# Patient Record
Sex: Female | Born: 1975
Health system: Southern US, Community
[De-identification: ages and names within clinical notes are randomized; demographics above are authoritative.]

## PROBLEM LIST (undated history)

## (undated) ENCOUNTER — Inpatient Hospital Stay (HOSPITAL_COMMUNITY): Payer: Self-pay

## (undated) DIAGNOSIS — M2040 Other hammer toe(s) (acquired), unspecified foot: Secondary | ICD-10-CM

## (undated) DIAGNOSIS — R7303 Prediabetes: Secondary | ICD-10-CM

## (undated) DIAGNOSIS — F32A Depression, unspecified: Secondary | ICD-10-CM

## (undated) DIAGNOSIS — T7840XA Allergy, unspecified, initial encounter: Secondary | ICD-10-CM

## (undated) DIAGNOSIS — G5600 Carpal tunnel syndrome, unspecified upper limb: Secondary | ICD-10-CM

## (undated) DIAGNOSIS — M069 Rheumatoid arthritis, unspecified: Secondary | ICD-10-CM

## (undated) DIAGNOSIS — G709 Myoneural disorder, unspecified: Secondary | ICD-10-CM

## (undated) DIAGNOSIS — L600 Ingrowing nail: Secondary | ICD-10-CM

## (undated) DIAGNOSIS — M1990 Unspecified osteoarthritis, unspecified site: Secondary | ICD-10-CM

## (undated) DIAGNOSIS — K219 Gastro-esophageal reflux disease without esophagitis: Secondary | ICD-10-CM

## (undated) DIAGNOSIS — N390 Urinary tract infection, site not specified: Secondary | ICD-10-CM

## (undated) DIAGNOSIS — F909 Attention-deficit hyperactivity disorder, unspecified type: Secondary | ICD-10-CM

## (undated) DIAGNOSIS — F329 Major depressive disorder, single episode, unspecified: Secondary | ICD-10-CM

## (undated) DIAGNOSIS — F419 Anxiety disorder, unspecified: Secondary | ICD-10-CM

## (undated) DIAGNOSIS — F99 Mental disorder, not otherwise specified: Secondary | ICD-10-CM

## (undated) DIAGNOSIS — I10 Essential (primary) hypertension: Secondary | ICD-10-CM

## (undated) DIAGNOSIS — E785 Hyperlipidemia, unspecified: Secondary | ICD-10-CM

## (undated) DIAGNOSIS — O480 Post-term pregnancy: Secondary | ICD-10-CM

## (undated) DIAGNOSIS — E559 Vitamin D deficiency, unspecified: Secondary | ICD-10-CM

## (undated) HISTORY — DX: Vitamin D deficiency, unspecified: E55.9

## (undated) HISTORY — PX: BREAST SURGERY: SHX581

## (undated) HISTORY — DX: Hyperlipidemia, unspecified: E78.5

## (undated) HISTORY — PX: CORRECTION HAMMER TOE: SUR317

## (undated) HISTORY — PX: TOE SURGERY: SHX1073

## (undated) HISTORY — DX: Essential (primary) hypertension: I10

## (undated) HISTORY — DX: Attention-deficit hyperactivity disorder, unspecified type: F90.9

## (undated) HISTORY — PX: DILATION AND CURETTAGE OF UTERUS: SHX78

## (undated) HISTORY — DX: Gastro-esophageal reflux disease without esophagitis: K21.9

## (undated) HISTORY — DX: Urinary tract infection, site not specified: N39.0

## (undated) HISTORY — DX: Prediabetes: R73.03

## (undated) HISTORY — DX: Allergy, unspecified, initial encounter: T78.40XA

## (undated) HISTORY — PX: CARPAL TUNNEL RELEASE: SHX101

## (undated) HISTORY — DX: Rheumatoid arthritis, unspecified: M06.9

## (undated) HISTORY — PX: OTHER SURGICAL HISTORY: SHX169

## (undated) HISTORY — PX: WISDOM TOOTH EXTRACTION: SHX21

## (undated) HISTORY — PX: HYSTEROSALPINGOGRAM: SHX6581

---

## 1998-09-23 ENCOUNTER — Other Ambulatory Visit: Admission: RE | Admit: 1998-09-23 | Discharge: 1998-09-23 | Payer: Self-pay

## 1998-10-04 ENCOUNTER — Emergency Department (HOSPITAL_COMMUNITY): Admission: EM | Admit: 1998-10-04 | Discharge: 1998-10-04 | Payer: Self-pay

## 2000-09-11 ENCOUNTER — Other Ambulatory Visit: Admission: RE | Admit: 2000-09-11 | Discharge: 2000-09-11 | Payer: Self-pay | Admitting: Emergency Medicine

## 2002-01-01 HISTORY — PX: CRYOABLATION: SHX1415

## 2003-06-29 ENCOUNTER — Other Ambulatory Visit: Admission: RE | Admit: 2003-06-29 | Discharge: 2003-06-29 | Payer: Self-pay | Admitting: Gynecology

## 2004-06-20 ENCOUNTER — Ambulatory Visit: Payer: Self-pay | Admitting: Internal Medicine

## 2004-07-28 ENCOUNTER — Ambulatory Visit (HOSPITAL_BASED_OUTPATIENT_CLINIC_OR_DEPARTMENT_OTHER): Admission: RE | Admit: 2004-07-28 | Discharge: 2004-07-28 | Payer: Self-pay | Admitting: Urology

## 2004-07-28 ENCOUNTER — Encounter (INDEPENDENT_AMBULATORY_CARE_PROVIDER_SITE_OTHER): Payer: Self-pay | Admitting: Specialist

## 2004-07-28 ENCOUNTER — Ambulatory Visit (HOSPITAL_COMMUNITY): Admission: RE | Admit: 2004-07-28 | Discharge: 2004-07-28 | Payer: Self-pay | Admitting: Urology

## 2004-07-28 HISTORY — PX: CYSTOSCOPY WITH HYDRODISTENSION AND BIOPSY: SHX5127

## 2005-01-19 ENCOUNTER — Other Ambulatory Visit: Admission: RE | Admit: 2005-01-19 | Discharge: 2005-01-19 | Payer: Self-pay | Admitting: Gynecology

## 2006-02-15 ENCOUNTER — Other Ambulatory Visit: Admission: RE | Admit: 2006-02-15 | Discharge: 2006-02-15 | Payer: Self-pay | Admitting: Gynecology

## 2007-01-20 ENCOUNTER — Encounter: Admission: RE | Admit: 2007-01-20 | Discharge: 2007-01-20 | Payer: Self-pay | Admitting: Emergency Medicine

## 2007-04-04 ENCOUNTER — Other Ambulatory Visit: Admission: RE | Admit: 2007-04-04 | Discharge: 2007-04-04 | Payer: Self-pay | Admitting: Gynecology

## 2007-04-25 ENCOUNTER — Encounter: Admission: RE | Admit: 2007-04-25 | Discharge: 2007-04-25 | Payer: Self-pay | Admitting: Gynecology

## 2007-08-21 ENCOUNTER — Emergency Department (HOSPITAL_COMMUNITY): Admission: EM | Admit: 2007-08-21 | Discharge: 2007-08-21 | Payer: Self-pay | Admitting: Emergency Medicine

## 2009-01-06 ENCOUNTER — Other Ambulatory Visit: Admission: RE | Admit: 2009-01-06 | Discharge: 2009-01-06 | Payer: Self-pay | Admitting: Gynecology

## 2009-01-06 ENCOUNTER — Ambulatory Visit: Payer: Self-pay | Admitting: Gynecology

## 2009-02-04 ENCOUNTER — Ambulatory Visit: Payer: Self-pay | Admitting: Gynecology

## 2009-03-10 ENCOUNTER — Ambulatory Visit: Payer: Self-pay | Admitting: Women's Health

## 2009-03-14 ENCOUNTER — Ambulatory Visit (HOSPITAL_COMMUNITY): Admission: AD | Admit: 2009-03-14 | Discharge: 2009-03-14 | Payer: Self-pay | Admitting: Gynecology

## 2009-03-14 ENCOUNTER — Ambulatory Visit: Payer: Self-pay | Admitting: Gynecology

## 2009-03-17 ENCOUNTER — Inpatient Hospital Stay (HOSPITAL_COMMUNITY): Admission: AD | Admit: 2009-03-17 | Discharge: 2009-03-17 | Payer: Self-pay | Admitting: Obstetrics and Gynecology

## 2009-03-21 ENCOUNTER — Inpatient Hospital Stay (HOSPITAL_COMMUNITY): Admission: AD | Admit: 2009-03-21 | Discharge: 2009-03-21 | Payer: Self-pay | Admitting: Obstetrics and Gynecology

## 2009-03-25 ENCOUNTER — Inpatient Hospital Stay (HOSPITAL_COMMUNITY): Admission: AD | Admit: 2009-03-25 | Discharge: 2009-03-25 | Payer: Self-pay | Admitting: Obstetrics and Gynecology

## 2009-03-26 ENCOUNTER — Inpatient Hospital Stay (HOSPITAL_COMMUNITY): Admission: AD | Admit: 2009-03-26 | Discharge: 2009-03-26 | Payer: Self-pay | Admitting: Obstetrics and Gynecology

## 2009-10-20 ENCOUNTER — Ambulatory Visit: Payer: Self-pay | Admitting: Gynecology

## 2009-11-04 ENCOUNTER — Ambulatory Visit (HOSPITAL_COMMUNITY)
Admission: RE | Admit: 2009-11-04 | Discharge: 2009-11-04 | Payer: Self-pay | Source: Home / Self Care | Admitting: Gynecology

## 2009-11-15 ENCOUNTER — Ambulatory Visit: Payer: Self-pay | Admitting: Gynecology

## 2009-12-09 ENCOUNTER — Ambulatory Visit: Payer: Self-pay | Admitting: Gynecology

## 2010-01-22 ENCOUNTER — Encounter: Payer: Self-pay | Admitting: Gynecology

## 2010-03-27 LAB — WET PREP, GENITAL
Clue Cells Wet Prep HPF POC: NONE SEEN
Trich, Wet Prep: NONE SEEN
Yeast Wet Prep HPF POC: NONE SEEN

## 2010-03-27 LAB — RH IMMUNE GLOBULIN WORKUP (NOT WOMEN'S HOSP)
ABO/RH(D): O NEG
Antibody Screen: NEGATIVE

## 2010-03-27 LAB — CBC
HCT: 36.3 % (ref 36.0–46.0)
HCT: 39.8 % (ref 36.0–46.0)
Hemoglobin: 12.4 g/dL (ref 12.0–15.0)
Hemoglobin: 13.6 g/dL (ref 12.0–15.0)
MCHC: 34.3 g/dL (ref 30.0–36.0)
MCHC: 34.3 g/dL (ref 30.0–36.0)
MCV: 94.1 fL (ref 78.0–100.0)
MCV: 94.6 fL (ref 78.0–100.0)
Platelets: 263 10*3/uL (ref 150–400)
Platelets: 275 10*3/uL (ref 150–400)
RBC: 3.85 MIL/uL — ABNORMAL LOW (ref 3.87–5.11)
RBC: 4.21 MIL/uL (ref 3.87–5.11)
RDW: 13 % (ref 11.5–15.5)
RDW: 13 % (ref 11.5–15.5)
WBC: 7.4 10*3/uL (ref 4.0–10.5)
WBC: 9.2 10*3/uL (ref 4.0–10.5)

## 2010-03-27 LAB — HCG, QUANTITATIVE, PREGNANCY
hCG, Beta Chain, Quant, S: 150 m[IU]/mL — ABNORMAL HIGH (ref <5)
hCG, Beta Chain, Quant, S: 299 m[IU]/mL — ABNORMAL HIGH (ref <5)
hCG, Beta Chain, Quant, S: 437 m[IU]/mL — ABNORMAL HIGH (ref <5)

## 2010-03-27 LAB — GC/CHLAMYDIA PROBE AMP, GENITAL
Chlamydia, DNA Probe: NEGATIVE
GC Probe Amp, Genital: NEGATIVE

## 2010-03-27 LAB — ABO/RH: ABO/RH(D): O NEG

## 2010-04-12 ENCOUNTER — Other Ambulatory Visit (HOSPITAL_COMMUNITY)
Admission: RE | Admit: 2010-04-12 | Discharge: 2010-04-12 | Disposition: A | Discharge status: Home / Self Care | Payer: PRIVATE HEALTH INSURANCE | Source: Ambulatory Visit | Attending: Gynecology | Admitting: Gynecology

## 2010-04-12 ENCOUNTER — Other Ambulatory Visit: Payer: Self-pay | Admitting: Gynecology

## 2010-04-12 ENCOUNTER — Encounter (INDEPENDENT_AMBULATORY_CARE_PROVIDER_SITE_OTHER): Payer: PRIVATE HEALTH INSURANCE | Admitting: Gynecology

## 2010-04-12 DIAGNOSIS — Z124 Encounter for screening for malignant neoplasm of cervix: Secondary | ICD-10-CM | POA: Insufficient documentation

## 2010-04-12 DIAGNOSIS — Z01419 Encounter for gynecological examination (general) (routine) without abnormal findings: Secondary | ICD-10-CM

## 2010-04-12 DIAGNOSIS — N949 Unspecified condition associated with female genital organs and menstrual cycle: Secondary | ICD-10-CM

## 2010-04-12 DIAGNOSIS — Z113 Encounter for screening for infections with a predominantly sexual mode of transmission: Secondary | ICD-10-CM

## 2010-04-12 DIAGNOSIS — R635 Abnormal weight gain: Secondary | ICD-10-CM

## 2010-04-12 DIAGNOSIS — Z13220 Encounter for screening for lipoid disorders: Secondary | ICD-10-CM

## 2010-04-12 DIAGNOSIS — Z833 Family history of diabetes mellitus: Secondary | ICD-10-CM

## 2010-04-12 DIAGNOSIS — R823 Hemoglobinuria: Secondary | ICD-10-CM

## 2010-05-09 ENCOUNTER — Other Ambulatory Visit: Payer: PRIVATE HEALTH INSURANCE

## 2010-05-19 NOTE — Op Note (Signed)
Melissa [name], Melissa [name]             ACCOUNT NO.:  [id number]   MEDICAL RECORD NO.:  [id number]          PATIENT TYPE:  AMB   LOCATION:  [facility]                         FACILITY:  [facility]   PHYSICIAN:  Mark C. Vernie Ammons, M.D.  DATE OF BIRTH:  1975/01/31   DATE OF PROCEDURE:  07/28/2004  DATE OF DISCHARGE:                                 OPERATIVE REPORT   PREOPERATIVE DIAGNOSIS:  Urinary frequency, rule out interstitial cystitis.   POSTOPERATIVE DIAGNOSIS:  Urinary frequency, rule out interstitial cystitis.   PROCEDURE:  Cystoscopy with urethral dilatation, hydrodistention and bladder  biopsy.   SURGEON:  Mark C. Vernie Ammons, M.D.   ANESTHESIA:  General.   BLOOD LOSS:  Minimal.   DRAINS:  None.   SPECIMENS:  Cold cup bladder biopsy to pathology.   COMPLICATIONS:  None.   INDICATIONS:  The patient is a 35 year old white female with a chief  complaint of frequent urination that has been occurring approximately every  five years. She says when her bladder is full, it is uncomfortable and she  has some discomfort in the suprapubic region and improves with urination.  She had nocturia 2-3 times. She had a puff score of 17 and also painless  microscopic hematuria. A CT scan revealed normal upper tracts and a negative  cytology. She has irritative symptoms and is a smoker and also needs to have  intravesical lesions ruled out. The risks, complications and alternatives  were discussed. She understands and elected to proceed.   DESCRIPTION OF OPERATION:  After informed consent, the patient was brought  to the major OR, placed on the table, administered general anesthesia then  moved to the dorsal lithotomy position. Her genitalia was sterilely prepped  and draped. I then performed urethral dilatation with the female sounds to  30-French. I then inserted the 21-French cystoscope, fully inspected the  bladder and noted it to be free of any tumor, stones or inflammatory  lesions. Ureteral  orifices were normal in configuration and position.   The bladder was then filled to capacity at 100 cmH2O pressure. I then  drained the bladder, the terminal effluent was clear, the bladder capacity  was 750 mL under anesthesia. I then placed a 16-French Foley catheter in the  bladder and redistended her to 100 cmH2O water pressure, left her bladder  filled at that pressure for approximately five minutes and then drained the  bladder.   I reinserted the cystoscope with the cold cup biopsy forceps and obtained a  biopsy from the posterior wall of the bladder. There were virtually no  petechial hemorrhages and minimal glomerulations noted. After the cold cup  biopsy was obtained, the Bugbee electrode was used to fulgurated the biopsy  site and the bladder was then fully drained.   A bimanual exam was performed. I noted no masses or other abnormality. Her  contraceptive hormone ring was felt in the posterior fornix. I then  instilled Marcaine and Pyridium in the bladder and injected Kenalog and  Marcaine periurethrally. A vaginal packing was inserted and the patient was  awakened and taken to recovery room in stable satisfactory condition.  She  tolerated the procedure well with no intraoperative complications.   She will be given a prescription for Vicodin E.S.(#30) and will return to my  office in four weeks for recheck.       MCO/MEDQ  D:  07/28/2004  T:  07/28/2004  Job:  [id number]

## 2010-07-28 ENCOUNTER — Telehealth: Payer: Self-pay | Admitting: *Deleted

## 2010-07-28 ENCOUNTER — Other Ambulatory Visit: Payer: Self-pay | Admitting: *Deleted

## 2010-07-28 DIAGNOSIS — N979 Female infertility, unspecified: Secondary | ICD-10-CM

## 2010-07-28 MED ORDER — CLOMIPHENE CITRATE 50 MG PO TABS: 50.0000 mg | ORAL_TABLET | Freq: Every day | ORAL | Status: AC

## 2010-07-28 NOTE — Telephone Encounter (Signed)
PT CALLED WANTING A REFILL ON AN RX THAT HER DERMATOLOGIST GAVE HER GLYCOPYRROLATE. I SUGGESTED THAT REFILL BE DONE BY DR.LUPTON BEING THAT HE PRESCRIBED THE RX.

## 2010-07-28 NOTE — Telephone Encounter (Signed)
Patient called with LMP 07/01/10, and using Clomid50mg  D5-9. Requests RF. Due for period anyday. Please advise.

## 2010-07-31 NOTE — Telephone Encounter (Signed)
Pt informed rx sent and she stated she started her period today 07/31/10.

## 2010-08-31 ENCOUNTER — Telehealth: Payer: Self-pay | Admitting: *Deleted

## 2010-08-31 NOTE — Telephone Encounter (Signed)
PT CALLED AND LEFT MESSAGE STATING SHE NEED TO KNOW HOW TO TAKE A MEDICATION? LM FOR PT TO CALL BACK.

## 2010-09-01 ENCOUNTER — Encounter: Payer: Self-pay | Admitting: *Deleted

## 2010-09-01 DIAGNOSIS — A549 Gonococcal infection, unspecified: Secondary | ICD-10-CM | POA: Insufficient documentation

## 2010-09-01 NOTE — Telephone Encounter (Signed)
AMY SPOKE TO PATIENT ABOUT CLOMID MEDICATION.

## 2010-09-05 ENCOUNTER — Encounter: Payer: Self-pay | Admitting: Gynecology

## 2010-09-05 ENCOUNTER — Ambulatory Visit (INDEPENDENT_AMBULATORY_CARE_PROVIDER_SITE_OTHER): Payer: PRIVATE HEALTH INSURANCE | Admitting: Gynecology

## 2010-09-05 VITALS — BP 118/70

## 2010-09-05 DIAGNOSIS — E789 Disorder of lipoprotein metabolism, unspecified: Secondary | ICD-10-CM

## 2010-09-05 DIAGNOSIS — B373 Candidiasis of vulva and vagina: Secondary | ICD-10-CM

## 2010-09-05 DIAGNOSIS — Z3169 Encounter for other general counseling and advice on procreation: Secondary | ICD-10-CM

## 2010-09-05 DIAGNOSIS — B3731 Acute candidiasis of vulva and vagina: Secondary | ICD-10-CM

## 2010-09-05 DIAGNOSIS — R61 Generalized hyperhidrosis: Secondary | ICD-10-CM

## 2010-09-05 DIAGNOSIS — N760 Acute vaginitis: Secondary | ICD-10-CM

## 2010-09-05 DIAGNOSIS — B9689 Other specified bacterial agents as the cause of diseases classified elsewhere: Secondary | ICD-10-CM

## 2010-09-05 DIAGNOSIS — A499 Bacterial infection, unspecified: Secondary | ICD-10-CM

## 2010-09-05 DIAGNOSIS — N898 Other specified noninflammatory disorders of vagina: Secondary | ICD-10-CM

## 2010-09-05 MED ORDER — CLINDAMYCIN PHOSPHATE 2 % VA CREA: 1.0000 | TOPICAL_CREAM | Freq: Every day | VAGINAL | Status: AC

## 2010-09-05 MED ORDER — FLUCONAZOLE 150 MG PO TABS: 150.0000 mg | ORAL_TABLET | Freq: Once | ORAL | Status: AC

## 2010-09-05 NOTE — Progress Notes (Deleted)
Lindsi here is the protocol that I want you to follow to help you  conceive as we had discussed in the office. Remember most important that the first day you start spotting that is considered the first day of your cycle and this is the day that all counting begins for the rest of the month.  First day of spotting isn't day #1 of cycle From day 5 through 9 take one Clomid tablet daily From day 12 through 16 you going to check your urine with the ovulation predictor kit twice a day the time your intercourse. When you noticed a change on the ovulation predictor kit this means that your going to ovulate in the next 24 hours which means that you are fertile during this time. In for you to have intercourse that night and the next 2 nights.  Remember to continue to take her prenatal vitamins daily as well.

## 2010-09-05 NOTE — Progress Notes (Signed)
Patient presented to the office today complaining of a vaginal yellowish-like discharge. She is in a monogamous relationship. She had a GC and Chlamydia culture done a few months ago here in the office which were negative. She has also been trying to conceive is currently on clomiphene citrate 50 mg a day 5 through 9. Patient had questions. It appears that she is not timing her intercourse or the medication or of the utilization of the ovulation predictor kit at the appropriate times has previously had been indicated. Patient also had elevated cholesterol with a value of 201 a few months ago and did not return for a fasting lipid profile until today. She also has had some night sweats at times on and off in weight gain.  Pelvic exam: Bartholin urethra Skene was within normal limits Vagina: Whitish-like discharge present Cervix: White-like discharge Bimanual exam not done  Wet prep: Moniliasis and bacterial vaginosis.  Assessment: Bacterial vaginosis and moniliasis. Weight gain, night sweats,  Plan: Cleocin vaginal cream to apply each bedtime for 5 days, Diflucan 150 mg to take one by mouth today. Fasting lipid profile and TSH today. We'll notify patient there is any abnormality. She is to follow the instructions given to her as to the timing of intercourse with prior utilization of the ovulation predictor kit as well as the clomiphene citrate. Patient fully understands the risk of the ovulation induction medication to include multiple gestation and ovarian hyperstimulation had been discussed.

## 2010-09-05 NOTE — Patient Instructions (Signed)
Tori, here R. the instructions that we discussed in the office that I would like for you to follow to help you conceive.  The first day of her cycle is considered the first date that you are spotting. Everything is dependent on this first day and will continue to count forward from this day on.  Day 5 through 9 he will take one Clomid tablet daily  From day 12 through 16 you we'll use the ovulation predictor kit in order time your intercourse. If he noticed a change on the ovulation predictor kit this means that you we'll be ovulating within the next 24 hours so you in her partner should have intercourse that day and the next couple of days.  Remember to continue to take her prenatal vitamins daily.  Will notify you about into the week if there is any abnormality on the lipid profile and your thyroid function tests that were obtained today.

## 2010-09-06 ENCOUNTER — Telehealth: Payer: Self-pay | Admitting: *Deleted

## 2010-09-06 NOTE — Telephone Encounter (Signed)
PT CALLED STATING SHE LOST RX GIVEN ON 09/05/10.

## 2010-09-07 NOTE — Telephone Encounter (Signed)
PT CALLED STATING SHE LOST HER 2 RX FROM OFFICE VISIT 09/05/10. I CALLED PHARMACY TO TELL THIS AND WAS INFORMED THAT PT WOULD HAVE TO PAY FULL PRICE FOR RX, INSURANCE WOULDN'T PAY FOR MEDICATION TWICE. LM ON PT VOICE MAIL WITH THIS INFORMATION AND FOR PT TO CALL BACK.

## 2010-09-18 ENCOUNTER — Telehealth: Payer: Self-pay | Admitting: *Deleted

## 2010-09-18 DIAGNOSIS — N390 Urinary tract infection, site not specified: Secondary | ICD-10-CM

## 2010-09-18 MED ORDER — NITROFURANTOIN MONOHYD MACRO 100 MG PO CAPS: 100.0000 mg | ORAL_CAPSULE | Freq: Two times a day (BID) | ORAL | Status: AC

## 2010-09-18 NOTE — Telephone Encounter (Signed)
Pt called stating she lost her both medication for office visit 09/05/10 diflucan and cleocin. Pt states it fell out of the car. The pharmacy said that insurance will not pay for medication again, unless we call in rx for next month. Pt also complaining of UTI frequent urination. No burning or lower back pain. LMP:08/30/10. No hysterectomy. Please advise

## 2010-09-18 NOTE — Telephone Encounter (Signed)
Message copied by Aura Camps on Mon Sep 18, 2010  3:10 PM ------      Message from: Ok Edwards      Created: Mon Sep 18, 2010  2:46 PM       Victorino Dike, please call in a prescription for Cleocin vaginal cream to apply each bedtime for 5 days. Also Diflucan 150 mg one tablet by mouth. If her urinary tract symptoms she can take Macrobid 1 tablet by mouth twice a day for 7 days. If no improvement with her symptoms of she develops any fever chills nausea vomiting or flank pain she should report to the office or after hours to the emergency room.

## 2010-09-18 NOTE — Telephone Encounter (Signed)
LM ON PT VOICE MAIL WITH THE BELOW NOTE. TOLD PT TO CALL TO SEE HOW WE SHOULD GO ABOUT THE CLEOCIN VAGINAL CREAM AND DIFLUCAN.

## 2010-11-07 ENCOUNTER — Telehealth: Payer: Self-pay | Admitting: *Deleted

## 2010-11-07 DIAGNOSIS — B3731 Acute candidiasis of vulva and vagina: Secondary | ICD-10-CM

## 2010-11-07 DIAGNOSIS — B9689 Other specified bacterial agents as the cause of diseases classified elsewhere: Secondary | ICD-10-CM

## 2010-11-07 DIAGNOSIS — N760 Acute vaginitis: Secondary | ICD-10-CM

## 2010-11-07 DIAGNOSIS — B373 Candidiasis of vulva and vagina: Secondary | ICD-10-CM

## 2010-11-07 MED ORDER — FLUCONAZOLE 150 MG PO TABS: 150.0000 mg | ORAL_TABLET | Freq: Once | ORAL | Status: AC

## 2010-11-07 MED ORDER — CLINDAMYCIN PHOSPHATE 2 % VA CREA: TOPICAL_CREAM | VAGINAL | Status: DC

## 2010-11-07 NOTE — Telephone Encounter (Signed)
Pt wanting rx called in to [facility] for her two rx cleocin vaginal cream x 5days and diflucan 150mg  1 pill 2 refill. Per jf this is okay to call in on last encounter 09/06/10.

## 2010-12-27 ENCOUNTER — Ambulatory Visit (INDEPENDENT_AMBULATORY_CARE_PROVIDER_SITE_OTHER): Payer: PRIVATE HEALTH INSURANCE | Admitting: Women's Health

## 2010-12-27 ENCOUNTER — Encounter: Payer: Self-pay | Admitting: Women's Health

## 2010-12-27 DIAGNOSIS — N898 Other specified noninflammatory disorders of vagina: Secondary | ICD-10-CM

## 2010-12-27 DIAGNOSIS — B3731 Acute candidiasis of vulva and vagina: Secondary | ICD-10-CM

## 2010-12-27 DIAGNOSIS — B373 Candidiasis of vulva and vagina: Secondary | ICD-10-CM

## 2010-12-27 MED ORDER — FLUCONAZOLE 150 MG PO TABS: 150.0000 mg | ORAL_TABLET | Freq: Once | ORAL | Status: AC

## 2010-12-27 NOTE — Progress Notes (Signed)
Patient ID: Melissa [name], female   DOB: Dec 18, 1975, 35 y.o.   MRN: [id number] Presents with a complaint of vaginal discharge with itching. Also is desiring pregnancy. Last cycle was on December 11, normal cycle. Has used Clomid in the past without success. Has used Diflucan in the past but states needs more than 1 tablet.   Exam: External genitalia is within normal limits, speculum exam cervix is pink without lesion or discharge. Scant white adherent discharge present. Bimanual uterus is small nontender no CMT. Wet prep positive for yeast.  Yeast vaginitis  Plan: Diflucan 150 by mouth daily for 3 days. Yeast prevention discussed.  Reviewed ovulation, need to increased frequency of intercourse. Return to office with missed cycle or to discuss plans for fertility management with Dr. Lily Peer.

## 2010-12-29 ENCOUNTER — Telehealth: Payer: Self-pay | Admitting: *Deleted

## 2010-12-29 MED ORDER — TERCONAZOLE 0.4 % VA CREA: 1.0000 | TOPICAL_CREAM | Freq: Every day | VAGINAL | Status: AC

## 2010-12-29 NOTE — Telephone Encounter (Signed)
Pt called c/o possible UTI s/s x 2 days now. Pt has only burning with urination, no lower back back pain nor other s/s. Pt was seen on 12/27/10 for yeast and given diflucan to take. Please advise

## 2010-12-29 NOTE — Telephone Encounter (Signed)
rx sent to pharmacy

## 2010-12-29 NOTE — Telephone Encounter (Signed)
Telephone call to patient. States most of her discomfort is  at initiation of urination, no pain at the end of the stream. Mostly burning on skin. Will try  Terazol 7 one applicator at bedtime x7 prescription will be called in. Instructed to call if symptoms persist.  [name].- please call in Terazol 7 one applicator at bedtime x7 dispense one tube, generic is okay.

## 2011-01-19 ENCOUNTER — Ambulatory Visit (INDEPENDENT_AMBULATORY_CARE_PROVIDER_SITE_OTHER): Payer: PRIVATE HEALTH INSURANCE | Admitting: Gynecology

## 2011-01-19 ENCOUNTER — Encounter: Payer: Self-pay | Admitting: Gynecology

## 2011-01-19 VITALS — BP 120/82

## 2011-01-19 DIAGNOSIS — N938 Other specified abnormal uterine and vaginal bleeding: Secondary | ICD-10-CM

## 2011-01-19 DIAGNOSIS — R635 Abnormal weight gain: Secondary | ICD-10-CM

## 2011-01-19 DIAGNOSIS — N949 Unspecified condition associated with female genital organs and menstrual cycle: Secondary | ICD-10-CM

## 2011-01-19 DIAGNOSIS — N898 Other specified noninflammatory disorders of vagina: Secondary | ICD-10-CM

## 2011-01-19 DIAGNOSIS — R61 Generalized hyperhidrosis: Secondary | ICD-10-CM

## 2011-01-19 DIAGNOSIS — Z3201 Encounter for pregnancy test, result positive: Secondary | ICD-10-CM

## 2011-01-19 LAB — WET PREP FOR TRICH, YEAST, CLUE
Clue Cells Wet Prep HPF POC: NONE SEEN
Trich, Wet Prep: NONE SEEN
Yeast Wet Prep HPF POC: NONE SEEN

## 2011-01-19 LAB — PREGNANCY, URINE: Preg Test, Ur: POSITIVE

## 2011-01-19 NOTE — Patient Instructions (Signed)
Threatened Miscarriage Bleeding during the first 20 weeks of pregnancy is common. This is sometimes called a threatened miscarriage. This is a pregnancy that is threatening to end before the twentieth week of pregnancy. Often this bleeding stops with bed rest or decreased activities as suggested by your caregiver and the pregnancy continues without any more problems. You may be asked to not have sexual intercourse, have orgasms or use tampons until further notice. Sometimes a threatened miscarriage can progress to a complete or incomplete miscarriage. This may or may not require further treatment. Some miscarriages occur before a woman misses a menstrual period and knows she is pregnant. Miscarriages occur in 15 to 20% of all pregnancies and usually occur during the first 13 weeks of the pregnancy. The exact cause of a miscarriage is usually never known. A miscarriage is natures way of ending a pregnancy that is abnormal or would not make it to term. There are some things that may put you at risk to have a miscarriage, such as:  Hormone problems.   Infection of the uterus or cervix.   Chronic illness, diabetes for example, especially if it is not controlled.   Abnormal shaped uterus.   Fibroids in the uterus.   Incompetent cervix (the cervix is too weak to hold the baby).   Smoking.   Drinking too much alcohol. It's best not to drink any alcohol when you are pregnant.   Taking illegal drugs.  TREATMENT  When a miscarriage becomes complete and all products of conception (all the tissue in the uterus) have been passed, often no treatment is needed. If you think you passed tissue, save it in a container and take it to your doctor for evaluation. If the miscarriage is incomplete (parts of the fetus or placenta remain in the uterus), further treatment may be needed. The most common reason for further treatment is continued bleeding (hemorrhage) because pregnancy tissue did not pass out of the  uterus. This often occurs if a miscarriage is incomplete. Tissue left behind may also become infected. Treatment usually is dilatation and curettage (the removal of the remaining products of pregnancy. This can be done by a simple sucking procedure (suction curettage) or a simple scraping of the inside of the uterus. This may be done in the hospital or in the caregiver's office. This is only done when your caregiver knows that there is no chance for the pregnancy to proceed to term. This is determined by physical examination, negative pregnancy test, falling pregnancy hormone count and/or, an ultrasound revealing a dead fetus. Miscarriages are often a very emotional time for prospective mothers and fathers. This is not you or your partners fault. It did not occur because of an inadequacy in you or your partner. Nearly all miscarriages occur because the pregnancy has started off wrongly. At least half of these pregnancies have a chromosomal abnormality. It is almost always not inherited. Others may have developmental problems with the fetus or placenta. This does not always show up even when the products miscarried are studied under the microscope. The miscarriage is nearly always not your fault and it is not likely that you could have prevented it from happening. If you are having emotional and grieving problems, talk to your health care provider and even seek counseling, if necessary, before getting pregnant again. You can begin trying for another pregnancy as soon as your caregiver says it is OK. HOME CARE INSTRUCTIONS   Your caregiver may order bed rest depending on how much bleeding   and cramping you are having. You may be limited to only getting up to go to the bathroom. You may be allowed to continue light activity. You may need to make arrangements for the care of your other children and for any other responsibilities.   Keep track of the number of pads you use each day, how often you have to change pads  and how saturated (soaked) they are. Record this information.   DO NOT USE TAMPONS. Do not douche, have sexual intercourse or orgasms until approved by your caregiver.   You may receive a follow up appointment for re-evaluation of your pregnancy and a repeat blood test. Re-evaluation often occurs after 2 days and again in 4 to 6 weeks. It is very important that you follow-up in the recommended time period.   If you are Rh negative and the father is Rh positive or you do not know the fathers' blood type, you may receive a shot (Rh immune globulin) to help prevent abnormal antibodies that can develop and affect the baby in any future pregnancies.  SEEK IMMEDIATE MEDICAL CARE IF:  You have severe cramps in your stomach, back, or abdomen.   You have a sudden onset of severe pain in the lower part of your abdomen.   You develop chills.   You run an unexplained temperature of 101 F (38.3 C) or higher.   You pass large clots or tissue. Save any tissue for your caregiver to inspect.   Your bleeding increases or you become light-headed, weak, or have fainting episodes.   You have a gush of fluid from your vagina.   You pass out. This could mean you have a tubal (ectopic) pregnancy.  Document Released: 12/18/2004 Document Revised: 08/30/2010 Document Reviewed: 08/04/2007 [employer] Patient Information 2012 [employer]. 

## 2011-01-19 NOTE — Progress Notes (Signed)
Patient is a 36 year old gravida 2 para 0 Ab2 (1 elective termination, one spontaneous AB first trimester) who presented to the office today stating that for the past 2 months she's been having night sweats. She stated that November 1 last normal menstrual cycle because in December she spotted for approximately 8 days in January she spotted for 10 days. She had some mild nausea but no breast tenderness no vomiting reported. She's not been using any form of contraception and is currently on prenatal vitamins. No change in sexual partner. Patient with past history of GC in February 2008.  Exam: Abdomen: Soft nontender no rebound or guarding Bartholin urethra Skene glands: Within normal limits Vagina: Old dark blood was present Cervix: No lesions or discharge or any active bleeding Uterus: 6-8 weeks size nontender Adnexa: No palpable masses or tenderness Rectal: Not done  Urine pregnancy test positive Wet prep negative  Patient has informed me that she drinks one beer daily but denies any use of drugs. She does administrative work. She denies smoking. Past history of positive GC in 2008 treated. We discussed that she is high-risk for ectopic pregnancy and for this reason we are going to get a quantitative beta-hCG today and to repeat in 72 hours (Monday) and proceed with ultrasound based on results. She was counseled to stop drinking and to continue to take her prenatal vitamins. Literature information on threatened AB provided. She should refrain from any strenuous activity and intercourse until further notice.

## 2011-01-20 LAB — HCG, QUANTITATIVE, PREGNANCY: hCG, Beta Chain, Quant, S: 1854.7 m[IU]/mL

## 2011-01-22 ENCOUNTER — Encounter (HOSPITAL_COMMUNITY): Payer: Self-pay | Admitting: *Deleted

## 2011-01-22 ENCOUNTER — Other Ambulatory Visit: Payer: Self-pay | Admitting: Gynecology

## 2011-01-22 ENCOUNTER — Other Ambulatory Visit: Payer: PRIVATE HEALTH INSURANCE

## 2011-01-22 ENCOUNTER — Inpatient Hospital Stay (HOSPITAL_COMMUNITY)
Admission: AD | Admit: 2011-01-22 | Discharge: 2011-01-22 | Disposition: A | Discharge status: Home / Self Care | Payer: PRIVATE HEALTH INSURANCE | Source: Ambulatory Visit | Attending: Obstetrics & Gynecology | Admitting: Obstetrics & Gynecology

## 2011-01-22 ENCOUNTER — Ambulatory Visit (INDEPENDENT_AMBULATORY_CARE_PROVIDER_SITE_OTHER): Payer: PRIVATE HEALTH INSURANCE

## 2011-01-22 ENCOUNTER — Ambulatory Visit (INDEPENDENT_AMBULATORY_CARE_PROVIDER_SITE_OTHER): Payer: PRIVATE HEALTH INSURANCE | Admitting: Gynecology

## 2011-01-22 DIAGNOSIS — D3910 Neoplasm of uncertain behavior of unspecified ovary: Secondary | ICD-10-CM

## 2011-01-22 DIAGNOSIS — O200 Threatened abortion: Secondary | ICD-10-CM

## 2011-01-22 DIAGNOSIS — R1903 Right lower quadrant abdominal swelling, mass and lump: Secondary | ICD-10-CM

## 2011-01-22 DIAGNOSIS — O9989 Other specified diseases and conditions complicating pregnancy, childbirth and the puerperium: Secondary | ICD-10-CM

## 2011-01-22 DIAGNOSIS — Z3201 Encounter for pregnancy test, result positive: Secondary | ICD-10-CM

## 2011-01-22 DIAGNOSIS — O00109 Unspecified tubal pregnancy without intrauterine pregnancy: Secondary | ICD-10-CM | POA: Insufficient documentation

## 2011-01-22 DIAGNOSIS — O365990 Maternal care for other known or suspected poor fetal growth, unspecified trimester, not applicable or unspecified: Secondary | ICD-10-CM

## 2011-01-22 DIAGNOSIS — O009 Unspecified ectopic pregnancy without intrauterine pregnancy: Secondary | ICD-10-CM

## 2011-01-22 DIAGNOSIS — R300 Dysuria: Secondary | ICD-10-CM

## 2011-01-22 LAB — US OB TRANSVAGINAL

## 2011-01-22 LAB — RH IG WORKUP (INCLUDES ABO/RH)
ABO/RH(D): O NEG
Antibody Screen: NEGATIVE
Gestational Age(Wks): 6
Unit division: 0

## 2011-01-22 LAB — DIFFERENTIAL
Basophils Absolute: 0 10*3/uL (ref 0.0–0.1)
Basophils Relative: 0 % (ref 0–1)
Eosinophils Absolute: 0.1 10*3/uL (ref 0.0–0.7)
Eosinophils Relative: 1 % (ref 0–5)
Lymphocytes Relative: 27 % (ref 12–46)
Lymphs Abs: 1.9 10*3/uL (ref 0.7–4.0)
Monocytes Absolute: 0.6 10*3/uL (ref 0.1–1.0)
Monocytes Relative: 8 % (ref 3–12)
Neutro Abs: 4.4 10*3/uL (ref 1.7–7.7)
Neutrophils Relative %: 63 % (ref 43–77)

## 2011-01-22 LAB — HCG, QUANTITATIVE, PREGNANCY: hCG, Beta Chain, Quant, S: 5805.3 m[IU]/mL

## 2011-01-22 LAB — URINALYSIS W MICROSCOPIC + REFLEX CULTURE
Bilirubin Urine: NEGATIVE
Casts: NONE SEEN
Crystals: NONE SEEN
Glucose, UA: NEGATIVE mg/dL
Ketones, ur: NEGATIVE mg/dL
Leukocytes, UA: NEGATIVE
Nitrite: NEGATIVE
Protein, ur: NEGATIVE mg/dL
Specific Gravity, Urine: 1.02 (ref 1.005–1.030)
Urobilinogen, UA: 0.2 mg/dL (ref 0.0–1.0)
pH: 6 (ref 5.0–8.0)

## 2011-01-22 LAB — CBC
HCT: 41.4 % (ref 36.0–46.0)
Hemoglobin: 14.7 g/dL (ref 12.0–15.0)
MCH: 31.6 pg (ref 26.0–34.0)
MCHC: 35.5 g/dL (ref 30.0–36.0)
MCV: 89 fL (ref 78.0–100.0)
Platelets: 322 10*3/uL (ref 150–400)
RBC: 4.65 MIL/uL (ref 3.87–5.11)
RDW: 12.6 % (ref 11.5–15.5)
WBC: 6.9 10*3/uL (ref 4.0–10.5)

## 2011-01-22 LAB — AST: AST: 44 U/L — ABNORMAL HIGH (ref 0–37)

## 2011-01-22 LAB — CREATININE, SERUM
Creatinine, Ser: 0.47 mg/dL — ABNORMAL LOW (ref 0.50–1.10)
GFR calc Af Amer: >90 mL/min (ref >90)
GFR calc non Af Amer: >90 mL/min (ref >90)

## 2011-01-22 LAB — BUN: BUN: 9 mg/dL (ref 6–23)

## 2011-01-22 MED ORDER — RHO D IMMUNE GLOBULIN 1500 UNIT/2ML IJ SOLN
300.0000 ug | Freq: Once | INTRAMUSCULAR | Status: AC
  Administered 2011-01-22: 300 ug via INTRAMUSCULAR
  Filled 2011-01-22: qty 2

## 2011-01-22 MED ORDER — METHOTREXATE INJECTION FOR WOMEN'S HOSPITAL
50.0000 mg/m2 | Freq: Once | INTRAMUSCULAR | Status: AC
  Administered 2011-01-22: 85 mg via INTRAMUSCULAR
  Filled 2011-01-22: qty 1.7

## 2011-01-22 NOTE — Progress Notes (Signed)
Patient states she was sent from the office for Methotrexate workup and injection. Patient states she has a little spotting but no pain. Explained the process and the time required to get results and give the injections.

## 2011-01-22 NOTE — Progress Notes (Signed)
Patient is a 36 year old gravida 2 para 0 Ab2 (1 elective termination, one spontaneous AB first trimester). Patient was seen in the office in January 18 stated she was having irregular form of bleeding for the past 2 months had a positive urine pregnancy test in the office and had a quantitative beta-hCG done that day. Her, pelvic examination was unremarkable and she was having no pain. Her quantitative beta-hCG on that day was 1854.7 and she was asked to return today for followup quantitative beta-hCG an ultrasound. Her quantitative beta-hCG had a value of 5805.3 milli-international units per mL. The ultrasound as follows  Anteverted uterus tried layered endometrium, several tiny fluid-filled areas wall of the endometrial cavity. Right ovary corpus luteum cyst measuring 24 x 23 mm with positive color flow in the periphery, located between the right ovary and wall of the uterus a solid echogenic mass measuring 16 x 13 mm with a gestational sac seen consistent with 5 weeks and 2 days, yolk sac 3 mm no fetal pole seen positive color flow in the periphery of the left ovary otherwise normal and negative cul-de-sac.  Patient was asymptomatic today with exception the brownish discharge her abdomen was soft nontender with no rebound or guarding the findings of a right ectopic pregnancy was discussed with the patient. She needs to criteria to be treated with outpatient methotrexate with serial quantitative beta-hCGs per protocol. She was sent to [facility] hospital for the following:  Day  1: 01/22/2011 CBC, SGOT, BUN, creatinine, blood type and Rh. Since patient is Rh- she will receive RhoGAM and we'll also administered methotrexate 50 mg/meter squared IM  Day 4: 01/25/2011 quantitative beta-hCG Day 7: 01/28/2011 quantitative beta-hCG a second dose of methotrexate if the declining serum beta hCG is less than 25% of day 1 level. A greater than a 25% dropping quantitative beta-hCG value from day 1 today 7 we can and  continue to follow with quadrants on a weekly basis until the values returned down to 0.  All the above was discussed with the patient in detail as well as to the risks benefits and pros and cons of methotrexate. Literature information on ectopic pregnancies and what to look out for were discussed and presented to her to read as well. All questions rancher will follow accordingly.

## 2011-01-22 NOTE — Patient Instructions (Signed)
Ectopic Pregnancy Ectopic pregnancy means the fertilized egg attached (implanted) outside the uterus. This occurs in about 1 out of 50 pregnancies. Many ectopic pregnancies occur in the fallopian tube (tube between the uterus and ovary). Ectopic pregnancies rarely occur on the ovary, intestine, pelvis, or cervix. It is the biggest cause of women dying in the first 12 weeks of their pregnancy. Death is a result of fallopian tube tearing (rupture) with severe internal bleeding. A tubal pregnancy does not have the room or blood supply to develop normally. As it grows, it stretches the walls of the tube. This causes pain. If an ectopic pregnancy ruptures through the tube, serious internal bleeding can occur, along with severe pain, fainting, and shock. This is a surgical emergency. CAUSES   Previous ectopic pregnancy.   Pelvic infection (PID, pelvic inflammatory disease).   Previous pelvic or abdominal surgery.   Uterus lining tissue growing outside the uterus (endometriosis).   Using an intrauterine device (IUD) for birth control (rare).   DES (estrogen drug) exposure when your mother was pregnant with you.   Pregnancy in women over 35 years old.   Smoking.   Infertility treatment, stimulating the ovaries to produce eggs for in vitro fertilization.   Previous fallopian tube surgery, such as reversing a tubal ligation (female sterilization, "tied tubes").   Blocked tubes.  Many times, there is no apparent cause. SYMPTOMS   The usual pregnancy symptoms:   Nausea.   Tiredness.   Breast tenderness.   Pain with intercourse.   Irregular vaginal bleeding or spotting.   Cramping or pain on one side, or in the lower abdomen.   Fast heartbeat.   Passing out while having a bowel movement.   If the tube ruptures and you develop internal bleeding, there will likely be:   Sudden, severe pain in the abdomen and pelvis.   Dizziness or fainting.   Pain in the shoulder area  (sometimes).  DIAGNOSIS   Diagnosis is made first by confirming the pregnancy with a pregnancy test.   Ultrasound.   Testing levels of pregnancy hormones and changing of the hormone levels in the blood.   Taking a sample of uterus tissue (D&C, dilation and curettage).   Visual exam inside the abdomen, using a lighted tube (laparoscopy).   Rarely, CT scan and MRI are needed.  TREATMENT   An injection of methotrexate medicine. This is given if:   The diagnosis is made early, and the pregnancy in the tube is 3.5 centimeters or less in size.   The tube has not ruptured.   Usually, pregnancy hormone blood levels are followed after this treatment, to be sure there is no pregnancy tissue left.   It takes 4 to 6 weeks for the pregnancy to be absorbed.   Surgical treatment, using a lighted tube (laparoscope), to remove the tubal pregnancy.   If severe internal bleeding occurs, a cut (incision) in the lower abdomen (laparotomy) is made, to remove the tubal pregnancy and to stop bleeding. This is a surgical emergency.   The area of the tube with the pregnancy may be removed. Sometimes, the whole tube must be removed.   After surgery, pregnancy hormone tests may be done to be sure there is no pregnancy tissue left.   If the mother has Rh-negative blood and the father is Rh-positive, you will need a RhoGAM shot to prevent Rh problems with future pregnancies.   You may need a blood transfusion (donated blood).   You may be put   on an antibiotic medicine.  HOME CARE INSTRUCTIONS   Get plenty of rest.   Avoid sexual intercourse until your caregiver says it is OK.   Avoid lifting more than 5 pounds.   Eat a well balanced diet.   Do not drink alcoholic drinks, especially while taking pain medicine.   Take all the medicines your caregiver gives you, including iron pills if you lost a lot of blood.   Do not take aspirin, because it can cause bleeding.   If you had abdominal surgery,  change your dressing as instructed.   Do not lift, over-exercise, drive, or do household chores until your caregiver gives you permission.   If you develop constipation, you may take a mild laxative recommended by your caregiver. Bran foods and drinking a lot of liquids helps with constipation.   Try to have someone help you with household chores for a week or two.   Make and keep all your follow up appointments and blood tests.   If you had methotrexate treatment:   Avoid all forms of alcohol.   Avoid vitamins with folic acid in them.   Do not take nonsteroidal anti-inflammatory drugs (NSAIDs), such as ibuprofen.   You and your partner may develop emotional problems. If so:   Grieving helps.   Counseling may be necessary.   Let your body and mind heal before trying to get pregnant again.  SEEK IMMEDIATE MEDICAL CARE IF:   You develop severe pain in the pelvis, abdomen, back, side, or shoulder, especially if you are pregnant.   You develop extreme weakness, fainting, repeated vomiting, or dehydration.   You develop heavy vaginal bleeding or passage of tissue.   You have an oral temperature above 102 F (38.9 C), not controlled by medicine.   You develop a rash or have a reaction to your medicine.   You pass out while having a bowel movement. This indicates a rupture of the tube, with internal bleeding.   You had abdominal surgery and:   There is bleeding from the incision.   There is redness, swelling, and pain around the incision.   There is pus coming out of the incision.   The incision is opening up.   You have shortness of breath.   You have chest or leg pain.   You become dizzy or pass out.   You have side effects from the methotrexate injection, including:   Nausea.   Vomiting.   Diarrhea.   Dizziness.  Document Released: 01/26/2004 Document Revised: 07/03/2010 Document Reviewed: 12/28/2008 [employer] Patient Information 2012 [employer]. 

## 2011-01-22 NOTE — ED Provider Notes (Signed)
Melissa [name] is a 36 y.o. female who presents to [facility] for treatment of Ectopic pregnancy. She was evaluated by Dr. Lily Peer in the office on 01/19/11 and her Bhcg was 1854. She returned today for follow up and a 1.6 x 1.3 cm ectopic pregnancy was identified on the right. A Bhcg was drawn in the office and results = 5805.3 per Dr.Fernandez. A blood type was also done and the patient is O negative and will need Rhogam. Dr. Lily Peer request that we draw the appropriate labs for the MTX protocol and administer MTX. He request that she return to the office to have her Bhcg repeated on day 4 and then to [facility] on day 7 since it will be the weekend.  Results for orders placed during the hospital encounter of 01/22/11 (from the past 24 hour(s))  CBC     Status: Normal   Collection Time   01/22/11  5:57 PM      Component Value Range   WBC 6.9  4.0 - 10.5 (K/uL)   RBC 4.65  3.87 - 5.11 (MIL/uL)   Hemoglobin 14.7  12.0 - 15.0 (g/dL)   HCT 16.1  09.6 - 04.5 (%)   MCV 89.0  78.0 - 100.0 (fL)   MCH 31.6  26.0 - 34.0 (pg)   MCHC 35.5  30.0 - 36.0 (g/dL)   RDW 40.9  81.1 - 91.4 (%)   Platelets 322  150 - 400 (K/uL)  DIFFERENTIAL     Status: Normal   Collection Time   01/22/11  5:57 PM      Component Value Range   Neutrophils Relative 63  43 - 77 (%)   Neutro Abs 4.4  1.7 - 7.7 (K/uL)   Lymphocytes Relative 27  12 - 46 (%)   Lymphs Abs 1.9  0.7 - 4.0 (K/uL)   Monocytes Relative 8  3 - 12 (%)   Monocytes Absolute 0.6  0.1 - 1.0 (K/uL)   Eosinophils Relative 1  0 - 5 (%)   Eosinophils Absolute 0.1  0.0 - 0.7 (K/uL)   Basophils Relative 0  0 - 1 (%)   Basophils Absolute 0.0  0.0 - 0.1 (K/uL)  AST     Status: Abnormal   Collection Time   01/22/11  5:57 PM      Component Value Range   AST 44 (*) 0 - 37 (U/L)  BUN     Status: Normal   Collection Time   01/22/11  5:57 PM      Component Value Range   BUN 9  6 - 23 (mg/dL)  CREATININE, SERUM     Status: Abnormal   Collection Time   01/22/11  5:57 PM    Component Value Range   Creatinine, Ser 0.47 (*) 0.50 - 1.10 (mg/dL)   GFR calc non Af Amer >90  >90 (mL/min)   GFR calc Af Amer >90  >90 (mL/min)  RH IG WORKUP, [facility]     Status: Normal (Preliminary result)   Collection Time   01/22/11  6:00 PM      Component Value Range   Gestational Age(Wks) 6     ABO/RH(D) O NEG     Antibody Screen NEG     Unit Number [id number]     Blood Component Type RHIG     Unit division 00     Status of Unit ALLOCATED     Transfusion Status OK TO TRANSFUSE     Assessment: Ectopic pregnancy  Plan:  MTX injection   Rhogam injection   Follow up on day 4 and day 7 and then weekly.   Ectopic precautions  Kerrie Buffalo, NP 01/22/11 2015

## 2011-01-23 ENCOUNTER — Other Ambulatory Visit: Payer: Self-pay | Admitting: Gynecology

## 2011-01-23 DIAGNOSIS — R300 Dysuria: Secondary | ICD-10-CM

## 2011-01-23 NOTE — Progress Notes (Signed)
Addended by: Ova Freshwater on: 01/23/2011 03:53 PM   Modules accepted: Orders

## 2011-01-23 NOTE — Progress Notes (Signed)
Addended by: Bertram Savin A on: 01/23/2011 12:55 PM   Modules accepted: Orders

## 2011-01-26 LAB — URINE CULTURE: Colony Count: 10000

## 2011-01-29 ENCOUNTER — Telehealth: Payer: Self-pay | Admitting: Gynecology

## 2011-01-29 ENCOUNTER — Other Ambulatory Visit: Payer: PRIVATE HEALTH INSURANCE

## 2011-01-29 ENCOUNTER — Other Ambulatory Visit: Payer: Self-pay | Admitting: Gynecology

## 2011-01-29 ENCOUNTER — Other Ambulatory Visit: Payer: Self-pay | Admitting: *Deleted

## 2011-01-29 ENCOUNTER — Telehealth: Payer: Self-pay | Admitting: *Deleted

## 2011-01-29 ENCOUNTER — Encounter (HOSPITAL_COMMUNITY): Payer: Self-pay | Admitting: *Deleted

## 2011-01-29 ENCOUNTER — Inpatient Hospital Stay (HOSPITAL_COMMUNITY)
Admission: AD | Admit: 2011-01-29 | Discharge: 2011-01-29 | Disposition: A | Discharge status: Home / Self Care | Payer: PRIVATE HEALTH INSURANCE | Source: Ambulatory Visit | Attending: Obstetrics & Gynecology | Admitting: Obstetrics & Gynecology

## 2011-01-29 DIAGNOSIS — O009 Unspecified ectopic pregnancy without intrauterine pregnancy: Secondary | ICD-10-CM | POA: Insufficient documentation

## 2011-01-29 LAB — COMPREHENSIVE METABOLIC PANEL
ALT: 61 U/L — ABNORMAL HIGH (ref 0–35)
AST: 27 U/L (ref 0–37)
Albumin: 3.6 g/dL (ref 3.5–5.2)
Alkaline Phosphatase: 60 U/L (ref 39–117)
BUN: 10 mg/dL (ref 6–23)
CO2: 27 mEq/L (ref 19–32)
Calcium: 9.6 mg/dL (ref 8.4–10.5)
Chloride: 103 mEq/L (ref 96–112)
Creatinine, Ser: 0.54 mg/dL (ref 0.50–1.10)
GFR calc Af Amer: >90 mL/min (ref >90)
GFR calc non Af Amer: >90 mL/min (ref >90)
Glucose, Bld: 92 mg/dL (ref 70–99)
Potassium: 4.5 mEq/L (ref 3.5–5.1)
Sodium: 139 mEq/L (ref 135–145)
Total Bilirubin: 0.3 mg/dL (ref 0.3–1.2)
Total Protein: 6.9 g/dL (ref 6.0–8.3)

## 2011-01-29 LAB — CBC
HCT: 35.2 % — ABNORMAL LOW (ref 36.0–46.0)
Hemoglobin: 12 g/dL (ref 12.0–15.0)
MCH: 31.3 pg (ref 26.0–34.0)
MCHC: 34.1 g/dL (ref 30.0–36.0)
MCV: 91.7 fL (ref 78.0–100.0)
Platelets: 287 10*3/uL (ref 150–400)
RBC: 3.84 MIL/uL — ABNORMAL LOW (ref 3.87–5.11)
RDW: 12.7 % (ref 11.5–15.5)
WBC: 10.3 10*3/uL (ref 4.0–10.5)

## 2011-01-29 LAB — HCG, QUANTITATIVE, PREGNANCY: hCG, Beta Chain, Quant, S: 5078.8 m[IU]/mL

## 2011-01-29 MED ORDER — METHOTREXATE INJECTION FOR WOMEN'S HOSPITAL
50.0000 mg/m2 | Freq: Once | INTRAMUSCULAR | Status: AC
  Administered 2011-01-29: 85 mg via INTRAMUSCULAR
  Filled 2011-01-29: qty 1.7

## 2011-01-29 NOTE — Progress Notes (Signed)
PT SAYS SHE WAS HERE ON 01-22-2011-  REC METHOTREXATE.   WAS IN OFFICE TODAY- HAD LABS DRAWN- SAYS DR DIDN'T LIKE RESULTS- SO SENT HER HERE.   SAYS   SUPPOSE TO GET  ANOTHER INJ OF METHOTREXATE.Marland Kitchen   SAYS STILL HAS SPOTTING- NO PAD ON NOW IN TRIAGE- ONLY WHEN WIPES- OCC IN PANTIES.   HAS A LOT OF CRAMPING-  TOOK IBUPROFEN  ON Friday- NONE TODAY.

## 2011-01-29 NOTE — Telephone Encounter (Signed)
Per Nelva Nay wrote new note already,she will call pt.

## 2011-01-29 NOTE — Progress Notes (Signed)
AFTER GAVE PT MED-  INSTR ARE TO  GO TO DR OFFICE ON Thursday FOR LABS. Marland Kitchen  PT UNDERSTANDS.

## 2011-01-29 NOTE — Telephone Encounter (Signed)
Pt called wanting to know if you could write another note for her job. Pt said you wrote one last office visit but human resources couldn't read note & stated that they need dates that pt will be out of work. Pt said dates should be jan. 21-Jan 29 Please advise

## 2011-01-29 NOTE — Telephone Encounter (Signed)
Telephone conversation with Melissa [name] today in reference to her quantitative beta-hCGs. She was diagnosed with a right ectopic pregnancy measuring 16 x 13 mm on January 21 of this year. She did not have any evidence of an acute abdomen she was hemodynamically stable and was placed on the methotrexate ectopic pregnancy protocol. Due to the fact that she is Rh- she received her RhoGAM as well as her first dose of methotrexate 50 mg/m2 as well as for baseline labs consisting a normal SGOT, BUN, creatinine. She did not return on day for or day 7 to get a quantitative beta-hCG as previously instructed. She did come today to the lab to get her quantitative beta-hCG. The following were the results from her serial quantitative beta-hCGs:  Quantitative beta-hCG 1854.7 January 18 pregnancy diagnosed  Quantitative beta-hCG 5805.3 January 21 right ectopic pregnancy diagnosed methotrexate administered day 1  Quantitative beta-hCG 5078.8 January 28 day 7  Spoke with patient on the phone today mild brownish discharge with vague cramping. We discussed that her quantitative not drop by more than 25% and for this reason she will need her second dose of the methotrexate and follow up with a quantitative beta-hCG in one week with an office visit or less she becomes symptomatic. She promised me that she will comply with a recommendation. The protocol below is the one we have been using:  Single dose protocol - The most efficient approach to medical therapy is administration of a single IM dose of MTX. Approximately 15 to 20 percent of women will require a second dose of MTX and patients should be made aware of this before starting the protocol [3,13]. Fewer than 1 percent of patients need more than two doses [3] (see 'Side effects and complications' below). In the single dose protocol (table 2), Day 1 is the day that MTX is administered [1,32]. The dose used is 50 mg per square meter of body surface area (BSA) [33]. BSA  may be calculated based upon height and weight on the day of treatment using the formula BSA = square root ((cm X kg)/3600) or a BSA calculator (calculator 1). Protocols vary slightly; choice of protocol depends on provider preference. In a commonly used protocol, on Days 4 and 7, a serum hCG concentration is drawn [7,8,34]. If the decrease in hCG between Days 4 and 7 is less than 15 percent, a second dose of MTX 50 mg/m2 IM is administered. It is common to observe an increase in hCG levels in the first several days following therapy (ie, until Day 4) [35]. This is due to continued hCG production by syncytiotrophoblast despite cessation of production by cytotrophoblast.  In our practice, we prefer to draw the second hCG level on Day 7, thereby saving the patient a visit on Day 4 [36]. Data from a study of hCG values on Days 1, 4, and 7 suggest that Day 4 serum hCG levels is not an accurate test to predict treatment success [37]. We administer a second dose of MTX if the serum hCG concentration on Day 7 has not declined by at least 25 percent from the Day 1 level, and the protocol is repeated.  If an additional dose of MTX is indicated, we do not repeat pretreatment laboratory testing (complete blood count, renal and liver function tests); there are no data suggesting that one dose of MTX changes the results of these tests. After Day 7, hCG testing is repeated weekly. On Day 14, if there is a <15 percent hCG decline  from Days 7 to 14, a third dose of MTX 50 mg/m2 IM is given. If there is a ?15 percent hCG decline from Days 7 to 14, check hCG weekly until the level is undetectable (this level varies by laboratory). The hCG concentration usually declines to less than 15 mIU/mL by 35 days post-injection, but may take as long as 109 days [13,38].

## 2011-02-01 ENCOUNTER — Encounter: Payer: Self-pay | Admitting: *Deleted

## 2011-02-01 NOTE — Progress Notes (Signed)
Patient ID: Melissa [name], female   DOB: [birth year], 36 y.o.   MRN: [id number] Pt left message asking if she could get the [facility].left message on pt vm [facility] there is a reason why he [facility]

## 2011-02-02 ENCOUNTER — Other Ambulatory Visit: Payer: Self-pay | Admitting: Gynecology

## 2011-02-02 ENCOUNTER — Telehealth: Payer: Self-pay | Admitting: Gynecology

## 2011-02-02 NOTE — Telephone Encounter (Signed)
Patient was contacted this morning to see if she was doing. Patient with a right ectopic pregnancy and has been treated as an outpatient with methotrexate. Patient is doing well at home no major complaints. She had her second dose of the methotrexate on January 28 see previous note outlining her quantitative beta-hCG values. Patient scheduled to have her next quantitative beta-hCG Monday, February 4 in the morning and then to see me on Tuesday, February 5. Patient promises to comply since there has been a compliance issue. All questions were answered and we'll follow accordingly.

## 2011-02-05 ENCOUNTER — Telehealth: Payer: Self-pay | Admitting: *Deleted

## 2011-02-05 ENCOUNTER — Other Ambulatory Visit: Payer: PRIVATE HEALTH INSURANCE

## 2011-02-05 LAB — HCG, QUANTITATIVE, PREGNANCY: hCG, Beta Chain, Quant, S: 2272.3 m[IU]/mL

## 2011-02-05 NOTE — Telephone Encounter (Signed)
Please read the below is this okay?

## 2011-02-05 NOTE — Telephone Encounter (Signed)
Message copied by Aura Camps on Mon Feb 05, 2011  2:38 PM ------      Message from: Leanor Kail      Created: Mon Feb 05, 2011  1:12 PM       Melissa [name],            Patient came in for labs today & Dr Glenetta Hew wanted her to f/up with him tomorrow for appointment before she goes to work @ 10.  Patient states she can't do Tues (tomorrow) but can do Wednesday around 12.              Could you check and see if this will be okay with Dr. Glenetta Hew?            Thanks            [phone]

## 2011-02-06 ENCOUNTER — Ambulatory Visit: Payer: PRIVATE HEALTH INSURANCE | Admitting: Gynecology

## 2011-02-06 NOTE — Telephone Encounter (Signed)
It is okay for Melissa [name] to come in for her appointment on Wednesday. I had informed all her scheduling clerk yesterday to inform her of this. Also she is to know that her quantitative beta-hCGs have decreased significantly which is a good sign.

## 2011-02-07 ENCOUNTER — Ambulatory Visit (INDEPENDENT_AMBULATORY_CARE_PROVIDER_SITE_OTHER): Payer: PRIVATE HEALTH INSURANCE | Admitting: Gynecology

## 2011-02-07 ENCOUNTER — Encounter: Payer: Self-pay | Admitting: Gynecology

## 2011-02-07 VITALS — BP 110/78

## 2011-02-07 DIAGNOSIS — N949 Unspecified condition associated with female genital organs and menstrual cycle: Secondary | ICD-10-CM

## 2011-02-07 DIAGNOSIS — B9689 Other specified bacterial agents as the cause of diseases classified elsewhere: Secondary | ICD-10-CM

## 2011-02-07 DIAGNOSIS — O009 Unspecified ectopic pregnancy without intrauterine pregnancy: Secondary | ICD-10-CM | POA: Insufficient documentation

## 2011-02-07 DIAGNOSIS — N898 Other specified noninflammatory disorders of vagina: Secondary | ICD-10-CM

## 2011-02-07 DIAGNOSIS — N760 Acute vaginitis: Secondary | ICD-10-CM

## 2011-02-07 DIAGNOSIS — A499 Bacterial infection, unspecified: Secondary | ICD-10-CM

## 2011-02-07 LAB — WET PREP FOR TRICH, YEAST, CLUE
Trich, Wet Prep: NONE SEEN
WBC, Wet Prep HPF POC: NONE SEEN
Yeast Wet Prep HPF POC: NONE SEEN

## 2011-02-07 MED ORDER — CLINDAMYCIN PHOSPHATE 2 % VA CREA: 1.0000 | TOPICAL_CREAM | Freq: Every day | VAGINAL | Status: AC

## 2011-02-07 NOTE — Progress Notes (Signed)
Patient is a 36 year old gravida 3 para 0 Ab3 (1 elective AB, one spontaneous AB and one recent right ectopic pregnancy treated with methotrexate). Patient presented to the office today for followup on her ectopic pregnancy. Patient is doing well with the exception of vaginal discharge with foul odor. She's not reporting any abdominal pains.  Quantitative beta-hCGs and methotrexate protocol as follows:  January 18: Quantitative beta-hCG 1854.7 (pregnancy diagnosed) January 21: Quantitative beta-hCG 5805.3 Right ectopic pregnancy diagnosed methotrexate administered. Day one of protocol January 28: Quantitative beta-hCG 5078.8 day 7 of protocol Second dose of methotrexate administered February 4: Quantitative beta-hCG 2272.3  Exam: Abdomen soft no rebound or guarding mildly tender throughout. Pelvic: Bartholin urethra Skene was within normal limits Vagina: Brownish fishy odor discharge Cervix: No gross lesions on inspection Uterus: Anteverted normal size shape and consistency Adnexa: No discernible masses tenderness or rebound or guarding. Rectal: Not done  Wet prep demonstrated evidence of clue cells and positive amine  Assessment/plan: Patient will be treated with Cleocin vaginal cream to apply each bedtime for 5 days for her BV. We discussed the good response on her second dose of the methotrexate whereby her quantitative beta-hCG has dropped by 50%. We'll need to continue to follow with weekly quantitative beta-hCGs until the values returned down to 0. She was instructed meanwhile to use barrier contraception. We discussed that 3 months after the clot has returned down to 0 that we could proceed with doing a hysterosalpingogram to look at the condition of her fallopian tubes. Interestingly she had an HSG that was normal on 11/04/2009. She does have a history of positive GC in February 2008 and treated. All questions were answered literature information was provided and we'll follow  accordingly. Since patient is Rh- she did receive her RhoGAM.

## 2011-02-07 NOTE — Patient Instructions (Addendum)
Remember to come in every Monday for quantitative BHCG until the values return to zero. Vaginal cream for BV in your pharmacy to pick up. Use condoms for contraception for now. 3 months after quant value reaches zero we can do the sonohysterogram:  Hysterosalpingography Hysterosalpingography is an X-ray test of the uterus and fallopian tubes. It is done to check why a woman may:  Be having trouble getting pregnant.   Keep losing her pregnancy (miscarriage).  BEFORE THE PROCEDURE  Schedule the test between day 5 and 10 of your last period. Day 1 is the first day of your period.   Ask your doctor about changing or stopping your medicines.   Tell your doctor if you may be pregnant or if you have any infections or sexually transmitted diseases (STDs).   Tell your doctor about any allergies to iodine or X-ray dye (contrast dye).   Eat and drink as normal.  PROCEDURE  You will lie down on an X-ray table with your feet in stirrups.   A thin tube will be put into the cervical canal.   X-ray dye will be put into this tube and into your uterus.   You will roll on your side as the X-rays are taken.   The tube will be taken out after the test.  AFTER THE PROCEDURE  Most of the fluid will flow out naturally.   You may have cramping and spotting. This should go away in 24 hours.   Only take medicine as told by your doctor.   Ask when your test results will be ready. Make sure you get your test results.  Document Released: 01/20/2010 Document Revised: 08/30/2010 Document Reviewed: 01/20/2010 [facility] Patient Information 2012 [place], [place].  Bacterial Vaginosis Bacterial vaginosis (BV) is a vaginal infection where the normal balance of bacteria in the vagina is disrupted. The normal balance is then replaced by an overgrowth of certain bacteria. There are several different kinds of bacteria that can cause BV. BV is the most common vaginal infection in women of childbearing age. CAUSES     The cause of BV is not fully understood. BV develops when there is an increase or imbalance of harmful bacteria.   Some activities or behaviors can upset the normal balance of bacteria in the vagina and put women at increased risk including:   Having a new sex partner or multiple sex partners.   Douching.   Using an intrauterine device (IUD) for contraception.   It is not clear what role sexual activity plays in the development of BV. However, women that have never had sexual intercourse are rarely infected with BV.  Women do not get BV from toilet seats, bedding, swimming pools or from touching objects around them.  SYMPTOMS   Grey vaginal discharge.   A fish-like odor with discharge, especially after sexual intercourse.   Itching or burning of the vagina and vulva.   Burning or pain with urination.   Some women have no signs or symptoms at all.  DIAGNOSIS  Your caregiver must examine the vagina for signs of BV. Your caregiver will perform lab tests and look at the sample of vaginal fluid through a microscope. They will look for bacteria and abnormal cells (clue cells), a pH test higher than 4.5, and a positive amine test all associated with BV.  RISKS AND COMPLICATIONS   Pelvic inflammatory disease (PID).   Infections following gynecology surgery.   Developing HIV.   Developing herpes virus.  TREATMENT  Sometimes BV  will clear up without treatment. However, all women with symptoms of BV should be treated to avoid complications, especially if gynecology surgery is planned. Female partners generally do not need to be treated. However, BV may spread between female sex partners so treatment is helpful in preventing a recurrence of BV.   BV may be treated with antibiotics. The antibiotics come in either pill or vaginal cream forms. Either can be used with nonpregnant or pregnant women, but the recommended dosages differ. These antibiotics are not harmful to the baby.   BV can  recur after treatment. If this happens, a second round of antibiotics will often be prescribed.   Treatment is important for pregnant women. If not treated, BV can cause a premature delivery, especially for a pregnant woman who had a premature birth in the past. All pregnant women who have symptoms of BV should be checked and treated.   For chronic reoccurrence of BV, treatment with a type of prescribed gel vaginally twice a week is helpful.  HOME CARE INSTRUCTIONS   Finish all medication as directed by your caregiver.   Do not have sex until treatment is completed.   Tell your sexual partner that you have a vaginal infection. They should see their caregiver and be treated if they have problems, such as a mild rash or itching.   Practice safe sex. Use condoms. Only have 1 sex partner.  PREVENTION  Basic prevention steps can help reduce the risk of upsetting the natural balance of bacteria in the vagina and developing BV:  Do not have sexual intercourse (be abstinent).   Do not douche.   Use all of the medicine prescribed for treatment of BV, even if the signs and symptoms go away.   Tell your sex partner if you have BV. That way, they can be treated, if needed, to prevent reoccurrence.  SEEK MEDICAL CARE IF:   Your symptoms are not improving after 3 days of treatment.   You have increased discharge, pain, or fever.  MAKE SURE YOU:   Understand these instructions.   Will watch your condition.   Will get help right away if you are not doing well or get worse.  FOR MORE INFORMATION  Division of STD Prevention (DSTDP), Centers for Disease Control and Prevention: [employer] American Social Health Association (ASHA): www.ashastd.org  Document Released: 12/18/2004 Document Revised: 08/30/2010 Document Reviewed: 06/10/2008 [facility] Patient Information 2012 [place], [place].

## 2011-02-07 NOTE — Telephone Encounter (Signed)
Pt has appointment 

## 2011-02-08 ENCOUNTER — Telehealth: Payer: Self-pay | Admitting: *Deleted

## 2011-02-08 MED ORDER — IBUPROFEN 800 MG PO TABS: ORAL_TABLET | ORAL | Status: DC

## 2011-02-08 NOTE — Telephone Encounter (Signed)
Pt was seen yesterday and said she forgot to ask you for a Rx for ibuprofen 800 mg for pain?

## 2011-02-08 NOTE — Telephone Encounter (Signed)
May prescribed Motrin 800 mg to take 1 by mouth 3 times a day when necessary #30 refills x3.

## 2011-02-08 NOTE — Telephone Encounter (Signed)
Pt informed, rx sent to pharmacy.

## 2011-02-09 ENCOUNTER — Telehealth: Payer: Self-pay | Admitting: *Deleted

## 2011-02-09 MED ORDER — TRAMADOL HCL ER 100 MG PO TB24: 100.0000 mg | ORAL_TABLET | Freq: Every day | ORAL | Status: DC

## 2011-02-09 NOTE — Telephone Encounter (Signed)
Patient will be prescribed Ultram 100 mg to take one by mouth every 4-6 hours when necessary #30 refill x2

## 2011-02-09 NOTE — Telephone Encounter (Signed)
Pt informed with the below note, rx sent pt pharmacy.

## 2011-02-09 NOTE — Telephone Encounter (Signed)
Pt was given motrin 800 mg as directed yesterday via telephone encounter, pt called today stating medication is not helping. She is still having back pain and cramping. Pt would like something stronger if possible. Please advise

## 2011-02-12 ENCOUNTER — Other Ambulatory Visit: Payer: PRIVATE HEALTH INSURANCE

## 2011-02-12 ENCOUNTER — Telehealth: Payer: Self-pay | Admitting: *Deleted

## 2011-02-12 LAB — HCG, QUANTITATIVE, PREGNANCY: hCG, Beta Chain, Quant, S: 572.7 m[IU]/mL

## 2011-02-12 MED ORDER — HYDROCODONE-ACETAMINOPHEN 5-500 MG PO TABS: 2.0000 | ORAL_TABLET | Freq: Four times a day (QID) | ORAL | Status: DC | PRN

## 2011-02-12 NOTE — Telephone Encounter (Signed)
We can prescribe her Lortab 5/501 by mouth every 4-6 hours when necessary #30 no refills.

## 2011-02-12 NOTE — Telephone Encounter (Signed)
Pt called today stating the Rx you sent her Friday for Ultram was too expensive for her $80. She did not get it. She wants to know if you have anything else you can give her. Thank you

## 2011-02-12 NOTE — Telephone Encounter (Signed)
Pt informed and rx sent 

## 2011-02-19 ENCOUNTER — Other Ambulatory Visit: Payer: PRIVATE HEALTH INSURANCE

## 2011-02-20 LAB — HCG, QUANTITATIVE, PREGNANCY: hCG, Beta Chain, Quant, S: 289.8 m[IU]/mL

## 2011-02-26 ENCOUNTER — Other Ambulatory Visit: Payer: PRIVATE HEALTH INSURANCE

## 2011-02-27 ENCOUNTER — Telehealth: Payer: Self-pay | Admitting: *Deleted

## 2011-02-27 NOTE — Telephone Encounter (Signed)
Pt informed with the below note, pt will make appointment. 

## 2011-02-27 NOTE — Telephone Encounter (Signed)
Message copied by Aura Camps on Tue Feb 27, 2011 11:34 AM ------      Message from: Ok Edwards      Created: Tue Feb 27, 2011 11:31 AM       Melissa [name], please call patient and reminded her that she needs to come in to get her quantitative beta-hCG like she has been doing weekly. She did not come by yesterday. Orders already on the computer. Thank you

## 2011-03-01 ENCOUNTER — Telehealth: Payer: Self-pay | Admitting: *Deleted

## 2011-03-01 ENCOUNTER — Other Ambulatory Visit: Payer: PRIVATE HEALTH INSURANCE

## 2011-03-01 NOTE — Telephone Encounter (Signed)
Spoke with pt in regarding getting HCG level drawn, pt made appointment for today at 4:00 pm

## 2011-03-02 LAB — HCG, QUANTITATIVE, PREGNANCY: hCG, Beta Chain, Quant, S: 24.5 m[IU]/mL

## 2011-03-09 ENCOUNTER — Ambulatory Visit (INDEPENDENT_AMBULATORY_CARE_PROVIDER_SITE_OTHER): Payer: PRIVATE HEALTH INSURANCE | Admitting: Gynecology

## 2011-03-09 ENCOUNTER — Encounter: Payer: Self-pay | Admitting: Gynecology

## 2011-03-09 DIAGNOSIS — O009 Unspecified ectopic pregnancy without intrauterine pregnancy: Secondary | ICD-10-CM

## 2011-03-09 DIAGNOSIS — L659 Nonscarring hair loss, unspecified: Secondary | ICD-10-CM

## 2011-03-09 DIAGNOSIS — N644 Mastodynia: Secondary | ICD-10-CM

## 2011-03-09 DIAGNOSIS — R635 Abnormal weight gain: Secondary | ICD-10-CM

## 2011-03-09 NOTE — Progress Notes (Signed)
Patient is a 36 year old gravida 3 para 0 Ab3 (1 elective AB, one spontaneous AB and one recent right ectopic pregnancy treated with methotrexate). Patient presented to the office today for followup on her ectopic pregnancy.  She's not reporting any abdominal pains. Her only complaint today with some slight left breast tenderness and some mild alopecia as a result of the methotrexate.  Quantitative beta-hCGs and methotrexate protocol as follows:  January 18: Quantitative beta-hCG 1854.7 (pregnancy diagnosed)  January 21: Quantitative beta-hCG 5805.3 Right ectopic pregnancy diagnosed methotrexate administered. Day one of protocol  January 28: Quantitative beta-hCG 5078.8 day 7 of protocol  Second dose of methotrexate administered  February 4: Quantitative beta-hCG 2272.3  February 11: Quantitative beta-hCG 572.7 February 18: Quantitative beta-hCG 289.8 February 28: Quantitative beta-hCG 24.5 March 3: Quantitative beta-hCG drawn today results pending  Exam:  Breast exam: Both breasts are symmetrical in appearance no palpable masses or tenderness no supraclavicular axillary lymphadenopathy and no nipple inversion or discharge. Abdomen soft no rebound or guarding mildly tender throughout.  Pelvic: Bartholin urethra Skene was within normal limits  Vagina: Brownish old menstrual blood  Cervix: No gross lesions on inspection  Uterus: Anteverted normal size shape and consistency  Adnexa: No discernible masses tenderness or rebound or guarding.  Rectal: Not done  Patient has responded well to the methotrexate treatment which required 2 doses. Will notify her if the quantitative beta-hCG drawn today has not reached 0 yet. She will use barrier contraception for the next 3 months before she attempts to get pregnant again. We discussed also proceeding with an HSG before she attempts a get pregnant again. She doesn't we will need to place her on antibiotic prophylactically before the procedure. Patient is  also fully aware that she has a 15-20% risk of her recurrent ectopic pregnancy. For her mastodynia she was instructed refrain from caffeine-containing products and to change her bra. Instructions were provided all questions were answered and we'll follow accordingly.

## 2011-03-09 NOTE — Patient Instructions (Signed)
Breast Tenderness Breast tenderness is a common complaint made by women of all ages. It is also called mastalgia or mastodynia, which means breast pain. The condition can range from mild discomfort to severe pain. It has a variety of causes. Your caregiver will find out the likely cause of your breast tenderness by examining your breasts, asking you about symptoms and perhaps ordering some tests. Breast tenderness usually does not mean you have breast cancer. CAUSES  Breast tenderness has many possible causes. They include:  Premenstrual changes. A week to 10 days before your period, your breasts might ache or feel tender.   Other hormonal causes. These include:   When sexual and physical traits mature (puberty).   Pregnancy.   The time right before and the year after menopause (perimenopause).   The day when it has been 12 months since your last period (menopause).   Large breasts.   Infection (also called mastitis).   Birth control pills.   Breastfeeding. Tenderness can occur if the breasts are overfull with milk or if a milk duct is blocked.   Injury.   Fibrocystic breast changes. This is not cancer (benign). It causes painful breasts that feel lumpy.   Fluid-filled sacs (cysts). Often cysts can be drained in your healthcare provider's office.   Fibroadenoma. This is a tumor that is not cancerous.   Medication side effects. Blood pressure drugs and diuretics (which increase urine flow) sometimes cause breast tenderness.   Previous breast surgery, such as a breast reduction.   Breast cancer. Cancer is rarely the reason breasts are tender. In most women, tenderness is caused by something else.  DIAGNOSIS  Several methods can be used to find out why your breasts are tender. They include:  Visual inspection of the breasts.   Examination by hand.   Tests, such as:   Mammogram.   Ultrasound.   Biopsy.   Lab test of any fluid coming from the nipple.   Blood tests.     MRI.  TREATMENT  Treatment is directed to the cause of the breast tenderness from doing nothing for minor discomfort, wearing a good support bra but also may include:  Taking over-the-counter medicines for pain or discomfort as directed by your caregiver.   Prescription medicine for breast tenderness related to:   Premenstrual.   Fibrocystic.   Puberty.   Pregnancy.   Menopause.   Previous breast surgery.   Large breasts.   Antibiotics for infection.   Birth control pills for fibrocystic and premenstrual changes.   More frequent feedings or pumping of the breasts and warm compresses for breast engorgement when nursing.   Cold and warm compresses and a good support bra for most breast injuries.   Breast cysts are sometimes drained with a needle (aspiration) or removed with minor surgery.   Fibroadenomas are usually removed with minor surgery.   Changing or stopping the medicine when it is responsible for causing the breast tenderness.   When breast cancer is present with or without causing pain, it is usually treated with major surgery (with or without radiation) and chemotherapy.  HOME CARE INSTRUCTIONS  Breast tenderness often can be handled at home. You can try:  Getting fitted for a new bra that provides more support, especially during exercise.   Wearing a more supportive or sports bra while sleeping when your breasts are very tender.   If you have a breast injury, using an ice pack for 15 to 20 minutes. Wrap the pack in a   towel. Do not put the ice pack directly on your breast.   If your breasts are too full of milk as a result of breastfeeding, try:   Expressing milk either by hand or with a breast pump.   Applying a warm compress for relief.   Taking over-the-counter pain relievers, if this is OK with your caregiver.   Taking medicine that your caregiver prescribes. These might include antibiotics or birth control pills.  Over the long term, your  breast tenderness might be eased if you:  Cut down on caffeine.   Reduce the amount of fat in your diet.  Also, learn how to do breast examinations at home. This will help you tell when you have an unusual growth or lump that could cause tenderness. And keep a log of the days and times when your breasts are most tender. This will help you and your caregiver find the right solution. SEEK MEDICAL CARE IF:   Any part of your breast is hard, red and hot to the touch. This could be a sign of infection.   Fluid is coming out of your nipples (and you are not breastfeeding). Especially watch for blood or pus.   You have a fever as well as breast tenderness.   You have a new or painful lump in your breast that remains after your period ends.   You have tried to take care of the pain at home, but it has not gone away.   Your breast pain is getting worse. Or, the pain is making it hard to do the things you usually do during your day.  Document Released: 12/01/2007 Document Revised: 12/07/2010 Document Reviewed: 12/01/2007 [employer] Patient Information 2012 [employer]. 

## 2011-03-10 LAB — HCG, QUANTITATIVE, PREGNANCY: hCG, Beta Chain, Quant, S: 3.6 m[IU]/mL

## 2011-03-13 NOTE — Progress Notes (Signed)
Addended by: Venora Maples on: 03/13/2011 12:04 PM   Modules accepted: Orders

## 2011-03-23 ENCOUNTER — Ambulatory Visit: Payer: PRIVATE HEALTH INSURANCE | Admitting: Gynecology

## 2011-04-20 ENCOUNTER — Ambulatory Visit: Payer: PRIVATE HEALTH INSURANCE | Admitting: Gynecology

## 2011-04-26 ENCOUNTER — Telehealth: Payer: Self-pay | Admitting: *Deleted

## 2011-04-26 NOTE — Telephone Encounter (Signed)
I cannot tell her over the phone Y. she bled after intercourse. It could be due from fraction due to lack of lubrication or it could be infection. She can try some Monistat over-the-counter and if it continues she definitely needs to come in to the office to get the appropriate evaluation and treatment.

## 2011-04-26 NOTE — Telephone Encounter (Signed)
Pt informed with the below note, she will make appointment when able.

## 2011-04-26 NOTE — Telephone Encounter (Signed)
Pt is calling c/o bleeding the next day after sex. Pt said she has sex on 4/1 and next day she start bleeding heavy for 3 days. No pain during sex or bleeding directly after sex. Pt has sex again on 4/23 and bleeding started yesterday like a cycle. OV offered pt said she is moving and busy at the moment.pt is unsure of LMP. Please advise

## 2011-04-27 ENCOUNTER — Encounter: Payer: Self-pay | Admitting: Gynecology

## 2011-04-27 ENCOUNTER — Ambulatory Visit: Payer: PRIVATE HEALTH INSURANCE | Admitting: Gynecology

## 2011-04-27 ENCOUNTER — Ambulatory Visit (INDEPENDENT_AMBULATORY_CARE_PROVIDER_SITE_OTHER): Payer: PRIVATE HEALTH INSURANCE | Admitting: Gynecology

## 2011-04-27 VITALS — BP 122/80

## 2011-04-27 DIAGNOSIS — N939 Abnormal uterine and vaginal bleeding, unspecified: Secondary | ICD-10-CM

## 2011-04-27 DIAGNOSIS — N926 Irregular menstruation, unspecified: Secondary | ICD-10-CM

## 2011-04-27 DIAGNOSIS — L851 Acquired keratosis [keratoderma] palmaris et plantaris: Secondary | ICD-10-CM

## 2011-04-27 DIAGNOSIS — A499 Bacterial infection, unspecified: Secondary | ICD-10-CM

## 2011-04-27 DIAGNOSIS — N898 Other specified noninflammatory disorders of vagina: Secondary | ICD-10-CM

## 2011-04-27 DIAGNOSIS — R238 Other skin changes: Secondary | ICD-10-CM

## 2011-04-27 DIAGNOSIS — B9689 Other specified bacterial agents as the cause of diseases classified elsewhere: Secondary | ICD-10-CM

## 2011-04-27 DIAGNOSIS — Z113 Encounter for screening for infections with a predominantly sexual mode of transmission: Secondary | ICD-10-CM

## 2011-04-27 DIAGNOSIS — N760 Acute vaginitis: Secondary | ICD-10-CM

## 2011-04-27 LAB — WET PREP FOR TRICH, YEAST, CLUE
Trich, Wet Prep: NONE SEEN
WBC, Wet Prep HPF POC: NONE SEEN
Yeast Wet Prep HPF POC: NONE SEEN

## 2011-04-27 MED ORDER — KETOCONAZOLE 200 MG PO TABS: 200.0000 mg | ORAL_TABLET | Freq: Every day | ORAL | Status: DC

## 2011-04-27 MED ORDER — METRONIDAZOLE 500 MG PO TABS: 500.0000 mg | ORAL_TABLET | Freq: Three times a day (TID) | ORAL | Status: AC

## 2011-04-27 NOTE — Patient Instructions (Addendum)
Remember to use condoms for next three months then we will do HSG to see if tubes are open.  Bacterial Vaginosis Bacterial vaginosis (BV) is a vaginal infection where the normal balance of bacteria in the vagina is disrupted. The normal balance is then replaced by an overgrowth of certain bacteria. There are several different kinds of bacteria that can cause BV. BV is the most common vaginal infection in women of childbearing age. CAUSES   The cause of BV is not fully understood. BV develops when there is an increase or imbalance of harmful bacteria.   Some activities or behaviors can upset the normal balance of bacteria in the vagina and put women at increased risk including:   Having a new sex partner or multiple sex partners.   Douching.   Using an intrauterine device (IUD) for contraception.   It is not clear what role sexual activity plays in the development of BV. However, women that have never had sexual intercourse are rarely infected with BV.  Women do not get BV from toilet seats, bedding, swimming pools or from touching objects around them.  SYMPTOMS   Grey vaginal discharge.   A fish-like odor with discharge, especially after sexual intercourse.   Itching or burning of the vagina and vulva.   Burning or pain with urination.   Some women have no signs or symptoms at all.  DIAGNOSIS  Your caregiver must examine the vagina for signs of BV. Your caregiver will perform lab tests and look at the sample of vaginal fluid through a microscope. They will look for bacteria and abnormal cells (clue cells), a pH test higher than 4.5, and a positive amine test all associated with BV.  RISKS AND COMPLICATIONS   Pelvic inflammatory disease (PID).   Infections following gynecology surgery.   Developing HIV.   Developing herpes virus.  TREATMENT  Sometimes BV will clear up without treatment. However, all women with symptoms of BV should be treated to avoid complications, especially  if gynecology surgery is planned. Female partners generally do not need to be treated. However, BV may spread between female sex partners so treatment is helpful in preventing a recurrence of BV.   BV may be treated with antibiotics. The antibiotics come in either pill or vaginal cream forms. Either can be used with nonpregnant or pregnant women, but the recommended dosages differ. These antibiotics are not harmful to the baby.   BV can recur after treatment. If this happens, a second round of antibiotics will often be prescribed.   Treatment is important for pregnant women. If not treated, BV can cause a premature delivery, especially for a pregnant woman who had a premature birth in the past. All pregnant women who have symptoms of BV should be checked and treated.   For chronic reoccurrence of BV, treatment with a type of prescribed gel vaginally twice a week is helpful.  HOME CARE INSTRUCTIONS   Finish all medication as directed by your caregiver.   Do not have sex until treatment is completed.   Tell your sexual partner that you have a vaginal infection. They should see their caregiver and be treated if they have problems, such as a mild rash or itching.   Practice safe sex. Use condoms. Only have 1 sex partner.  PREVENTION  Basic prevention steps can help reduce the risk of upsetting the natural balance of bacteria in the vagina and developing BV:  Do not have sexual intercourse (be abstinent).   Do not douche.  Use all of the medicine prescribed for treatment of BV, even if the signs and symptoms go away.   Tell your sex partner if you have BV. That way, they can be treated, if needed, to prevent reoccurrence.  SEEK MEDICAL CARE IF:   Your symptoms are not improving after 3 days of treatment.   You have increased discharge, pain, or fever.  MAKE SURE YOU:   Understand these instructions.   Will watch your condition.   Will get help right away if you are not doing well or  get worse.  FOR MORE INFORMATION  Division of STD Prevention (DSTDP), Centers for Disease Control and Prevention: [employer] American Social Health Association (ASHA): www.ashastd.org  Document Released: 12/18/2004 Document Revised: 12/07/2010 Document Reviewed: 06/10/2008 [facility] Patient Information 2012 [place], [place].

## 2011-04-27 NOTE — Progress Notes (Signed)
Patient is a 36 year old gravida 3 para 0 Ab3 (1 elective AB, one spontaneous AB and one recent right ectopic pregnancy treated with methotrexate). Presented to the office today stating that on April 1 she had intercourse on April 2 start her period lasted 3-4 days. And then recently 22 days later her periods started again which she's currently menstruating. She she also stated that she had a foul discharge from her vagina and wanted to have an STD screen as well. She denies any change in sexual partners. She also has suffered in the past from dry scalp and her dermatologist which is no longer the practice at prescribed her Ketoconazole 200 mg which she takes for 7 days before exercising and wanted her refill. Patient's last visit on March 8 with the following summary of her quantitative beta-hCGs:  Quantitative beta-hCGs and methotrexate protocol as follows:  January 18: Quantitative beta-hCG 1854.7 (pregnancy diagnosed)  January 21: Quantitative beta-hCG 5805.3 Right ectopic pregnancy diagnosed methotrexate administered. Day one of protocol  January 28: Quantitative beta-hCG 5078.8 day 7 of protocol  Second dose of methotrexate administered  February 4: Quantitative beta-hCG 2272.3  February 11: Quantitative beta-hCG 572.7  February 18: Quantitative beta-hCG 289.8  February 28: Quantitative beta-hCG 24.5 March 3 quantitative beta-hCG 3.6  Pelvic exam: Bartholin urethra Skene glands: Within normal limits Vagina: Menstrual blood present Cervix no gross lesions inspection GC and Chlamydia culture wet prep obtained Uterus: Anteverted normal size shape and consistency Adnexa: No palpable masses or tenderness Rectal exam: Not done  Wet prep moderate amount of 2 cells and many bacteria and positive amine  Assessment/plan: Patient typically has cycles every 25 days this is probably her menstrual cycle at this point. She did not return for final quantitative beta-hCG although it did drop as low as  3.8. She was instructed to use barrier contraception for the next 3 months. We discussed in following up with an HSG to see if there is tubal patency. Due to her past history of GC she will need to be placed on prophylaxis antibiotic before the procedure. For her bacterial vaginosis she'll be placed on Flagyl 500 mg twice a day for 5 days.

## 2011-04-28 LAB — GC/CHLAMYDIA PROBE AMP, GENITAL
Chlamydia, DNA Probe: NEGATIVE
GC Probe Amp, Genital: NEGATIVE

## 2011-05-16 ENCOUNTER — Other Ambulatory Visit (HOSPITAL_COMMUNITY): Payer: Self-pay | Admitting: Orthopedic Surgery

## 2011-05-16 DIAGNOSIS — M25539 Pain in unspecified wrist: Secondary | ICD-10-CM

## 2011-05-16 DIAGNOSIS — M542 Cervicalgia: Secondary | ICD-10-CM

## 2011-05-18 ENCOUNTER — Other Ambulatory Visit: Payer: Self-pay | Admitting: Gynecology

## 2011-05-22 ENCOUNTER — Ambulatory Visit (HOSPITAL_COMMUNITY)
Admission: RE | Admit: 2011-05-22 | Discharge: 2011-05-22 | Disposition: A | Discharge status: Home / Self Care | Payer: PRIVATE HEALTH INSURANCE | Source: Ambulatory Visit | Attending: Orthopedic Surgery | Admitting: Orthopedic Surgery

## 2011-05-22 DIAGNOSIS — M25539 Pain in unspecified wrist: Secondary | ICD-10-CM | POA: Insufficient documentation

## 2011-05-22 DIAGNOSIS — R209 Unspecified disturbances of skin sensation: Secondary | ICD-10-CM | POA: Insufficient documentation

## 2011-05-22 DIAGNOSIS — M542 Cervicalgia: Secondary | ICD-10-CM

## 2011-05-22 DIAGNOSIS — M25519 Pain in unspecified shoulder: Secondary | ICD-10-CM | POA: Insufficient documentation

## 2011-05-22 DIAGNOSIS — M25529 Pain in unspecified elbow: Secondary | ICD-10-CM | POA: Insufficient documentation

## 2011-06-22 ENCOUNTER — Ambulatory Visit: Payer: PRIVATE HEALTH INSURANCE | Admitting: Internal Medicine

## 2011-06-29 ENCOUNTER — Ambulatory Visit: Payer: PRIVATE HEALTH INSURANCE | Admitting: Gynecology

## 2011-07-06 ENCOUNTER — Ambulatory Visit (INDEPENDENT_AMBULATORY_CARE_PROVIDER_SITE_OTHER): Payer: PRIVATE HEALTH INSURANCE | Admitting: Gynecology

## 2011-07-06 ENCOUNTER — Encounter: Payer: Self-pay | Admitting: Gynecology

## 2011-07-06 VITALS — BP 120/82

## 2011-07-06 DIAGNOSIS — N760 Acute vaginitis: Secondary | ICD-10-CM

## 2011-07-06 DIAGNOSIS — B9689 Other specified bacterial agents as the cause of diseases classified elsewhere: Secondary | ICD-10-CM

## 2011-07-06 DIAGNOSIS — N898 Other specified noninflammatory disorders of vagina: Secondary | ICD-10-CM

## 2011-07-06 DIAGNOSIS — A499 Bacterial infection, unspecified: Secondary | ICD-10-CM

## 2011-07-06 LAB — WET PREP FOR TRICH, YEAST, CLUE
Trich, Wet Prep: NONE SEEN
WBC, Wet Prep HPF POC: NONE SEEN
Yeast Wet Prep HPF POC: NONE SEEN

## 2011-07-06 NOTE — Progress Notes (Signed)
Patient presented to the office today complaining of vaginal discharge with odor. Patient has had recurrent history of BV moniliasis in the past. Her last STD screen was approximately 2 months ago. She is a monogamous relationship. She is trying to get pregnant.  Exam: Pelvic: Bartholin urethra Skene was within normal limits Vagina: White foul odor discharge was noted wet prep along with GC and Chlamydia culture was obtained cervix: No gross lesions on inspection Uterus: Not examined Adnexa: Not examined Rectal: Not examined  Wet prep demonstrated evidence of clue cells along with 2 numerous to count bacteria and positive amine  Assessment/plan: Bacterial vaginosis we'll start patient on Flagyl 500 mg twice a day for 5 days. Because of patient's history of recurrent yeast and BV in the past we are going to proceed with a polymerase chain reaction (PCR) extensive testing for pathogen detection and sensitivity of resistant pathogen. We are going to be testing for different species for aerobic vaginitis as well as bacterial vaginosis and Candida vaginitis. We'll wait for the results and treat accordingly.

## 2011-07-06 NOTE — Patient Instructions (Signed)
Bacterial Vaginosis Bacterial vaginosis (BV) is a vaginal infection where the normal balance of bacteria in the vagina is disrupted. The normal balance is then replaced by an overgrowth of certain bacteria. There are several different kinds of bacteria that can cause BV. BV is the most common vaginal infection in women of childbearing age. CAUSES   The cause of BV is not fully understood. BV develops when there is an increase or imbalance of harmful bacteria.   Some activities or behaviors can upset the normal balance of bacteria in the vagina and put women at increased risk including:   Having a new sex partner or multiple sex partners.   Douching.   Using an intrauterine device (IUD) for contraception.   It is not clear what role sexual activity plays in the development of BV. However, women that have never had sexual intercourse are rarely infected with BV.  Women do not get BV from toilet seats, bedding, swimming pools or from touching objects around them.  SYMPTOMS   Grey vaginal discharge.   A fish-like odor with discharge, especially after sexual intercourse.   Itching or burning of the vagina and vulva.   Burning or pain with urination.   Some women have no signs or symptoms at all.  DIAGNOSIS  Your caregiver must examine the vagina for signs of BV. Your caregiver will perform lab tests and look at the sample of vaginal fluid through a microscope. They will look for bacteria and abnormal cells (clue cells), a pH test higher than 4.5, and a positive amine test all associated with BV.  RISKS AND COMPLICATIONS   Pelvic inflammatory disease (PID).   Infections following gynecology surgery.   Developing HIV.   Developing herpes virus.  TREATMENT  Sometimes BV will clear up without treatment. However, all women with symptoms of BV should be treated to avoid complications, especially if gynecology surgery is planned. Female partners generally do not need to be treated. However,  BV may spread between female sex partners so treatment is helpful in preventing a recurrence of BV.   BV may be treated with antibiotics. The antibiotics come in either pill or vaginal cream forms. Either can be used with nonpregnant or pregnant women, but the recommended dosages differ. These antibiotics are not harmful to the baby.   BV can recur after treatment. If this happens, a second round of antibiotics will often be prescribed.   Treatment is important for pregnant women. If not treated, BV can cause a premature delivery, especially for a pregnant woman who had a premature birth in the past. All pregnant women who have symptoms of BV should be checked and treated.   For chronic reoccurrence of BV, treatment with a type of prescribed gel vaginally twice a week is helpful.  HOME CARE INSTRUCTIONS   Finish all medication as directed by your caregiver.   Do not have sex until treatment is completed.   Tell your sexual partner that you have a vaginal infection. They should see their caregiver and be treated if they have problems, such as a mild rash or itching.   Practice safe sex. Use condoms. Only have 1 sex partner.  PREVENTION  Basic prevention steps can help reduce the risk of upsetting the natural balance of bacteria in the vagina and developing BV:  Do not have sexual intercourse (be abstinent).   Do not douche.   Use all of the medicine prescribed for treatment of BV, even if the signs and symptoms go away.     Tell your sex partner if you have BV. That way, they can be treated, if needed, to prevent reoccurrence.  SEEK MEDICAL CARE IF:   Your symptoms are not improving after 3 days of treatment.   You have increased discharge, pain, or fever.  MAKE SURE YOU:   Understand these instructions.   Will watch your condition.   Will get help right away if you are not doing well or get worse.  FOR MORE INFORMATION  Division of STD Prevention (DSTDP), Centers for Disease  Control and Prevention: www.cdc.gov/std American Social Health Association (ASHA): www.ashastd.org  Document Released: 12/18/2004 Document Revised: 12/07/2010 Document Reviewed: 06/10/2008 [employer] Patient Information 2012 [employer]. 

## 2011-07-10 ENCOUNTER — Telehealth: Payer: Self-pay | Admitting: *Deleted

## 2011-07-10 NOTE — Telephone Encounter (Signed)
Pt given name of [facility] to set up appointment.

## 2011-07-18 ENCOUNTER — Telehealth: Payer: Self-pay | Admitting: *Deleted

## 2011-07-18 NOTE — Telephone Encounter (Signed)
Patient called to c/o horrible vaginal odor.  Said hasn't gotten better since last treatment.  Also having bleeding off and on.  Patient advised to come in for office visit to eval.

## 2011-07-20 ENCOUNTER — Ambulatory Visit (INDEPENDENT_AMBULATORY_CARE_PROVIDER_SITE_OTHER): Payer: PRIVATE HEALTH INSURANCE | Admitting: Gynecology

## 2011-07-20 ENCOUNTER — Encounter: Payer: Self-pay | Admitting: Gynecology

## 2011-07-20 VITALS — BP 120/80

## 2011-07-20 DIAGNOSIS — N949 Unspecified condition associated with female genital organs and menstrual cycle: Secondary | ICD-10-CM

## 2011-07-20 DIAGNOSIS — N898 Other specified noninflammatory disorders of vagina: Secondary | ICD-10-CM

## 2011-07-20 DIAGNOSIS — N760 Acute vaginitis: Secondary | ICD-10-CM

## 2011-07-20 DIAGNOSIS — A499 Bacterial infection, unspecified: Secondary | ICD-10-CM

## 2011-07-20 DIAGNOSIS — B9689 Other specified bacterial agents as the cause of diseases classified elsewhere: Secondary | ICD-10-CM | POA: Insufficient documentation

## 2011-07-20 LAB — WET PREP FOR TRICH, YEAST, CLUE
Trich, Wet Prep: NONE SEEN
WBC, Wet Prep HPF POC: NONE SEEN
Yeast Wet Prep HPF POC: NONE SEEN

## 2011-07-20 MED ORDER — METRONIDAZOLE 500 MG PO TABS: ORAL_TABLET | ORAL | Status: DC

## 2011-07-20 MED ORDER — METRONIDAZOLE 0.75 % VA GEL: VAGINAL | Status: DC

## 2011-07-20 NOTE — Progress Notes (Signed)
Patient presented to the office today for followup and with continued vaginal discharge with odor. She was seen the office on July 5 and bacterial vaginosis was noted.Because of patient's history of recurrent yeast and BV in the past we had obtained a polymerase chain reaction (PCR) extensive testing for pathogen detection and sensitivity of resistant pathogen. We were testing for different species for aerobic vaginitis as well as bacterial vaginosis and Candida vaginitis  The results came back bacterial vaginosis associated bacteria Atopobium Vaginae and Megaspherae species Type 1 whose MicroPhor suggestive of bacterial vaginosis.  Review of her record indicated back since 2008 on and off she has had yeast and BV. She did have a history of positive GC in 2008. Today she was having similar symptoms and a repeat wet prep demonstrated bacterial vaginosis once again.  Assessment/plan: Recurrent BV we discussed the long-term treatment for at least 6 months. She'll be treated with Flagyl 500 mg twice a day for 10 days as well as her partner. Then she'll apply MetroGel intravaginally twice a week for 6 months. She is trying to get pregnant and the risks benefits and pros and cons of the medications were discussed. She will continue her prenatal vitamins as well. We discussed the ovulation predictor kit and timing of intercourse. If she does not conceive in 6 months then we will need to followup with an HSG as well as a semen analysis on her partner. She did have a history of an ectopic pregnancy early this year that was treated with methotrexate. She was instructed to continue her prenatal vitamins.

## 2011-07-20 NOTE — Patient Instructions (Addendum)
When you use the ovulation predictor kit remember that the first day of your menstrual cycle is day one. Start counting from day 1. From day 12- 16 check your urine with the ovulation predictor kit twice a day and when you notice a change this means that you will be fertile that night and next two nights so have intercourse with your partner.Continue with your daily prenatal vitamin.   Take the Flagyl twice a day for 10 days. When you finish start using the metrogel vaginal twice a week for 6 months  Your partner should take a flagyl  twice a day for 10 days as well.  Remember no alcohol!   Infertility WHAT IS INFERTILITY?  Infertility is usually defined as not being able to get pregnant after trying for one year of regular sexual intercourse without the use of contraceptives. Or not being able to carry a pregnancy to term and have a baby. The infertility rate in the [place] is around 10%. Pregnancy is the result of a chain of events. A woman must release an egg from one of her ovaries (ovulation). The egg must be fertilized by the female sperm. Then it travels through a fallopian tube into the uterus (womb), where it attaches to the wall of the uterus and grows. A man must have enough sperm, and the sperm must join with (fertilize) the egg along the way, at the proper time. The fertilized egg must then become attached to the inside of the uterus. While this may seem simple, many things can happen to prevent pregnancy from occurring.  WHOSE PROBLEM IS IT?  About 20% of infertility cases are due to problems with the man (female factors) and 65% are due to problems with the woman (female factors). Other cases are due to a combination of female and female factors or to unknown causes.  WHAT CAUSES INFERTILITY IN MEN?  Infertility in men is often caused by problems with making enough normal sperm or getting the sperm to reach the egg. Problems with sperm may exist from birth or develop later in life, due  to illness or injury. Some men produce no sperm, or produce too few sperm (oligospermia). Other problems include:  Sexual dysfunction.   Hormonal or endocrine problems.   Age. Female fertility decreases with age, but not at as young an age as female fertility.   Infection.   Congenital problems. Birth defect, such as absence of the tubes that carry the sperm (vas deferens).   Genetic/chromosomal problems.   Antisperm antibody problems.   Retrograde ejaculation (sperm go into the bladder).   Varicoceles, spematoceles, or tumors of the testicles.   Lifestyle can influence the number and quality of a man's sperm.   Alcohol and drugs can temporarily reduce sperm quality.   Environmental toxins, including pesticides and lead, may cause some cases of infertility in men.  WHAT CAUSES INFERTILITY IN WOMEN?   Problems with ovulation account for most infertility in women. Without ovulation, eggs are not available to be fertilized.   Signs of problems with ovulation include irregular menstrual periods or no periods at all.   Simple lifestyle factors, including stress, diet, or athletic training, can affect a woman's hormonal balance.   Age. Fertility begins to decrease in women in the early 80s and is worse after age 23.   Much less often, a hormonal imbalance from a serious medical problem, such as a pituitary gland tumor, thyroid or other chronic medical disease, can cause ovulation problems.  Pelvic infections.   Polycystic ovary syndrome (increase in female hormones, unable to ovulate).   Alcohol or illegal drugs.   Environmental toxins, radiation, pesticides, and certain chemicals.   Aging is an important factor in female infertility.   The ability of a woman's ovaries to produce eggs declines with age, especially after age 31. About one third of couples where the woman is over 35 will have problems with fertility.   By the time she reaches menopause when her monthly periods  stop for good, a woman can no longer produce eggs or become pregnant.   Other problems can also lead to infertility in women. If the fallopian tubes are blocked at one or both ends, the egg cannot travel through the tubes into the uterus. Scar tissue (adhesions) in the pelvis may cause blocked tubes. This may result from pelvic inflammatory disease, endometriosis, or surgery for an ectopic pregnancy (fertilized egg implanted outside the uterus) or any pelvic or abdominal surgery causing adhesions.   Fibroid tumors or polyps of the uterus.   Congenital (birth defect) abnormalities of the uterus.   Infection of the cervix (cervicitis).   Cervical stenosis (narrowing).   Abnormal cervical mucus.   Polycystic ovary syndrome.   Having sexual intercourse too often (every other day or 4 to 5 times a week).   Obesity.   Anorexia.   Poor nutrition.   Over exercising, with loss of body fat.   DES. Your mother received diethylstilbesterol hormone when pregnant with you.  HOW IS INFERTILITY TESTED?  If you have been trying to have a baby without success, you may want to seek medical help. You should not wait for one year of trying before seeing a health care provider if:  You are over 35.   You have reason to believe that there may be a fertility problem.  A medical evaluation may determine the reasons for a couple's infertility. Usually this process begins with:  Physical exams.   Medical histories of both partners.   Sexual histories of both partners.  If there is no obvious problem, like improperly timed intercourse or absence of ovulation, tests may be needed.   For a man, testing usually begins with tests of his semen to look at:   The number of sperm.   The shape of sperm.   Movement of his sperm.   Taking a complete medical and surgical history.   Physical examination.   Check for infection of the female reproductive organs.  Sometimes hormone tests are done.   For a  woman, the first step in testing is to find out if she is ovulating each month. There are several ways to do this. For example, she can keep track of changes in her morning body temperature and in the texture of her cervical mucus. Another tool is a home ovulation test kit, which can be bought at drug or grocery stores.   Checks of ovulation can also be done in the doctor's office, using blood tests for hormone levels or ultrasound tests of the ovaries. If the woman is ovulating, more tests will need to be done. Some common female tests include:   Hysterosalpingogram: An x-ray of the fallopian tubes and uterus after they are injected with dye. It shows if the tubes are open and shows the shape of the uterus.   Laparoscopy: An exam of the tubes and other female organs for disease. A lighted tube called a laparoscope is used to see inside the abdomen.  Endometrial biopsy: Sample of uterus tissue taken on the first day of the menstrual period, to see if the tissue indicates you are ovulating.   Transvaginal ultrasound: Examines the female organs.   Hysteroscopy: Uses a lighted tube to examine the cervix and inside the uterus, to see if there are any abnormalities inside the uterus.  TREATMENT  Depending on the test results, different treatments can be suggested. The type of treatment depends on the cause. 85 to 90% of infertility cases are treated with drugs or surgery.   Various fertility drugs may be used for women with ovulation problems. It is important to talk with your caregiver about the drug to be used. You should understand the drug's benefits and side effects. Depending on the type of fertility drug and the dosage of the drug used, multiple births (twins or multiples) can occur in some women.   If needed, surgery can be done to repair damage to a woman's ovaries, fallopian tubes, cervix, or uterus.   Surgery or medical treatment for endometriosis or polycystic ovary syndrome. Sometimes a  man has an infertility problem that can be corrected with medicine or by surgery.   Intrauterine insemination (IUI) of sperm, timed with ovulation.   Change in lifestyle, if that is the cause (lose weight, increase exercise, and stop smoking, drinking excessively, or taking illegal drugs).   Other types of surgery:   Removing growths inside and on the uterus.   Removing scar tissue from inside of the uterus.   Fixing blocked tubes.   Removing scar tissue in the pelvis and around the female organs.  WHAT IS ASSISTED REPRODUCTIVE TECHNOLOGY (ART)?  Assisted reproductive technology (ART) is another form of special methods used to help infertile couples. ART involves handling both the woman's eggs and the man's sperm. Success rates vary and depend on many factors. ART can be expensive and time-consuming. But ART has made it possible for many couples to have children that otherwise would not have been conceived. Some methods are listed below:  In vitro fertilization (IVF). IVF is often used when a woman's fallopian tubes are blocked or when a man has low sperm counts. A drug is used to stimulate the ovaries to produce multiple eggs. Once mature, the eggs are removed and placed in a culture dish with the man's sperm for fertilization. After about 40 hours, the eggs are examined to see if they have become fertilized by the sperm and are dividing into cells. These fertilized eggs (embryos) are then placed in the woman's uterus. This bypasses the fallopian tubes.   Gamete intrafallopian transfer (GIFT) is similar to IVF, but used when the woman has at least one normal fallopian tube. Three to five eggs are placed in the fallopian tube, along with the man's sperm, for fertilization inside the woman's body.   Zygote intrafallopian transfer (ZIFT), also called tubal embryo transfer, combines IVF and GIFT. The eggs retrieved from the woman's ovaries are fertilized in the lab and placed in the fallopian  tubes rather than in the uterus.   ART procedures sometimes involve the use of donor eggs (eggs from another woman) or previously frozen embryos. Donor eggs may be used if a woman has impaired ovaries or has a genetic disease that could be passed on to her baby.   When performing ART, you are at higher risk for resulting in multiple pregnancies, twins, triplets or more.   Intracytoplasma sperm injection is a procedure that injects a single sperm into the egg  to fertilize it.   Embryo transplant is a procedure that starts after growing an embryo in a special media (chemical solution) developed to keep the embryo alive for 2 to 5 days, and then transplanting it into the uterus.  In cases where a cause cannot be found and pregnancy does not occur, adoption may be a consideration. Document Released: 12/21/2002 Document Revised: 12/07/2010 Document Reviewed: 11/16/2008 [facility] Patient Information 2012 [place], [place].

## 2011-07-23 ENCOUNTER — Telehealth: Payer: Self-pay | Admitting: *Deleted

## 2011-07-23 MED ORDER — NITROFURANTOIN MONOHYD MACRO 100 MG PO CAPS: 100.0000 mg | ORAL_CAPSULE | Freq: Two times a day (BID) | ORAL | Status: AC

## 2011-07-23 MED ORDER — URIBEL 118 MG PO CAPS: 118.0000 mg | ORAL_CAPSULE | Freq: Four times a day (QID) | ORAL | Status: DC

## 2011-07-23 NOTE — Telephone Encounter (Signed)
Pt calling c/o urination urgency x 2 days now. Pt was seen on 07/20/11 for vaginal odor. No burning or other symptoms. Please advise

## 2011-07-23 NOTE — Telephone Encounter (Signed)
Pt informed with the below note, rx sent to pharmacy. 

## 2011-07-23 NOTE — Telephone Encounter (Signed)
Please call in Macrobid one by mouth twice a day for 7 days along with Uribell one by mouth 4 times a day for 2 days.

## 2011-07-30 ENCOUNTER — Encounter: Payer: Self-pay | Admitting: Gynecology

## 2011-09-21 ENCOUNTER — Ambulatory Visit (INDEPENDENT_AMBULATORY_CARE_PROVIDER_SITE_OTHER): Payer: PRIVATE HEALTH INSURANCE | Admitting: Gynecology

## 2011-09-21 ENCOUNTER — Encounter: Payer: Self-pay | Admitting: Gynecology

## 2011-09-21 VITALS — BP 120/82

## 2011-09-21 DIAGNOSIS — N898 Other specified noninflammatory disorders of vagina: Secondary | ICD-10-CM

## 2011-09-21 DIAGNOSIS — F411 Generalized anxiety disorder: Secondary | ICD-10-CM

## 2011-09-21 DIAGNOSIS — F329 Major depressive disorder, single episode, unspecified: Secondary | ICD-10-CM

## 2011-09-21 DIAGNOSIS — F3289 Other specified depressive episodes: Secondary | ICD-10-CM

## 2011-09-21 DIAGNOSIS — Z113 Encounter for screening for infections with a predominantly sexual mode of transmission: Secondary | ICD-10-CM

## 2011-09-21 DIAGNOSIS — F419 Anxiety disorder, unspecified: Secondary | ICD-10-CM

## 2011-09-21 DIAGNOSIS — R635 Abnormal weight gain: Secondary | ICD-10-CM

## 2011-09-21 DIAGNOSIS — F32A Depression, unspecified: Secondary | ICD-10-CM

## 2011-09-21 LAB — COMPREHENSIVE METABOLIC PANEL
ALT: 14 U/L (ref 0–35)
AST: 20 U/L (ref 0–37)
Albumin: 4.6 g/dL (ref 3.5–5.2)
Alkaline Phosphatase: 60 U/L (ref 39–117)
BUN: 9 mg/dL (ref 6–23)
CO2: 25 mEq/L (ref 19–32)
Calcium: 9.7 mg/dL (ref 8.4–10.5)
Chloride: 99 mEq/L (ref 96–112)
Creat: 0.61 mg/dL (ref 0.50–1.10)
Glucose, Bld: 81 mg/dL (ref 70–99)
Potassium: 4 mEq/L (ref 3.5–5.3)
Sodium: 135 mEq/L (ref 135–145)
Total Bilirubin: 0.5 mg/dL (ref 0.3–1.2)
Total Protein: 7.7 g/dL (ref 6.0–8.3)

## 2011-09-21 LAB — CBC WITH DIFFERENTIAL/PLATELET
Basophils Absolute: 0 10*3/uL (ref 0.0–0.1)
Basophils Relative: 0 % (ref 0–1)
Eosinophils Absolute: 0 10*3/uL (ref 0.0–0.7)
Eosinophils Relative: 0 % (ref 0–5)
HCT: 43.7 % (ref 36.0–46.0)
Hemoglobin: 15 g/dL (ref 12.0–15.0)
Lymphocytes Relative: 17 % (ref 12–46)
Lymphs Abs: 1.9 10*3/uL (ref 0.7–4.0)
MCH: 32 pg (ref 26.0–34.0)
MCHC: 34.3 g/dL (ref 30.0–36.0)
MCV: 93.2 fL (ref 78.0–100.0)
Monocytes Absolute: 0.4 10*3/uL (ref 0.1–1.0)
Monocytes Relative: 4 % (ref 3–12)
Neutro Abs: 8.8 10*3/uL — ABNORMAL HIGH (ref 1.7–7.7)
Neutrophils Relative %: 79 % — ABNORMAL HIGH (ref 43–77)
Platelets: 266 10*3/uL (ref 150–400)
RBC: 4.69 MIL/uL (ref 3.87–5.11)
RDW: 12.8 % (ref 11.5–15.5)
WBC: 11.2 10*3/uL — ABNORMAL HIGH (ref 4.0–10.5)

## 2011-09-21 LAB — WET PREP FOR TRICH, YEAST, CLUE
Trich, Wet Prep: NONE SEEN
Yeast Wet Prep HPF POC: NONE SEEN

## 2011-09-21 MED ORDER — VENLAFAXINE HCL 37.5 MG PO TABS: 37.5000 mg | ORAL_TABLET | Freq: Two times a day (BID) | ORAL | Status: DC

## 2011-09-21 NOTE — Patient Instructions (Signed)
Depression You have signs of depression. This is a common problem. It can occur at any age. It is often hard to recognize. People can suffer from depression and still have moments of enjoyment. Depression interferes with your basic ability to function in life. It upsets your relationships, sleep, eating, and work habits. CAUSES  Depression is believed to be caused by an imbalance in brain chemicals. It may be triggered by an unpleasant event. Relationship crises, a death in the family, financial worries, retirement, or other stressors are normal causes of depression. Depression may also start for no known reason. Other factors that may play a part include medical illnesses, some medicines, genetics, and alcohol or drug abuse. SYMPTOMS   Feeling unhappy or worthless.   Long-lasting (chronic) tiredness or worn-out feeling.   Self-destructive thoughts and actions.   Not being able to sleep or sleeping too much.   Eating more than usual or not eating at all.   Headaches or feeling anxious.   Trouble concentrating or making decisions.   Unexplained physical problems and substance abuse.  TREATMENT  Depression usually gets better with treatment. This can include:  Antidepressant medicines. It can take weeks before the proper dose is achieved and benefits are reached.   Talking with a therapist, clergyperson, counselor, or friend. These people can help you gain insight into your problem and regain control of your life.   Eating a good diet.   Getting regular physical exercise, such as walking for 30 minutes every day.   Not abusing alcohol or drugs.  Treating depression often takes 6 months or longer. This length of treatment is needed to keep symptoms from returning. Call your caregiver and arrange for follow-up care as suggested. SEEK IMMEDIATE MEDICAL CARE IF:   You start to have thoughts of hurting yourself or others.   Call your local emergency services (911 in [place]   Go to  your local medical emergency department.   Call the National Suicide Prevention Lifeline: 1-800-273-TALK (628)300-8663).  Document Released: 12/18/2004 Document Revised: 12/07/2010 Document Reviewed: 05/20/2009 [facility] Patient Information 2012 [place], [place].  Anxiety and Panic Attacks Anxiety is your body's way of reacting to real danger or something you think is a danger. It may be fear or worry over a situation like losing your job. Sometimes the cause is not known. A panic attack is made up of physical signs like sweating, shaking, or chest pain. Anxiety and panic attacks may start suddenly. They may be strong. They may come at any time of day, even while sleeping. They may come at any time of life. Panic attacks are scary, but they do not harm you physically.  HOME CARE  Avoid any known causes of your anxiety.   Try to relax. Yoga may help. Tell yourself everything will be okay.   Exercise often.   Get expert advice and help (therapy) to stop anxiety or attacks from happening.   Avoid caffeine, alcohol, and drugs.   Only take medicine as told by your doctor.  GET HELP RIGHT AWAY IF:  Your attacks seem different than normal attacks.   Your problems are getting worse or concern you.  MAKE SURE YOU:  Understand these instructions.   Will watch your condition.   Will get help right away if you are not doing well or get worse.  Document Released: 01/20/2010 Document Revised: 12/07/2010 Document Reviewed: 01/20/2010 [facility] Patient Information 2012 [place], [place].

## 2011-09-21 NOTE — Progress Notes (Addendum)
Patient presented to the office today with much emotionally distraught. Patient states that she has night sweats and the lowest being to make her cry. She feels depressed that time. She has good family support. She denies any suicidal deviation. Last year she said that she saw a therapist. She did not want to go any type of medication. She had an ectopic pregnancy that was treated medically this year. She's had long-standing history of recurrent bacterial vaginosis as follows:  History of recurrent yeast and BV in the past we had obtained a polymerase chain reaction (PCR) extensive testing for pathogen detection and sensitivity of resistant pathogen. We were testing for different species for aerobic vaginitis as well as bacterial vaginosis and Candida vaginitis  The results came back bacterial vaginosis associated bacteria Atopobium Vaginae and Megaspherae species Type 1 whose MicroPhor suggestive of bacterial vaginosis.  Review of her record indicated back since 2008 on and off she has had yeast and BV. She did have a history of positive GC in 2008.  Patient document had a little vaginal discharge today. Her wet prep was essentially negative a little but a bacteria rare clue cells. She is currently on the following regimen: MetroGel intravaginally twice a week for 6 month course.  Her cycles are regular.  Pelvic exam: Bartholin urethra Skene was within normal limits Vagina: No lesions or discharge Cervix: No lesions or discharge   Assessment/plan: Patient on long-term treatment for recurrent BV. She was reassured 6 day course. As to her depression and anxiety I'm going to start her on Effexor extended release 37.5 mg daily. The risks benefits and pros and cons were discussed. I'm going to refer her to the therapist for continuation of therapy. We'll draw the following labs today: TSH, complete  metabolic panel, CBC. GC and Chlamydia culture pending at time of this dictation. We'll monitor her  symptoms of the course of the next 6 months as well. Literature information on depression and anxiety was provided as well.

## 2011-09-21 NOTE — Addendum Note (Signed)
Addended by: Ok Edwards on: 09/21/2011 04:11 PM   Modules accepted: Orders

## 2011-09-22 LAB — GC/CHLAMYDIA PROBE AMP, GENITAL
Chlamydia, DNA Probe: NEGATIVE
GC Probe Amp, Genital: NEGATIVE

## 2011-09-22 LAB — TSH: TSH: 0.993 u[IU]/mL (ref 0.350–4.500)

## 2011-09-24 ENCOUNTER — Telehealth: Payer: Self-pay | Admitting: *Deleted

## 2011-09-24 NOTE — Telephone Encounter (Signed)
Message copied by Aura Camps on Mon Sep 24, 2011 12:47 PM ------      Message from: Ok Edwards      Created: Fri Sep 21, 2011  2:37 PM       Please make appointment with therapist Berniece Andreas for this patient

## 2011-09-24 NOTE — Telephone Encounter (Signed)
Number given to pt to make appointment.

## 2011-09-25 ENCOUNTER — Ambulatory Visit (INDEPENDENT_AMBULATORY_CARE_PROVIDER_SITE_OTHER): Payer: PRIVATE HEALTH INSURANCE | Admitting: Gynecology

## 2011-09-25 ENCOUNTER — Encounter: Payer: Self-pay | Admitting: Gynecology

## 2011-09-25 VITALS — BP 120/82

## 2011-09-25 DIAGNOSIS — N644 Mastodynia: Secondary | ICD-10-CM

## 2011-09-25 DIAGNOSIS — N393 Stress incontinence (female) (male): Secondary | ICD-10-CM

## 2011-09-25 DIAGNOSIS — N3281 Overactive bladder: Secondary | ICD-10-CM

## 2011-09-25 DIAGNOSIS — N318 Other neuromuscular dysfunction of bladder: Secondary | ICD-10-CM

## 2011-09-25 DIAGNOSIS — D72829 Elevated white blood cell count, unspecified: Secondary | ICD-10-CM

## 2011-09-25 DIAGNOSIS — Z113 Encounter for screening for infections with a predominantly sexual mode of transmission: Secondary | ICD-10-CM

## 2011-09-25 LAB — CBC WITH DIFFERENTIAL/PLATELET
Basophils Absolute: 0 10*3/uL (ref 0.0–0.1)
Basophils Relative: 1 % (ref 0–1)
Eosinophils Absolute: 0.1 10*3/uL (ref 0.0–0.7)
Eosinophils Relative: 1 % (ref 0–5)
HCT: 38.4 % (ref 36.0–46.0)
Hemoglobin: 13.7 g/dL (ref 12.0–15.0)
Lymphocytes Relative: 30 % (ref 12–46)
Lymphs Abs: 1.9 10*3/uL (ref 0.7–4.0)
MCH: 31.9 pg (ref 26.0–34.0)
MCHC: 35.7 g/dL (ref 30.0–36.0)
MCV: 89.3 fL (ref 78.0–100.0)
Monocytes Absolute: 0.6 10*3/uL (ref 0.1–1.0)
Monocytes Relative: 9 % (ref 3–12)
Neutro Abs: 3.8 10*3/uL (ref 1.7–7.7)
Neutrophils Relative %: 59 % (ref 43–77)
Platelets: 283 10*3/uL (ref 150–400)
RBC: 4.3 MIL/uL (ref 3.87–5.11)
RDW: 12.4 % (ref 11.5–15.5)
WBC: 6.3 10*3/uL (ref 4.0–10.5)

## 2011-09-25 LAB — HIV ANTIBODY (ROUTINE TESTING W REFLEX): HIV: NONREACTIVE

## 2011-09-25 LAB — HEPATITIS B SURFACE ANTIGEN: Hepatitis B Surface Ag: NEGATIVE

## 2011-09-25 LAB — HEPATITIS C ANTIBODY: HCV Ab: NEGATIVE

## 2011-09-25 MED ORDER — FLUCONAZOLE 100 MG PO TABS: ORAL_TABLET | ORAL | Status: DC

## 2011-09-25 NOTE — Patient Instructions (Addendum)
Urinary Incontinence Your doctor wants you to have this information about urinary incontinence. This is the inability to keep urine in your body until you decide to release it. CAUSES  Prostate gland enlargement is a common cause of urinary incontinence. But there are many different causes for losing urinary control. They include:  Medicines.   Infections.   Prostate problems.   Surgery.   Neurological diseases.   Emotional factors.  DIAGNOSIS  Evaluating the cause of incontinence is important in choosing the best treatment. This may require:  An ultrasound exam.   Kidney and bladder X-rays.   Cystoscopy. This is an exam of the bladder using a narrow scope.  TREATMENT  For incontinent patients, normal daily hygiene and using changing pads or adult diapers regularly will prevent offensive odors and skin damage from the moisture. Changing your medicines may help control incontinence. Your caregiver may prescribe some medicines to help you regain control. Avoid caffeine. It can over-stimulate the bladder. Use the bathroom regularly. Try about every 2 to 3 hours even if you do not feel the need. Take time to empty your bladder completely. After urinating, wait a minute. Then try to urinate again. External devices used to catch urine or an indwelling urine catheter (Foley catheter) may be needed as well. Some prostate gland problems require surgery to correct. Call your caregiver for more information. Document Released: 01/26/2004 Document Revised: 12/07/2010 Document Reviewed: 01/21/2008 [facility] Patient Information 2012 [place],

## 2011-09-25 NOTE — Progress Notes (Signed)
Patient presented to the office complaining of left breast pain for a few days and also complaining of leakage of urine when she last or sneeze her times. She does state that time she has frequency and nocturia. She's currently on a six-month treatment for chronic bacterial vaginosis consisting of MetroGel twice a week. Patient presented to the office on September 20 emotionally distraught complaining of night sweats and minimal things causing her to cry. She felt the present time although she had good family support. She denied any suicidal ideation. Please see previous note for details. We drawn a TSH, competence metabolic panel CBC, GC and Chlamydia culture which were all normal with the exception her white blood count was slightly elevated at 11.2 for which she was here to repeat today. Patient also wanted to be checked for HIV as well as hepatitis and syphilis today.  Exam: Both breasts were examined sitting supine position left breast is very tender in the upper-outer quadrant and towards the middle portion near the sternum. No discernible mass per se. No supraclavicular axillary lymphadenopathy. Contralateral breast was normal.  She was examined also in sitting and supine position for evidence of stress urinary incontinence. Pelvic exam: Bartholin urethra Skene glands within normal limits Q-tip angle tests less than 30 Vagina: No lesions or discharge no cystocele no rectocele no evidence of uterine prolapse Cervix: No lesions or discharge Uterus: Anteverted normal size shape and consistency Adnexa: No palpable masses or tenderness Rectal exam not done  On Valsalva maneuver in the supine or erect position and after coughing patient did not leak urine.  Assessment/plan: Patient with very tender left breast in the upper left outer quadrant will send for diagnostic mammogram. Patient would know family history of breast cancer. We'll repeat her CBC due to her mild leukocytosis 4 days ago. As per  her request we'll order an HIV, RPR, hepatitis B and C. She is recently started on the Effexor 37.5 mg daily for depression and anxiety. She has a she will need to discontinue this medication if she conceives since she's not using any form of contraception. The same goes for the Diflucan 100 mg prescribed to her in the event of any vaginal itching which could be superimposed yeast infection since she is been taking MetroGel vaginally twice a week for recurrent BV.

## 2011-09-26 ENCOUNTER — Telehealth: Payer: Self-pay | Admitting: *Deleted

## 2011-09-26 DIAGNOSIS — N644 Mastodynia: Secondary | ICD-10-CM

## 2011-09-26 LAB — RPR

## 2011-09-26 NOTE — Telephone Encounter (Signed)
Message copied by Aura Camps on Wed Sep 26, 2011  9:01 AM ------      Message from: Ok Edwards      Created: Tue Sep 25, 2011  6:42 PM       Melissa [name], this patient needs a left breast diagnostic mammogram as a result of painful left upper outer quadrant breast and medial aspect of her left breast parasternal region. Thank you

## 2011-09-26 NOTE — Telephone Encounter (Signed)
Appointment 10/05/11 @ 1:00 pm

## 2011-09-26 NOTE — Telephone Encounter (Signed)
Order placed diag. mammo.

## 2011-09-27 ENCOUNTER — Ambulatory Visit: Payer: PRIVATE HEALTH INSURANCE | Admitting: Gynecology

## 2011-10-17 ENCOUNTER — Ambulatory Visit: Payer: PRIVATE HEALTH INSURANCE | Admitting: Licensed Clinical Social Worker

## 2011-10-24 ENCOUNTER — Encounter: Payer: Self-pay | Admitting: Gynecology

## 2011-10-24 ENCOUNTER — Ambulatory Visit (INDEPENDENT_AMBULATORY_CARE_PROVIDER_SITE_OTHER): Payer: PRIVATE HEALTH INSURANCE | Admitting: Gynecology

## 2011-10-24 DIAGNOSIS — N898 Other specified noninflammatory disorders of vagina: Secondary | ICD-10-CM

## 2011-10-24 DIAGNOSIS — N949 Unspecified condition associated with female genital organs and menstrual cycle: Secondary | ICD-10-CM

## 2011-10-24 DIAGNOSIS — B9689 Other specified bacterial agents as the cause of diseases classified elsewhere: Secondary | ICD-10-CM

## 2011-10-24 DIAGNOSIS — A499 Bacterial infection, unspecified: Secondary | ICD-10-CM

## 2011-10-24 DIAGNOSIS — N760 Acute vaginitis: Secondary | ICD-10-CM

## 2011-10-24 LAB — WET PREP FOR TRICH, YEAST, CLUE
Trich, Wet Prep: NONE SEEN
WBC, Wet Prep HPF POC: NONE SEEN
Yeast Wet Prep HPF POC: NONE SEEN

## 2011-10-24 MED ORDER — METRONIDAZOLE 500 MG PO TABS: ORAL_TABLET | ORAL | Status: DC

## 2011-10-24 NOTE — Progress Notes (Signed)
Patient complaint today of vaginal odor after her recent intercourse. She states that she still with her same sexual partner. See previous note outlining her detail history and workup for recurrent BV. She's currently on MetroGel twice a week for 6 months. Recent and STD screen was negative.  Exam: Bartholin urethra Skene was within normal limits Vagina: Slight fishy odor discharge clear and parous was evident Cervix: No lesions or discharge Uterus: Anteverted normal size shape and consistency Adnexa: No palpable masses or tenderness Rectal: Not examined  Wet prep demonstrated few clue cells many bacteria  Assessment/plan: Patient with mild BV. She'll be given a prescription for Flagyl 500 mg twice a day for 5 days. She'll continue with the MetroGel vaginal cream 2 times a week per vagina to complete six-month treatment for recurrent BV. She was given a prescription of Flagyl 500 mg to take 1 by mouth twice a day for 5 days her partner as well.

## 2011-10-26 ENCOUNTER — Ambulatory Visit: Payer: PRIVATE HEALTH INSURANCE | Admitting: Licensed Clinical Social Worker

## 2011-10-31 ENCOUNTER — Ambulatory Visit (INDEPENDENT_AMBULATORY_CARE_PROVIDER_SITE_OTHER): Payer: PRIVATE HEALTH INSURANCE | Admitting: Gynecology

## 2011-10-31 ENCOUNTER — Encounter: Payer: Self-pay | Admitting: Gynecology

## 2011-10-31 VITALS — BP 120/78

## 2011-10-31 DIAGNOSIS — N912 Amenorrhea, unspecified: Secondary | ICD-10-CM

## 2011-10-31 LAB — PREGNANCY, URINE: Preg Test, Ur: POSITIVE

## 2011-10-31 NOTE — Progress Notes (Signed)
Patient presented to the office today complaining of a positive home pregnancy test which was confirmed in the office. I explained to the patient was running a few minutes behind his had another high-risk patient in the room next door and she had nausea and was okay. As soon as I finished I can more to the room and the patient left the office. We'll make a first to reschedule her appointment and consider doing an ultrasound at the appropriate time interval and review current medication that she's on especially she's on Cleocin vaginal cream twice a week for a six-month course for recurrent BV and she is on Effexor for depression. The information she gave my medical assistant was that her last menstrual period was on September 20 so based on this is approximately 4-1/[redacted] weeks pregnant.

## 2011-11-01 ENCOUNTER — Encounter: Payer: Self-pay | Admitting: Gynecology

## 2011-11-01 ENCOUNTER — Telehealth: Payer: Self-pay | Admitting: *Deleted

## 2011-11-01 ENCOUNTER — Ambulatory Visit (INDEPENDENT_AMBULATORY_CARE_PROVIDER_SITE_OTHER): Payer: PRIVATE HEALTH INSURANCE | Admitting: Gynecology

## 2011-11-01 VITALS — BP 120/84

## 2011-11-01 DIAGNOSIS — Z3490 Encounter for supervision of normal pregnancy, unspecified, unspecified trimester: Secondary | ICD-10-CM

## 2011-11-01 DIAGNOSIS — Z331 Pregnant state, incidental: Secondary | ICD-10-CM

## 2011-11-01 DIAGNOSIS — N912 Amenorrhea, unspecified: Secondary | ICD-10-CM

## 2011-11-01 DIAGNOSIS — Z79899 Other long term (current) drug therapy: Secondary | ICD-10-CM

## 2011-11-01 NOTE — Progress Notes (Signed)
Patient is a 36 year old now gravida 4 para 0 AB 3 (1 elective abortion in her teens, missed AB 2011, right ectopic pregnancy 2013 treated with methotrexate) patient stated that her last menstrual period was September 20 and she had a positive pregnancy test here in the office. Patient denies any nausea vomiting or breast tenderness. Occasional twinges on her left lower abdomen she did not experience it today. She has had long-term history of recurrent BV and was on chronic treatment for 6 months on MetroGel twice a week. She also has history of having had depression and had been on Effexor. Recently she was treated with Flagyl for a recurrent BV which she will finish today.  Exam: Abdomen: Soft nontender no rebound or guarding Pelvic: Bartholin urethra Skene was within normal limits Vagina: No lesions or discharge Cervix: No lesions or discharge Uterus: Anteverted 4-6 weeks size nontender Adnexa: No palpable masses or tenderness Rectal exam: Not done  Assessment/plan: Patient high-risk for recurrent ectopic. March of this year she was treated with methotrexate for a right ectopic pregnancy. Patient with past history also gonorrhea. Patient with past history of recurrent BV. We will obtain a quantitative beta-hCG today and repeat next week with a followup ultrasound depending on the results of the quantitative beta-hCG. She will be started on prenatal vitamins. She stated that also approximately 3 weeks ago she had been drinking alcohol and perhaps  risky behavior. She also has been taking clobetasol for her scalp and I told her to hold off on clobetasol. I told her to discontinue the Effexor. She will finish the Flagyl today. The only medication that I want her to be on this as the prenatal vitamins daily. Of note her blood type is O-.

## 2011-11-01 NOTE — Telephone Encounter (Signed)
Pt informed with the below note, transferred to appointment desk 

## 2011-11-01 NOTE — Patient Instructions (Addendum)

## 2011-11-01 NOTE — Telephone Encounter (Signed)
Pt asked you would like her to continue taking the Metrogel vaginal cream? I told pt what office note said about returning back for OV. Pt said she had to left yesterday appointment because she had a very important meeting to attend at work. Please advise

## 2011-11-01 NOTE — Telephone Encounter (Signed)
She needs to sop the effexor and cleocin vaginal cream. Needs to schedule office visit.

## 2011-11-02 ENCOUNTER — Other Ambulatory Visit: Payer: Self-pay | Admitting: *Deleted

## 2011-11-02 ENCOUNTER — Other Ambulatory Visit: Payer: Self-pay | Admitting: Gynecology

## 2011-11-02 ENCOUNTER — Encounter: Payer: Self-pay | Admitting: Gynecology

## 2011-11-02 DIAGNOSIS — N644 Mastodynia: Secondary | ICD-10-CM

## 2011-11-02 LAB — HCG, QUANTITATIVE, PREGNANCY: hCG, Beta Chain, Quant, S: 2620.5 m[IU]/mL

## 2011-11-05 ENCOUNTER — Other Ambulatory Visit: Payer: PRIVATE HEALTH INSURANCE

## 2011-11-05 DIAGNOSIS — Z3490 Encounter for supervision of normal pregnancy, unspecified, unspecified trimester: Secondary | ICD-10-CM

## 2011-11-06 LAB — HCG, QUANTITATIVE, PREGNANCY: hCG, Beta Chain, Quant, S: 10637.3 m[IU]/mL

## 2011-11-08 ENCOUNTER — Ambulatory Visit (INDEPENDENT_AMBULATORY_CARE_PROVIDER_SITE_OTHER): Payer: PRIVATE HEALTH INSURANCE | Admitting: Gynecology

## 2011-11-08 ENCOUNTER — Encounter: Payer: Self-pay | Admitting: Gynecology

## 2011-11-08 ENCOUNTER — Ambulatory Visit (INDEPENDENT_AMBULATORY_CARE_PROVIDER_SITE_OTHER): Payer: PRIVATE HEALTH INSURANCE

## 2011-11-08 DIAGNOSIS — N912 Amenorrhea, unspecified: Secondary | ICD-10-CM

## 2011-11-08 DIAGNOSIS — O9989 Other specified diseases and conditions complicating pregnancy, childbirth and the puerperium: Secondary | ICD-10-CM

## 2011-11-08 DIAGNOSIS — Z3490 Encounter for supervision of normal pregnancy, unspecified, unspecified trimester: Secondary | ICD-10-CM

## 2011-11-08 DIAGNOSIS — N831 Corpus luteum cyst of ovary, unspecified side: Secondary | ICD-10-CM

## 2011-11-08 LAB — US OB TRANSVAGINAL

## 2011-11-08 NOTE — Progress Notes (Signed)
Patient is a 36 year old gravida 4 para 0 Ab3 (1 elective abortion in her teens, missed AB in 2011, right ectopic pregnancies 2013 treated with methotrexate) who was seen in the office on October 31 when she found out she was pregnant confirmed by urine pregnancy test in the office she underwent quantitative beta-hCGs which demonstrated the following:  Results for BELENDA, ALVIAR (MRN [id number]) as of 11/08/2011 16:49  Ref. Range 11/01/2011 14:50 11/05/2011 13:22  hCG, Beta Chain, Quant, S No range found 2620.5 10637.3   Patient presented to the office today asymptomatic. Her ultrasound was done today with the following result: Based on last menstrual period of 06/27/2012 patient would be 6 weeks in 6 days. Based on ultrasound findings she is 5 weeks and 3 days with a due date 07/07/2012. Ultrasound demonstrated irregular shaped gestational sac 5.6 mm consistent with 5 weeks and 2 days. No yoke sac was seen. Right ovary was normal. Corpus luteum cyst measuring 29 x 32 mm was noted and the left ovary thinwall. Cervix long closed. No adnexal masses seen cul-de-sac was negative.  Assessment/plan: First trimester early pregnancy appears to be intrauterine gestational sac. Patient wanted to have one more quantitative beta-hCG which will be drawn today and she'll return back to the office in 2 weeks for followup ultrasound to confirm viability. She'll continue her prenatal vitamins.

## 2011-11-08 NOTE — Patient Instructions (Addendum)

## 2011-11-09 ENCOUNTER — Ambulatory Visit: Payer: PRIVATE HEALTH INSURANCE | Admitting: Licensed Clinical Social Worker

## 2011-11-09 ENCOUNTER — Ambulatory Visit
Admission: RE | Admit: 2011-11-09 | Discharge: 2011-11-09 | Disposition: A | Discharge status: Home / Self Care | Payer: PRIVATE HEALTH INSURANCE | Source: Ambulatory Visit | Attending: Gynecology | Admitting: Gynecology

## 2011-11-09 ENCOUNTER — Encounter: Payer: Self-pay | Admitting: *Deleted

## 2011-11-09 DIAGNOSIS — N644 Mastodynia: Secondary | ICD-10-CM

## 2011-11-09 LAB — HCG, QUANTITATIVE, PREGNANCY: hCG, Beta Chain, Quant, S: 17021.6 m[IU]/mL

## 2011-11-09 NOTE — Progress Notes (Signed)
Patient ID: Melissa [name], female   DOB: 1975/05/05, 36 y.o.   MRN: [id number] Berniece Andreas called to inform office that pt has missed two scheduled appointment with her. She doesn't think pt is ready to be seen by her.

## 2011-11-15 ENCOUNTER — Telehealth: Payer: Self-pay | Admitting: Gynecology

## 2011-11-15 NOTE — Telephone Encounter (Signed)
Not unusual to have cramping early in pregnancy. Without bleeding or other symptoms I would observe in follow up for ultrasound as Dr. Lily Peer had scheduled

## 2011-11-15 NOTE — Telephone Encounter (Signed)
Dr. Manuela Schwartz patient with a hx of missed ab's.  Is early pregnant.  Called c/o menstrual like cramps and asking if normal.  No bleeding or spotting or pain on either side.  Feels fine otherwise. Patient reassured. To observe and call if pain worse that menstrual cramps or any bleeding.  I told her I would run it by Dr. Audie Box to be sure he agreed.  Also, c/o constipation.  Recommended she try Fiber One Bar or some Colace and increasing fiber in her diet and drinking plenty of fluids.

## 2011-11-15 NOTE — Telephone Encounter (Signed)
Patient advised.

## 2011-11-23 ENCOUNTER — Ambulatory Visit (INDEPENDENT_AMBULATORY_CARE_PROVIDER_SITE_OTHER): Payer: PRIVATE HEALTH INSURANCE | Admitting: Gynecology

## 2011-11-23 ENCOUNTER — Encounter: Payer: Self-pay | Admitting: Gynecology

## 2011-11-23 ENCOUNTER — Ambulatory Visit (INDEPENDENT_AMBULATORY_CARE_PROVIDER_SITE_OTHER): Payer: PRIVATE HEALTH INSURANCE

## 2011-11-23 VITALS — BP 128/80

## 2011-11-23 DIAGNOSIS — N912 Amenorrhea, unspecified: Secondary | ICD-10-CM

## 2011-11-23 DIAGNOSIS — Z3490 Encounter for supervision of normal pregnancy, unspecified, unspecified trimester: Secondary | ICD-10-CM

## 2011-11-23 DIAGNOSIS — O021 Missed abortion: Secondary | ICD-10-CM

## 2011-11-23 LAB — US OB TRANSVAGINAL

## 2011-11-23 MED ORDER — PROGESTERONE 25 MG VA SUPP: 25.0000 | Freq: Every day | VAGINAL | Status: DC

## 2011-11-23 MED ORDER — PROGESTERONE 25 MG VA SUPP: VAGINAL | Status: DC

## 2011-11-23 NOTE — Progress Notes (Signed)
Patient presented to the office today for followup on her early pregnancy. She's here for an ultrasound as well.Patient is a 36 year old gravida 4 para 0 Ab3 (1 elective abortion in her teens, missed AB in 2011, right ectopic pregnancies 2013 treated with methotrexate) who was seen in the office on October 31 when she found out she was pregnant confirmed by urine pregnancy test in the office she underwent quantitative beta-hCGs which demonstrated the following:  Results for Melissa [name], Melissa [name] (MRN [id number]) as of 11/23/2011 15:38  Ref. Range 11/01/2011 14:50 11/05/2011 13:22 11/08/2011 15:26  hCG, Beta Chain, Quant, S No range found 2620.5 10637.3 17021.6   Patient still confused as to when her last menstrual period was she had told us initially that her last menstrual period was September 30. Today she tells me she thought was may be September 20. Ultrasound today demonstrates the following: Based on her last menstrual period she is 7 weeks and 4 days with a due date 07/07/2012. By ultrasound she is 5 weeks and 6 days.  Ultrasound today demonstrated an intrauterine gestational sac with an enlarged yolk sac. A probable fetal pole was seen but no cardiac activity. Ovaries appeared to be normal.  Patient is asymptomatic today. I have discussed with her in detail that this could possibly be a missed AB but we will give her the benefit of the doubt since her quantitative beta-hCGs have been going off but not completely doubling. We will check a quantitative beta-hCG today and based on the results we'll schedule a followup ultrasound. We discussed for luteal support given her progesterone suppository 25 mg intravaginally each bedtime. Risks benefits and pros and cons were discussed.

## 2011-11-24 LAB — HCG, QUANTITATIVE, PREGNANCY: hCG, Beta Chain, Quant, S: 59407.5 m[IU]/mL

## 2011-11-26 ENCOUNTER — Telehealth: Payer: Self-pay | Admitting: *Deleted

## 2011-11-26 DIAGNOSIS — Z3490 Encounter for supervision of normal pregnancy, unspecified, unspecified trimester: Secondary | ICD-10-CM

## 2011-11-26 MED ORDER — PROGESTERONE 25 MG VA SUPP: VAGINAL | Status: DC

## 2011-11-26 NOTE — Telephone Encounter (Signed)
Message copied by Aura Camps on Mon Nov 26, 2011 10:43 AM ------      Message from: Ok Edwards      Created: Sun Nov 25, 2011  9:00 PM       Please inform patient that her Quantitative BHCG went from 10,637 two weeks ago to 59,407.5. Will need to follow up with ultrasound Monday Dec 2. Please schedule

## 2011-11-26 NOTE — Telephone Encounter (Signed)
Spoke with pt regarding the below note, pt asked what does this mean? And how many weeks is she? Pt is very concerned about this pregnancy. Please advise

## 2011-11-26 NOTE — Telephone Encounter (Signed)
PT INFORMED WITH THE BELOW NOTE, RX RE-SENT PHARMACY NEVER RECEIVED PROGESTERONE SUPP.

## 2011-11-26 NOTE — Telephone Encounter (Signed)
As I explained to her her quantitative beta-hCG are going upward which is a good sign. The determining factor will be the next ultrasound next week. Based on these values she is between 5-8 weeks into her pregnancy. As I mentioned before if no bleeding, her quantitative beta-hCGs her continued to rise and we saw a very early fetal pole all these are good sign. Let us wait until the ultrasound next week this is all I can tell her at this point.

## 2011-11-26 NOTE — Telephone Encounter (Signed)
[facility] doesn't have the progesterone supp. It has to be compound, [facility]. Pt informed.

## 2011-11-27 ENCOUNTER — Other Ambulatory Visit: Payer: Self-pay | Admitting: Gynecology

## 2011-11-27 ENCOUNTER — Telehealth: Payer: Self-pay | Admitting: *Deleted

## 2011-11-27 NOTE — Telephone Encounter (Signed)
Spoke with pt regarding the below note. She mention to me that she forgot to mention external vaginal itching at OV 11/23/11. No white discharge, itching only.

## 2011-11-27 NOTE — Telephone Encounter (Signed)
Tell her that she by the over-the-counter prenatal vitamins at [facility]. Tell her to look at the label  and make sure  that it has 1 mg of folic acid as one of the ingredients of the multivitamins. She just needs to take one prenatal multivitamin daily.

## 2011-11-27 NOTE — Telephone Encounter (Signed)
She can apply over-the-counter Monistat topically without any problem before bedtime for 2 or 3 days.

## 2011-11-27 NOTE — Telephone Encounter (Signed)
Pt asked she could have another rx for prenatal vit., the current vit. are too expensive at $57.00 so pt did not pick up Rx. She is requesting another Vit. Please advise

## 2011-12-03 NOTE — Telephone Encounter (Signed)
Left patient a message with Dr. Manuela Schwartz recommendation below.

## 2011-12-07 ENCOUNTER — Encounter: Payer: Self-pay | Admitting: Gynecology

## 2011-12-07 ENCOUNTER — Ambulatory Visit (INDEPENDENT_AMBULATORY_CARE_PROVIDER_SITE_OTHER): Payer: PRIVATE HEALTH INSURANCE | Admitting: Gynecology

## 2011-12-07 ENCOUNTER — Ambulatory Visit (INDEPENDENT_AMBULATORY_CARE_PROVIDER_SITE_OTHER): Payer: PRIVATE HEALTH INSURANCE

## 2011-12-07 ENCOUNTER — Telehealth: Payer: Self-pay | Admitting: Gynecology

## 2011-12-07 DIAGNOSIS — N912 Amenorrhea, unspecified: Secondary | ICD-10-CM

## 2011-12-07 DIAGNOSIS — Z3490 Encounter for supervision of normal pregnancy, unspecified, unspecified trimester: Secondary | ICD-10-CM

## 2011-12-07 DIAGNOSIS — R300 Dysuria: Secondary | ICD-10-CM | POA: Insufficient documentation

## 2011-12-07 DIAGNOSIS — O021 Missed abortion: Secondary | ICD-10-CM | POA: Insufficient documentation

## 2011-12-07 LAB — US OB TRANSVAGINAL

## 2011-12-07 LAB — URINALYSIS W MICROSCOPIC + REFLEX CULTURE
Bilirubin Urine: NEGATIVE
Glucose, UA: NEGATIVE mg/dL
Hgb urine dipstick: NEGATIVE
Ketones, ur: NEGATIVE mg/dL
Leukocytes, UA: NEGATIVE
Nitrite: NEGATIVE
Protein, ur: NEGATIVE mg/dL
Specific Gravity, Urine: 1.01 (ref 1.005–1.030)
Urobilinogen, UA: 0.2 mg/dL (ref 0.0–1.0)
pH: 6 (ref 5.0–8.0)

## 2011-12-07 LAB — CBC
HCT: 41.4 % (ref 36.0–46.0)
Hemoglobin: 14.5 g/dL (ref 12.0–15.0)
MCH: 31.6 pg (ref 26.0–34.0)
MCHC: 35 g/dL (ref 30.0–36.0)
MCV: 90.2 fL (ref 78.0–100.0)
Platelets: 298 10*3/uL (ref 150–400)
RBC: 4.59 MIL/uL (ref 3.87–5.11)
RDW: 13.6 % (ref 11.5–15.5)
WBC: 7.7 10*3/uL (ref 4.0–10.5)

## 2011-12-07 LAB — HEMOGLOBIN A1C
Hgb A1c MFr Bld: 5.3 % (ref <5.7)
Mean Plasma Glucose: 105 mg/dL (ref <117)

## 2011-12-07 LAB — TSH: TSH: 1.368 u[IU]/mL (ref 0.350–4.500)

## 2011-12-07 NOTE — Telephone Encounter (Signed)
I called patient to schedule surgery for next week.  The only time Dr. Glenetta Hew had available and OR had time as well was Tuesday, 12/10 at 7:30am.  Patient declined this and insisted she could not work it out for any time next week except Weds at 1:00pm.  She asked about the next week and I checked with Dr. Glenetta Hew who said that is fine but she may end up passing it on her own.  I advised patient of this and she still wanted to schedule.  She said she has waited this long she sees no harm in waiting til the following week.  She tells me the only day she can do it that week is Friday, Dec 20 1:00pm.  I called and scheduled her for that time at [facility].  Pamphlet mailed to patient.

## 2011-12-07 NOTE — Progress Notes (Signed)
Melissa [name] is an 35-year-old gravida 4 para 0 Ab3 (1 elective abortion in her teens, missed AB in 2011, right ectopic pregnancies 2013 treated with methotrexate) who was seen in the office on October 31 when she found out she was pregnant confirmed by urine pregnancy test in the office she underwent quantitative beta-hCGs which demonstrated the following:  Results for Melissa [name], Melissa [name] (MRN [id number]) as of 11/23/2011 15:38   Ref. Range  11/01/2011 14:50  11/05/2011 13:22  11/08/2011 15:26   hCG, Beta Chain, Quant, [name]  No range found  2620.5  10637.3  17021.6   Patient still confused as to when her last menstrual period was she had told us initially that her last menstrual period was September 30. Today she tells me she thought was may be September 20. Ultrasound today demonstrates the following: Based on her last menstrual period she is 7 weeks and 4 days with a due date 07/07/2012. By ultrasound she is 5 weeks and 6 days.  Ultrasound today demonstrated an intrauterine gestational sac with an enlarged yolk sac. A probable fetal pole was seen but no cardiac activity. Ovaries appeared to be normal. She presented today for followup ultrasound and to discuss her last quantitative beta-hCG. Her last quantitative beta-hCG November 22 was 59,407 milli-international units per mL. The ultrasound today demonstrated the following:  Intrauterine gestational sac with a fetal pole but once again no cardiac activity was noted. No change in ultrasound the past 2 weeks. Ovaries appeared to be normal. By last menstrual period patient is 7 weeks and 6 days by ultrasound 5 weeks and 6 days. Patient states that she is Rh-. When she had the ectopic pregnancy in the past she had to receive RhoGAM. We discussed the findings it appears that she has missed AB and we'll start the recurrent pregnancy loss evaluation as well as scheduled for D&E. Pertinent Gynecological History: Menses: Missed AB Bleeding: Missed  AB Contraception: Pregnant DES exposure: denies Blood transfusions: none Sexually transmitted diseases: Gonorrhea Previous GYN Procedures: D&C  Last mammogram: Normal Date: 2009 Last pap: normal Date: 2012 OB History: G 4, P0A3 (now missed AB)   Menstrual History: Menarche age: 12 Patient'[name] last menstrual period was 09/21/2011.    Past Medical History  Diagnosis Date  . Gonorrhea 2008  . No pertinent past medical history   . [name]/P ectopic pregnancy 2013    Treated with two doses of methotrexate  . Complete miscarriage   . Elective abortion     Past Surgical History  Procedure Date  . Cryoablation 2004  . Wisdom tooth extraction     History reviewed. No pertinent family history.  Social History:  reports that she quit smoking about 10 months ago. Her smoking use included Cigarettes. She has a 6.5 pack-year smoking history. She has never used smokeless tobacco. She reports that she drinks alcohol. She reports that she does not use illicit drugs.  Allergies: No Known Allergies   (Not in a hospital admission)  REVIEW OF SYSTEMS: A ROS was performed and pertinent positives and negatives are included in the history.  GENERAL: No fevers or chills. HEENT: No change in vision, no earache, sore throat or sinus congestion. NECK: No pain or stiffness. CARDIOVASCULAR: No chest pain or pressure. No palpitations. PULMONARY: No shortness of breath, cough or wheeze. GASTROINTESTINAL: No abdominal pain, nausea, vomiting or diarrhea, melena or bright red blood per rectum. GENITOURINARY: No urinary frequency, urgency, hesitancy or dysuria. MUSCULOSKELETAL: No joint or muscle pain, no back   pain, no recent trauma. DERMATOLOGIC: No rash, no itching, no lesions. ENDOCRINE: No polyuria, polydipsia, no heat or cold intolerance. No recent change in weight. HEMATOLOGICAL: No anemia or easy bruising or bleeding. NEUROLOGIC: No headache, seizures, numbness, tingling or weakness. PSYCHIATRIC: No depression,  no loss of interest in normal activity or change in sleep pattern.     Last menstrual period 09/21/2011.  Physical Exam:  HEENT:unremarkable Neck:Supple, midline, no thyroid megaly, no carotid bruits Lungs:  Clear to auscultation no rhonchi'[name] or wheezes Heart:Regular rate and rhythm, no murmurs or gallops Breast Exam: Not examined today Abdomen: Soft nontender no rebound or guarding Pelvic:BUS recent exam unremarkable Vagina: Recent exam unremarkable Cervix: Recent exam unremarkable Uterus: Recent exam 6-8 week size uterus Adnexa: Recent exam no palpable masses in the adnexa Extremities: No cords, no edema Rectal: Not examined  Results for orders placed in visit on 12/07/11 (from the past 24 hour([name]))  URINALYSIS WITH CULTURE REFLEX     Status: Normal   Collection Time   12/07/11 12:40 PM      Component Value Range   Color, Urine YELLOW  YELLOW   APPearance CLEAR  CLEAR   Specific Gravity, Urine 1.010  1.005 - 1.030   pH 6.0  5.0 - 8.0   Glucose, UA NEG  NEG mg/dL   Bilirubin Urine NEG  NEG   Ketones, ur NEG  NEG mg/dL   Hgb urine dipstick NEG  NEG   Protein, ur NEG  NEG mg/dL   Urobilinogen, UA 0.2  0.0 - 1.0 mg/dL   Nitrite NEG  NEG   Leukocytes, UA NEG  NEG   Narrative:    Performed at:  [facility]                [address]                [place], [place] [address]    No results found.  Assessment/Plan: Patient with apparent missed AB with poor doubling time on hCG and no fetal development only gestational sac with yolk sac but no cardiac activity. Patient was given option of medical treatment for miscarriage versus surgical D&E. Patient decided to proceed with D&E. The following labs were ordered today TSH, hemoglobin A1c, CBC, antiphospholipid antibodies IgG and IgM and lupus anticoagulant and blood type and Rh. We'll schedule her for D&E. I recommend that she use barrier contraception for 3 months. We will plan after her first menstrual  cycle in the near future to schedule a hysterosalpingogram to look at her fallopian tubes and she had a history of ectopic as well as the intrauterine cavity to see if we can explain cause for recurrent pregnancy loss. She was given progesterone suppository to apply intravaginally 25 mg daily early in the pregnancy and she never started them. Will await after several cycle so that on day 3 we can check an FSH as well as AMH levels. She been experiencing some dysuria for a few days. She denied fever chills nausea vomiting or vaginal discharge. Her urinalysis was negative and will submitted for a culture. I have given her samples of Uribell antispasmodic agent to take 1 by mouth 4 times a day for 2 days. I've tentatively given her prescription of Macrobid to take 1 by mouth twice a day for 7 days as we  wait for culture. The following risks were discussed with the patient in reference to her D&E:                          Patient was counseled as to the risk of surgery to include the following:  1. Infection (prohylactic antibiotics will be administered)  2. DVT/Pulmonary Embolism (prophylactic pneumo compression stockings will be used)  3.Trauma to internal organs requiring additional surgical procedure to repair any injury to     Internal organs requiring perhaps additional hospitalization days.  4.Hemmorhage requiring transfusion and blood products which carry risks such as  anaphylactic reaction, hepatitis and AIDS  Patient had received literature information on the procedure scheduled and all her questions were answered accepts all risk.  Aidee Latimore HMD2:20 PMTD@   Aria Jarrard H 12/07/2011, 2:09 PM   

## 2011-12-07 NOTE — Patient Instructions (Signed)
Patient information: Repeated miscarriage (The Basics)View in SpanishWritten by the doctors and editors at [employer]  What is repeated miscarriage? - A miscarriage is when a pregnancy ends on its own before a woman has been pregnant for 20 weeks. "Repeated miscarriage" is the term doctors use when a woman has 2 or more miscarriages in a row.  What causes repeated miscarriage? - In some cases, repeated miscarriage is caused by: Chromosome problems in the fetus - A "fetus" is what a baby is called while it is growing inside a woman. Chromosomes are structures in cells that contain thousands of genes. Chromosome problems usually happen by chance. But sometimes they happen because the mother or father has a chromosome problem. They also happen more often in older mothers.  Conditions in which something changes the shape of the inside of the uterus (figure 1)  An autoimmune condition in the mother - When a woman has an autoimmune condition, her infection-fighting system attacks healthy tissue in her body instead of infections.  Other medical conditions in the mother - Some of these include diabetes, thyroid disease, blood clotting problems, or hormone problems. In many cases, though, doctors don't know why repeated miscarriage happens. How will my doctor figure out why I am having repeated miscarriage? - To figure out why you are having repeated miscarriage, your doctor will: Talk with you and ask about your past pregnancies, monthly periods, and medical conditions  Do an exam, including a pelvic exam  Do blood tests - These include tests to check your immune system, blood clotting system, and hormone levels. They also include tests to check for certain medical conditions, such as thyroid disease or diabetes.  Do an imaging test to check your uterus - Your doctor can do different types of imaging tests to check your uterus. One common test is a special type of ultrasound. (An ultrasound uses sound waves to  create images of the inside of the body.)  Do a test to look inside your uterus - This involves having a thin tube with a camera and light on the end put into your vagina and up into your uterus.  Do chromosome tests on you and the baby's father - Before and after the testing, you will talk with a genetic counselor. This is a person who specializes in genetic problems. You might have other tests, too, depending on your individual situation. How is repeated miscarriage treated? - If your doctor finds a possible cause that can be treated, he or she will treat it. Treatment might help improve your chances of having a successful pregnancy. For example, problems in the uterus can sometimes be treated with surgery. Certain medical, immune, or hormone problems can sometimes be treated with medicines. Is there anything I can do on my own to avoid another miscarriage? - There is no sure way to avoid having another miscarriage. But you might be able to lower your chances of having one by avoiding cigarettes, alcohol, caffeine, and any injury to your belly. You can also try to stay at a healthy weight. Will I be able to have a baby someday? - Many women who have repeated miscarriage are able to have a baby in the future.  Let your doctor know when you are planning to become pregnant and then as soon as you think you are pregnant. That way, he or she can treat you with hormones or other treatments. Also, your doctor will be able to monitor your pregnancy as much as possible so  you can have a successful pregnancy. How can I get support if I'm having a hard time coping? - Having repeated miscarriage can be very tough. If you are having a hard time coping, talk to your doctor or nurse. He or she can help you find a counselor to talk to. Some people also find it helpful to go to a support group for people who are in the same situation. More on this topic

## 2011-12-08 LAB — ABO AND RH: Rh Type: NEGATIVE

## 2011-12-10 ENCOUNTER — Other Ambulatory Visit: Payer: Self-pay | Admitting: Gynecology

## 2011-12-10 LAB — URINE CULTURE: Colony Count: >=100000

## 2011-12-10 MED ORDER — AMPICILLIN 500 MG PO CAPS: 500.0000 mg | ORAL_CAPSULE | Freq: Two times a day (BID) | ORAL | Status: DC

## 2011-12-11 ENCOUNTER — Other Ambulatory Visit: Payer: Self-pay | Admitting: Gynecology

## 2011-12-11 ENCOUNTER — Telehealth: Payer: Self-pay | Admitting: *Deleted

## 2011-12-11 LAB — ANTIPHOSPHOLIPID SYNDROME EVAL, BLD
Anticardiolipin IgA: 14 APL U/mL (ref <22)
Anticardiolipin IgG: 9 GPL U/mL (ref <23)
Anticardiolipin IgM: 5 MPL U/mL (ref <11)
DRVVT: 38.6 secs (ref <42.9)
Lupus Anticoagulant: NOT DETECTED
PTT Lupus Anticoagulant: 40 secs (ref 28.0–43.0)
Phosphatidylserine IgG Autoantibodies: 9 U/mL (ref <16)
Phosphatydalserine, IgA: 7 U/mL (ref <20)
Phosphatydalserine, IgM: 0 U/mL (ref <22)

## 2011-12-11 NOTE — Telephone Encounter (Signed)
Pt said it was ketoconazole 200 mg # 10 take 1 by mouth daily for 5 days. Pt said she used this for her dry scalp, she would like refills as well. Please advise

## 2011-12-11 NOTE — Telephone Encounter (Signed)
Pt called requesting refill on Ketoconazole. She also said the Effexor isn't  working, she would like adderall. Please advise

## 2011-12-11 NOTE — Telephone Encounter (Signed)
Pt was okay with the below note. Can pt have the Ketoconazole.

## 2011-12-11 NOTE — Telephone Encounter (Signed)
Please find out specifically if it is the cream and if so wide issue using it for and I need the dosage that's on the label.

## 2011-12-11 NOTE — Telephone Encounter (Signed)
I would recommend that she sees the provider who has been treating her with the Adderall and discuss other antidepressant agents besides the Effexor I had tried with her. She will need to be evaluated by them again to find the right combination for her particular medical condition.

## 2011-12-11 NOTE — Telephone Encounter (Signed)
I did not prescribe her this before. She needs to contact the physician who prescribed her this medication. This medication has a tendency to cause the liver toxicity and needs to be monitored closely.

## 2011-12-12 NOTE — Telephone Encounter (Signed)
Left the below message on pt voicemail. 

## 2011-12-18 ENCOUNTER — Encounter (HOSPITAL_BASED_OUTPATIENT_CLINIC_OR_DEPARTMENT_OTHER): Payer: Self-pay | Admitting: *Deleted

## 2011-12-18 NOTE — Progress Notes (Signed)
Pt instructed npo p mn 12/19.  To [facility] 12/20 @1145 . Pt to come by Thursday @ !pm for cbc, u/a, rh status.  Pt states she's unable to come before 1230 on 12/20.  Pt informed pts are requested to come 1'15" prior to surgery start time. Pt states she also has dark polish on her nails.  Pt informed that no dark polish allowed  Per protocol.  Pt verbalized her dissatisfaction w/ time and no dark polish .  Recommended that she speak w/ her surgeon regarding arrival time and polish.

## 2011-12-19 ENCOUNTER — Telehealth: Payer: Self-pay | Admitting: *Deleted

## 2011-12-19 ENCOUNTER — Ambulatory Visit (INDEPENDENT_AMBULATORY_CARE_PROVIDER_SITE_OTHER): Payer: No Typology Code available for payment source | Admitting: Licensed Clinical Social Worker

## 2011-12-19 ENCOUNTER — Other Ambulatory Visit: Payer: Self-pay | Admitting: Gynecology

## 2011-12-19 DIAGNOSIS — F331 Major depressive disorder, recurrent, moderate: Secondary | ICD-10-CM

## 2011-12-19 NOTE — Telephone Encounter (Signed)
1. Pt is called requesting lab result for 12/07/11, she asked what does the Antiphospholipid Syndrome blood work show?  2. Pt said that she had cryosurgery done back in 2004 and now is questioning if this is why she cant carry a baby to full term. I explained to pt that I wouldn't see why that would cause infertility. She told me to run this info by you.

## 2011-12-19 NOTE — Telephone Encounter (Signed)
Please inform patient that all her blood work came back normal. The cryo-(freezing of her cervix) has nothing to do with her miscarriage.

## 2011-12-20 LAB — CBC
HCT: 37.7 % (ref 36.0–46.0)
Hemoglobin: 13.2 g/dL (ref 12.0–15.0)
MCH: 31 pg (ref 26.0–34.0)
MCHC: 35 g/dL (ref 30.0–36.0)
MCV: 88.5 fL (ref 78.0–100.0)
Platelets: 270 10*3/uL (ref 150–400)
RBC: 4.26 MIL/uL (ref 3.87–5.11)
RDW: 12.1 % (ref 11.5–15.5)
WBC: 5.7 10*3/uL (ref 4.0–10.5)

## 2011-12-20 MED ORDER — RHO D IMMUNE GLOBULIN 1500 UNIT/2ML IJ SOLN
300.0000 ug | Freq: Once | INTRAMUSCULAR | Status: AC
  Administered 2011-12-21: 300 ug via INTRAMUSCULAR
  Filled 2011-12-20: qty 2

## 2011-12-20 NOTE — Telephone Encounter (Signed)
Left message on pt voicemail regarding the below. 

## 2011-12-21 ENCOUNTER — Ambulatory Visit (HOSPITAL_BASED_OUTPATIENT_CLINIC_OR_DEPARTMENT_OTHER): Payer: PRIVATE HEALTH INSURANCE | Admitting: Anesthesiology

## 2011-12-21 ENCOUNTER — Encounter (HOSPITAL_BASED_OUTPATIENT_CLINIC_OR_DEPARTMENT_OTHER): Payer: Self-pay | Admitting: Anesthesiology

## 2011-12-21 ENCOUNTER — Encounter (HOSPITAL_BASED_OUTPATIENT_CLINIC_OR_DEPARTMENT_OTHER)
Admission: RE | Disposition: A | Discharge status: Home / Self Care | Payer: Self-pay | Source: Ambulatory Visit | Attending: Gynecology

## 2011-12-21 ENCOUNTER — Ambulatory Visit (HOSPITAL_BASED_OUTPATIENT_CLINIC_OR_DEPARTMENT_OTHER)
Admission: RE | Admit: 2011-12-21 | Discharge: 2011-12-21 | Disposition: A | Discharge status: Home / Self Care | Payer: PRIVATE HEALTH INSURANCE | Source: Ambulatory Visit | Attending: Gynecology | Admitting: Gynecology

## 2011-12-21 ENCOUNTER — Encounter (HOSPITAL_BASED_OUTPATIENT_CLINIC_OR_DEPARTMENT_OTHER): Payer: Self-pay | Admitting: *Deleted

## 2011-12-21 DIAGNOSIS — Z9889 Other specified postprocedural states: Secondary | ICD-10-CM

## 2011-12-21 DIAGNOSIS — O021 Missed abortion: Secondary | ICD-10-CM

## 2011-12-21 HISTORY — PX: DILATION AND EVACUATION: SHX1459

## 2011-12-21 LAB — ABO/RH: ABO/RH(D): O NEG

## 2011-12-21 SURGERY — DILATION AND EVACUATION, UTERUS
Anesthesia: General | Site: Vagina | Wound class: Clean Contaminated

## 2011-12-21 MED ORDER — ACETAMINOPHEN 10 MG/ML IV SOLN
INTRAVENOUS | Status: DC | PRN
  Administered 2011-12-21: 1000 mg via INTRAVENOUS

## 2011-12-21 MED ORDER — PROPOFOL 10 MG/ML IV BOLUS
INTRAVENOUS | Status: DC | PRN
  Administered 2011-12-21: 50 mg via INTRAVENOUS
  Administered 2011-12-21: 200 mg via INTRAVENOUS

## 2011-12-21 MED ORDER — DEXAMETHASONE SODIUM PHOSPHATE 4 MG/ML IJ SOLN
INTRAMUSCULAR | Status: DC | PRN
  Administered 2011-12-21: 10 mg via INTRAVENOUS

## 2011-12-21 MED ORDER — KETOROLAC TROMETHAMINE 30 MG/ML IJ SOLN
INTRAMUSCULAR | Status: DC | PRN
  Administered 2011-12-21: 30 mg via INTRAVENOUS

## 2011-12-21 MED ORDER — ONDANSETRON HCL 4 MG/2ML IJ SOLN
INTRAMUSCULAR | Status: DC | PRN
  Administered 2011-12-21: 4 mg via INTRAVENOUS

## 2011-12-21 MED ORDER — METOCLOPRAMIDE HCL 10 MG PO TABS: 10.0000 mg | ORAL_TABLET | Freq: Three times a day (TID) | ORAL | Status: DC

## 2011-12-21 MED ORDER — OXYCODONE-ACETAMINOPHEN 5-325 MG PO TABS
2.5000 | ORAL_TABLET | ORAL | Status: DC | PRN
  Administered 2011-12-21 (×2): 2.5 via ORAL
  Filled 2011-12-21: qty 3

## 2011-12-21 MED ORDER — OXYCODONE-ACETAMINOPHEN 2.5-325 MG PO TABS: 1.0000 | ORAL_TABLET | ORAL | Status: DC | PRN

## 2011-12-21 MED ORDER — LACTATED RINGERS IV SOLN
INTRAVENOUS | Status: DC
  Administered 2011-12-21: 100 mL/h via INTRAVENOUS
  Filled 2011-12-21: qty 1000

## 2011-12-21 MED ORDER — CEFAZOLIN SODIUM-DEXTROSE 2-3 GM-% IV SOLR
2.0000 g | INTRAVENOUS | Status: AC
  Administered 2011-12-21: 2 g via INTRAVENOUS
  Filled 2011-12-21: qty 50

## 2011-12-21 MED ORDER — FENTANYL CITRATE 0.05 MG/ML IJ SOLN
INTRAMUSCULAR | Status: DC | PRN
  Administered 2011-12-21: 50 ug via INTRAVENOUS

## 2011-12-21 MED ORDER — PROMETHAZINE HCL 25 MG/ML IJ SOLN
6.2500 mg | INTRAMUSCULAR | Status: DC | PRN
  Filled 2011-12-21: qty 1

## 2011-12-21 MED ORDER — MIDAZOLAM HCL 5 MG/5ML IJ SOLN
INTRAMUSCULAR | Status: DC | PRN
  Administered 2011-12-21: 2 mg via INTRAVENOUS

## 2011-12-21 MED ORDER — OXYTOCIN 10 UNIT/ML IJ SOLN
INTRAMUSCULAR | Status: DC | PRN
  Administered 2011-12-21: 20 [IU] via INTRAMUSCULAR

## 2011-12-21 MED ORDER — LIDOCAINE HCL (CARDIAC) 20 MG/ML IV SOLN
INTRAVENOUS | Status: DC | PRN
  Administered 2011-12-21: 80 mg via INTRAVENOUS

## 2011-12-21 MED ORDER — FENTANYL CITRATE 0.05 MG/ML IJ SOLN
25.0000 ug | INTRAMUSCULAR | Status: DC | PRN
  Administered 2011-12-21 (×2): 0.25 ug via INTRAVENOUS
  Filled 2011-12-21: qty 1

## 2011-12-21 SURGICAL SUPPLY — 30 items
CATH ROBINSON RED A/P 16FR (CATHETERS) IMPLANT
CLOTH BEACON ORANGE TIMEOUT ST (SAFETY) ×2 IMPLANT
DRAPE UNDERBUTTOCKS STRL (DRAPE) ×1 IMPLANT
DRESSING TELFA 8X3 (GAUZE/BANDAGES/DRESSINGS) IMPLANT
GLOVE BIOGEL PI IND STRL 7.0 (GLOVE) IMPLANT
GLOVE BIOGEL PI INDICATOR 7.0 (GLOVE) ×1
GLOVE ECLIPSE 7.5 STRL STRAW (GLOVE) ×2 IMPLANT
GLOVE INDICATOR 6.5 STRL GRN (GLOVE) ×1 IMPLANT
GLOVE INDICATOR 8.0 STRL GRN (GLOVE) ×2 IMPLANT
GOWN PREVENTION PLUS XLARGE (GOWN DISPOSABLE) ×1 IMPLANT
GOWN STRL NON-REIN LRG LVL3 (GOWN DISPOSABLE) ×1 IMPLANT
GOWN W/2 COTTON TOWELS 2 STD (GOWNS) ×2 IMPLANT
KIT BERKELEY 1ST TRIMESTER 3/8 (MISCELLANEOUS) IMPLANT
NDL SPNL 22GX3.5 QUINCKE BK (NEEDLE) IMPLANT
NEEDLE SPNL 22GX3.5 QUINCKE BK (NEEDLE) IMPLANT
NS IRRIG 500ML POUR BTL (IV SOLUTION) ×2 IMPLANT
PACK BASIN DAY SURGERY FS (CUSTOM PROCEDURE TRAY) ×2 IMPLANT
PAD OB MATERNITY 4.3X12.25 (PERSONAL CARE ITEMS) ×2 IMPLANT
PAD PREP 24X48 CUFFED NSTRL (MISCELLANEOUS) ×2 IMPLANT
SCOPETTES 8  STERILE (MISCELLANEOUS)
SCOPETTES 8 STERILE (MISCELLANEOUS) IMPLANT
SET BERKELEY SUCTION TUBING (SUCTIONS) ×2 IMPLANT
SYR CONTROL 10ML LL (SYRINGE) IMPLANT
TOP DISP BERKELEY (MISCELLANEOUS) ×1 IMPLANT
TOWEL OR 17X24 6PK STRL BLUE (TOWEL DISPOSABLE) ×2 IMPLANT
TRAY DSU PREP LF (CUSTOM PROCEDURE TRAY) ×2 IMPLANT
VACURETTE 10 RIGID CVD (CANNULA) IMPLANT
VACURETTE 7MM CVD STRL WRAP (CANNULA) IMPLANT
VACURETTE 8 RIGID CVD (CANNULA) ×1 IMPLANT
VACURETTE 9 RIGID CVD (CANNULA) IMPLANT

## 2011-12-21 NOTE — H&P (View-Only) (Signed)
Melissa [name] is an 36 year old gravida 4 para 0 Ab3 (1 elective abortion in her teens, missed AB in 2011, right ectopic pregnancies 2013 treated with methotrexate) who was seen in the office on October 31 when she found out she was pregnant confirmed by urine pregnancy test in the office she underwent quantitative beta-hCGs which demonstrated the following:  Results for Melissa, [name] (MRN [id number]) as of 11/23/2011 15:38   Ref. Range  11/01/2011 14:50  11/05/2011 13:22  11/08/2011 15:26   hCG, Beta Chain, Quant, S  No range found  2620.5  10637.3  17021.6   Patient still confused as to when her last menstrual period was she had told us initially that her last menstrual period was September 30. Today she tells me she thought was may be September 20. Ultrasound today demonstrates the following: Based on her last menstrual period she is 7 weeks and 4 days with a due date 07/07/2012. By ultrasound she is 5 weeks and 6 days.  Ultrasound today demonstrated an intrauterine gestational sac with an enlarged yolk sac. A probable fetal pole was seen but no cardiac activity. Ovaries appeared to be normal. She presented today for followup ultrasound and to discuss her last quantitative beta-hCG. Her last quantitative beta-hCG November 22 was 59,407 milli-international units per mL. The ultrasound today demonstrated the following:  Intrauterine gestational sac with a fetal pole but once again no cardiac activity was noted. No change in ultrasound the past 2 weeks. Ovaries appeared to be normal. By last menstrual period patient is 7 weeks and 6 days by ultrasound 5 weeks and 6 days. Patient states that she is Rh-. When she had the ectopic pregnancy in the past she had to receive RhoGAM. We discussed the findings it appears that she has missed AB and we'll start the recurrent pregnancy loss evaluation as well as scheduled for D&E. Pertinent Gynecological History: Menses: Missed AB Bleeding: Missed  AB Contraception: Pregnant DES exposure: denies Blood transfusions: none Sexually transmitted diseases: Gonorrhea Previous GYN Procedures: D&C  Last mammogram: Normal Date: 2009 Last pap: normal Date: 2012 OB History: G 4, P0A3 (now missed AB)   Menstrual History: Menarche age: 11 Patient's last menstrual period was 09/21/2011.    Past Medical History  Diagnosis Date  . Gonorrhea 2008  . No pertinent past medical history   . S/P ectopic pregnancy 2013    Treated with two doses of methotrexate  . Complete miscarriage   . Elective abortion     Past Surgical History  Procedure Date  . Cryoablation 2004  . Wisdom tooth extraction     History reviewed. No pertinent family history.  Social History:  reports that she quit smoking about 10 months ago. Her smoking use included Cigarettes. She has a 6.5 pack-year smoking history. She has never used smokeless tobacco. She reports that she drinks alcohol. She reports that she does not use illicit drugs.  Allergies: No Known Allergies   (Not in a hospital admission)  REVIEW OF SYSTEMS: A ROS was performed and pertinent positives and negatives are included in the history.  GENERAL: No fevers or chills. HEENT: No change in vision, no earache, sore throat or sinus congestion. NECK: No pain or stiffness. CARDIOVASCULAR: No chest pain or pressure. No palpitations. PULMONARY: No shortness of breath, cough or wheeze. GASTROINTESTINAL: No abdominal pain, nausea, vomiting or diarrhea, melena or bright red blood per rectum. GENITOURINARY: No urinary frequency, urgency, hesitancy or dysuria. MUSCULOSKELETAL: No joint or muscle pain, no back  pain, no recent trauma. DERMATOLOGIC: No rash, no itching, no lesions. ENDOCRINE: No polyuria, polydipsia, no heat or cold intolerance. No recent change in weight. HEMATOLOGICAL: No anemia or easy bruising or bleeding. NEUROLOGIC: No headache, seizures, numbness, tingling or weakness. PSYCHIATRIC: No depression,  no loss of interest in normal activity or change in sleep pattern.     Last menstrual period 09/21/2011.  Physical Exam:  HEENT:unremarkable Neck:Supple, midline, no thyroid megaly, no carotid bruits Lungs:  Clear to auscultation no rhonchi's or wheezes Heart:Regular rate and rhythm, no murmurs or gallops Breast Exam: Not examined today Abdomen: Soft nontender no rebound or guarding Pelvic:BUS recent exam unremarkable Vagina: Recent exam unremarkable Cervix: Recent exam unremarkable Uterus: Recent exam 6-8 week size uterus Adnexa: Recent exam no palpable masses in the adnexa Extremities: No cords, no edema Rectal: Not examined  Results for orders placed in visit on 12/07/11 (from the past 24 hour(s))  URINALYSIS WITH CULTURE REFLEX     Status: Normal   Collection Time   12/07/11 12:40 PM      Component Value Range   Color, Urine YELLOW  YELLOW   APPearance CLEAR  CLEAR   Specific Gravity, Urine 1.010  1.005 - 1.030   pH 6.0  5.0 - 8.0   Glucose, UA NEG  NEG mg/dL   Bilirubin Urine NEG  NEG   Ketones, ur NEG  NEG mg/dL   Hgb urine dipstick NEG  NEG   Protein, ur NEG  NEG mg/dL   Urobilinogen, UA 0.2  0.0 - 1.0 mg/dL   Nitrite NEG  NEG   Leukocytes, UA NEG  NEG   Narrative:    Performed at:  [facility]                [address]                [place], [place] [address]    No results found.  Assessment/Plan: Patient with apparent missed AB with poor doubling time on hCG and no fetal development only gestational sac with yolk sac but no cardiac activity. Patient was given option of medical treatment for miscarriage versus surgical D&E. Patient decided to proceed with D&E. The following labs were ordered today TSH, hemoglobin A1c, CBC, antiphospholipid antibodies IgG and IgM and lupus anticoagulant and blood type and Rh. We'll schedule her for D&E. I recommend that she use barrier contraception for 3 months. We will plan after her first menstrual  cycle in the near future to schedule a hysterosalpingogram to look at her fallopian tubes and she had a history of ectopic as well as the intrauterine cavity to see if we can explain cause for recurrent pregnancy loss. She was given progesterone suppository to apply intravaginally 25 mg daily early in the pregnancy and she never started them. Will await after several cycle so that on day 3 we can check an [facility] as well as AMH levels. She been experiencing some dysuria for a few days. She denied fever chills nausea vomiting or vaginal discharge. Her urinalysis was negative and will submitted for a culture. I have given her samples of Uribell antispasmodic agent to take 1 by mouth 4 times a day for 2 days. I've tentatively given her prescription of Macrobid to take 1 by mouth twice a day for 7 days as we  wait for culture. The following risks were discussed with the patient in reference to her D&E:  Patient was counseled as to the risk of surgery to include the following:  1. Infection (prohylactic antibiotics will be administered)  2. DVT/Pulmonary Embolism (prophylactic pneumo compression stockings will be used)  3.Trauma to internal organs requiring additional surgical procedure to repair any injury to     Internal organs requiring perhaps additional hospitalization days.  4.Hemmorhage requiring transfusion and blood products which carry risks such as  anaphylactic reaction, hepatitis and AIDS  Patient had received literature information on the procedure scheduled and all her questions were answered accepts all risk.  [facility] HMD2:20 PMTD@   Skylynn Burkley H 12/07/2011, 2:09 PM

## 2011-12-21 NOTE — Interval H&P Note (Signed)
History and Physical Interval Note:  12/21/2011 1:14 PM  Melissa [name]  has presented today for surgery, with the diagnosis of missed abortion  The various methods of treatment have been discussed with the patient and family. After consideration of risks, benefits and other options for treatment, the patient has consented to  Procedure(s) (LRB) with comments: DILATATION AND EVACUATION (N/A) - first trimester  as a surgical intervention .  The patient's history has been reviewed, patient examined, no change in status, stable for surgery.  I have reviewed the patient's chart and labs.  Questions were answered to the patient's satisfaction.     Ok Edwards

## 2011-12-21 NOTE — Op Note (Signed)
12/21/2011  2:32 PM  PATIENT:  Melissa [name]  36 y.o. female  PRE-OPERATIVE DIAGNOSIS:  missed abortion  POST-OPERATIVE DIAGNOSIS:  missed abortion  PROCEDURE:  Procedure(s): DILATATION AND EVACUATION  SURGEON:  Surgeon(s): Ok Edwards, MD  ANESTHESIA:   general  FINDINGS: A week size uterus anteverted with no palpable rectal masses  DESCRIPTION OF OPERATION: Patient was taken to the operating room where she underwent successful general endotracheal anesthesia. Patient received a gram of Ancef prophylactically. Patient had pneumonic compression stockings for DVT prophylaxis as well. Time out was undertaken to identify the patient in the procedure to be undertaken. The vagina and perineum were prepped and draped in usual sterile fashion. A red rubber August was inserted into the bladder tobacco is content for approximately 50 cc. Bimanual examination demonstrated an 8 week size uterus anteverted without palpable masses or tenderness. Speculum was introduced into the vagina. A single-tooth tenaculum was placed on the anterior cervical lip. The uterus sounded to 8 cm. The cervical canal was dilated to a size 27 mm Pratt. An 8 mm suction curette was then introduced into the intrauterine cavity to evacuate its contents. This was interchanged with a hunters curet to completely evacuate the uterine cavity of its contents. The tissue was submitted for histological evaluation but a small aliquot was submitted for karyotype. The single-tooth tenaculum was removed patient tolerated procedure well was transferred to recovery stable vital signs and received Toradol 30 mg IV. Since patient is O- blood type she was to receive RhoGAM 300 and micrograms IM in the recovery room. Blood loss was minimal.  ESTIMATED BLOOD LOSS: Minimal  Intake/Output Summary (Last 24 hours) at 12/21/11 1432 Last data filed at 12/21/11 1410  Gross per 24 hour  Intake    600 ml  Output      0 ml  Net    600 ml      BLOOD ADMINISTERED:none   LOCAL MEDICATIONS USED:  NONE  SPECIMEN:  Source of Specimen:  Intrauterine contents  DISPOSITION OF SPECIMEN:  PATHOLOGY  COUNTS:  YES  PLAN OF CARE: Transfer to PACU  [facility] HMD2:32 PMTD@

## 2011-12-21 NOTE — Transfer of Care (Signed)
Immediate Anesthesia Transfer of Care Note  Patient: Melissa [name]  Procedure(s) Performed: Procedure(s) (LRB) with comments: DILATATION AND EVACUATION (N/A) - first trimester   Patient Location: PACU  Anesthesia Type:General  Level of Consciousness: awake and oriented  Airway & Oxygen Therapy: Patient Spontanous Breathing and Patient connected to nasal cannula oxygen  Post-op Assessment: Report given to PACU RN  Post vital signs: Reviewed and stable  Complications: No apparent anesthesia complications

## 2011-12-21 NOTE — Anesthesia Preprocedure Evaluation (Signed)
Anesthesia Evaluation  Patient identified by MRN, date of birth, ID band Patient awake  General Assessment Comment:Missed Abortion.  Reviewed: Allergy & Precautions, H&P , NPO status , Patient's Chart, lab work & pertinent test results, reviewed documented beta blocker date and time   Airway Mallampati: II TM Distance: >3 FB Neck ROM: full    Dental No notable dental hx.    Pulmonary neg pulmonary ROS,  breath sounds clear to auscultation  Pulmonary exam normal       Cardiovascular Exercise Tolerance: Good negative cardio ROS  Rhythm:regular Rate:Normal     Neuro/Psych negative neurological ROS  negative psych ROS   GI/Hepatic negative GI ROS, Neg liver ROS,   Endo/Other  negative endocrine ROS  Renal/GU negative Renal ROS  negative genitourinary   Musculoskeletal   Abdominal (+) + obese,   Peds  Hematology negative hematology ROS (+)   Anesthesia Other Findings   Reproductive/Obstetrics negative OB ROS                           Anesthesia Physical Anesthesia Plan  ASA: II  Anesthesia Plan: General   Post-op Pain Management:    Induction:   Airway Management Planned:   Additional Equipment:   Intra-op Plan:   Post-operative Plan:   Informed Consent: I have reviewed the patients History and Physical, chart, labs and discussed the procedure including the risks, benefits and alternatives for the proposed anesthesia with the patient or authorized representative who has indicated his/her understanding and acceptance.   Dental Advisory Given  Plan Discussed with: CRNA  Anesthesia Plan Comments:         Anesthesia Quick Evaluation

## 2011-12-21 NOTE — Anesthesia Postprocedure Evaluation (Signed)
Anesthesia Post Note  Patient: Melissa [name]  Procedure(s) Performed: Procedure(s) (LRB): DILATATION AND EVACUATION (N/A)  Anesthesia type: General  Patient location: PACU  Post pain: Pain level controlled  Post assessment: Post-op Vital signs reviewed  Last Vitals:  Filed Vitals:   12/21/11 1408  BP:   Pulse: 77  Temp: 36.3 C  Resp:     Post vital signs: Reviewed  Level of consciousness: sedated  Complications: No apparent anesthesia complications

## 2011-12-21 NOTE — Anesthesia Procedure Notes (Signed)
Procedure Name: LMA Insertion Date/Time: 12/21/2011 1:43 PM Performed by: Maris Berger T Pre-anesthesia Checklist: Patient identified, Emergency Drugs available, Suction available and Patient being monitored Patient Re-evaluated:Patient Re-evaluated prior to inductionOxygen Delivery Method: Circle System Utilized Preoxygenation: Pre-oxygenation with 100% oxygen Intubation Type: IV induction Ventilation: Mask ventilation without difficulty LMA: LMA inserted LMA Size: 4.0 Number of attempts: 1 Placement Confirmation: positive ETCO2 Dental Injury: Teeth and Oropharynx as per pre-operative assessment  Comments: Gauze roll between teeth

## 2011-12-22 LAB — RH IG WORKUP (INCLUDES ABO/RH)
ABO/RH(D): O NEG
Antibody Screen: NEGATIVE
Gestational Age(Wks): 8
Unit division: 0

## 2011-12-24 ENCOUNTER — Encounter (HOSPITAL_BASED_OUTPATIENT_CLINIC_OR_DEPARTMENT_OTHER): Payer: Self-pay | Admitting: Gynecology

## 2011-12-28 ENCOUNTER — Telehealth: Payer: Self-pay | Admitting: *Deleted

## 2011-12-28 NOTE — Telephone Encounter (Signed)
Pt called asking when should be able to have sex after having D & E on 12/21/11. Pt informed to wait 2 weeks.

## 2012-01-03 ENCOUNTER — Telehealth: Payer: Self-pay | Admitting: *Deleted

## 2012-01-03 MED ORDER — KETOCONAZOLE 200 MG PO TABS: 200.0000 mg | ORAL_TABLET | Freq: Every day | ORAL | Status: DC

## 2012-01-03 NOTE — Telephone Encounter (Signed)
One PO q daily for 7 days. No refills. She should use barrier contraception during this time. Before calling it in check with her to confirm why she is taking it.

## 2012-01-03 NOTE — Telephone Encounter (Signed)
Pt takes for dry scalp her dermatologist which is no longer in practice, prescribed Rx.

## 2012-01-03 NOTE — Telephone Encounter (Signed)
Pharmacy faxed over refill request for ketoconazole 200 mg tab. # 7. You filled this for pt OV 04/27/11. Okay to fill? Refills?

## 2012-01-11 ENCOUNTER — Ambulatory Visit: Payer: Self-pay | Admitting: Gynecology

## 2012-01-18 ENCOUNTER — Ambulatory Visit: Payer: PRIVATE HEALTH INSURANCE | Admitting: Gynecology

## 2012-01-25 ENCOUNTER — Ambulatory Visit (INDEPENDENT_AMBULATORY_CARE_PROVIDER_SITE_OTHER): Payer: PRIVATE HEALTH INSURANCE | Admitting: Gynecology

## 2012-01-25 ENCOUNTER — Encounter: Payer: Self-pay | Admitting: Gynecology

## 2012-01-25 VITALS — BP 136/90

## 2012-01-25 DIAGNOSIS — N96 Recurrent pregnancy loss: Secondary | ICD-10-CM

## 2012-01-25 DIAGNOSIS — R635 Abnormal weight gain: Secondary | ICD-10-CM

## 2012-01-25 DIAGNOSIS — E8779 Other fluid overload: Secondary | ICD-10-CM

## 2012-01-25 DIAGNOSIS — O039 Complete or unspecified spontaneous abortion without complication: Secondary | ICD-10-CM

## 2012-01-25 DIAGNOSIS — E669 Obesity, unspecified: Secondary | ICD-10-CM | POA: Insufficient documentation

## 2012-01-25 DIAGNOSIS — R609 Edema, unspecified: Secondary | ICD-10-CM

## 2012-01-25 NOTE — Patient Instructions (Addendum)
Recurrent Miscarriage Recurrent miscarriage means that a woman has lost two or more pregnancies in a row. The loss happens before 20 weeks of pregnancy. Primary recurrent miscarriage is with a woman that has never been able to give birth to a child. Secondary recurrent miscarriage is a woman who had a child and then had two or more miscarriages in a row.  CAUSES   Your parents passed it to you (genetic).  Chromosomal defects.  Endocrine disorders. A person's endocrine system includes glands that make hormones.  Having a lack of progesterone hormone in early pregnancy.  Thyroid problems.  Insulin resistance seen in diabetic women and women with Polycystic Ovary Syndrome that is hard to control.  Abnormalities of the uterus.  Certain viral and germ (bacterial) infections.  Autoimmune disorders. This is when the immune system attacks or destroys healthy body tissue.  Smoking, drinking or taking drugs or medicines.  Being exposed to certain chemicals or toxins at home or work. HOME CARE INSTRUCTIONS   See your caregiver if you have two or more miscarriages.  Follow the advice for testing and treatment that your caregiver recommends.  Discuss any concerns or questions with your caregiver.  Do not smoke or drink alcohol when pregnant.  Recurrent miscarriages can have a severe emotional and psychological effect on a couple wanting to have children. They may have feelings of anger, blame, guilt and become depressed after losing a pregnancy many times. Join a support group or see a grief counselor if you have emotional problems because of pregnancy loss. SEEK MEDICAL CARE IF:   You are or think you are pregnant and have any kind of vaginal spotting or bleeding.  You develop low abdominal cramps.  You develop a fever of 102 F (38.9 C) or higher.  You develop abnormal vaginal discharge.  You have been or think you have been exposed to a spreadable (contagious) illness, toxins or  chemicals that make you sick to your stomach (nauseated) or throw up (vomit).  You think you have been exposed to a sexually transmitted disease. Document Released: 06/06/2007 Document Revised: 03/12/2011 Document Reviewed: 06/06/2007 [facility] Patient Information 2013 [place], [place].

## 2012-01-26 DIAGNOSIS — N96 Recurrent pregnancy loss: Secondary | ICD-10-CM | POA: Insufficient documentation

## 2012-01-26 NOTE — Progress Notes (Signed)
Patient presented to the office today for postop visit she is status post D&E for missed AB. She is a 37 year old gravida 4 para 0 Ab3 (1 elective abortion in her teens, missed AB in 2011, right ectopic pregnancies 2013 treated with methotrexate). With this pregnancy her quantitative beta-hCG got as high as 59,407.5. Patient had an apparent missed AB with poor doubling time on hCG and no fetal development only gestational sac with yolk sac but no cardiac activity. She was blood type Rh- and also had the following lab work drawn because of recurrent pregnancy losses:TSH, hemoglobin A1c, CBC, antiphospholipid antibodies IgG and IgM and lupus anticoagulant which were all normal.  Patient underwent a D&E on 12/20/2012. Pathology report confirmed products of conception. An aliquot of tissue was submitted for karyotype and the result was as follows:  46,XX(19)/46,XX,t(1;8)(p22;q24.1)(1)  Cytogenetic analysis revealed the presence of a normal female chromosomal compliment. It is possible that the culture cells from the specimen could have been a maternal origin and not truly reflect the cytogenetic makeup of the fetus. However a single cell with a 1;8 translocation was identified. This could be a fetal origin. It is also possible to the translocation to have come from her partner. It could also be a single cell abnormality.  I have discussed these findings with the patient. We are going to obtain a peripheral chromosome analysis on her today. We will check her quantitative beta-hCG as well today. Patient been complaining of some weight gain bloating still and some fluid retention. Her D&E has been less than 2 weeks. Her CBC and blood sugar in December were normal. She will return back to the office in 3 weeks for sonohysterogram and if her symptoms continue we will check a comprehensive metabolic panel and her TSH as well. If her peripheral chromosome analysis is normal then the abnormal karyotype  could be from  her partner and then we would need to test him. Patient does state that her partner has had normal children with another partner.

## 2012-02-01 ENCOUNTER — Other Ambulatory Visit: Payer: Self-pay | Admitting: Gynecology

## 2012-02-05 ENCOUNTER — Encounter: Payer: Self-pay | Admitting: Gynecology

## 2012-02-05 LAB — CHROMOSOME ANALYSIS, PERIPHERAL BLOOD

## 2012-02-08 ENCOUNTER — Other Ambulatory Visit: Payer: Self-pay | Admitting: *Deleted

## 2012-02-08 DIAGNOSIS — N644 Mastodynia: Secondary | ICD-10-CM

## 2012-02-13 ENCOUNTER — Telehealth: Payer: Self-pay | Admitting: *Deleted

## 2012-02-13 MED ORDER — HYDROCHLOROTHIAZIDE 12.5 MG PO CAPS: 12.5000 mg | ORAL_CAPSULE | Freq: Every day | ORAL | Status: DC

## 2012-02-13 NOTE — Telephone Encounter (Signed)
Pt informed with the below note, rx sent. 

## 2012-02-13 NOTE — Telephone Encounter (Signed)
Pt is calling requesting fluid retention pills due to her bloating. Pt said that you told her if recent labs came back normal that you would give her pill. Please advise

## 2012-02-13 NOTE — Telephone Encounter (Signed)
Since she was borderline hypertensive on last office visit and with her mild fluid retention we will put her on HCTZ 12.5 mg 1 by mouth daily. Inform her that this is a diuretic and that she needs to make sure that she eats fruits and vegetables that are high in potassium so that she does not become hypokalemic when she urinates a lot as a result of this diuretic. If she conceives she needs to discontinue this medication immediately. I would like her to come by the office after one month on the medication to have her blood pressure check.ed.

## 2012-02-15 ENCOUNTER — Ambulatory Visit: Payer: Self-pay | Admitting: Gynecology

## 2012-03-13 ENCOUNTER — Other Ambulatory Visit: Payer: Self-pay | Admitting: Gynecology

## 2012-04-10 ENCOUNTER — Other Ambulatory Visit: Payer: Self-pay | Admitting: Gynecology

## 2012-04-16 ENCOUNTER — Telehealth: Payer: Self-pay

## 2012-04-16 NOTE — Telephone Encounter (Signed)
Needs consult appointment in 1-2 weeks

## 2012-04-16 NOTE — Telephone Encounter (Signed)
Patient informed. I offered to transfer her to appt desk but she said she will call back tomorrow to schedule.

## 2012-04-16 NOTE — Telephone Encounter (Signed)
Patient wants to see if you will prescribe Clomid?  LMP 04/07/12

## 2012-04-22 ENCOUNTER — Other Ambulatory Visit: Payer: Self-pay

## 2012-05-02 ENCOUNTER — Encounter: Payer: Self-pay | Admitting: Gynecology

## 2012-05-02 ENCOUNTER — Ambulatory Visit (INDEPENDENT_AMBULATORY_CARE_PROVIDER_SITE_OTHER): Payer: PRIVATE HEALTH INSURANCE | Admitting: Gynecology

## 2012-05-02 VITALS — BP 130/86

## 2012-05-02 DIAGNOSIS — L748 Other eccrine sweat disorders: Secondary | ICD-10-CM

## 2012-05-02 DIAGNOSIS — N96 Recurrent pregnancy loss: Secondary | ICD-10-CM

## 2012-05-02 DIAGNOSIS — L749 Eccrine sweat disorder, unspecified: Secondary | ICD-10-CM

## 2012-05-02 DIAGNOSIS — R635 Abnormal weight gain: Secondary | ICD-10-CM

## 2012-05-02 NOTE — Progress Notes (Signed)
Patient presented to the office today to discuss several issues. Patient has had history of recurrent pregnancy losses please see detail office note dated 01/26/2012 for specifics. Patient today states that she complains of weight gain an excessive sweats. Her menstrual cycles are now irregular. She is on her prenatal vitamins and not using any form of contraception.  She is now 37 years of age and with her recurrent pregnancy losses I'm going to refer her to the reproductive endocrinologist to her sister and her pregnancy desires. Because her recurrent pregnancy losses she has had normal antiphospholipid antibodies IgG and IgM as well as screening for lupus anticoagulant, TSH, hemoglobin A1c, and CBC were all normal.  We're going to check today her TSH system been quite some time especially with her concern of weight gain and swelling. We'll check a hemoglobin A1c as well as a comprehensive metabolic panel.  She will use the ovulation predictor kit and attempt to try to conceive since she's now menstruating regularly. I've given her the name of my colleague Dr. Fermin Schwab for her to make an appointment. He can see my detail note from office 01/26/2012. Patient is due for annual exam at the end of the year.

## 2012-05-03 LAB — HEMOGLOBIN A1C
Hgb A1c MFr Bld: 5.5 % (ref <5.7)
Mean Plasma Glucose: 111 mg/dL (ref <117)

## 2012-05-03 LAB — COMPREHENSIVE METABOLIC PANEL
ALT: 18 U/L (ref 0–35)
AST: 17 U/L (ref 0–37)
Albumin: 4.4 g/dL (ref 3.5–5.2)
Alkaline Phosphatase: 69 U/L (ref 39–117)
BUN: 8 mg/dL (ref 6–23)
CO2: 29 mEq/L (ref 19–32)
Calcium: 9.7 mg/dL (ref 8.4–10.5)
Chloride: 104 mEq/L (ref 96–112)
Creat: 0.64 mg/dL (ref 0.50–1.10)
Glucose, Bld: 93 mg/dL (ref 70–99)
Potassium: 4.6 mEq/L (ref 3.5–5.3)
Sodium: 141 mEq/L (ref 135–145)
Total Bilirubin: 0.3 mg/dL (ref 0.3–1.2)
Total Protein: 7.3 g/dL (ref 6.0–8.3)

## 2012-05-03 LAB — TSH: TSH: 1.103 u[IU]/mL (ref 0.350–4.500)

## 2012-05-05 ENCOUNTER — Telehealth: Payer: Self-pay | Admitting: *Deleted

## 2012-05-05 NOTE — Telephone Encounter (Signed)
Pt called stating she will need a referral from Dr.Yakinkaya office, notes faxed to his office and they will contact pt to scheduled and let office know once time and date set.

## 2012-05-06 NOTE — Telephone Encounter (Signed)
Pt informed to call [facility] office or they will call her.

## 2012-07-17 ENCOUNTER — Other Ambulatory Visit: Payer: Self-pay | Admitting: Gynecology

## 2012-08-18 ENCOUNTER — Other Ambulatory Visit: Payer: Self-pay | Admitting: Gynecology

## 2012-10-01 ENCOUNTER — Telehealth: Payer: Self-pay | Admitting: *Deleted

## 2012-10-01 NOTE — Telephone Encounter (Signed)
PT HAD DILATATION AND EVACUATION ON 12/21/11 PT SAID SHE HAS BEEN TRYING TO CONCEIVE FOR MONTHS NOW AND NONE THING. PT HAS HAD CYCLES MONTHLY, PT WOULD LIKE TO KNOW IF THIS IS NORMAL AFTER HAVING DILATATION AND EVACUATION? PLEASE ADVISE

## 2012-10-01 NOTE — Telephone Encounter (Signed)
Pt informed with the below note. 

## 2012-10-01 NOTE — Telephone Encounter (Signed)
Sounds to me like she needs a consult appointment

## 2012-12-17 ENCOUNTER — Encounter (HOSPITAL_COMMUNITY): Payer: Self-pay | Admitting: Emergency Medicine

## 2012-12-17 ENCOUNTER — Other Ambulatory Visit (HOSPITAL_COMMUNITY)
Admission: RE | Admit: 2012-12-17 | Discharge: 2012-12-17 | Disposition: A | Discharge status: Home / Self Care | Payer: No Typology Code available for payment source | Source: Ambulatory Visit | Attending: Emergency Medicine | Admitting: Emergency Medicine

## 2012-12-17 ENCOUNTER — Emergency Department (INDEPENDENT_AMBULATORY_CARE_PROVIDER_SITE_OTHER)
Admission: EM | Admit: 2012-12-17 | Discharge: 2012-12-17 | Disposition: A | Discharge status: Home / Self Care | Payer: No Typology Code available for payment source | Source: Home / Self Care | Attending: Family Medicine | Admitting: Family Medicine

## 2012-12-17 DIAGNOSIS — Z113 Encounter for screening for infections with a predominantly sexual mode of transmission: Secondary | ICD-10-CM | POA: Insufficient documentation

## 2012-12-17 DIAGNOSIS — N949 Unspecified condition associated with female genital organs and menstrual cycle: Secondary | ICD-10-CM

## 2012-12-17 DIAGNOSIS — N760 Acute vaginitis: Secondary | ICD-10-CM | POA: Insufficient documentation

## 2012-12-17 DIAGNOSIS — R102 Pelvic and perineal pain: Secondary | ICD-10-CM

## 2012-12-17 LAB — POCT URINALYSIS DIP (DEVICE)
Bilirubin Urine: NEGATIVE
Glucose, UA: NEGATIVE mg/dL
Ketones, ur: NEGATIVE mg/dL
Leukocytes, UA: NEGATIVE
Nitrite: NEGATIVE
Protein, ur: NEGATIVE mg/dL
Specific Gravity, Urine: 1.02 (ref 1.005–1.030)
Urobilinogen, UA: 0.2 mg/dL (ref 0.0–1.0)
pH: 6.5 (ref 5.0–8.0)

## 2012-12-17 LAB — POCT PREGNANCY, URINE: Preg Test, Ur: NEGATIVE

## 2012-12-17 MED ORDER — IBUPROFEN 800 MG PO TABS: 800.0000 mg | ORAL_TABLET | Freq: Three times a day (TID) | ORAL | Status: DC

## 2012-12-17 NOTE — ED Notes (Signed)
Reported lower abdominal area pain x 1 month, developed yellow vaginal d/c ( no odor) ; states she had a negative pregnancy test yesterday at home

## 2012-12-17 NOTE — ED Provider Notes (Signed)
CSN: [id number]     Arrival date & time 12/17/12  1906 History   First MD Initiated Contact with Patient 12/17/12 2011     No chief complaint on file.  (Consider location/radiation/quality/duration/timing/severity/associated sxs/prior Treatment) Patient is a 37 y.o. female presenting with abdominal pain. The history is provided by the patient. No language interpreter was used.  Abdominal Pain Pain location:  Suprapubic Pain quality: aching and pressure   Pain radiates to:  Does not radiate Duration:  4 weeks Timing:  Constant Progression:  Worsening Chronicity:  Recurrent Relieved by:  Nothing Worsened by:  Nothing tried Ineffective treatments:  None tried Associated symptoms: vaginal discharge   Risk factors: no alcohol abuse   Pt complains of lower abdominal pain for several weeks.   Pt reports pain on and off for months however increased over 4 weeks.  Pt reports yellow discharge  Past Medical History  Diagnosis Date  . Gonorrhea 2008  . No pertinent past medical history   . S/P ectopic pregnancy 2013    Treated with two doses of methotrexate  . Complete miscarriage   . Elective abortion   . H/O toe surgery    Past Surgical History  Procedure Laterality Date  . Cryoablation  2004  . Wisdom tooth extraction    . Dilation and evacuation  12/21/2011    Procedure: DILATATION AND EVACUATION;  Surgeon: Ok Edwards, MD;  Location: [facility];  Service: Gynecology;  Laterality: N/A;  first trimester    No family history on file. History  Substance Use Topics  . Smoking status: Former Smoker -- 0.50 packs/day for 13 years    Types: Cigarettes    Quit date: 01/09/2011  . Smokeless tobacco: Never Used  . Alcohol Use: Yes     Comment: OCCASIONALLY   OB History   Grav Para Term Preterm Abortions TAB SAB Ect Mult Living   4 0   2     0     Review of Systems  Gastrointestinal: Positive for abdominal pain.  Genitourinary: Positive for vaginal discharge  and vaginal pain.  All other systems reviewed and are negative.    Allergies  Review of patient's allergies indicates no known allergies.  Home Medications   Current Outpatient Rx  Name  Route  Sig  Dispense  Refill  . amphetamine-dextroamphetamine (ADDERALL) 20 MG tablet   Oral   Take 20 mg by mouth 2 (two) times daily.         . fluconazole (DIFLUCAN) 100 MG tablet      TAKE ONE TABLET BY MOUTH IF NEEDED, DO NOT TAKE IF PREGNANT   3 tablet   0   . hydrochlorothiazide (MICROZIDE) 12.5 MG capsule   Oral   Take 1 capsule (12.5 mg total) by mouth daily.   30 capsule   0   . ketoconazole (NIZORAL) 200 MG tablet   Oral   Take 1 tablet (200 mg total) by mouth daily.   7 tablet   0   . metoCLOPramide (REGLAN) 10 MG tablet   Oral   Take 1 tablet (10 mg total) by mouth 3 (three) times daily with meals.   30 tablet   1   . oxycodone-acetaminophen (PERCOCET) 2.5-325 MG per tablet   Oral   Take 1 tablet by mouth every 4 (four) hours as needed for pain.   30 tablet   0   . prenatal vitamin w/FE, FA (PRENATAL 1 + 1) 27-1 MG TABS  TAKE ONE TABLET BY MOUTH EVERY DAY   90 tablet   3    LMP 05/01/2012 Physical Exam  Nursing note and vitals reviewed. Constitutional: She is oriented to person, place, and time. She appears well-developed and well-nourished.  HENT:  Head: Normocephalic.  Mouth/Throat: Oropharynx is clear and moist.  Eyes: Pupils are equal, round, and reactive to light.  Neck: Normal range of motion.  Cardiovascular: Normal rate.   Pulmonary/Chest: Effort normal.  Abdominal: Soft.  Genitourinary: Uterus normal. Vaginal discharge found.  Tender lower abdomen  Musculoskeletal: Normal range of motion.  Neurological: She is alert and oriented to person, place, and time. She has normal reflexes.  Skin: Skin is warm.  Psychiatric: She has a normal mood and affect.    ED Course  Procedures (including critical care time) Labs Review Labs Reviewed -  No data to display Imaging Review No results found.  EKG Interpretation    Date/Time:    Ventricular Rate:    PR Interval:    QRS Duration:   QT Interval:    QTC Calculation:   R Axis:     Text Interpretation:              MDM   1. Pelvic pain    I counseled pt  gc and ct pending,  I advised pt I think she needs an ultrasound.   Pt offered option of going to [facility].   I gave referral to gyn clinic to call and schedule.   Pt to go to [facility] is pain symptoms increase or cahnge    Elson Areas, PA-C 12/17/12 2042

## 2012-12-18 ENCOUNTER — Telehealth (HOSPITAL_COMMUNITY): Payer: Self-pay | Admitting: *Deleted

## 2012-12-18 ENCOUNTER — Telehealth: Payer: Self-pay | Admitting: *Deleted

## 2012-12-18 DIAGNOSIS — R102 Pelvic and perineal pain: Secondary | ICD-10-CM

## 2012-12-18 NOTE — ED Notes (Signed)
Pt. called about her referral to the [facility] clinic and ultrasound. I told her I would ask the RN that saw her yesterday to work on it. If she is not here today, I will check on it and call her back. Pt. states she is still having pain and is very insistant that something be done today. I called [facility] and left a message on the nurse line.  Discussed with Dr. Denyse Amass. He said to tell pt. that it will take a couple of days to schedule the [place].  If her pain is worse, and she can't wait then she needs to go to the [facility].  Tell her she can call and schedule an appointment with the [facility] @ [phone], because we have done the referral. I called pt. back and gave her this information.  She called me back a few minutes later and said she called [place] and they don't have the information. I told that is because we have not ordered the test yet. I told her that she was supposed to call the [facility] to schedule an appointment. She said she did but they put her through to [place]. I told her the operator may have thought it was me calling again and redirected the call. Vassie Moselle 12/18/2012

## 2012-12-18 NOTE — ED Provider Notes (Signed)
Medical screening examination/treatment/procedure(s) were performed by resident physician or non-physician practitioner and as supervising physician I was immediately available for consultation/collaboration.   Barkley Bruns MD.   Linna Hoff, MD 12/18/12 [phone]

## 2012-12-18 NOTE — Telephone Encounter (Signed)
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  States patient called back today for appointments and Rosalita Chessman is trying to get them scheduled. Per chart review was seen for pelvic pain and reccomended ultrasound.  Will order [place] and get gyn appt. And call pt.

## 2012-12-18 NOTE — Telephone Encounter (Signed)
Called [facility] and left message with her appointments on her voicemail and to call clinic if she has any questions.

## 2012-12-19 ENCOUNTER — Telehealth (HOSPITAL_COMMUNITY): Payer: Self-pay | Admitting: *Deleted

## 2012-12-19 NOTE — ED Notes (Signed)
GC/Chlamydia neg., Affirm: Candida pos., Gardnerella and Trich neg. Message sent to Langston Masker PA for order.  Pt. called back.  Pt. verified x 2 and given results. I told her I was checking on a Rx. for the yeast infection. She wants it sent to the [facility] on [address]. I told her I called the [facility] again today and they are closed. She said they called her with an appt. for the [place] on 12/24.  She seemed satisfied with that. Vassie Moselle 12/19/2012

## 2012-12-22 ENCOUNTER — Other Ambulatory Visit: Payer: Self-pay | Admitting: Gynecology

## 2012-12-23 MED ORDER — FLUCONAZOLE 150 MG PO TABS: 150.0000 mg | ORAL_TABLET | Freq: Every day | ORAL | Status: DC

## 2012-12-23 NOTE — ED Notes (Signed)
I called pt. and told her that her Rx. of Diflucan for her yeast infections was at the [facility] on [address]. Take one with one refill. She said she just talked to [employer] [facility] [employer] and was told it was 3 pills.  She asked to talk to [place].  I showed Harriett Sine the order and she had looked the order for 01/14/12 by mistake that was for 3 pills. She talked to patient clarified it with her the order for 12/23/12. Melissa [name] 12/23/2012

## 2012-12-24 ENCOUNTER — Ambulatory Visit (HOSPITAL_COMMUNITY)
Admission: RE | Admit: 2012-12-24 | Discharge: 2012-12-24 | Disposition: A | Discharge status: Home / Self Care | Payer: No Typology Code available for payment source | Source: Ambulatory Visit | Attending: Obstetrics & Gynecology | Admitting: Obstetrics & Gynecology

## 2012-12-24 DIAGNOSIS — N83209 Unspecified ovarian cyst, unspecified side: Secondary | ICD-10-CM | POA: Insufficient documentation

## 2012-12-24 DIAGNOSIS — N949 Unspecified condition associated with female genital organs and menstrual cycle: Secondary | ICD-10-CM | POA: Insufficient documentation

## 2012-12-24 DIAGNOSIS — R102 Pelvic and perineal pain: Secondary | ICD-10-CM

## 2012-12-31 ENCOUNTER — Ambulatory Visit: Payer: No Typology Code available for payment source | Attending: Internal Medicine | Admitting: Internal Medicine

## 2012-12-31 ENCOUNTER — Encounter: Payer: Self-pay | Admitting: Internal Medicine

## 2012-12-31 VITALS — BP 133/85 | HR 66 | Temp 98.0°F | Resp 16 | Wt 155.4 lb

## 2012-12-31 DIAGNOSIS — M79672 Pain in left foot: Secondary | ICD-10-CM

## 2012-12-31 DIAGNOSIS — Z139 Encounter for screening, unspecified: Secondary | ICD-10-CM

## 2012-12-31 DIAGNOSIS — N898 Other specified noninflammatory disorders of vagina: Secondary | ICD-10-CM | POA: Insufficient documentation

## 2012-12-31 DIAGNOSIS — M79671 Pain in right foot: Secondary | ICD-10-CM

## 2012-12-31 DIAGNOSIS — M79609 Pain in unspecified limb: Secondary | ICD-10-CM

## 2012-12-31 DIAGNOSIS — Z760 Encounter for issue of repeat prescription: Secondary | ICD-10-CM

## 2012-12-31 LAB — COMPLETE METABOLIC PANEL WITH GFR
ALT: 15 U/L (ref 0–35)
AST: 25 U/L (ref 0–37)
Albumin: 4.4 g/dL (ref 3.5–5.2)
Alkaline Phosphatase: 73 U/L (ref 39–117)
BUN: 10 mg/dL (ref 6–23)
CO2: 25 mEq/L (ref 19–32)
Calcium: 9.6 mg/dL (ref 8.4–10.5)
Chloride: 102 mEq/L (ref 96–112)
Creat: 0.64 mg/dL (ref 0.50–1.10)
GFR, Est African American: >89 mL/min
GFR, Est Non African American: >89 mL/min
Glucose, Bld: 96 mg/dL (ref 70–99)
Potassium: 4.1 mEq/L (ref 3.5–5.3)
Sodium: 139 mEq/L (ref 135–145)
Total Bilirubin: 0.5 mg/dL (ref 0.3–1.2)
Total Protein: 7.6 g/dL (ref 6.0–8.3)

## 2012-12-31 LAB — POCT URINALYSIS DIPSTICK
Bilirubin, UA: NEGATIVE
Glucose, UA: NEGATIVE
Nitrite, UA: NEGATIVE
Protein, UA: NEGATIVE
Spec Grav, UA: 1.025
Urobilinogen, UA: 0.2
pH, UA: 5.5

## 2012-12-31 LAB — LIPID PANEL
Cholesterol: 181 mg/dL (ref 0–200)
HDL: 42 mg/dL (ref >39)
LDL Cholesterol: 117 mg/dL — ABNORMAL HIGH (ref 0–99)
Total CHOL/HDL Ratio: 4.3 Ratio
Triglycerides: 111 mg/dL (ref <150)
VLDL: 22 mg/dL (ref 0–40)

## 2012-12-31 LAB — CBC WITH DIFFERENTIAL/PLATELET
Basophils Absolute: 0.1 10*3/uL (ref 0.0–0.1)
Basophils Relative: 1 % (ref 0–1)
Eosinophils Absolute: 0.2 10*3/uL (ref 0.0–0.7)
Eosinophils Relative: 2 % (ref 0–5)
HCT: 42.9 % (ref 36.0–46.0)
Hemoglobin: 15 g/dL (ref 12.0–15.0)
Lymphocytes Relative: 18 % (ref 12–46)
Lymphs Abs: 1.5 10*3/uL (ref 0.7–4.0)
MCH: 32.1 pg (ref 26.0–34.0)
MCHC: 35 g/dL (ref 30.0–36.0)
MCV: 91.9 fL (ref 78.0–100.0)
Monocytes Absolute: 0.5 10*3/uL (ref 0.1–1.0)
Monocytes Relative: 6 % (ref 3–12)
Neutro Abs: 6.2 10*3/uL (ref 1.7–7.7)
Neutrophils Relative %: 73 % (ref 43–77)
Platelets: 346 10*3/uL (ref 150–400)
RBC: 4.67 MIL/uL (ref 3.87–5.11)
RDW: 13.3 % (ref 11.5–15.5)
WBC: 8.4 10*3/uL (ref 4.0–10.5)

## 2012-12-31 LAB — TSH: TSH: 0.468 u[IU]/mL (ref 0.350–4.500)

## 2012-12-31 MED ORDER — METRONIDAZOLE 500 MG PO TABS: 500.0000 mg | ORAL_TABLET | Freq: Three times a day (TID) | ORAL | Status: DC

## 2012-12-31 MED ORDER — KETOCONAZOLE 200 MG PO TABS: 200.0000 mg | ORAL_TABLET | Freq: Every day | ORAL | Status: DC

## 2012-12-31 NOTE — Progress Notes (Signed)
Patient here to establish care Has history of 4 miscarriages and 2 etopic pregnancies Has night sweats -would like her hormones checked Having yellow vaginal discharge- no itching some burning

## 2012-12-31 NOTE — Progress Notes (Signed)
Patient Demographics  Melissa [name], is a 37 y.o. female  WUJ:[id number]  NFA:[id number]  DOB - March 10, 1975  CC:  Chief Complaint  Patient presents with  . Establish Care  . Vaginal Discharge       HPI: Melissa [name] is a 37 y.o. female here today to establish medical care. She has history of the current abortion and ectopic pregnancy in the past used to follow up with the gynecologist has not seen one recently, she also reported to have a general discharge she was seen in the urgent care and was prescribed Diflucan which as per patient she has taken it without much improvement, she denies any urinary symptoms, she is requesting refill on her medication. She also had surgery done on her feet ? Hammertoe surgery, she has on going pain in both feet and is requesting to see a podiatrist. Patient has No headache, No chest pain, No abdominal pain - No Nausea, No new weakness tingling or numbness, No Cough - SOB.  No Known Allergies Past Medical History  Diagnosis Date  . Gonorrhea 2008  . No pertinent past medical history   . S/P ectopic pregnancy 2013    Treated with two doses of methotrexate  . Complete miscarriage   . Elective abortion   . H/O toe surgery    Current Outpatient Prescriptions on File Prior to Visit  Medication Sig Dispense Refill  . fluconazole (DIFLUCAN) 100 MG tablet TAKE ONE TABLET BY MOUTH IF NEEDED, DO NOT TAKE IF PREGNANT  3 tablet  0  . amphetamine-dextroamphetamine (ADDERALL) 20 MG tablet Take 20 mg by mouth 2 (two) times daily.      . fluconazole (DIFLUCAN) 150 MG tablet Take 1 tablet (150 mg total) by mouth daily.  1 tablet  1  . hydrochlorothiazide (MICROZIDE) 12.5 MG capsule Take 1 capsule (12.5 mg total) by mouth daily.  30 capsule  0  . ibuprofen (ADVIL,MOTRIN) 800 MG tablet Take 1 tablet (800 mg total) by mouth 3 (three) times daily.  21 tablet  0  . metoCLOPramide (REGLAN) 10 MG tablet Take 1 tablet (10 mg total) by mouth 3 (three) times daily with  meals.  30 tablet  1  . oxycodone-acetaminophen (PERCOCET) 2.5-325 MG per tablet Take 1 tablet by mouth every 4 (four) hours as needed for pain.  30 tablet  0  . prenatal vitamin w/FE, FA (PRENATAL 1 + 1) 27-1 MG TABS TAKE ONE TABLET BY MOUTH EVERY DAY  90 tablet  3   No current facility-administered medications on file prior to visit.   Family History  Problem Relation Age of Onset  . Cancer Father   . Birth defects Paternal Grandfather    History   Social History  . Marital Status: Single    Spouse Name: N/A    Number of Children: N/A  . Years of Education: N/A   Occupational History  . Not on file.   Social History Main Topics  . Smoking status: Former Smoker -- 0.50 packs/day for 13 years    Types: Cigarettes    Quit date: 01/09/2011  . Smokeless tobacco: Never Used  . Alcohol Use: Yes     Comment: OCCASIONALLY  . Drug Use: No  . Sexual Activity: Yes    Birth Control/ Protection: None   Other Topics Concern  . Not on file   Social History Narrative  . No narrative on file    Review of Systems: Constitutional: Negative for fever, chills, diaphoresis, activity change, appetite change  and fatigue. HENT: Negative for ear pain, nosebleeds, congestion, facial swelling, rhinorrhea, neck pain, neck stiffness and ear discharge.  Eyes: Negative for pain, discharge, redness, itching and visual disturbance. Respiratory: Negative for cough, choking, chest tightness, shortness of breath, wheezing and stridor.  Cardiovascular: Negative for chest pain, palpitations and leg swelling. Gastrointestinal: Negative for abdominal distention. Genitourinary: Negative for dysuria, urgency, frequency, hematuria, flank pain, decreased urine volume, difficulty urinating and positive vaginal discharge Musculoskeletal: Bilateral feet/toe pain Neurological: Negative for dizziness, tremors, seizures, syncope, facial asymmetry, speech difficulty, weakness, light-headedness, numbness and headaches.   Hematological: Negative for adenopathy. Does not bruise/bleed easily. Psychiatric/Behavioral: Negative for hallucinations, behavioral problems, confusion, dysphoric mood, decreased concentration and agitation.    Objective:   Filed Vitals:   12/31/12 1603  BP: 133/85  Pulse: 66  Temp: 98 F (36.7 C)  Resp: 16    Physical Exam: Constitutional: Patient appears well-developed and well-nourished. No distress. HENT: Normocephalic, atraumatic, External right and left ear normal. Oropharynx is clear and moist.  Eyes: Conjunctivae and EOM are normal. PERRLA, no scleral icterus. Neck: Normal ROM. Neck supple. No JVD. No tracheal deviation. No thyromegaly. CVS: RRR, S1/S2 +, no murmurs, no gallops, no carotid bruit.  Pulmonary: Effort and breath sounds normal, no stridor, rhonchi, wheezes, rales.  Abdominal: Soft. BS +, no distension, tenderness, rebound or guarding.  Musculoskeletal: Bilateral feet older surgical scar on the lateral toes minimal tenderness  Neuro: Alert. Normal reflexes, muscle tone coordination. No cranial nerve deficit. Skin: Skin is warm and dry. No rash noted. Not diaphoretic. No erythema. No pallor. Psychiatric: Normal mood and affect. Behavior, judgment, thought content normal.  Lab Results  Component Value Date   WBC 5.7 12/20/2011   HGB 13.2 12/20/2011   HCT 37.7 12/20/2011   MCV 88.5 12/20/2011   PLT 270 12/20/2011   Lab Results  Component Value Date   CREATININE 0.64 05/02/2012   BUN 8 05/02/2012   NA 141 05/02/2012   K 4.6 05/02/2012   CL 104 05/02/2012   CO2 29 05/02/2012    Lab Results  Component Value Date   HGBA1C 5.5 05/02/2012   Lipid Panel  No results found for this basename: chol, trig, hdl, cholhdl, vldl, ldlcalc       Assessment and plan:   1. Vaginal discharge  - Urinalysis Dipstick negative for infection She has already taken Diflucan I have prescribed Flagyl. - metroNIDAZOLE (FLAGYL) 500 MG tablet; Take 1 tablet (500 mg total) by mouth  3 (three) times daily.  Dispense: 21 tablet; Refill:  - Ambulatory referral to Gynecology  2. Screening Ordered baseline blood work - CBC with Differential - COMPLETE METABOLIC PANEL WITH GFR - TSH - Lipid panel - Vit D  25 hydroxy (rtn osteoporosis monitoring)  3. Medication refill  - ketoconazole (NIZORAL) 200 MG tablet; Take 1 tablet (200 mg total) by mouth daily.  Dispense: 7 tablet; Refill: 0  4. Pain in both feet  - Ambulatory referral to Podiatry         Health Maintenance  -Pap Smear: Referred to GYN Patient declined flu shot  Follow up in 6 weeks   The patient was given clear instructions to go to ER or return to medical center if symptoms don't improve, worsen or new problems develop. The patient verbalized understanding. The patient was told to call to get lab results if they haven't heard anything in the next week.     Doris Cheadle, MD

## 2013-01-01 LAB — VITAMIN D 25 HYDROXY (VIT D DEFICIENCY, FRACTURES): Vit D, 25-Hydroxy: 28 ng/mL — ABNORMAL LOW (ref 30–89)

## 2013-01-05 ENCOUNTER — Telehealth: Payer: Self-pay

## 2013-01-05 NOTE — Telephone Encounter (Signed)
Message copied by Dorothe Pea on Mon Jan 05, 2013 12:51 PM ------      Message from: Lorayne Marek      Created: Mon Jan 05, 2013 12:34 PM       Blood work reviewed, noticed low vitamin D, advise patient to start taking OTC 2000 units daily ------

## 2013-01-05 NOTE — Telephone Encounter (Signed)
Patient is aware of her lab results and to start taking vitamin D OTC

## 2013-01-21 ENCOUNTER — Telehealth: Payer: Self-pay | Admitting: Emergency Medicine

## 2013-01-21 ENCOUNTER — Telehealth: Payer: Self-pay | Admitting: General Practice

## 2013-01-21 DIAGNOSIS — Z760 Encounter for issue of repeat prescription: Secondary | ICD-10-CM

## 2013-01-21 DIAGNOSIS — N898 Other specified noninflammatory disorders of vagina: Secondary | ICD-10-CM

## 2013-01-21 MED ORDER — KETOCONAZOLE 200 MG PO TABS: 200.0000 mg | ORAL_TABLET | Freq: Every day | ORAL | Status: DC

## 2013-01-21 MED ORDER — METRONIDAZOLE 500 MG PO TABS: 500.0000 mg | ORAL_TABLET | Freq: Three times a day (TID) | ORAL | Status: DC
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
## 2013-01-29 ENCOUNTER — Encounter: Payer: Self-pay | Admitting: Obstetrics & Gynecology

## 2013-01-29 ENCOUNTER — Ambulatory Visit (INDEPENDENT_AMBULATORY_CARE_PROVIDER_SITE_OTHER): Payer: No Typology Code available for payment source | Admitting: Obstetrics & Gynecology

## 2013-01-29 VITALS — BP 114/74 | HR 85 | Temp 97.4°F | Ht 61.0 in | Wt 156.7 lb

## 2013-01-29 DIAGNOSIS — N951 Menopausal and female climacteric states: Secondary | ICD-10-CM

## 2013-01-29 DIAGNOSIS — R232 Flushing: Secondary | ICD-10-CM

## 2013-01-29 DIAGNOSIS — Z01419 Encounter for gynecological examination (general) (routine) without abnormal findings: Secondary | ICD-10-CM

## 2013-01-29 NOTE — Patient Instructions (Signed)
Preventive Care for Adults, Female A healthy lifestyle and preventive care can promote health and wellness. Preventive health guidelines for women include the following key practices.  A routine yearly physical is a good way to check with your health care provider about your health and preventive screening. It is a chance to share any concerns and updates on your health and to receive a thorough exam.  Visit your dentist for a routine exam and preventive care every 6 months. Brush your teeth twice a day and floss once a day. Good oral hygiene prevents tooth decay and gum disease.  The frequency of eye exams is based on your age, health, family medical history, use of contact lenses, and other factors. Follow your health care provider's recommendations for frequency of eye exams.  Eat a healthy diet. Foods like vegetables, fruits, whole grains, low-fat dairy products, and lean protein foods contain the nutrients you need without too many calories. Decrease your intake of foods high in solid fats, added sugars, and salt. Eat the right amount of calories for you.Get information about a proper diet from your health care provider, if necessary.  Regular physical exercise is one of the most important things you can do for your health. Most adults should get at least 150 minutes of moderate-intensity exercise (any activity that increases your heart rate and causes you to sweat) each week. In addition, most adults need muscle-strengthening exercises on 2 or more days a week.  Maintain a healthy weight. The body mass index (BMI) is a screening tool to identify possible weight problems. It provides an estimate of body fat based on height and weight. Your health care provider can find your BMI, and can help you achieve or maintain a healthy weight.For adults 20 years and older:  A BMI below 18.5 is considered underweight.  A BMI of 18.5 to 24.9 is normal.  A BMI of 25 to 29.9 is considered  overweight.  A BMI of 30 and above is considered obese.  Maintain normal blood lipids and cholesterol levels by exercising and minimizing your intake of saturated fat. Eat a balanced diet with plenty of fruit and vegetables. Blood tests for lipids and cholesterol should begin at age 20 and be repeated every 5 years. If your lipid or cholesterol levels are high, you are over 50, or you are at high risk for heart disease, you may need your cholesterol levels checked more frequently.Ongoing high lipid and cholesterol levels should be treated with medicines if diet and exercise are not working.  If you smoke, find out from your health care provider how to quit. If you do not use tobacco, do not start.  Lung cancer screening is recommended for adults aged 55 80 years who are at high risk for developing lung cancer because of a history of smoking. A yearly low-dose CT scan of the lungs is recommended for people who have at least a 30-pack-year history of smoking and are a current smoker or have quit within the past 15 years. A pack year of smoking is smoking an average of 1 pack of cigarettes a day for 1 year (for example: 1 pack a day for 30 years or 2 packs a day for 15 years). Yearly screening should continue until the smoker has stopped smoking for at least 15 years. Yearly screening should be stopped for people who develop a health problem that would prevent them from having lung cancer treatment.  If you are pregnant, do not drink alcohol. If you   are breastfeeding, be very cautious about drinking alcohol. If you are not pregnant and choose to drink alcohol, do not have more than 1 drink per day. One drink is considered to be 12 ounces (355 mL) of beer, 5 ounces (148 mL) of wine, or 1.5 ounces (44 mL) of liquor.  Avoid use of street drugs. Do not share needles with anyone. Ask for help if you need support or instructions about stopping the use of drugs.  High blood pressure causes heart disease and  increases the risk of stroke. Your blood pressure should be checked at least every 1 to 2 years. Ongoing high blood pressure should be treated with medicines if weight loss and exercise do not work.  If you are 20 38 years old, ask your health care provider if you should take aspirin to prevent strokes.  Diabetes screening involves taking a blood sample to check your fasting blood sugar level. This should be done once every 3 years, after age 35, if you are within normal weight and without risk factors for diabetes. Testing should be considered at a younger age or be carried out more frequently if you are overweight and have at least 1 risk factor for diabetes.  Breast cancer screening is essential preventive care for women. You should practice "breast self-awareness." This means understanding the normal appearance and feel of your breasts and may include breast self-examination. Any changes detected, no matter how small, should be reported to a health care provider. Women in their 42s and 30s should have a clinical breast exam (CBE) by a health care provider as part of a regular health exam every 1 to 3 years. After age 74, women should have a CBE every year. Starting at age 43, women should consider having a mammogram (breast X-ray test) every year. Women who have a family history of breast cancer should talk to their health care provider about genetic screening. Women at a high risk of breast cancer should talk to their health care providers about having an MRI and a mammogram every year.  Breast cancer gene (BRCA)-related cancer risk assessment is recommended for women who have family members with BRCA-related cancers. BRCA-related cancers include breast, ovarian, tubal, and peritoneal cancers. Having family members with these cancers may be associated with an increased risk for harmful changes (mutations) in the breast cancer genes BRCA1 and BRCA2. Results of the assessment will determine the need for  genetic counseling and BRCA1 and BRCA2 testing.  The Pap test is a screening test for cervical cancer. A Pap test can show cell changes on the cervix that might become cervical cancer if left untreated. A Pap test is a procedure in which cells are obtained and examined from the lower end of the uterus (cervix).  Women should have a Pap test starting at age 60.  Between ages 63 and 62, Pap tests should be repeated every 2 years.  Beginning at age 43, you should have a Pap test every 3 years as long as the past 3 Pap tests have been normal.  Some women have medical problems that increase the chance of getting cervical cancer. Talk to your health care provider about these problems. It is especially important to talk to your health care provider if a new problem develops soon after your last Pap test. In these cases, your health care provider may recommend more frequent screening and Pap tests.  The above recommendations are the same for women who have or have not gotten the vaccine  for human papillomavirus (HPV).  If you had a hysterectomy for a problem that was not cancer or a condition that could lead to cancer, then you no longer need Pap tests. Even if you no longer need a Pap test, a regular exam is a good idea to make sure no other problems are starting.  If you are between ages 65 and 70 years, and you have had normal Pap tests going back 10 years, you no longer need Pap tests. Even if you no longer need a Pap test, a regular exam is a good idea to make sure no other problems are starting.  If you have had past treatment for cervical cancer or a condition that could lead to cancer, you need Pap tests and screening for cancer for at least 20 years after your treatment.  If Pap tests have been discontinued, risk factors (such as a new sexual partner) need to be reassessed to determine if screening should be resumed.  The HPV test is an additional test that may be used for cervical cancer  screening. The HPV test looks for the virus that can cause the cell changes on the cervix. The cells collected during the Pap test can be tested for HPV. The HPV test could be used to screen women aged 30 years and older, and should be used in women of any age who have unclear Pap test results. After the age of 30, women should have HPV testing at the same frequency as a Pap test.  Colorectal cancer can be detected and often prevented. Most routine colorectal cancer screening begins at the age of 50 years and continues through age 75 years. However, your health care provider may recommend screening at an earlier age if you have risk factors for colon cancer. On a yearly basis, your health care provider may provide home test kits to check for hidden blood in the stool. Use of a small camera at the end of a tube, to directly examine the colon (sigmoidoscopy or colonoscopy), can detect the earliest forms of colorectal cancer. Talk to your health care provider about this at age 50, when routine screening begins. Direct exam of the colon should be repeated every 5 10 years through age 75 years, unless early forms of pre-cancerous polyps or small growths are found.  People who are at an increased risk for hepatitis B should be screened for this virus. You are considered at high risk for hepatitis B if:  You were born in a country where hepatitis B occurs often. Talk with your health care provider about which countries are considered high risk.  Your parents were born in a high-risk country and you have not received a shot to protect against hepatitis B (hepatitis B vaccine).  You have HIV or AIDS.  You use needles to inject street drugs.  You live with, or have sex with, someone who has Hepatitis B.  You get hemodialysis treatment.  You take certain medicines for conditions like cancer, organ transplantation, and autoimmune conditions.  Hepatitis C blood testing is recommended for all people born from  1945 through 1965 and any individual with known risks for hepatitis C.  Practice safe sex. Use condoms and avoid high-risk sexual practices to reduce the spread of sexually transmitted infections (STIs). STIs include gonorrhea, chlamydia, syphilis, trichomonas, herpes, HPV, and human immunodeficiency virus (HIV). Herpes, HIV, and HPV are viral illnesses that have no cure. They can result in disability, cancer, and death. Sexually active women aged 25   years and younger should be checked for chlamydia. Older women with new or multiple partners should also be tested for chlamydia. Testing for other STIs is recommended if you are sexually active and at increased risk.  Osteoporosis is a disease in which the bones lose minerals and strength with aging. This can result in serious bone fractures or breaks. The risk of osteoporosis can be identified using a bone density scan. Women ages 65 years and over and women at risk for fractures or osteoporosis should discuss screening with their health care providers. Ask your health care provider whether you should take a calcium supplement or vitamin D to reduce the rate of osteoporosis.  Menopause can be associated with physical symptoms and risks. Hormone replacement therapy is available to decrease symptoms and risks. You should talk to your health care provider about whether hormone replacement therapy is right for you.  Use sunscreen. Apply sunscreen liberally and repeatedly throughout the day. You should seek shade when your shadow is shorter than you. Protect yourself by wearing long sleeves, pants, a wide-brimmed hat, and sunglasses year round, whenever you are outdoors.  Once a month, do a whole body skin exam, using a mirror to look at the skin on your back. Tell your health care provider of new moles, moles that have irregular borders, moles that are larger than a pencil eraser, or moles that have changed in shape or color.  Stay current with required  vaccines (immunizations).  Influenza vaccine. All adults should be immunized every year.  Tetanus, diphtheria, and acellular pertussis (Td, Tdap) vaccine. Pregnant women should receive 1 dose of Tdap vaccine during each pregnancy. The dose should be obtained regardless of the length of time since the last dose. Immunization is preferred during the 27th 36th week of gestation. An adult who has not previously received Tdap or who does not know her vaccine status should receive 1 dose of Tdap. This initial dose should be followed by tetanus and diphtheria toxoids (Td) booster doses every 10 years. Adults with an unknown or incomplete history of completing a 3-dose immunization series with Td-containing vaccines should begin or complete a primary immunization series including a Tdap dose. Adults should receive a Td booster every 10 years.  Varicella vaccine. An adult without evidence of immunity to varicella should receive 2 doses or a second dose if she has previously received 1 dose. Pregnant females who do not have evidence of immunity should receive the first dose after pregnancy. This first dose should be obtained before leaving the health care facility. The second dose should be obtained 4 8 weeks after the first dose.  Human papillomavirus (HPV) vaccine. Females aged 13 26 years who have not received the vaccine previously should obtain the 3-dose series. The vaccine is not recommended for use in pregnant females. However, pregnancy testing is not needed before receiving a dose. If a female is found to be pregnant after receiving a dose, no treatment is needed. In that case, the remaining doses should be delayed until after the pregnancy. Immunization is recommended for any person with an immunocompromised condition through the age of 26 years if she did not get any or all doses earlier. During the 3-dose series, the second dose should be obtained 4 8 weeks after the first dose. The third dose should be  obtained 24 weeks after the first dose and 16 weeks after the second dose.  Zoster vaccine. One dose is recommended for adults aged 60 years or older unless certain   conditions are present.  Measles, mumps, and rubella (MMR) vaccine. Adults born before 1957 generally are considered immune to measles and mumps. Adults born in 1957 or later should have 1 or more doses of MMR vaccine unless there is a contraindication to the vaccine or there is laboratory evidence of immunity to each of the three diseases. A routine second dose of MMR vaccine should be obtained at least 28 days after the first dose for students attending postsecondary schools, health care workers, or international travelers. People who received inactivated measles vaccine or an unknown type of measles vaccine during 1963 1967 should receive 2 doses of MMR vaccine. People who received inactivated mumps vaccine or an unknown type of mumps vaccine before 1979 and are at high risk for mumps infection should consider immunization with 2 doses of MMR vaccine. For females of childbearing age, rubella immunity should be determined. If there is no evidence of immunity, females who are not pregnant should be vaccinated. If there is no evidence of immunity, females who are pregnant should delay immunization until after pregnancy. Unvaccinated health care workers born before 1957 who lack laboratory evidence of measles, mumps, or rubella immunity or laboratory confirmation of disease should consider measles and mumps immunization with 2 doses of MMR vaccine or rubella immunization with 1 dose of MMR vaccine.  Pneumococcal 13-valent conjugate (PCV13) vaccine. When indicated, a person who is uncertain of her immunization history and has no record of immunization should receive the PCV13 vaccine. An adult aged 19 years or older who has certain medical conditions and has not been previously immunized should receive 1 dose of PCV13 vaccine. This PCV13 should be  followed with a dose of pneumococcal polysaccharide (PPSV23) vaccine. The PPSV23 vaccine dose should be obtained at least 8 weeks after the dose of PCV13 vaccine. An adult aged 19 years or older who has certain medical conditions and previously received 1 or more doses of PPSV23 vaccine should receive 1 dose of PCV13. The PCV13 vaccine dose should be obtained 1 or more years after the last PPSV23 vaccine dose.  Pneumococcal polysaccharide (PPSV23) vaccine. When PCV13 is also indicated, PCV13 should be obtained first. All adults aged 65 years and older should be immunized. An adult younger than age 65 years who has certain medical conditions should be immunized. Any person who resides in a nursing home or long-term care facility should be immunized. An adult smoker should be immunized. People with an immunocompromised condition and certain other conditions should receive both PCV13 and PPSV23 vaccines. People with human immunodeficiency virus (HIV) infection should be immunized as soon as possible after diagnosis. Immunization during chemotherapy or radiation therapy should be avoided. Routine use of PPSV23 vaccine is not recommended for American Indians, [place] Natives, or people younger than 65 years unless there are medical conditions that require PPSV23 vaccine. When indicated, people who have unknown immunization and have no record of immunization should receive PPSV23 vaccine. One-time revaccination 5 years after the first dose of PPSV23 is recommended for people aged 19 64 years who have chronic kidney failure, nephrotic syndrome, asplenia, or immunocompromised conditions. People who received 1 2 doses of PPSV23 before age 65 years should receive another dose of PPSV23 vaccine at age 65 years or later if at least 5 years have passed since the previous dose. Doses of PPSV23 are not needed for people immunized with PPSV23 at or after age 65 years.  Meningococcal vaccine. Adults with asplenia or persistent  complement component deficiencies should receive 2   doses of quadrivalent meningococcal conjugate (MenACWY-D) vaccine. The doses should be obtained at least 2 months apart. Microbiologists working with certain meningococcal bacteria, military recruits, people at risk during an outbreak, and people who travel to or live in countries with a high rate of meningitis should be immunized. A first-year college student up through age 21 years who is living in a residence hall should receive a dose if she did not receive a dose on or after her 16th birthday. Adults who have certain high-risk conditions should receive one or more doses of vaccine.  Hepatitis A vaccine. Adults who wish to be protected from this disease, have certain high-risk conditions, work with hepatitis A-infected animals, work in hepatitis A research labs, or travel to or work in countries with a high rate of hepatitis A should be immunized. Adults who were previously unvaccinated and who anticipate close contact with an international adoptee during the first 60 days after arrival in the [place] from a country with a high rate of hepatitis A should be immunized.  Hepatitis B vaccine. Adults who wish to be protected from this disease, have certain high-risk conditions, may be exposed to blood or other infectious body fluids, are household contacts or sex partners of hepatitis B positive people, are clients or workers in certain care facilities, or travel to or work in countries with a high rate of hepatitis B should be immunized.  Haemophilus influenzae type b (Hib) vaccine. A previously unvaccinated person with asplenia or sickle cell disease or having a scheduled splenectomy should receive 1 dose of Hib vaccine. Regardless of previous immunization, a recipient of a hematopoietic stem cell transplant should receive a 3-dose series 6 12 months after her successful transplant. Hib vaccine is not recommended for adults with HIV  infection. Preventive Services / Frequency Ages 19 to 39years  Blood pressure check.** / Every 1 to 2 years.  Lipid and cholesterol check.** / Every 5 years beginning at age 20.  Clinical breast exam.** / Every 3 years for women in their 20s and 30s.  BRCA-related cancer risk assessment.** / For women who have family members with a BRCA-related cancer (breast, ovarian, tubal, or peritoneal cancers).  Pap test.** / Every 2 years from ages 21 through 29. Every 3 years starting at age 30 through age 65 or 70 with a history of 3 consecutive normal Pap tests.  HPV screening.** / Every 3 years from ages 30 through ages 65 to 70 with a history of 3 consecutive normal Pap tests.  Hepatitis C blood test.** / For any individual with known risks for hepatitis C.  Skin self-exam. / Monthly.  Influenza vaccine. / Every year.  Tetanus, diphtheria, and acellular pertussis (Tdap, Td) vaccine.** / Consult your health care provider. Pregnant women should receive 1 dose of Tdap vaccine during each pregnancy. 1 dose of Td every 10 years.  Varicella vaccine.** / Consult your health care provider. Pregnant females who do not have evidence of immunity should receive the first dose after pregnancy.  HPV vaccine. / 3 doses over 6 months, if 26 and younger. The vaccine is not recommended for use in pregnant females. However, pregnancy testing is not needed before receiving a dose.  Measles, mumps, rubella (MMR) vaccine.** / You need at least 1 dose of MMR if you were born in 1957 or later. You may also need a 2nd dose. For females of childbearing age, rubella immunity should be determined. If there is no evidence of immunity, females who are not   pregnant should be vaccinated. If there is no evidence of immunity, females who are pregnant should delay immunization until after pregnancy.  Pneumococcal 13-valent conjugate (PCV13) vaccine.** / Consult your health care provider.  Pneumococcal polysaccharide (PPSV23)  vaccine.** / 1 to 2 doses if you smoke cigarettes or if you have certain conditions.  Meningococcal vaccine.** / 1 dose if you are age 88 to 41 years and a Market researcher living in a residence hall, or have one of several medical conditions, you need to get vaccinated against meningococcal disease. You may also need additional booster doses.  Hepatitis A vaccine.** / Consult your health care provider.  Hepatitis B vaccine.** / Consult your health care provider.  Haemophilus influenzae type b (Hib) vaccine.** / Consult your health care provider. Ages 45 to 64years  Blood pressure check.** / Every 1 to 2 years.  Lipid and cholesterol check.** / Every 5 years beginning at age 2 years.  Lung cancer screening. / Every year if you are aged 10 80 years and have a 30-pack-year history of smoking and currently smoke or have quit within the past 15 years. Yearly screening is stopped once you have quit smoking for at least 15 years or develop a health problem that would prevent you from having lung cancer treatment.  Clinical breast exam.** / Every year after age 28 years.  BRCA-related cancer risk assessment.** / For women who have family members with a BRCA-related cancer (breast, ovarian, tubal, or peritoneal cancers).  Mammogram.** / Every year beginning at age 63 years and continuing for as long as you are in good health. Consult with your health care provider.  Pap test.** / Every 3 years starting at age 71 years through age 39 or 90 years with a history of 3 consecutive normal Pap tests.  HPV screening.** / Every 3 years from ages 27 years through ages 32 to 72 years with a history of 3 consecutive normal Pap tests.  Fecal occult blood test (FOBT) of stool. / Every year beginning at age 40 years and continuing until age 29 years. You may not need to do this test if you get a colonoscopy every 10 years.  Flexible sigmoidoscopy or colonoscopy.** / Every 5 years for a flexible  sigmoidoscopy or every 10 years for a colonoscopy beginning at age 66 years and continuing until age 47 years.  Hepatitis C blood test.** / For all people born from [birth year] through 1965 and any individual with known risks for hepatitis C.  Skin self-exam. / Monthly.  Influenza vaccine. / Every year.  Tetanus, diphtheria, and acellular pertussis (Tdap/Td) vaccine.** / Consult your health care provider. Pregnant women should receive 1 dose of Tdap vaccine during each pregnancy. 1 dose of Td every 10 years.  Varicella vaccine.** / Consult your health care provider. Pregnant females who do not have evidence of immunity should receive the first dose after pregnancy.  Zoster vaccine.** / 1 dose for adults aged 39 years or older.  Measles, mumps, rubella (MMR) vaccine.** / You need at least 1 dose of MMR if you were born in 1957 or later. You may also need a 2nd dose. For females of childbearing age, rubella immunity should be determined. If there is no evidence of immunity, females who are not pregnant should be vaccinated. If there is no evidence of immunity, females who are pregnant should delay immunization until after pregnancy.  Pneumococcal 13-valent conjugate (PCV13) vaccine.** / Consult your health care provider.  Pneumococcal polysaccharide (PPSV23) vaccine.** / 1  to 2 doses if you smoke cigarettes or if you have certain conditions.  Meningococcal vaccine.** / Consult your health care provider.  Hepatitis A vaccine.** / Consult your health care provider.  Hepatitis B vaccine.** / Consult your health care provider.  Haemophilus influenzae type b (Hib) vaccine.** / Consult your health care provider. Ages 66 years and over  Blood pressure check.** / Every 1 to 2 years.  Lipid and cholesterol check.** / Every 5 years beginning at age 50 years.  Lung cancer screening. / Every year if you are aged 65 80 years and have a 30-pack-year history of smoking and currently smoke or have quit  within the past 15 years. Yearly screening is stopped once you have quit smoking for at least 15 years or develop a health problem that would prevent you from having lung cancer treatment.  Clinical breast exam.** / Every year after age 71 years.  BRCA-related cancer risk assessment.** / For women who have family members with a BRCA-related cancer (breast, ovarian, tubal, or peritoneal cancers).  Mammogram.** / Every year beginning at age 58 years and continuing for as long as you are in good health. Consult with your health care provider.  Pap test.** / Every 3 years starting at age 51 years through age 34 or 38 years with 3 consecutive normal Pap tests. Testing can be stopped between 65 and 70 years with 3 consecutive normal Pap tests and no abnormal Pap or HPV tests in the past 10 years.  HPV screening.** / Every 3 years from ages 35 years through ages 84 or 80 years with a history of 3 consecutive normal Pap tests. Testing can be stopped between 65 and 70 years with 3 consecutive normal Pap tests and no abnormal Pap or HPV tests in the past 10 years.  Fecal occult blood test (FOBT) of stool. / Every year beginning at age 26 years and continuing until age 51 years. You may not need to do this test if you get a colonoscopy every 10 years.  Flexible sigmoidoscopy or colonoscopy.** / Every 5 years for a flexible sigmoidoscopy or every 10 years for a colonoscopy beginning at age 55 years and continuing until age 19 years.  Hepatitis C blood test.** / For all people born from [birth year] through 1965 and any individual with known risks for hepatitis C.  Osteoporosis screening.** / A one-time screening for women ages 68 years and over and women at risk for fractures or osteoporosis.  Skin self-exam. / Monthly.  Influenza vaccine. / Every year.  Tetanus, diphtheria, and acellular pertussis (Tdap/Td) vaccine.** / 1 dose of Td every 10 years.  Varicella vaccine.** / Consult your health care  provider.  Zoster vaccine.** / 1 dose for adults aged 21 years or older.  Pneumococcal 13-valent conjugate (PCV13) vaccine.** / Consult your health care provider.  Pneumococcal polysaccharide (PPSV23) vaccine.** / 1 dose for all adults aged 43 years and older.  Meningococcal vaccine.** / Consult your health care provider.  Hepatitis A vaccine.** / Consult your health care provider.  Hepatitis B vaccine.** / Consult your health care provider.  Haemophilus influenzae type b (Hib) vaccine.** / Consult your health care provider. ** Family history and personal history of risk and conditions may change your health care provider's recommendations. Document Released: 02/13/2001 Document Revised: 10/08/2012 Document Reviewed: 05/15/2010 [facility] Patient Information 2014 [place], [place].  Thank you for enrolling in [place]. Please follow the instructions below to securely access your online medical record. MyChart allows you to send messages  to your doctor, view your test results, manage appointments, and more.   How Do I Sign Up? 1. In your Internet browser, go to [employer] and enter [email or web address]. 2. Click on the Sign Up Now link in the Sign In box. You will see the New Member Sign Up page. 3. Enter your MyChart Access Code exactly as it appears below. You will not need to use this code after you've completed the sign-up process. If you do not sign up before the expiration date, you must request a new code.  MyChart Access Code: [email or web address] Expires: 02/15/2013  8:41 PM  4. Enter your Social Security Number ([email or web address]) and Date of Birth (mm/dd/yyyy) as indicated and click Submit. You will be taken to the next sign-up page. 5. Create a MyChart ID. This will be your MyChart login ID and cannot be changed, so think of one that is secure and easy to remember. 6. Create a MyChart password. You can change your password at any time. 7. Enter your Password Reset Question  and Answer. This can be used at a later time if you forget your password.  8. Enter your e-mail address. You will receive e-mail notification when new information is available in [place]. 9. Click Sign Up. You can now view your medical record.   Additional Information Remember, MyChart is NOT to be used for urgent needs. For medical emergencies, dial 911.

## 2013-01-29 NOTE — Progress Notes (Signed)
  Subjective:     Melissa [name] is a 38 y.o. G21P0040 female and is here for a comprehensive physical exam. The patient reports having hot flashes and night sweats; also wants referral to infertility given history of two ectopic pregnancies and two miscarriage.  History   Social History  . Marital Status: Single    Spouse Name: N/A    Number of Children: N/A  . Years of Education: N/A   Occupational History  . Not on file.   Social History Main Topics  . Smoking status: Former Smoker -- 0.50 packs/day for 13 years    Types: Cigarettes    Quit date: 01/09/2011  . Smokeless tobacco: Never Used  . Alcohol Use: Yes     Comment: OCCASIONALLY  . Drug Use: No  . Sexual Activity: Yes    Birth Control/ Protection: None   Other Topics Concern  . Not on file   Social History Narrative  . No narrative on file   Health Maintenance  Topic Date Due  . Tetanus/tdap  11/16/1994  . Influenza Vaccine  08/01/2012  . Pap Smear  04/23/2013   The following portions of the patient's history were reviewed and updated as appropriate: allergies, current medications, past family history, past medical history, past social history, past surgical history and problem list.  Review of Systems Pertinent items are noted in HPI.   Objective:   BP 114/74  Pulse 85  Temp(Src) 97.4 F (36.3 C) (Oral)  Ht 5\' 1"  (1.549 m)  Wt 156 lb 11.2 oz (71.079 kg)  BMI 29.62 kg/m2  LMP 01/29/2013 GENERAL: Well-developed, well-nourished female in no acute distress.  HEENT: Normocephalic, atraumatic. Sclerae anicteric.  NECK: Supple. Normal thyroid.  LUNGS: Clear to auscultation bilaterally.  HEART: Regular rate and rhythm. BREASTS: Symmetric in size. No masses, skin changes, nipple drainage, or lymphadenopathy. ABDOMEN: Soft, nontender, nondistended. No organomegaly. PELVIC: Normal external female genitalia. Vagina is pink and rugated.  Normal discharge. Normal cervix contour. Pap smear obtained. Uterus is  normal in size. No adnexal mass or tenderness.  EXTREMITIES: No cyanosis, clubbing, or edema, 2+ distal pulses.   Assessment:   Healthy female exam Fertility issues Vasomotor symptoms    Plan:   Pap done, will follow up results and manage accordingly. Infertility referral sent; labs sent to evaluate vasomotor symptoms Routine preventative health maintenance measures emphasized   Verita Schneiders, MD, FACOG Attending [place], [place]

## 2013-01-30 ENCOUNTER — Telehealth: Payer: Self-pay

## 2013-01-30 LAB — TESTOSTERONE, FREE, TOTAL, SHBG
Sex Hormone Binding: 69 nmol/L (ref 18–114)
Testosterone, Free: 6.4 pg/mL (ref 0.6–6.8)
Testosterone-% Free: 1.1 % (ref 0.4–2.4)
Testosterone: 58 ng/dL (ref 10–70)

## 2013-01-30 LAB — FOLLICLE STIMULATING HORMONE: FSH: 18.5 m[IU]/mL

## 2013-01-30 LAB — TSH: TSH: 0.826 u[IU]/mL (ref 0.350–4.500)

## 2013-01-30 NOTE — Telephone Encounter (Signed)
Called pt and left message that we have a referral to [place] for infertility for February 11th @ 11am @ [place] [facility] [address]  Please give [place] a call back to the clinics concerning your appt.  Sent office visit notes via Epic

## 2013-02-02 ENCOUNTER — Encounter: Payer: Self-pay | Admitting: Podiatry

## 2013-02-02 ENCOUNTER — Ambulatory Visit (INDEPENDENT_AMBULATORY_CARE_PROVIDER_SITE_OTHER): Payer: No Typology Code available for payment source | Admitting: Podiatry

## 2013-02-02 ENCOUNTER — Ambulatory Visit: Payer: No Typology Code available for payment source

## 2013-02-02 VITALS — BP 114/76 | HR 84 | Resp 12

## 2013-02-02 DIAGNOSIS — M2040 Other hammer toe(s) (acquired), unspecified foot: Secondary | ICD-10-CM

## 2013-02-02 DIAGNOSIS — M79609 Pain in unspecified limb: Secondary | ICD-10-CM

## 2013-02-02 NOTE — Progress Notes (Signed)
   Subjective:    Patient ID: Melissa [name], female    DOB: Dec 04, 1975, 38 y.o.   MRN: [id number]  HPI '' B/L 4TH AND 5TH TOE ARE SORE FOR 2-3YEARS AND ALREADY HAVE THE SURGERY BUT THEY GETTING WORSE.''  This patient presents complaining of painful fourth and fifth toes when walking wearing shoes. She says the pain is quite intense at time and limits the amount of activity that she can do. She has tried a variety of protective padding including toe shields, as well as toe separators, however, there was no significant reduction of pain. There is a history of hammertoe repair fourth digits bilaterally resecting the head of the proximal and distal phalanx of the fourth toes bilaterally on 10/09/2001. There is a history of mallet toe repair on the second third toes bilaterally on on 02/22/2005  She also relates a vague history of hand pain that was evaluated reason for orthopedic without a specific diagnosis.  Patient is currently unemployed  Review of Systems  Cardiovascular: Positive for leg swelling.  Musculoskeletal: Positive for back pain, gait problem, joint swelling and myalgias.  All other systems reviewed and are negative.       Objective:   Physical Exam  Orientated x94  38 year old female  Vascular: The DP and PT pulses are 2/4 bilaterally  Neurological: Knee and ankle foot reflexes are equal and reactive bilaterally  Dermatological: Small reactive fibrous tissue noted on the dorsal interphalangeal joints of the fourth toes bilaterally  Musculoskeletal: Adductovarus fourth and fifth toes noted bilaterally. Feet surgical scars noted over second, third, fourth toes bilaterally.  X-ray report right and left feet  Intact bony structure without a fracture and/or dislocations noted. Evidence of surgical bone removal noted in the distal interphalangeal joints of the second , third toes and fourth toes. The second and third toes are rectus in appearance. The fourth toe  demonstrates resection of bone at the level of the proximal interphalangeal joint as well as the distal interphalangeal joint, and the toe is in a varus position. The fifth toe demonstrates no bone removal, however, the toe is in a varus position. All the surgically resected bony areas on the toes demonstrate no irregular bony activity.  Radiographic impression: Evidence of previous bone surgical intervention second, third, fourth toe right foot Varus rotation of fourth and fifth toe right foot        Assessment & Plan:   Assessment: Recurrence of hammertoe deformities/varus position fourth toes bilaterally Hammertoe deformity fifth digits bilaterally  Plan: At this time I dispensed a gel sleeves to cover the fourth toes bilaterally. I made patient aware that the deformity and fourth toes has resulted in some recurrence of deformity and some flexibility in the toes. Also, I made patient aware that revision of surgery oftentimes was unsuccessful.  At this time patient states that she would like to know if there is any other alternative treatments including surgery. I advised patient that I am no longer performing surgical treatment. I am referring patient to Dr. Blenda Mounts for further evaluation and treatment as he deems necessary.

## 2013-02-03 ENCOUNTER — Telehealth: Payer: Self-pay

## 2013-02-03 ENCOUNTER — Telehealth: Payer: Self-pay | Admitting: *Deleted

## 2013-02-03 NOTE — Telephone Encounter (Signed)
error 

## 2013-02-03 NOTE — Telephone Encounter (Signed)
Patient called telling me that another physician that she is consulting with regarding her hx of ectopic pregnancy told her to day that she was treated in 2008 for GC by Dr. Moshe Salisbury. Patient called me asking me about what date this was.   I told her that I could not see those labs that far back. There was a few cytology and path results that went that far back but no other labs.  She said the doctor she saw today was in the [facility] system and she could see it.  I told her again I was sorry but I could not see it. I told her maybe she could check back with her doctor who saw it for the date but i did not have access to it.

## 2013-02-03 NOTE — Telephone Encounter (Signed)
Pt called nurse line requesting call back.  Spoke with patient and pt is inquiring about Gonorrhea in 2008.  I am unable to see the information in her chart.  Pt desires to know how this is in her chart.  I spoke with Dr. Glennie Hawk and she showed me in the history tab the history of Gonorrhea.  Spoke with pt and informed of the information in her history, we are not sure who placed this in her record. Pt verbalizes understanding.

## 2013-02-04 NOTE — Telephone Encounter (Signed)
Called pt to re inform her of the appt that she has scheduled with Kerin Perna on February 11th @ 11am.  If she has any questions to please give [place] a call.

## 2013-02-10 ENCOUNTER — Telehealth: Payer: Self-pay | Admitting: *Deleted

## 2013-02-10 NOTE — Telephone Encounter (Signed)
Pt called nurse line requesting results.

## 2013-02-11 ENCOUNTER — Ambulatory Visit: Payer: Self-pay | Admitting: Internal Medicine

## 2013-02-12 NOTE — Telephone Encounter (Signed)
[facility] and gave her results of pap smear and cotesting - all negative. Also gave her results of hormone labs- which appear normal- advised of if there were any abnormal results or she needed other treatment/ tests Dr. Harolyn Rutherford would have [place] to call her back.  Varvara also wants Dr. Harolyn Rutherford to know that she is still having bad night sweats. Informed [place] I would forward this message to Dr. Harolyn Rutherford and we would call her if there was more information from Dr. Harolyn Rutherford. Ash[place] voices understanding.

## 2013-02-25 ENCOUNTER — Ambulatory Visit: Payer: No Typology Code available for payment source

## 2013-02-25 ENCOUNTER — Ambulatory Visit (INDEPENDENT_AMBULATORY_CARE_PROVIDER_SITE_OTHER): Payer: No Typology Code available for payment source

## 2013-02-25 VITALS — BP 117/71 | HR 77 | Resp 18

## 2013-02-25 DIAGNOSIS — M79609 Pain in unspecified limb: Secondary | ICD-10-CM

## 2013-02-25 DIAGNOSIS — M2040 Other hammer toe(s) (acquired), unspecified foot: Secondary | ICD-10-CM

## 2013-02-25 NOTE — Patient Instructions (Signed)
Pre-Operative Instructions  Congratulations, you have decided to take an important step to improving your quality of life.  You can be assured that the doctors of [facility] will be with you every step of the way.  1. Plan to be at the surgery center/hospital at least 1 (one) hour prior to your scheduled time unless otherwise directed by the surgical center/hospital staff.  You must have a responsible adult accompany you, remain during the surgery and drive you home.  Make sure you have directions to the surgical center/hospital and know how to get there on time. 2. For hospital based surgery you will need to obtain a history and physical form from your family physician within 1 month prior to the date of surgery- we will give you a form for you primary physician.  3. We make every effort to accommodate the date you request for surgery.  There are however, times where surgery dates or times have to be moved.  We will contact you as soon as possible if a change in schedule is required.   4. No Aspirin/Ibuprofen for one week before surgery.  If you are on aspirin, any non-steroidal anti-inflammatory medications (Mobic, Aleve, Ibuprofen) you should stop taking it 7 days prior to your surgery.  You make take Tylenol  For pain prior to surgery.  5. Medications- If you are taking daily heart and blood pressure medications, seizure, reflux, allergy, asthma, anxiety, pain or diabetes medications, make sure the surgery center/hospital is aware before the day of surgery so they may notify you which medications to take or avoid the day of surgery. 6. No food or drink after midnight the night before surgery unless directed otherwise by surgical center/hospital staff. 7. No alcoholic beverages 24 hours prior to surgery.  No smoking 24 hours prior to or 24 hours after surgery. 8. Wear loose pants or shorts- loose enough to fit over bandages, boots, and casts. 9. No slip on shoes, sneakers are best. 10. Bring  your boot with you to the surgery center/hospital.  Also bring crutches or a walker if your physician has prescribed it for you.  If you do not have this equipment, it will be provided for you after surgery. 11. If you have not been contracted by the surgery center/hospital by the day before your surgery, call to confirm the date and time of your surgery. 12. Leave-time from work may vary depending on the type of surgery you have.  Appropriate arrangements should be made prior to surgery with your employer. 13. Prescriptions will be provided immediately following surgery by your doctor.  Have these filled as soon as possible after surgery and take the medication as directed. 14. Remove nail polish on the operative foot. 15. Wash the night before surgery.  The night before surgery wash the foot and leg well with the antibacterial soap provided and water paying special attention to beneath the toenails and in between the toes.  Rinse thoroughly with water and dry well with a towel.  Perform this wash unless told not to do so by your physician.  Enclosed: 1 Ice pack (please put in freezer the night before surgery)   1 Hibiclens skin cleaner   Pre-op Instructions  If you have any questions regarding the instructions, do not hesitate to call our office.  [place]: [address] [place], [place] [address] [phone]  [place]: [address] [place], [place] [address] [phone]  [place]: [address]  [place], [place] [address] [phone]  Dr. Nazarene Bunning   Tuchman DPM, Dr. Norman Regal DPM Dr. Yair Dusza DPM, Dr. M. Todd Hyatt DPM, Dr. Kathryn Egerton DPM 

## 2013-02-25 NOTE — Progress Notes (Signed)
   Subjective:    Patient ID: Melissa [name], female    DOB: Apr 12, 1975, 38 y.o.   MRN: [id number]  HPI My toes still hurt and hurts with shoes and I saw Dr. Amalia Hailey last week and they are curling    Review of Systems  Constitutional: Negative.   HENT: Negative.   Eyes: Negative.   Respiratory: Negative.   Cardiovascular: Negative.   Gastrointestinal: Negative.   Endocrine: Negative.   Genitourinary: Negative.   Musculoskeletal: Negative.   Skin: Negative.   Allergic/Immunologic: Negative.   Neurological: Negative.   Hematological: Negative.   Psychiatric/Behavioral: Negative.        Objective:   Physical Exam  Constitutional: She is oriented to person, place, and time. She appears well-developed and well-nourished.  Cardiovascular:  Pulses:      Dorsalis pedis pulses are 2+ on the right side, and 2+ on the left side.       Posterior tibial pulses are 2+ on the right side, and 2+ on the left side.  Pedal pulses are palpable bilateral Or refill time 3 seconds all digits skin temperature warm turgor normal no edema rubor pallor or varicosities noted.  Musculoskeletal:  Orthopedic biomechanical exam he lower exam reveals hammertoe deformities adductovarus rotation is fourth and fifth digits patient had previous surgery and second third and fourth toes the fourth toes bilateral show recurrence of deformity with lateral stability the proximal IP joint. There may be some additional deformity of distal IP joint left more so than right. There is also adductovarus rotation fifth digits with associated keratoses bilateral again residual hammertoe deformities with continued pain in symptomology by patient the hallux bilateral is rectus. Sesamoid position 1 no other osseous abnormalities are noted no other structural deformities noted.  Neurological: She is alert and oriented to person, place, and time. She has normal strength and normal reflexes.  Intact epicritic and proprioceptive  sensations noted bilateral. Normal plantar response and DTRs noted.  Skin: Skin is warm and dry. No cyanosis. Nails show no clubbing.  Skin color pigment and hair growth are normal there is absent hair growth the toes and forefoot bilateral nails normal trophic patient had healed incision scar second third and fourth toes bilateral there is diffuse keratoses HD 5 bilateral as well as keratoses the fourth digits right more than left. No open wounds or ulcerations are noted.  Psychiatric: She has a normal mood and affect. Her behavior is normal.          Assessment & Plan:  Assessment this time is recurrence of hammertoe deformity fourth toes bilateral and residual adductovarus hammertoe deformity fifth digits bilateral. At this time per patient request of Dr. Lovette Cliche is my recommendation surgery for hammertoe repair is an option chronic arrangements for the surgery done at the most one day surgery center likely involve screw fixations of the fourth digits bilateral as well as derotational arthroplasty fifth digits bilateral with Flagyl head resections being involved. Brisk L. turner the surgery are discussed with the patient also stressed her the importance of wearing a straight and boxy toe shoe rather than anything tapered or tight or constrictive both before and after surgery correction. Patient wearing curved last shoes which are appropriate for her at current time. Patient will obtain appropriate history and physical from primary care through: At her convenience with him next week or 2 were within a week or less of her surgery. Surgery scheduled her convenience with appropriate followup thereafter.  Harriet Masson DPM

## 2013-03-04 ENCOUNTER — Telehealth: Payer: Self-pay | Admitting: *Deleted

## 2013-03-04 NOTE — Telephone Encounter (Signed)
Pt states she has an ingrown toenail and forgot to ask Dr Blenda Mounts to look at it.  I referred to the schedulers for an appt.

## 2013-03-04 NOTE — Telephone Encounter (Signed)
SCHEDULED PT TO BE SEEN 03/13/13 FOR INGROWN NAIL

## 2013-03-10 ENCOUNTER — Telehealth: Payer: Self-pay | Admitting: *Deleted

## 2013-03-10 DIAGNOSIS — B379 Candidiasis, unspecified: Secondary | ICD-10-CM

## 2013-03-10 MED ORDER — FLUCONAZOLE 150 MG PO TABS: 150.0000 mg | ORAL_TABLET | Freq: Once | ORAL | Status: DC

## 2013-03-10 NOTE — Telephone Encounter (Signed)
Pt called nurse line requesting prescription refill.

## 2013-03-10 NOTE — Telephone Encounter (Signed)
Spoke with patient, patient states she has an yeast infection.  Diflucan ordered, pt verbalizes understanding.

## 2013-03-11 ENCOUNTER — Ambulatory Visit: Payer: No Typology Code available for payment source

## 2013-03-11 ENCOUNTER — Ambulatory Visit: Payer: No Typology Code available for payment source | Attending: Internal Medicine | Admitting: Internal Medicine

## 2013-03-11 VITALS — BP 117/78 | HR 62 | Temp 98.6°F | Resp 16 | Ht 61.0 in | Wt 157.0 lb

## 2013-03-11 DIAGNOSIS — B379 Candidiasis, unspecified: Secondary | ICD-10-CM

## 2013-03-11 DIAGNOSIS — R61 Generalized hyperhidrosis: Secondary | ICD-10-CM | POA: Insufficient documentation

## 2013-03-11 MED ORDER — METRONIDAZOLE 500 MG PO TABS: 500.0000 mg | ORAL_TABLET | Freq: Three times a day (TID) | ORAL | Status: DC

## 2013-03-11 MED ORDER — KETOCONAZOLE 200 MG PO TABS: 200.0000 mg | ORAL_TABLET | Freq: Every day | ORAL | Status: DC

## 2013-03-11 MED ORDER — FLUCONAZOLE 150 MG PO TABS: 150.0000 mg | ORAL_TABLET | Freq: Every day | ORAL | Status: DC | PRN

## 2013-03-11 NOTE — Progress Notes (Signed)
Patient ID: Melissa [name], female   DOB: 1975-11-04, 38 y.o.   MRN: [id number]   CC: night sweats    HPI: Pt is 38 yo female who presents to clinic with main concern of persistent night sweats. She was told her blood work is unremarkable and she is not in menopause. Pt says this is bothering her a lot. She denies fevers, chills, no abdominal or urinary concerns.   No Known Allergies Past Medical History  Diagnosis Date  . Gonorrhea 2008  . S/P ectopic pregnancy 2013    Treated with two doses of methotrexate  . Complete miscarriage   . Elective abortion   . H/O toe surgery    Current Outpatient Prescriptions on File Prior to Visit  Medication Sig Dispense Refill  . cholecalciferol (VITAMIN D) 1000 UNITS tablet Take 2,000 Units by mouth daily.      Marland Kitchen ibuprofen (ADVIL,MOTRIN) 800 MG tablet Take 1 tablet (800 mg total) by mouth 3 (three) times daily.  21 tablet  0  . Multiple Vitamins-Minerals (MULTIVITAMIN WITH MINERALS) tablet Take 1 tablet by mouth daily.       No current facility-administered medications on file prior to visit.   Family History  Problem Relation Age of Onset  . Cancer Father   . Birth defects Paternal Grandfather    History   Social History  . Marital Status: Single    Spouse Name: N/A    Number of Children: N/A  . Years of Education: N/A   Occupational History  . Not on file.   Social History Main Topics  . Smoking status: Former Smoker -- 0.50 packs/day for 13 years    Types: Cigarettes    Quit date: 01/09/2011  . Smokeless tobacco: Never Used  . Alcohol Use: Yes     Comment: OCCASIONALLY  . Drug Use: No  . Sexual Activity: Yes    Birth Control/ Protection: None   Other Topics Concern  . Not on file   Social History Narrative  . No narrative on file    Review of Systems  Constitutional: Negative for fever, chills, diaphoresis, activity change, appetite change and fatigue.  HENT: Negative for ear pain, nosebleeds, congestion, facial  swelling, rhinorrhea, neck pain, neck stiffness and ear discharge.   Eyes: Negative for pain, discharge, redness, itching and visual disturbance.  Respiratory: Negative for cough, choking, chest tightness, shortness of breath, wheezing and stridor.   Cardiovascular: Negative for chest pain, palpitations and leg swelling.  Gastrointestinal: Negative for abdominal distention.  Genitourinary: Negative for dysuria, urgency, frequency, hematuria, flank pain, decreased urine volume, difficulty urinating and dyspareunia.  Musculoskeletal: Negative for back pain, joint swelling, arthralgias and gait problem.  Neurological: Negative for dizziness, tremors, seizures, syncope, facial asymmetry, speech difficulty, weakness, light-headedness, numbness and headaches.  Hematological: Negative for adenopathy. Does not bruise/bleed easily.  Psychiatric/Behavioral: Negative for hallucinations, behavioral problems, confusion, dysphoric mood, decreased concentration and agitation.    Objective:   Filed Vitals:   03/11/13 1103  BP: 117/78  Pulse: 62  Temp: 98.6 F (37 C)  Resp: 16    Physical Exam  Constitutional: Appears well-developed and well-nourished. No distress.  HENT: Normocephalic. External right and left ear normal. Oropharynx is clear and moist.  Eyes: Conjunctivae and EOM are normal. PERRLA, no scleral icterus.  Neck: Normal ROM. Neck supple. No JVD. No tracheal deviation. No thyromegaly.  CVS: RRR, S1/S2 +, no murmurs, no gallops, no carotid bruit.  Pulmonary: Effort and breath sounds normal, no stridor, rhonchi,  wheezes, rales.  Abdominal: Soft. BS +,  no distension, tenderness, rebound or guarding.  Musculoskeletal: Normal range of motion. No edema and no tenderness.  Lymphadenopathy: No lymphadenopathy noted, cervical, inguinal. Neuro: Alert. Normal reflexes, muscle tone coordination. No cranial nerve deficit. Skin: Skin is warm and dry. No rash noted. Not diaphoretic. No erythema. No  pallor.  Psychiatric: Normal mood and affect. Behavior, judgment, thought content normal.   Lab Results  Component Value Date   WBC 8.4 12/31/2012   HGB 15.0 12/31/2012   HCT 42.9 12/31/2012   MCV 91.9 12/31/2012   PLT 346 12/31/2012   Lab Results  Component Value Date   CREATININE 0.64 12/31/2012   BUN 10 12/31/2012   NA 139 12/31/2012   K 4.1 12/31/2012   CL 102 12/31/2012   CO2 25 12/31/2012    Lab Results  Component Value Date   HGBA1C 5.5 05/02/2012   Lipid Panel     Component Value Date/Time   CHOL 181 12/31/2012 1633   TRIG 111 12/31/2012 1633   HDL 42 12/31/2012 1633   CHOLHDL 4.3 12/31/2012 1633   VLDL 22 12/31/2012 1633   LDLCALC 117* 12/31/2012 1633       Assessment and plan:   Patient Active Problem List   Diagnosis Date Noted  . Night sweats - discussed medical options, herbal supplementation recommended. Will consider HRT but pt wants to think about it

## 2013-03-11 NOTE — Progress Notes (Signed)
Pt is here concerned about her frequent night sweats. Pt is needing medications for a yeast infection.

## 2013-03-13 ENCOUNTER — Ambulatory Visit (INDEPENDENT_AMBULATORY_CARE_PROVIDER_SITE_OTHER): Payer: No Typology Code available for payment source

## 2013-03-13 VITALS — BP 109/53 | HR 85 | Resp 16

## 2013-03-13 DIAGNOSIS — L600 Ingrowing nail: Secondary | ICD-10-CM

## 2013-03-13 NOTE — Progress Notes (Signed)
   Subjective:    Patient ID: NAMITA YEARWOOD, female    DOB: August 23, 1975, 38 y.o.   MRN: [id number]  HPI Comments: "I just want to ask him about this toe(1st toe left), maybe doing the ingrown with the surgery. And also, I want to change my surgery the 20th from the 6th"     Review of Systems no new changes or findings     Objective:   Physical Exam Patient presents this time for followup and some additional questions patient does have some small regrowth or spicule of the lateral nail fold the left hallux was and bothering her osteophectomy also done at [facility]. time is her hammertoe surgeries redone. Patient is RE scheduled for hammertoe surgery third and correction fourth and fifth toes bilateral feet. With screw fixation of the fourth toe and derotational arthroplasty fifth toes these consent forms are reviewed in the addition of redo phenol matricectomy of the left hallux lateral border for a spicule or nail regrowth. There is no other changes in history no other changes in neurovascular findings or dermatologic findings pulses are palpable epicritic and proprioceptive sensations intact digital contractures still noted at this time again discussed the surgery risks and alternatives in surgery rescheduled for later next month.       Assessment & Plan:  Assessment this time hammertoe deformity fourth and fifth bilateral as well as ingrowing nail with regrowth of nail spicule lateral border left hallux consent form is attended at this time to include the nail phenol matricectomy lateral border left hallux as well as hammertoe repairs fourth and fifth toes bilateral. Patient is also issued another medical history H&P form for her primary physician to complete as required for [facility] hospital surgeries. Next Harriet Masson DPM

## 2013-03-13 NOTE — Patient Instructions (Signed)
Pre-Operative Instructions  Congratulations, you have decided to take an important step to improving your quality of life.  You can be assured that the doctors of [facility] will be with you every step of the way.  1. Plan to be at the surgery center/hospital at least 1 (one) hour prior to your scheduled time unless otherwise directed by the surgical center/hospital staff.  You must have a responsible adult accompany you, remain during the surgery and drive you home.  Make sure you have directions to the surgical center/hospital and know how to get there on time. 2. For hospital based surgery you will need to obtain a history and physical form from your family physician within 1 month prior to the date of surgery- we will give you a form for you primary physician.  3. We make every effort to accommodate the date you request for surgery.  There are however, times where surgery dates or times have to be moved.  We will contact you as soon as possible if a change in schedule is required.   4. No Aspirin/Ibuprofen for one week before surgery.  If you are on aspirin, any non-steroidal anti-inflammatory medications (Mobic, Aleve, Ibuprofen) you should stop taking it 7 days prior to your surgery.  You make take Tylenol  For pain prior to surgery.  5. Medications- If you are taking daily heart and blood pressure medications, seizure, reflux, allergy, asthma, anxiety, pain or diabetes medications, make sure the surgery center/hospital is aware before the day of surgery so they may notify you which medications to take or avoid the day of surgery. 6. No food or drink after midnight the night before surgery unless directed otherwise by surgical center/hospital staff. 7. No alcoholic beverages 24 hours prior to surgery.  No smoking 24 hours prior to or 24 hours after surgery. 8. Wear loose pants or shorts- loose enough to fit over bandages, boots, and casts. 9. No slip on shoes, sneakers are best. 10. Bring  your boot with you to the surgery center/hospital.  Also bring crutches or a walker if your physician has prescribed it for you.  If you do not have this equipment, it will be provided for you after surgery. 11. If you have not been contracted by the surgery center/hospital by the day before your surgery, call to confirm the date and time of your surgery. 12. Leave-time from work may vary depending on the type of surgery you have.  Appropriate arrangements should be made prior to surgery with your employer. 13. Prescriptions will be provided immediately following surgery by your doctor.  Have these filled as soon as possible after surgery and take the medication as directed. 14. Remove nail polish on the operative foot. 15. Wash the night before surgery.  The night before surgery wash the foot and leg well with the antibacterial soap provided and water paying special attention to beneath the toenails and in between the toes.  Rinse thoroughly with water and dry well with a towel.  Perform this wash unless told not to do so by your physician.  Enclosed: 1 Ice pack (please put in freezer the night before surgery)   1 Hibiclens skin cleaner   Pre-op Instructions  If you have any questions regarding the instructions, do not hesitate to call our office.  [place]: [address] , [place] [address] [phone]  [place]: [address] [place], [place] [address] [phone]  [place]: [address]  [place], [place] [address] [phone]  Dr. Yishai Rehfeld   Tuchman DPM, Dr. Norman Regal DPM Dr. Raheen Capili DPM, Dr. M. Todd Hyatt DPM, Dr. Kathryn Egerton DPM 

## 2013-04-01 DIAGNOSIS — L600 Ingrowing nail: Secondary | ICD-10-CM

## 2013-04-01 DIAGNOSIS — M2040 Other hammer toe(s) (acquired), unspecified foot: Secondary | ICD-10-CM

## 2013-04-01 HISTORY — DX: Ingrowing nail: L60.0

## 2013-04-01 HISTORY — DX: Other hammer toe(s) (acquired), unspecified foot: M20.40

## 2013-04-13 ENCOUNTER — Encounter (HOSPITAL_BASED_OUTPATIENT_CLINIC_OR_DEPARTMENT_OTHER): Payer: Self-pay | Admitting: *Deleted

## 2013-04-14 ENCOUNTER — Ambulatory Visit (INDEPENDENT_AMBULATORY_CARE_PROVIDER_SITE_OTHER): Payer: No Typology Code available for payment source

## 2013-04-14 ENCOUNTER — Telehealth: Payer: Self-pay | Admitting: General Practice

## 2013-04-14 ENCOUNTER — Encounter (HOSPITAL_BASED_OUTPATIENT_CLINIC_OR_DEPARTMENT_OTHER): Payer: Self-pay | Admitting: *Deleted

## 2013-04-14 VITALS — BP 126/81 | HR 81 | Resp 16

## 2013-04-14 DIAGNOSIS — L600 Ingrowing nail: Secondary | ICD-10-CM

## 2013-04-14 DIAGNOSIS — M79609 Pain in unspecified limb: Secondary | ICD-10-CM

## 2013-04-14 DIAGNOSIS — M2040 Other hammer toe(s) (acquired), unspecified foot: Secondary | ICD-10-CM

## 2013-04-14 NOTE — Patient Instructions (Signed)
Pre-Operative Instructions  Congratulations, you have decided to take an important step to improving your quality of life.  You can be assured that the doctors of [facility] will be with you every step of the way.  1. Plan to be at the surgery center/hospital at least 1 (one) hour prior to your scheduled time unless otherwise directed by the surgical center/hospital staff.  You must have a responsible adult accompany you, remain during the surgery and drive you home.  Make sure you have directions to the surgical center/hospital and know how to get there on time. 2. For hospital based surgery you will need to obtain a history and physical form from your family physician within 1 month prior to the date of surgery- we will give you a form for you primary physician.  3. We make every effort to accommodate the date you request for surgery.  There are however, times where surgery dates or times have to be moved.  We will contact you as soon as possible if a change in schedule is required.   4. No Aspirin/Ibuprofen for one week before surgery.  If you are on aspirin, any non-steroidal anti-inflammatory medications (Mobic, Aleve, Ibuprofen) you should stop taking it 7 days prior to your surgery.  You make take Tylenol  For pain prior to surgery.  5. Medications- If you are taking daily heart and blood pressure medications, seizure, reflux, allergy, asthma, anxiety, pain or diabetes medications, make sure the surgery center/hospital is aware before the day of surgery so they may notify you which medications to take or avoid the day of surgery. 6. No food or drink after midnight the night before surgery unless directed otherwise by surgical center/hospital staff. 7. No alcoholic beverages 24 hours prior to surgery.  No smoking 24 hours prior to or 24 hours after surgery. 8. Wear loose pants or shorts- loose enough to fit over bandages, boots, and casts. 9. No slip on shoes, sneakers are best. 10. Bring  your boot with you to the surgery center/hospital.  Also bring crutches or a walker if your physician has prescribed it for you.  If you do not have this equipment, it will be provided for you after surgery. 11. If you have not been contracted by the surgery center/hospital by the day before your surgery, call to confirm the date and time of your surgery. 12. Leave-time from work may vary depending on the type of surgery you have.  Appropriate arrangements should be made prior to surgery with your employer. 13. Prescriptions will be provided immediately following surgery by your doctor.  Have these filled as soon as possible after surgery and take the medication as directed. 14. Remove nail polish on the operative foot. 15. Wash the night before surgery.  The night before surgery wash the foot and leg well with the antibacterial soap provided and water paying special attention to beneath the toenails and in between the toes.  Rinse thoroughly with water and dry well with a towel.  Perform this wash unless told not to do so by your physician.  Enclosed: 1 Ice pack (please put in freezer the night before surgery)   1 Hibiclens skin cleaner   Pre-op Instructions  If you have any questions regarding the instructions, do not hesitate to call our office.  [place]: [address] , [place] [address] [phone]  [place]: [address] [place], [place] [address] [phone]  [place]: [address]  [place], [place] [address] [phone]  Dr. Areana Kosanke   Tuchman DPM, Dr. Norman Regal DPM Dr. Yerania Chamorro DPM, Dr. M. Todd Hyatt DPM, Dr. Kathryn Egerton DPM 

## 2013-04-14 NOTE — Progress Notes (Signed)
   Subjective:    Patient ID: Melissa [name], female    DOB: 1975-07-23, 38 y.o.   MRN: [id number]  HPI Comments: "I just want him to check my feet and see if there is more that needs to be done before surgery"  Follow up prior to bunion surgery bilateral - patient is scheduled for Monday, April 20th.     Review of Systems no systemic changes or findings      Objective:   Physical Exam Patient presents this time have referred done consent form for hammertoe repair fourth and fifth toes bilateral screw fixation fourth toes and a redo of lateral nail fold spicule left hallux. At this time patient presents tissue starting have some issues with her bunions however after discussing this this was told to bunion correction she will be in once identified Cecille Rubin schedule this for bilateral surgery and I would have to change the entire thing. Patient is advised that conditions at this time that she stay with the original surgery RE scheduled and consented for in the future should benefit from or why a coming shoes her current shoes are too narrow for foot at least a half inch to three quarters of an inch. Patient may have some mild bunion deformity although with appropriate coming shoes that should resolve or improve.       Assessment & Plan:  Reviewed the procedures in schedule she is scheduled for her physical this afternoon with primary physician. Keep appointment next Monday for surgery at is his: Day surgery again recommended hammertoe repair fourth and fifth bilateral and nail reexcision left hallux lateral border. There no contraindications to surgery at this time. There no contraindications to surgery again neurovascular status is intact with appropriate postop followup pack to the office when surgeries completed next Monday.  Harriet Masson DPM

## 2013-04-14 NOTE — Telephone Encounter (Signed)
Called patient to see if she went to her infertility appt on 2/11, no answer- left message that we are trying to get in touch with you about an appt please call us back at the clinics. Please update ultrasound sheet after reaching patient

## 2013-04-16 ENCOUNTER — Other Ambulatory Visit: Payer: Self-pay

## 2013-04-17 ENCOUNTER — Telehealth: Payer: Self-pay | Admitting: *Deleted

## 2013-04-17 ENCOUNTER — Encounter: Payer: Self-pay | Admitting: Internal Medicine

## 2013-04-17 ENCOUNTER — Other Ambulatory Visit: Payer: Self-pay

## 2013-04-17 ENCOUNTER — Ambulatory Visit: Payer: No Typology Code available for payment source | Attending: Internal Medicine | Admitting: Internal Medicine

## 2013-04-17 VITALS — BP 125/87 | HR 102 | Temp 98.7°F | Resp 22 | Ht 61.0 in | Wt 158.6 lb

## 2013-04-17 DIAGNOSIS — IMO0002 Reserved for concepts with insufficient information to code with codable children: Secondary | ICD-10-CM

## 2013-04-17 DIAGNOSIS — Z87891 Personal history of nicotine dependence: Secondary | ICD-10-CM | POA: Insufficient documentation

## 2013-04-17 DIAGNOSIS — Z01818 Encounter for other preprocedural examination: Secondary | ICD-10-CM | POA: Insufficient documentation

## 2013-04-17 DIAGNOSIS — M2040 Other hammer toe(s) (acquired), unspecified foot: Secondary | ICD-10-CM

## 2013-04-17 DIAGNOSIS — L219 Seborrheic dermatitis, unspecified: Secondary | ICD-10-CM | POA: Insufficient documentation

## 2013-04-17 DIAGNOSIS — N979 Female infertility, unspecified: Secondary | ICD-10-CM

## 2013-04-17 DIAGNOSIS — N951 Menopausal and female climacteric states: Secondary | ICD-10-CM

## 2013-04-17 DIAGNOSIS — R232 Flushing: Secondary | ICD-10-CM

## 2013-04-17 MED ORDER — VITAMIN E 1000 UNITS PO CAPS: 1000.0000 [IU] | ORAL_CAPSULE | Freq: Every day | ORAL | Status: DC

## 2013-04-17 MED ORDER — CICLOPIROX 1 % EX SHAM: 1.0000 "application " | MEDICATED_SHAMPOO | Freq: Two times a day (BID) | CUTANEOUS | Status: DC

## 2013-04-17 NOTE — Telephone Encounter (Signed)
I called and asked the patient if she had her physical done that was needed prior to surgery.  She stated she had it done today.  She saw Dr. Reyne Dumas.  She said that they faxed it over to [facility].  I checked the computer, EPIC, there was a note by Dr. Allyson Sabal.  I called and informed Juliann Pulse from [facility].  She said she would print the note out.  I called and informed the patient that we have what we needed.

## 2013-04-17 NOTE — Addendum Note (Signed)
Addended by: Allyson Sabal MD, Ascencion Dike on: 04/17/2013 09:56 AM   Modules accepted: Orders

## 2013-04-17 NOTE — Progress Notes (Signed)
Patient ID: Melissa [name], female   DOB: 1975-04-06, 38 y.o.   MRN: [id number]   CC:  HPI: 38 year old female here for a preoperative clearance. Patient is scheduled to have hammertoe repair of the fourth and fifth toes bilaterally. Patient does not have any known medical conditions that would prevent her from having surgery. The patient is fairly healthy and does not have any cardiac or pulmonary condition. Is a nonsmoker and drinks socially. She was last seen here by Dr. Doyle Askew, for hot flashes and night sweats. Patient is not sure if she has  premature ovarian failure. Her menstrual cycles are regular. Her thyroid function back in December was within normal limits. Vitamin D level was mildly low but the patient is on vitamin D supplementation.   She has a history of a seborrheic dermatitis and has tried ketoconazole shampoo, and multiple other medications that she cannot remember the names. She does not report any other medical history    No Known Allergies Past Medical History  Diagnosis Date  . Hammer toe 04/2013    bilateral 4th and 5th   . Ingrown left big toenail 04/2013   Current Outpatient Prescriptions on File Prior to Visit  Medication Sig Dispense Refill  . cholecalciferol (VITAMIN D) 1000 UNITS tablet Take 2,000 Units by mouth daily.      . Multiple Vitamins-Minerals (MULTIVITAMIN WITH MINERALS) tablet Take 1 tablet by mouth daily.      . vitamin C (ASCORBIC ACID) 500 MG tablet Take 500 mg by mouth daily.       No current facility-administered medications on file prior to visit.   Family History  Problem Relation Age of Onset  . Cancer Father   . Birth defects Paternal Grandfather    History   Social History  . Marital Status: Single    Spouse Name: N/A    Number of Children: N/A  . Years of Education: N/A   Occupational History  . Not on file.   Social History Main Topics  . Smoking status: Former Smoker -- 0.50 packs/day for 13 years    Quit date:  12/31/2009  . Smokeless tobacco: Never Used  . Alcohol Use: Yes     Comment: occasionally  . Drug Use: No  . Sexual Activity: Yes    Birth Control/ Protection: None   Other Topics Concern  . Not on file   Social History Narrative  . No narrative on file    Review of Systems  Constitutional: Negative for fever, chills, diaphoresis, activity change, appetite change and fatigue.  HENT: Negative for ear pain, nosebleeds, congestion, facial swelling, rhinorrhea, neck pain, neck stiffness and ear discharge.   Eyes: Negative for pain, discharge, redness, itching and visual disturbance.  Respiratory: Negative for cough, choking, chest tightness, shortness of breath, wheezing and stridor.   Cardiovascular: Negative for chest pain, palpitations and leg swelling.  Gastrointestinal: Negative for abdominal distention.  Genitourinary: Negative for dysuria, urgency, frequency, hematuria, flank pain, decreased urine volume, difficulty urinating and dyspareunia.  Musculoskeletal: Negative for back pain, joint swelling, arthralgias and gait problem.  Neurological: Negative for dizziness, tremors, seizures, syncope, facial asymmetry, speech difficulty, weakness, light-headedness, numbness and headaches.  Hematological: Negative for adenopathy. Does not bruise/bleed easily.  Psychiatric/Behavioral: Negative for hallucinations, behavioral problems, confusion, dysphoric mood, decreased concentration and agitation.    Objective:   Filed Vitals:   04/17/13 0924  BP: 125/87  Pulse: 102  Temp: 98.7 F (37.1 C)  Resp: 22    Physical Exam  Constitutional: Appears well-developed and well-nourished. No distress.  HENT: Normocephalic. External right and left ear normal. Oropharynx is clear and moist.  Eyes: Conjunctivae and EOM are normal. PERRLA, no scleral icterus.  Neck: Normal ROM. Neck supple. No JVD. No tracheal deviation. No thyromegaly.  CVS: RRR, S1/S2 +, no murmurs, no gallops, no carotid  bruit.  Pulmonary: Effort and breath sounds normal, no stridor, rhonchi, wheezes, rales.  Abdominal: Soft. BS +,  no distension, tenderness, rebound or guarding.  Musculoskeletal: Normal range of motion. No edema and no tenderness.  Lymphadenopathy: No lymphadenopathy noted, cervical, inguinal. Neuro: Alert. Normal reflexes, muscle tone coordination. No cranial nerve deficit. Skin: Skin is warm and dry. No rash noted. Not diaphoretic. No erythema. No pallor.  Psychiatric: Normal mood and affect. Behavior, judgment, thought content normal.   Lab Results  Component Value Date   WBC 8.4 12/31/2012   HGB 15.0 12/31/2012   HCT 42.9 12/31/2012   MCV 91.9 12/31/2012   PLT 346 12/31/2012   Lab Results  Component Value Date   CREATININE 0.64 12/31/2012   BUN 10 12/31/2012   NA 139 12/31/2012   K 4.1 12/31/2012   CL 102 12/31/2012   CO2 25 12/31/2012    Lab Results  Component Value Date   HGBA1C 5.5 05/02/2012   Lipid Panel     Component Value Date/Time   CHOL 181 12/31/2012 1633   TRIG 111 12/31/2012 1633   HDL 42 12/31/2012 1633   CHOLHDL 4.3 12/31/2012 1633   VLDL 22 12/31/2012 1633   LDLCALC 117* 12/31/2012 1633       Assessment and plan:   Patient Active Problem List   Diagnosis Date Noted  . History of recurrent spontaneous abortion, not currently pregnant 01/26/2012  . Fluid retention 01/25/2012  . Weight gain 01/25/2012  . Dysuria 12/07/2011  . Bacterial vulvovaginitis 07/20/2011  . Ectopic pregnancy 02/07/2011  . Gonorrhea    Preop clearance Did not see any contraindication to proceed with surgery as per schedule   Infertility Referring provider to see gynecology  Seborrheic dermatitis Patient was given information about selenium sulfide which is over-the-counter  Patient needs to follow up in 3-4 months for a full physical        The patient was given clear instructions to go to ER or return to medical center if symptoms don't improve, worsen or new  problems develop. The patient verbalized understanding. The patient was told to call to get any lab results if not heard anything in the next week.

## 2013-04-17 NOTE — Progress Notes (Signed)
Patient here for pre-operative clearance. She plans to have bilateral foot surgery for ingrown toenail on left great toe and hammer toes on both feet. Requests pre-operative paperwork to be completed by physician and faxed to surgery office. Dr. Allyson Sabal notified.

## 2013-04-20 ENCOUNTER — Encounter (HOSPITAL_BASED_OUTPATIENT_CLINIC_OR_DEPARTMENT_OTHER): Payer: Medicaid Other | Admitting: Anesthesiology

## 2013-04-20 ENCOUNTER — Ambulatory Visit (HOSPITAL_BASED_OUTPATIENT_CLINIC_OR_DEPARTMENT_OTHER): Payer: Medicaid Other | Admitting: Anesthesiology

## 2013-04-20 ENCOUNTER — Ambulatory Visit (HOSPITAL_BASED_OUTPATIENT_CLINIC_OR_DEPARTMENT_OTHER)
Admission: RE | Admit: 2013-04-20 | Discharge: 2013-04-20 | Disposition: A | Discharge status: Home / Self Care | Payer: Medicaid Other | Source: Ambulatory Visit

## 2013-04-20 ENCOUNTER — Encounter (HOSPITAL_BASED_OUTPATIENT_CLINIC_OR_DEPARTMENT_OTHER): Payer: Self-pay | Admitting: *Deleted

## 2013-04-20 ENCOUNTER — Encounter (HOSPITAL_BASED_OUTPATIENT_CLINIC_OR_DEPARTMENT_OTHER): Admission: RE | Disposition: A | Discharge status: Home / Self Care | Payer: Self-pay | Source: Ambulatory Visit

## 2013-04-20 DIAGNOSIS — Z87891 Personal history of nicotine dependence: Secondary | ICD-10-CM | POA: Insufficient documentation

## 2013-04-20 DIAGNOSIS — L600 Ingrowing nail: Secondary | ICD-10-CM | POA: Insufficient documentation

## 2013-04-20 DIAGNOSIS — M2040 Other hammer toe(s) (acquired), unspecified foot: Secondary | ICD-10-CM | POA: Insufficient documentation

## 2013-04-20 HISTORY — PX: HAMMER TOE SURGERY: SHX385

## 2013-04-20 HISTORY — DX: Other hammer toe(s) (acquired), unspecified foot: M20.40

## 2013-04-20 HISTORY — DX: Ingrowing nail: L60.0

## 2013-04-20 LAB — POCT HEMOGLOBIN-HEMACUE: Hemoglobin: 13.7 g/dL (ref 12.0–15.0)

## 2013-04-20 SURGERY — CORRECTION, HAMMER TOE
Anesthesia: Monitor Anesthesia Care | Site: Toe | Laterality: Bilateral

## 2013-04-20 MED ORDER — LACTATED RINGERS IV SOLN
INTRAVENOUS | Status: DC
  Administered 2013-04-20 (×3): via INTRAVENOUS

## 2013-04-20 MED ORDER — MIDAZOLAM HCL 5 MG/5ML IJ SOLN
INTRAMUSCULAR | Status: DC | PRN
  Administered 2013-04-20: 2 mg via INTRAVENOUS

## 2013-04-20 MED ORDER — HYDROCODONE-ACETAMINOPHEN 5-325 MG PO TABS
ORAL_TABLET | ORAL | Status: AC
  Filled 2013-04-20: qty 1

## 2013-04-20 MED ORDER — LIDOCAINE HCL 2 % IJ SOLN
INTRAMUSCULAR | Status: DC | PRN
  Administered 2013-04-20: 20 mL

## 2013-04-20 MED ORDER — BUPIVACAINE HCL (PF) 0.5 % IJ SOLN
INTRAMUSCULAR | Status: AC
  Filled 2013-04-20: qty 60

## 2013-04-20 MED ORDER — DEXAMETHASONE SODIUM PHOSPHATE 10 MG/ML IJ SOLN
INTRAMUSCULAR | Status: DC | PRN
  Administered 2013-04-20 (×2): .5 mL

## 2013-04-20 MED ORDER — POVIDONE-IODINE 10 % EX OINT
TOPICAL_OINTMENT | CUTANEOUS | Status: AC
  Filled 2013-04-20: qty 28.35

## 2013-04-20 MED ORDER — ONDANSETRON HCL 4 MG/2ML IJ SOLN: 4.0000 mg | Freq: Once | INTRAMUSCULAR | Status: DC | PRN

## 2013-04-20 MED ORDER — LIDOCAINE HCL (CARDIAC) 20 MG/ML IV SOLN
INTRAVENOUS | Status: DC | PRN
  Administered 2013-04-20: 50 mg via INTRAVENOUS

## 2013-04-20 MED ORDER — SODIUM CHLORIDE 0.9 % IR SOLN
Status: DC | PRN
  Administered 2013-04-20: 14:00:00

## 2013-04-20 MED ORDER — CEFAZOLIN SODIUM-DEXTROSE 2-3 GM-% IV SOLR
INTRAVENOUS | Status: AC
  Filled 2013-04-20: qty 50

## 2013-04-20 MED ORDER — CEPHALEXIN 500 MG PO CAPS: 500.0000 mg | ORAL_CAPSULE | Freq: Four times a day (QID) | ORAL | Status: DC

## 2013-04-20 MED ORDER — POVIDONE-IODINE 7.5 % EX SOLN: Freq: Once | CUTANEOUS | Status: DC

## 2013-04-20 MED ORDER — HYDROCODONE-ACETAMINOPHEN 5-325 MG PO TABS: 1.0000 | ORAL_TABLET | ORAL | Status: DC | PRN

## 2013-04-20 MED ORDER — BUPIVACAINE HCL (PF) 0.5 % IJ SOLN
INTRAMUSCULAR | Status: DC | PRN
  Administered 2013-04-20: 30 mL

## 2013-04-20 MED ORDER — ONDANSETRON HCL 4 MG/2ML IJ SOLN
INTRAMUSCULAR | Status: DC | PRN
  Administered 2013-04-20: 4 mg via INTRAVENOUS

## 2013-04-20 MED ORDER — PROPOFOL 10 MG/ML IV BOLUS
INTRAVENOUS | Status: DC | PRN
  Administered 2013-04-20: 10 mg via INTRAVENOUS
  Administered 2013-04-20: 50 mg via INTRAVENOUS
  Administered 2013-04-20: 10 mg via INTRAVENOUS

## 2013-04-20 MED ORDER — POVIDONE-IODINE 10 % EX OINT
TOPICAL_OINTMENT | CUTANEOUS | Status: DC | PRN
  Administered 2013-04-20: 1 via TOPICAL

## 2013-04-20 MED ORDER — CEFAZOLIN SODIUM-DEXTROSE 2-3 GM-% IV SOLR: 2.0000 g | INTRAVENOUS | Status: DC

## 2013-04-20 MED ORDER — LIDOCAINE HCL 2 % IJ SOLN
INTRAMUSCULAR | Status: AC
  Filled 2013-04-20: qty 40

## 2013-04-20 MED ORDER — HYDROCODONE-ACETAMINOPHEN 5-325 MG PO TABS
1.0000 | ORAL_TABLET | Freq: Once | ORAL | Status: AC
  Administered 2013-04-20: 1 via ORAL

## 2013-04-20 MED ORDER — MIDAZOLAM HCL 2 MG/2ML IJ SOLN
INTRAMUSCULAR | Status: AC
  Filled 2013-04-20: qty 2

## 2013-04-20 MED ORDER — CEFAZOLIN SODIUM-DEXTROSE 2-3 GM-% IV SOLR
2.0000 g | INTRAVENOUS | Status: AC
  Administered 2013-04-20: 2 g via INTRAVENOUS

## 2013-04-20 MED ORDER — MIDAZOLAM HCL 2 MG/ML PO SYRP: 12.0000 mg | ORAL_SOLUTION | Freq: Once | ORAL | Status: DC | PRN

## 2013-04-20 MED ORDER — FENTANYL CITRATE 0.05 MG/ML IJ SOLN
INTRAMUSCULAR | Status: AC
  Filled 2013-04-20: qty 6

## 2013-04-20 MED ORDER — FENTANYL CITRATE 0.05 MG/ML IJ SOLN
INTRAMUSCULAR | Status: DC | PRN
  Administered 2013-04-20: 100 ug via INTRAVENOUS

## 2013-04-20 MED ORDER — PROPOFOL INFUSION 10 MG/ML OPTIME
INTRAVENOUS | Status: DC | PRN
  Administered 2013-04-20: 100 ug/kg/min via INTRAVENOUS

## 2013-04-20 MED ORDER — FENTANYL CITRATE 0.05 MG/ML IJ SOLN: 50.0000 ug | INTRAMUSCULAR | Status: DC | PRN

## 2013-04-20 MED ORDER — MIDAZOLAM HCL 2 MG/2ML IJ SOLN: 1.0000 mg | INTRAMUSCULAR | Status: DC | PRN

## 2013-04-20 MED ORDER — HYDROMORPHONE HCL PF 1 MG/ML IJ SOLN: 0.2500 mg | INTRAMUSCULAR | Status: DC | PRN

## 2013-04-20 MED ORDER — PHENOL 89 % LIQD
Status: DC | PRN
  Administered 2013-04-20: 1 via TOPICAL

## 2013-04-20 SURGICAL SUPPLY — 61 items
APPLICATOR COTTON TIP 6IN STRL (MISCELLANEOUS) ×2 IMPLANT
BAG DECANTER FOR FLEXI CONT (MISCELLANEOUS) ×2 IMPLANT
BANDAGE ELASTIC 3 VELCRO ST LF (GAUZE/BANDAGES/DRESSINGS) ×3 IMPLANT
BIT DRILL 2XSM QC CANN SCR SYS (BIT) IMPLANT
BIT DRL 2XSM QC CANN SCR SYS (BIT) ×1
BLADE CCA MICRO SAG (BLADE) ×1 IMPLANT
BLADE SURG 15 STRL LF DISP TIS (BLADE) ×2 IMPLANT
BLADE SURG 15 STRL SS (BLADE) ×4
BNDG CMPR 9X4 STRL LF SNTH (GAUZE/BANDAGES/DRESSINGS) ×1
BNDG CONFORM 2 STRL LF (GAUZE/BANDAGES/DRESSINGS) ×3 IMPLANT
BNDG ESMARK 4X9 LF (GAUZE/BANDAGES/DRESSINGS) ×2 IMPLANT
CAP PIN PROTECTOR ORTHO WHT (CAP) IMPLANT
COVER TABLE BACK 60X90 (DRAPES) ×2 IMPLANT
CUFF TOURNIQUET SINGLE 18IN (TOURNIQUET CUFF) ×2 IMPLANT
DRAPE EXTREMITY T 121X128X90 (DRAPE) ×3 IMPLANT
DRAPE OEC MINIVIEW 54X84 (DRAPES) ×1 IMPLANT
DRILL BIT 2.0 (BIT) ×2
DRILL BIT WIRE PASS (BIT) IMPLANT
DRILL TWIST 1.8MM (BURR) IMPLANT
DURAPREP 26ML APPLICATOR (WOUND CARE) ×3 IMPLANT
ELECT REM PT RETURN 9FT ADLT (ELECTROSURGICAL)
ELECTRODE REM PT RTRN 9FT ADLT (ELECTROSURGICAL) IMPLANT
GAUZE SPONGE 4X4 16PLY XRAY LF (GAUZE/BANDAGES/DRESSINGS) IMPLANT
GAUZE XEROFORM 1X8 LF (GAUZE/BANDAGES/DRESSINGS) ×2 IMPLANT
GLOVE BIO SURGEON STRL SZ7.5 (GLOVE) IMPLANT
GLOVE SS BIOGEL STRL SZ 8 (GLOVE) ×1 IMPLANT
GLOVE SUPERSENSE BIOGEL SZ 8 (GLOVE) ×1
GLOVE SURG SS PI 7.0 STRL IVOR (GLOVE) ×1 IMPLANT
GOWN SIRUS LVL3 4XL (GOWN DISPOSABLE) ×2 IMPLANT
GOWN STRL REUS W/ TWL LRG LVL3 (GOWN DISPOSABLE) ×1 IMPLANT
GOWN STRL REUS W/TWL LRG LVL3 (GOWN DISPOSABLE) ×2
K-WIRE .045X4 (WIRE) IMPLANT
NDL HYPO 25X1 1.5 SAFETY (NEEDLE) ×2 IMPLANT
NDL SAFETY ECLIPSE 18X1.5 (NEEDLE) ×1 IMPLANT
NEEDLE HYPO 18GX1.5 SHARP (NEEDLE) ×2
NEEDLE HYPO 25X1 1.5 SAFETY (NEEDLE) ×4 IMPLANT
NS IRRIG 1000ML POUR BTL (IV SOLUTION) IMPLANT
ORTHOPRO CANNULATED SCREW SYSTEM ×2 IMPLANT
PACK BASIN DAY SURGERY FS (CUSTOM PROCEDURE TRAY) ×2 IMPLANT
PADDING CAST ABS 4INX4YD NS (CAST SUPPLIES) ×1
PADDING CAST ABS COTTON 4X4 ST (CAST SUPPLIES) ×1 IMPLANT
PENCIL BUTTON HOLSTER BLD 10FT (ELECTRODE) IMPLANT
SCREW CANN 2.0X35 (Screw) ×2 IMPLANT
SHEET MEDIUM DRAPE 40X70 STRL (DRAPES) ×2 IMPLANT
SPONGE GAUZE 2X2 8PLY STRL LF (GAUZE/BANDAGES/DRESSINGS) ×5 IMPLANT
SPONGE GAUZE 4X4 12PLY (GAUZE/BANDAGES/DRESSINGS) ×2 IMPLANT
STOCKINETTE 6  STRL (DRAPES)
STOCKINETTE 6 STRL (DRAPES) ×1 IMPLANT
SUT ETHILON 4 0 PS 2 18 (SUTURE) IMPLANT
SUT ETHILON 5 0 PS 2 18 (SUTURE) ×3 IMPLANT
SUT VIC AB 3-0 PS1 18 (SUTURE)
SUT VIC AB 3-0 PS1 18XBRD (SUTURE) IMPLANT
SUT VIC AB 4-0 P-3 18XBRD (SUTURE) IMPLANT
SUT VIC AB 4-0 P3 18 (SUTURE)
SUT VIC AB 5-0 P-3 18X BRD (SUTURE) IMPLANT
SUT VIC AB 5-0 P3 18 (SUTURE)
SUT VIC AB 5-0 PS2 18 (SUTURE) IMPLANT
SUT VICRYL 4-0 PS2 18IN ABS (SUTURE) IMPLANT
SYR 3ML 18GX1 1/2 (SYRINGE) ×2 IMPLANT
SYR BULB 3OZ (MISCELLANEOUS) ×2 IMPLANT
SYR CONTROL 10ML LL (SYRINGE) ×3 IMPLANT

## 2013-04-20 NOTE — Anesthesia Preprocedure Evaluation (Addendum)
Anesthesia Evaluation  Patient identified by MRN, date of birth, ID band Patient awake    Reviewed: Allergy & Precautions, H&P , NPO status , Patient's Chart, lab work & pertinent test results  Airway Mallampati: II TM Distance: <3 FB Neck ROM: Full    Dental   Pulmonary former smoker,  breath sounds clear to auscultation        Cardiovascular Rhythm:Regular Rate:Normal     Neuro/Psych    GI/Hepatic   Endo/Other    Renal/GU      Musculoskeletal   Abdominal   Peds  Hematology   Anesthesia Other Findings   Reproductive/Obstetrics                         Anesthesia Physical Anesthesia Plan  ASA: I  Anesthesia Plan: MAC   Post-op Pain Management:    Induction: Intravenous  Airway Management Planned: Simple Face Mask  Additional Equipment:   Intra-op Plan:   Post-operative Plan:   Informed Consent: I have reviewed the patients History and Physical, chart, labs and discussed the procedure including the risks, benefits and alternatives for the proposed anesthesia with the patient or authorized representative who has indicated his/her understanding and acceptance.     Plan Discussed with: CRNA and Surgeon  Anesthesia Plan Comments: (Pt aware of dangers of leaving nose stud in place during surgery. States it can not come out and does not want it cut off. Wil proceed with stud in place and do everything we can do to avoid problems. GES)      Anesthesia Quick Evaluation

## 2013-04-20 NOTE — Anesthesia Postprocedure Evaluation (Signed)
  Anesthesia Post-op Note  Patient: Melissa [name]  Procedure(s) Performed: Procedure(s) with comments: BILATERAL HAMMER TOE REPAIR AND EXCISION NAIL PERMANENT LEFT FIRST (Bilateral) - Hammer toe repair fourth and fifth toes bilateral feet with screw placement fourth toes and partial nail excision left great toe  Patient Location: PACU  Anesthesia Type:MAC  Level of Consciousness: awake and alert   Airway and Oxygen Therapy: Patient Spontanous Breathing  Post-op Pain: mild  Post-op Assessment: Post-op Vital signs reviewed, Patient's Cardiovascular Status Stable, Respiratory Function Stable, Patent Airway, No signs of Nausea or vomiting and Pain level controlled  Post-op Vital Signs: Reviewed and stable  Last Vitals:  Filed Vitals:   04/20/13 1448  BP:   Pulse: 59  Temp:   Resp: 19    Complications: No apparent anesthesia complications

## 2013-04-20 NOTE — Brief Op Note (Signed)
04/20/2013  2:33 PM  PATIENT:  Melissa [name]  38 y.o. female  PRE-OPERATIVE DIAGNOSIS:  BILATERAL 4TH/5TH HAMMER TOES, INGROWN TOENAIL  POST-OPERATIVE DIAGNOSIS:  BILATERAL 4TH/5TH HAMMER TOES, INGROWN TOENAIL  PROCEDURE:  Procedure(s) with comments: BILATERAL HAMMER TOE REPAIR AND EXCISION NAIL PERMANENT LEFT FIRST (Bilateral) - Hammer toe repair fourth and fifth toes bilateral feet with screw placement fourth toes and partial nail excision left great toe  SURGEON:  Surgeon(s) and Role:    * Harriet Masson, DPM - Primary  PHYSICIAN ASSISTANT:   ASSISTANTS: none   ANESTHESIA:   local and MAC  EBL:  Total I/O In: 1600 [I.V.:1600] Out: -   BLOOD ADMINISTERED:none  DRAINS: none   LOCAL MEDICATIONS USED:  MARCAINE    and XYLOCAINE   SPECIMEN:  No Specimen  DISPOSITION OF SPECIMEN:  N/A  COUNTS:  YES  TOURNIQUET:   Total Tourniquet Time Documented: area (Right) - 43 minutes area (Right) - 33 minutes Total: area (Right) - 76 minutes   DICTATION: .Viviann Spare Dictation  PLAN OF CARE: Discharge to home after PACU  PATIENT DISPOSITION:  PACU - hemodynamically stable.   Delay start of Pharmacological VTE agent (>24hrs) due to surgical blood loss or risk of bleeding: not applicable

## 2013-04-20 NOTE — Discharge Instructions (Signed)
Hammer Toes Hammer toes is a condition in which one or more of your toes is permanently flexed. CAUSES  This happens when a muscle imbalance or abnormal bone length makes your small toes buckle. This causes the toe joint to contract and the strong cord-like bands that attach muscles to the bones (tendons) in your toes to shorten.  SIGNS AND SYMPTOMS  Common symptoms of flexible hammer toes include:   A build up of skin cells (Corns). Corns occur where boney bumps come in frequent contact with hard surfaces. For example, where your shoes press and rub.  Irritation.  Inflammation.  Pain.  Limited motion in your toes DIAGNOSIS  Hammer toes are diagnosed through a physical exam of your toes.During the exam, your health care provider may try to reproduce your symptoms by manipulating your foot. Often, x-ray exams are done to determine the degree of deformity and to make sure that the cause is not a fracture.  TREATMENT  Hammer toes can be treated with corrective surgery. There are several types of surgical procedures that can treat hammer toes. The most common procedures include:  Arthroplasty A portion of the joint is surgically removed and your toe is straightened. The gap fills in with fibrous tissue. This procedure helps treat pain and deformity and helps restore function.  Fusion Cartilage between the two bones of the affected joint is taken out and the bones fuse together into one longer bone. This helps keep your toe stable and reduces pain but leaves your toe stiff, yet straight.  Implantation A portion of your bone is removed and replaced with an implant to restore motion.  Flexor tendon transfers This procedure repositions the tendons that curl the toes down (flexor tendons). This may be done to release the deforming force that causes your toe to buckle. Several of these procedures require fixing your toe with a pin that is visible at the tip of your toe. The pin keeps the toe  straight during healing. Your health care provider will remove the pin usually within 4 8 weeks after the procedure.  Document Released: 12/16/1999 Document Revised: 10/08/2012 Document Reviewed: 08/25/2012 [facility] Patient Information 2014 [place].    Post Anesthesia Home Care Instructions  Activity: Get plenty of rest for the remainder of the day. A responsible adult should stay with you for 24 hours following the procedure.  For the next 24 hours, DO NOT: -Drive a car -Paediatric nurse -Drink alcoholic beverages -Take any medication unless instructed by your physician -Make any legal decisions or sign important papers.  Meals: Start with liquid foods such as gelatin or soup. Progress to regular foods as tolerated. Avoid greasy, spicy, heavy foods. If nausea and/or vomiting occur, drink only clear liquids until the nausea and/or vomiting subsides. Call your physician if vomiting continues.  Special Instructions/Symptoms: Your throat may feel dry or sore from the anesthesia or the breathing tube placed in your throat during surgery. If this causes discomfort, gargle with warm salt water. The discomfort should disappear within 24 hours.   Call your surgeon if you experience:   1.  Fever over 101.0. 2.  Inability to urinate. 3.  Nausea and/or vomiting. 4.  Extreme swelling or bruising at the surgical site. 5.  Continued bleeding from the incision. 6.  Increased pain, redness or drainage from the incision. 7.  Problems related to your pain medication.

## 2013-04-20 NOTE — Op Note (Signed)
Surgeon: Harriet Masson   Assistants: None  Anesthesia: Monitored Local Anesthesia with Sedation  ASA Class: 1 Preoperative diagnosis: Hammertoe deformity fourth and fifth toes bilateral, ingrown nail spicule lateral border left hallux Postoperative diagnoses: The same Procedure: #1 through 4 hammertoe repair fourth and fifth digits bilateral with screw fixation of fourth digits bilateral.   #5 phenol matricectomy with partial nail excision lateral left great toe  Indications for surgery: Patient has a long-standing history of hammertoe deformities has had previous surgery on hammertoes 2 and 3 and fourth toes however the fourth toes failed to arthrodesis and are to remain to be unstable and rotated in adductovarus fashion. Patient these have pain with enclosed shoes and activities on her fourth and fifth toes of both feet due to the digital contracture and under lapping of the toes. Patient also has a recurrent spicule of nail lateral nail fold left great toe having had previous partial nail excision with matricectomy several years ago. Based on continued symptoms of pain discomfort and having failed conservative care including changing shoes padding cushioning etc. patient is a candidate for surgery at this time per patient request my recommendation.  Hemostasis: Ankle tourniquets bilateral at 250 mmHg  Estimated blood loss: Minimal less than 1 cc total.  Meds and injectables. Patient was administered total of 20 cc 50-50 mixture of 2% Xylocaine plain and 0.5% Marcaine block to the fourth and fifth toes as well as of left hallux. This was done preoperatively under IV sedation. Intraoperatively patient was also given one half cc dexamethasone phosphate 10 mg per cc to the fourth and fifth MTP areas. Bilateral  Findings and procedures: Patient brought to the OR and placed on table in the supine position IV sedation was established both feet were then a infiltrated with a total of 20 cc 50-50  mixture to Xylocaine plain and 0.5% Marcaine plain for digital blocks for the fourth and fifth toes and the left hallux. Betadine prep was performed in the usual aseptic manner. The right foot was then exsanguinated with the Esmarch wrap and ankle tourniquet inflated to 25 mm mercury following procedures were then carried out.  #1 Hammertoe repair with derotational arthroplasty fifth toe right foot. Attention was directed to the dorsolateral aspect of the fifth toe right foot 2 similar elliptical converging incision made in an oblique fashion and distal to proximal the tonsilliths of skin was excised and dissection deepened to the level of capsular structures were transverse capsular incision was made at the proximal IP joint medial and lateral collaterals were freed in a proximal phalanx delivered the operative field lysing parts rotation of proximal phalangeal head was resected perpendicular to the shaft. At this time the site was lavaged was also [employer] solution cleared of all soft tissue and osseous debris capsule was then evaluated and not closed however skin was reapproximated utilizing 5-0 nylon in a continuous interlocking fashion. Closure produced a derotation of the toe in a rectus position.  #2 Hammertoe repair fourth digit with screw placement right foot.  Attention was now directed to the fourth digit of the right foot were a linear skin incision is made overlying the proximal IP joints. The incision was deepened via sharp dissection to the level of capsular structures having a previous surgery a transverse capsular incision was made and the residual head of proximal phalanx was delivered the operative field lysing parts rotation the proximal phalangeal head was reexcised are freshened down to the bone surface this was now directed to the  base of middle phalanx which was also debrided utilizing bone forceps and rongeur. Once active bone was exposed on both ends of fixation of the toe was  accomplished as follows Ortho-Pro cannulated 2.0 mm screw system was utilized. At this time the guidewire was placed at the base of the proximal phalanx exiting the distal tuft of the fourth digit the wire was then driven back in a retrograde fashion across the osteotomy site into the base of the proximal phalanx. This produced a rigid fixation this was done under Xi-scan fluoroscopy. At this time the toe was then fixated with a after appropriate measurements 8 2 mm x 35 mm cancellus digital screw was placed over the wire and driven into the base the proximal phalanx producing a rigid fixation the guidewire was removed and digit was noted to be in rectus alignment at this time closure was accomplished utilizing 5-0 nylon to reapproximate skin and the distal tuft in the dorsum of the fourth digit. Upon completion of both procedures of the right foot the sites were infiltrated with a total of 1/2 cc dexamethasone phosphate Betadine Xeroform and a dry sterile compressive dressing was applied to the right foot and digits. Ankle tourniquet was deflated with immediate return of perfusion to all digits on the right foot noted.   attention was now directed to the left foot which was exsanguinated with the Esmarch wrap and ankle tourniquet inflated to 250 mm mercury the following procedure carried out on the left foot  #3 Hammertoe repair with derotational arthroplasty fifth toe left foot. The exact same procedures performed on the fifth toe right foot was repeated in identical fashion with proximal fold you have resection and 5-0 nylon skin closure.  #4 Hammertoe repair fourth toe left foot with screw placement. The exact same procedures performed on the fourth toe of the right foot was repeated in identical fashion with also using a 2 mm right 35 mm cancellus screw for digital fixation. Upon completion of the osteotomy procedures and nylon closure of the fourth toe the forefoot fourth and fifth digits were dressed  and the fashion after injecting 1/2 cc dexamethasone phosphate to the fourth and fifth MTP areas dry sterile compressive dressing was applied identical fashion on the left foot as on the right however prior to releasing the digital tourniquet the fifth procedure was carried out on the left great toe.  #5 Partial nail reexcision with phenol matricectomy left great toe. Attention was directed lateral border of the left hallux a small spicule of the proximal nail fold was identified utilizing cutting forceps the lateral nail edge was smooth and a 15 blade was utilized to incise the nail back to the proximal nail matrix utilizing hemostats the nail matrix the proximal nail fold was excised with a small spicule been removed and remaining nail matrix tissue being removed the rough and are scarred nail border along the left hallux was also smoothed and at this time 3 applications of phenol O53 seconds each followed by an alcohol wash were performed Betadine ointment and a gauze dressing was then applied to the right correction left hallux which was incorporated into the forefoot dressing of the left foot. At this time ankle tourniquet was deflated with immediate return of perfusion to all digits of the left foot as well.  Following all 5 procedures the patient digital tourniquets were deflated adequate perfusion was noted to all remaining toes the patient was returned from the arch recovery in satisfactory condition. Patient will be  discharged per anesthesia. Patient given prescriptions for oral pain antibiotic medication and appointment for followup office visit [facility] for postop management and care. Patient will be maintained weightbearing although minimal activities in [place] shoes bilateral for at least a 1 month period  Harriet Masson DPM

## 2013-04-20 NOTE — Transfer of Care (Signed)
Immediate Anesthesia Transfer of Care Note  Patient: Melissa [name]  Procedure(s) Performed: Procedure(s) with comments: BILATERAL HAMMER TOE REPAIR AND EXCISION NAIL PERMANENT LEFT FIRST (Bilateral) - Hammer toe repair fourth and fifth toes bilateral feet with screw placement fourth toes and partial nail excision left great toe  Patient Location: PACU  Anesthesia Type:MAC  Level of Consciousness: awake, alert , oriented and patient cooperative  Airway & Oxygen Therapy: Patient Spontanous Breathing and Patient connected to face mask oxygen  Post-op Assessment: Report given to PACU RN and Post -op Vital signs reviewed and stable  Post vital signs: Reviewed and stable  Complications: No apparent anesthesia complications

## 2013-04-20 NOTE — H&P (Signed)
Melissa [name] is an 38 y.o. female.   Chief Complaint: Painful hammertoe deformity bilateral fourth and fifth toes with adductovarus rotation. Patient also has a regrowth of nail spicule left great toe lateral border. HPI: Patient has had several year history of deformities has had previous surgical correction for the second and third toes bilateral and fourth toe is undergone surgery however not successful fusion, patient is tried changing shoes wearing wider more accommodative shoes cushions anti-inflammatories all with temporary relief at best. Radiographs confirm digital deformities adductovarus rotation is an instability is noted clinically in particular fourth toes bilateral causing pain discomfort difficulty with enclosed shoes and ambulation.  Past Medical History  Diagnosis Date  . Hammer toe 04/2013    bilateral 4th and 5th   . Ingrown left big toenail 04/2013    Past Surgical History  Procedure Laterality Date  . Cryoablation  2004  . Wisdom tooth extraction    . Dilation and evacuation  12/21/2011    Procedure: DILATATION AND EVACUATION;  Surgeon: Terrance Mass, MD;  Location: [facility];  Service: Gynecology;  Laterality: N/A;  first trimester   . Cystoscopy with hydrodistension and biopsy  07/28/2004    with urethral dilatation  . Correction hammer toe Bilateral   . Toe surgery      ingrown toenail    Family History  Problem Relation Age of Onset  . Cancer Father   . Birth defects Paternal Grandfather    Social History:  reports that she quit smoking about 3 years ago. She has never used smokeless tobacco. She reports that she drinks alcohol. She reports that she does not use illicit drugs.  Allergies: No Known Allergies  Medications Prior to Admission  Medication Sig Dispense Refill  . cholecalciferol (VITAMIN D) 1000 UNITS tablet Take 2,000 Units by mouth daily.      . Multiple Vitamins-Minerals (MULTIVITAMIN WITH MINERALS) tablet Take 1 tablet  by mouth daily.      . vitamin C (ASCORBIC ACID) 500 MG tablet Take 500 mg by mouth daily.      . Ciclopirox 1 % shampoo Apply 1 application topically 2 (two) times daily.  120 mL  3  . vitamin E 1000 UNIT capsule Take 1 capsule (1,000 Units total) by mouth daily.  120 capsule  2    No results found for this or any previous visit (from the past 48 hour(s)). No results found.  Review of Systems  Constitutional: Negative.  Negative for fever.  HENT: Negative.   Eyes: Negative.   Respiratory: Negative.   Cardiovascular: Negative.  Negative for claudication and leg swelling.  Gastrointestinal: Negative.   Genitourinary: Negative.   Skin: Negative.   Neurological: Negative.  Negative for weakness.  Endo/Heme/Allergies: Negative.   Psychiatric/Behavioral: Negative.   All other systems reviewed and are negative.   Height 5\' 1"  (1.549 m), weight 157 lb (71.215 kg), last menstrual period 03/21/2013. Physical Exam  Constitutional: She is oriented to person, place, and time. She appears well-developed and well-nourished.  HENT:  Head: Normocephalic.  Cardiovascular: Normal rate, regular rhythm and intact distal pulses.   Pulses:      Dorsalis pedis pulses are 2+ on the right side, and 2+ on the left side.       Posterior tibial pulses are 2+ on the right side, and 2+ on the left side.  Vascular status to lower extremities is intact and symmetric bilateral pedal pulses palpable skin temperature warm capillary refill timed 3-4 seconds  all digits no varicosities noted no open wounds or ulcerations noted.  Respiratory: Effort normal and breath sounds normal. No respiratory distress.  Musculoskeletal: Normal range of motion. She exhibits no edema.       Right foot: She exhibits deformity.       Left foot: She exhibits deformity.  Bilateral hammertoe deformities fourth and fifth toes with adductovarus rotation is are noted.  Neurological: She is alert and oriented to person, place, and time.  She has normal strength and normal reflexes. No sensory deficit.  Reflex Scores:      Patellar reflexes are 2+ on the right side and 2+ on the left side.      Achilles reflexes are 2+ on the right side and 2+ on the left side. Neurovascular status is intact bilateral with intact epicritic and proprioceptive sensations to both feet there is normal plantar response and DTRs noted.  Skin: Skin is warm, dry and intact. No cyanosis. Nails show no clubbing.  Lower extremity dermatologically relatively normal skin texture and turgor decreased absent hair growth the lower extremities there is a recurrent nail spicule lateral border left great toe requiring redo of AP or phenol matricectomy.  Psychiatric: She has a normal mood and affect. Thought content normal.     Assessment/Plan Hammertoe deformity fourth and fifth toes bilateral: Plan at this time is for surgical correction with hammertoe repair including screw placement fourth toes bilateral for arthrodesis and derotation of fifth digits bilateral  Ingrown nail lateral left hallux nail spicule regrowth, plan at this time is nail excision partial with phenol matricectomy left great toe.  There no contraindications surgery based on clinical radiographic findings and history surgery proceeded scheduled per patient request my recommendation. Patient will followup early postoperatively at the [facility] office[facility]clinic for postop management for minimal 3 months until symptoms and postoperative healing is complete.  Brenn Gatton DPM  Eivin Mascio Blenda Mounts 04/20/2013, 11:05 AM

## 2013-04-20 NOTE — Progress Notes (Signed)
Patient refused to remove nose ring, Dr Tamala Julian with anesthesia and Dr Blenda Mounts made aware.

## 2013-04-27 ENCOUNTER — Encounter (HOSPITAL_BASED_OUTPATIENT_CLINIC_OR_DEPARTMENT_OTHER): Payer: Self-pay

## 2013-04-28 ENCOUNTER — Ambulatory Visit (INDEPENDENT_AMBULATORY_CARE_PROVIDER_SITE_OTHER): Payer: No Typology Code available for payment source

## 2013-04-28 ENCOUNTER — Ambulatory Visit (INDEPENDENT_AMBULATORY_CARE_PROVIDER_SITE_OTHER): Payer: Self-pay

## 2013-04-28 VITALS — BP 126/84 | HR 71 | Resp 16

## 2013-04-28 DIAGNOSIS — L600 Ingrowing nail: Secondary | ICD-10-CM

## 2013-04-28 DIAGNOSIS — M2040 Other hammer toe(s) (acquired), unspecified foot: Secondary | ICD-10-CM

## 2013-04-28 DIAGNOSIS — Z09 Encounter for follow-up examination after completed treatment for conditions other than malignant neoplasm: Secondary | ICD-10-CM

## 2013-04-28 DIAGNOSIS — M79609 Pain in unspecified limb: Secondary | ICD-10-CM

## 2013-04-28 NOTE — Progress Notes (Signed)
   Subjective:    Patient ID: Melissa [name], female    DOB: [birth year], 38 y.o.   MRN: [id number]  HPI Comments: "They have hurt so bad. I have not been able to sleep"  DOS 04-20-2013 POV Hammer toe repair 4th and 5th bilateral, AP nail procedure lateral border hallux left     Review of Systems no new systemic findings or changes noted     Objective:   Physical Exam Neurovascular status is intact pedal pulses are palpable epicritic and proprioceptive sensations intact and symmetric bilateral there is normal plantar response DTRs not listed dressings intact and dry at this time x-rays reveal good position the digits clinically and radiographically the screw of the fourth toe right foot is somewhat through the cortex and mid of the proximal phalanx however for the most part appears to be good clinical alignment and will be left in place as this was is causing problems down the road which may require removal. At this time the digits are good position dressings intact and dry mild serous drainage from the hallux lateral border of left following AP nail procedure this is cleansed with peroxide Neosporin and Band-Aid applied remainder both feet fourth and fifth digits are redressed in a dry sterile gauze dressing reappointed one week for followup       Assessment & Plan:  Assessment good postop progress patient's complaint some mild edema and throbbing keep her awake at night advised to keep her feet elevated more she is not elevated above her heart at this time elevate and also recommended Advil or ibuprofen as needed for pain dressings are re\re change at this time patient can improvement within the dressing is a previous dressing Type mild edema mild ecchymosis consistent with postop course was noted no dehiscence no discharge no signs of infection no other complications noted at this time recheck in one week for further postop followup and possible suture what that time. Next  Harriet Masson  DPM

## 2013-04-28 NOTE — Patient Instructions (Signed)
ICE INSTRUCTIONS  Apply ice or cold pack to the affected area at least 3 times a day for 10-15 minutes each time.  You should also use ice after prolonged activity or vigorous exercise.  Do not apply ice longer than 20 minutes at one time.  Always keep a cloth between your skin and the ice pack to prevent burns.  Being consistent and following these instructions will help control your symptoms.  We suggest you purchase a gel ice pack because they are reusable and do bit leak.  Some of them are designed to wrap around the area.  Use the method that works best for you.  Here are some other suggestions for icing.   Use a frozen bag of peas or corn-inexpensive and molds well to your body, usually stays frozen for 10 to 20 minutes.  Wet a towel with cold water and squeeze out the excess until it's damp.  Place in a bag in the freezer for 20 minutes. Then remove and use.  Keep foot elevated and use ice pack 2 or 3 times daily for any aching and throbbing that occurs

## 2013-05-04 ENCOUNTER — Ambulatory Visit (INDEPENDENT_AMBULATORY_CARE_PROVIDER_SITE_OTHER): Payer: Self-pay

## 2013-05-04 DIAGNOSIS — M2040 Other hammer toe(s) (acquired), unspecified foot: Secondary | ICD-10-CM

## 2013-05-04 NOTE — Progress Notes (Signed)
   Subjective:    Patient ID: Melissa [name], female    DOB: 1975-09-25, 38 y.o.   MRN: [id number]  HPI  SUTURE REMOVED AND PT . RETURN APPOINTMENT IN 2 WEEKS FOR 1 MONTH POSTOP.    Review of Systems     Objective:   Physical Exam        Assessment & Plan:

## 2013-05-19 ENCOUNTER — Ambulatory Visit (INDEPENDENT_AMBULATORY_CARE_PROVIDER_SITE_OTHER): Payer: Self-pay

## 2013-05-19 VITALS — BP 113/74 | HR 87 | Resp 12

## 2013-05-19 DIAGNOSIS — Z09 Encounter for follow-up examination after completed treatment for conditions other than malignant neoplasm: Secondary | ICD-10-CM

## 2013-05-19 DIAGNOSIS — M2040 Other hammer toe(s) (acquired), unspecified foot: Secondary | ICD-10-CM

## 2013-05-19 NOTE — Progress Notes (Signed)
   Subjective:    Patient ID: Melissa [name], female    DOB: 21-Feb-1975, 38 y.o.   MRN: [id number]  HPI DOS 04-20-13 POV HAMMER TOE REPAIR 4TH AND 5TH B/L. '' THE TOES A LITTLE TENDER, BUT DOING OK.''   Review of Systems no new systemic changes or findings noted     Objective:   Physical Exam Neurovascular status is intact pedal pulses palpable there is some edema noted in the fourth and fifth toes bilateral x-rays reveal intact screw fixation fourth digits of the fourth is right foot does have some displacement of the screw although still some rectus with mild edema noted mild discomfort noted otherwise good postop progress good clinical alignment of toes is noted.       Assessment & Plan:  Assessment good postop progress maintain Coflex wrap in with additional was and Coflex dispensed may discontinue surgical shoe return to come for walking tennis or athletic shoes no dress shoes her heels at this time recheck in one to 2 months for further postop followup we'll for the discussed possibly a pin removal her screw removal somewhere between 2-3 months postop typically. Reappointed one to 2 months for followup and reevaluation x-rays  Harriet Masson DPM

## 2013-05-19 NOTE — Patient Instructions (Signed)

## 2013-05-27 ENCOUNTER — Ambulatory Visit: Payer: Medicaid Other | Attending: Internal Medicine | Admitting: Internal Medicine

## 2013-05-27 ENCOUNTER — Encounter: Payer: Self-pay | Admitting: Internal Medicine

## 2013-05-27 VITALS — BP 112/79 | HR 72 | Temp 99.2°F | Resp 16 | Ht 61.0 in | Wt 161.0 lb

## 2013-05-27 DIAGNOSIS — R350 Frequency of micturition: Secondary | ICD-10-CM

## 2013-05-27 DIAGNOSIS — R61 Generalized hyperhidrosis: Secondary | ICD-10-CM

## 2013-05-27 DIAGNOSIS — Z87891 Personal history of nicotine dependence: Secondary | ICD-10-CM | POA: Insufficient documentation

## 2013-05-27 DIAGNOSIS — N393 Stress incontinence (female) (male): Secondary | ICD-10-CM

## 2013-05-27 DIAGNOSIS — R319 Hematuria, unspecified: Secondary | ICD-10-CM | POA: Diagnosis not present

## 2013-05-27 LAB — POCT URINALYSIS DIPSTICK
Bilirubin, UA: NEGATIVE
Glucose, UA: NEGATIVE
Ketones, UA: NEGATIVE
Leukocytes, UA: NEGATIVE
Nitrite, UA: NEGATIVE
Spec Grav, UA: >=1.03
Urobilinogen, UA: 0.2
pH, UA: 5.5

## 2013-05-27 MED ORDER — SULFAMETHOXAZOLE-TMP DS 800-160 MG PO TABS: 1.0000 | ORAL_TABLET | Freq: Two times a day (BID) | ORAL | Status: DC

## 2013-05-27 NOTE — Progress Notes (Signed)
Pt is here having discomfort and cramping at her lower abdomen. Pt states that she is having urgency and polyuria. Pt states that her hormones are out of control.

## 2013-05-27 NOTE — Progress Notes (Signed)
Patient ID: Melissa [name], female   DOB: [birth year], 38 y.o.   MRN: [id number]  CC: frequent urination and night sweats  HPI: Patient reports that for two years she has had urinary leakage when laughing, sneezing, and coughing.  She has noted a progression in the problem for the past year.  For the past year she has noted increased urinary frequency, back pain, and pelvic pain.  Patient reports that the urinary frequency and back pain is worst when she has her menstrual cycle.  She also c/o night sweats.  She reports that she changes clothes twice a night and has to take a second shower due to the volume of sweat.  She reports normal regular monthly cycles with no associated pain with menstruation. She denies fevers or chills. She denies unprotected sex or contact with STD's. She reports that she was seen by gynecology earlier this year for night sweats and hot flashes to have hormone levels drawn and they were all normal.  She is upset that no one is able to explain why she is having night sweats.     No Known Allergies Past Medical History  Diagnosis Date  . Hammer toe 04/2013    bilateral 4th and 5th   . Ingrown left big toenail 04/2013   Current Outpatient Prescriptions on File Prior to Visit  Medication Sig Dispense Refill  . cephALEXin (KEFLEX) 500 MG capsule Take 1 capsule (500 mg total) by mouth 4 (four) times daily.  20 capsule  0  . cholecalciferol (VITAMIN D) 1000 UNITS tablet Take 2,000 Units by mouth daily.      Marland Kitchen HYDROcodone-acetaminophen (NORCO/VICODIN) 5-325 MG per tablet Take 1 tablet by mouth every 4 (four) hours as needed.  50 tablet  0  . vitamin C (ASCORBIC ACID) 500 MG tablet Take 500 mg by mouth daily.      . vitamin E 1000 UNIT capsule Take 1 capsule (1,000 Units total) by mouth daily.  120 capsule  2   No current facility-administered medications on file prior to visit.   Family History  Problem Relation Age of Onset  . Cancer Father   . Birth defects Paternal  Grandfather    History   Social History  . Marital Status: Single    Spouse Name: N/A    Number of Children: N/A  . Years of Education: N/A   Occupational History  . Not on file.   Social History Main Topics  . Smoking status: Former Smoker -- 0.50 packs/day for 13 years    Quit date: 12/31/2009  . Smokeless tobacco: Never Used  . Alcohol Use: Yes     Comment: occasionally  . Drug Use: No  . Sexual Activity: Yes    Birth Control/ Protection: None   Other Topics Concern  . Not on file   Social History Narrative  . No narrative on file    Review of Systems: See HPI  Objective:   Filed Vitals:   05/27/13 1235  BP: 112/79  Pulse: 72  Temp: 99.2 F (37.3 C)  Resp: 16   Physical Exam  Vitals reviewed. Constitutional: She is oriented to person, place, and time.  Cardiovascular: Normal rate, regular rhythm and normal heart sounds.   Pulmonary/Chest: Effort normal and breath sounds normal.  Abdominal: Soft. Bowel sounds are normal. There is tenderness (pelvic).  Neurological: She is alert and oriented to person, place, and time.  Skin: Skin is warm and dry. No rash noted.  Psychiatric: She has a  normal mood and affect. Thought content normal.     Lab Results  Component Value Date   WBC 8.4 12/31/2012   HGB 13.7 04/20/2013   HCT 42.9 12/31/2012   MCV 91.9 12/31/2012   PLT 346 12/31/2012   Lab Results  Component Value Date   CREATININE 0.64 12/31/2012   BUN 10 12/31/2012   NA 139 12/31/2012   K 4.1 12/31/2012   CL 102 12/31/2012   CO2 25 12/31/2012    Lab Results  Component Value Date   HGBA1C 5.5 05/02/2012   Lipid Panel     Component Value Date/Time   CHOL 181 12/31/2012 1633   TRIG 111 12/31/2012 1633   HDL 42 12/31/2012 1633   CHOLHDL 4.3 12/31/2012 1633   VLDL 22 12/31/2012 1633   LDLCALC 117* 12/31/2012 1633       Assessment and plan:   Melissa [name] was seen today for follow-up.  Diagnoses and associated orders for this  visit:  Hematuria - sulfamethoxazole-trimethoprim (BACTRIM DS) 800-160 MG per tablet; Take 1 tablet by mouth 2 (two) times daily. Will treat for UTI to see if this improves symptoms of pelvic/back pain and urinary frequency  Stress incontinence - Ambulatory referral to Urology  Urinary frequency - Urinalysis Dipstick  Night sweats - Thyroid Panel With TSH; Future Past hormone levels are normal. Advised patient to try black cohosh for hot flashes and night sweats.   Patient will return in one week for repeat urine dipstick to assess for blood and a thyroid panel with TSH. If blood is still present in urine she will need to be evaluated by urology.   Return in about 1 week (around 06/03/2013) for repeat urine dipstick.      Lance Bosch, [facility] [phone] 05/28/2013, 9:02 PM

## 2013-05-27 NOTE — Patient Instructions (Addendum)
Black cohosh

## 2013-05-29 ENCOUNTER — Telehealth: Payer: Self-pay

## 2013-05-29 ENCOUNTER — Telehealth: Payer: Self-pay | Admitting: Internal Medicine

## 2013-05-29 ENCOUNTER — Other Ambulatory Visit: Payer: Self-pay | Admitting: Internal Medicine

## 2013-05-29 DIAGNOSIS — R319 Hematuria, unspecified: Secondary | ICD-10-CM

## 2013-05-29 MED ORDER — SULFAMETHOXAZOLE-TMP DS 800-160 MG PO TABS: 1.0000 | ORAL_TABLET | Freq: Two times a day (BID) | ORAL | Status: DC

## 2013-05-29 NOTE — Telephone Encounter (Signed)
PT. Is unable to locate medication sulfamethoxazole-trimethoprim (BACTRIM DS) 800-160 MG per tablet [[id number]]), and would like for another prescription to be sent to [facility])... Please call patient

## 2013-06-02 ENCOUNTER — Telehealth: Payer: Self-pay | Admitting: Internal Medicine

## 2013-06-02 NOTE — Telephone Encounter (Signed)
Pt has called in today to request that a nurse or Doctor call her back; pt did not want to disclose the nature of her request and preferred to speak with a medical professional;

## 2013-06-03 ENCOUNTER — Other Ambulatory Visit: Payer: Self-pay

## 2013-06-05 ENCOUNTER — Ambulatory Visit: Payer: No Typology Code available for payment source

## 2013-06-05 ENCOUNTER — Ambulatory Visit: Payer: No Typology Code available for payment source | Attending: Internal Medicine

## 2013-06-05 ENCOUNTER — Telehealth: Payer: Self-pay

## 2013-06-05 DIAGNOSIS — R61 Generalized hyperhidrosis: Secondary | ICD-10-CM

## 2013-06-05 MED ORDER — CYCLOBENZAPRINE HCL 10 MG PO TABS: 10.0000 mg | ORAL_TABLET | Freq: Three times a day (TID) | ORAL | Status: DC | PRN

## 2013-06-05 NOTE — Telephone Encounter (Signed)
Returned patient call Patient states she already got things straightened out

## 2013-06-06 LAB — THYROID PANEL WITH TSH
Free Thyroxine Index: 2.7 (ref 1.0–3.9)
T3 Uptake: 34.2 % (ref 22.5–37.0)
T4, Total: 8 ug/dL (ref 5.0–12.5)
TSH: 1.007 u[IU]/mL (ref 0.350–4.500)

## 2013-06-09 ENCOUNTER — Telehealth: Payer: Self-pay

## 2013-06-09 NOTE — Telephone Encounter (Signed)
Patient is aware of her lab results 

## 2013-06-09 NOTE — Telephone Encounter (Signed)
Message copied by Dorothe Pea on Tue Jun 09, 2013 10:12 AM ------      Message from: Lance Bosch      Created: Sun Jun 07, 2013  6:43 PM       Let patient know thyroid panel is normal. Thanks ------

## 2013-06-26 ENCOUNTER — Encounter: Payer: Self-pay | Admitting: Nurse Practitioner

## 2013-06-26 ENCOUNTER — Ambulatory Visit (INDEPENDENT_AMBULATORY_CARE_PROVIDER_SITE_OTHER): Payer: No Typology Code available for payment source | Admitting: Nurse Practitioner

## 2013-06-26 VITALS — BP 112/78 | HR 81 | Temp 98.1°F | Ht 61.0 in | Wt 159.6 lb

## 2013-06-26 DIAGNOSIS — Z01419 Encounter for gynecological examination (general) (routine) without abnormal findings: Secondary | ICD-10-CM

## 2013-06-26 DIAGNOSIS — R3915 Urgency of urination: Secondary | ICD-10-CM

## 2013-06-26 DIAGNOSIS — Z0000 Encounter for general adult medical examination without abnormal findings: Secondary | ICD-10-CM

## 2013-06-26 DIAGNOSIS — R350 Frequency of micturition: Secondary | ICD-10-CM

## 2013-06-26 DIAGNOSIS — R61 Generalized hyperhidrosis: Secondary | ICD-10-CM | POA: Insufficient documentation

## 2013-06-26 LAB — POCT URINALYSIS DIP (DEVICE)
Glucose, UA: NEGATIVE mg/dL
Ketones, ur: NEGATIVE mg/dL
Leukocytes, UA: NEGATIVE
Nitrite: NEGATIVE
Protein, ur: NEGATIVE mg/dL
Specific Gravity, Urine: 1.025 (ref 1.005–1.030)
Urobilinogen, UA: 0.2 mg/dL (ref 0.0–1.0)
pH: 5 (ref 5.0–8.0)

## 2013-06-26 NOTE — Progress Notes (Signed)
History:  Melissa [name] is a 38 y.o. G4P0040 who [facility] today for ongoing night sweats for 2 years. She awakes nightly x 2 and has to change her pajamas and shower twice nightly. This is greatly impacting her life. She has also developed SOB with mild exertion, like one flight of stairs, for one year. She has a brother and father with TB. She has no PCP. She has had hormonal workup in past and menopause has been ruled out as a cause of night sweats.  The following portions of the patient's history were reviewed and updated as appropriate: allergies, current medications, past family history, past medical history, past social history, past surgical history and problem list.  Review of Systems:  Pertinent items are noted in HPI.  Objective:  Physical Exam BP 112/78  Pulse 81  Temp(Src) 98.1 F (36.7 C)  Ht 5\' 1"  (1.549 m)  Wt 159 lb 9.6 oz (72.394 kg)  BMI 30.17 kg/m2  LMP 06/05/2013 GENERAL: Well-developed, well-nourished female in no acute distress.  HEENT: Normocephalic, atraumatic.  NECK: Supple. Normal thyroid.  LUNGS: Normal rate. Clear to auscultation bilaterally.  HEART: Regular rate and rhythm with no adventitious sounds.  EXTREMITIES: No cyanosis, clubbing, or edema, 2+ distal pulses.   Labs and Imaging No results found.  Assessment & Plan:  Assessment:  Chronic night sweats SOB x 1 year Exposure to TB  Plans: PPD today to be read on Monday CXR Monday Will refer to [facility] after results return  Olegario Messier, NP 06/26/2013 10:18 AM

## 2013-06-26 NOTE — Progress Notes (Signed)
Ppd placed right inner forearm.  Patient will come 06/29/13 to be read before or after cxr.

## 2013-06-26 NOTE — Progress Notes (Signed)
Here for c/o night sweats/ cramping/ vaginal discharge/ urinary urgency/frequency.   Also needs a pap.

## 2013-06-26 NOTE — Patient Instructions (Signed)
Tuberculosis Tuberculosis (TB) is a serious infection that lasts for years if it is not treated. TB usually attacks the lungs, but almost any part of the body can be affected. TB can be cured with medicines. If TB is not treated completely, it damages the lungs and other parts of the body and may be life-threatening. Caregivers are required by law to report all cases of TB to the Department of Health. The Department of Healthhelps to identify other people who have TB and to protect other people from getting TB. CAUSES  TB is caused by a bacteria called Mycobacterium tuberculosis. It is easily spread from person to person (contagious). This can occur when an infected person coughs or sneezes, releasing tiny droplets into the air. Another person can then breathe the bacteria into the lungs, causing infection. SYMPTOMS   Cough.  Weight loss.  Fatigue.  Fever.  Sweating.  Chills.  Loss of appetite. DIAGNOSIS  Your caregiver may perform a skin test. A substance is injected under the skin, and your caregiver will recheck the area in 48 to 72 hours to see how your body reacts. Your caregiver may also use blood tests, chest X-rays, and sputum tests to determine whether you have TB. TREATMENT  TB is treated with antibiotic medicines. You may need to take antibiotics for 6 to 9 months. Antibiotics commonly given for TB include:  Isoniazid.  Rifampin.  Ethambutol.  Pyrazinamide. HOME CARE INSTRUCTIONS   Take your antibiotics as directed. Finish them even if you start to feel better.  Keep all follow-up appointments as directed by your caregiver. Regular follow-up visits are required to make sure your medicines are working. Follow-up visits are needed for at least 2 years to make sure the illness remains under control.  Tell your caregiver about all of the people you live with or have close contact with. Your caregiver or the Department of Health will contact these people about also being  tested for TB.  Eat a well-balanced diet.  Rest as needed.  Until your caregiver says you are no longer contagious:  Avoid close contact with others, especially babies and elderly people. They are much more likely to catch TB.  Cover your mouth and nose when you cough or sneeze. Dispose of used tissues properly.  Wash your hands frequently with soap and water.  Do not go back to work or school. SEEK MEDICAL CARE IF:   You have new problems that may be caused by your medicine.  You lose your appetite, feel nauseous, or vomit.  Your urine becomes dark yellow.  Your skin or the white part of your eyes turns a yellowish color.  Your symptoms do not go away or get worse.  You have a new cough, or your cough lasts longer than 3 to 4 weeks.  You keep losing weight.  The patient is a baby older than 3 months with a rectal temperature of 100.5 F (38.1 C) or higher for more than 1 day. SEEK IMMEDIATE MEDICAL CARE IF:   You have chest pain or cough up blood.  You have trouble breathing or shortness of breath.  You have a headache or neck stiffness.  You have a fever.  The patient is a baby older than 3 months with a rectal temperature of 102 F (38.9 C) or higher.  The patient is a baby 3 months old or younger with a rectal temperature of 100.4 F (38 C) or higher. MAKE SURE YOU:  Understand these instructions.    Will watch your condition.  Will get help right away if you are not doing well or get worse. Document Released: 12/16/1999 Document Revised: 03/12/2011 Document Reviewed: 08/17/2010 [facility] Patient Information 2015 [place], [place]. This information is not intended to replace advice given to you by your health care provider. Make sure you discuss any questions you have with your health care provider. TB (Tuberculosis) Tine Test The tuberculin tine test is used to determine whether someone has come in contact with the bacteria that cause a disease called  tuberculosis. HOW THE TEST IS DONE  This test uses a tiny spiked instrument to inject a small amount of the tuberculosis dead protein material (antigen) just under your skin. This is most commonly done on the arm. Usually, the area is marked with an ink pen. That way it can be checked for any redness and swelling. It is usually checked in 2 to 3 days. Note: Another test, called the "tuberculin skin test" (also called a PPD), is more accurate than the TB tine test. It is the preferred method of determining exposure to tuberculosis. PREPARATION FOR TEST  There is no special preparation. People with a skin rash or other skin irritations on their arms may need to have the test performed at a different spot on the body. HOW THE TEST WILL FEEL  Some people feel a slight stinging sensation when the instrument is inserted under the skin. After the test, the area may itch or burn. MEANING OF THE TEST  This test helps determine if you have ever been exposed to a disease called tuberculosis. If so, your immune system produced antibodies to help fight the disease. These remain in your body. When this test is performed, those with antibodies to tuberculosis will have a positive test result. OBTAINING THE TESTRESULTS The area needs to be checked for any redness and swelling at a later time, usually in 2 to 3 days. Follow your caregiver's instructions as to where and when to report for this to be done. It is your responsibility to obtain your test results. Ask the lab or department performing the test when and how you will get your results. NORMAL FINDINGS  If you have a negative test result, the area may be a little red, but will not be swollen and firm like a mosquito bite. This means you have not been exposed to the bacteria that cause tuberculosis. WHAT ABNORMAL RESULTS MEAN  If you have been exposed to tuberculosis, the area will become red and swell like a mosquito bit in 48 to 72 hours. This  is considered a positive test result. It means your body's immune system detected the substance injected under your skin. A positive TB tine test does not mean that you have active tuberculosis. It only means that you have been exposed at some point in the past.  A chest x-ray may be taken to evaluate whether you have active tuberculosis. Once you have been exposed, all future TB tine tests will be positive. If you have a positive TB tine test, the Centers for Disease Control and Prevention (CDC) recommends that you have a TB skin test (PPD, see above), unless the first tine test had a blistering or very large reaction. RISKSAND COMPLICATIONS  The risk of severe side effects is very low. In the unlikely event that a side effect occurs, typical ones include itching and hives. Rarely, the area may blister, or the area of swelling may become very large. Tell your health care provider if  you have any severe reactions. CONSIDERATIONS  The test results may be incorrect (false negative). False negative means the test suggests you have not been exposed to tuberculosis, but you really have been. This is more likely in the elderly and in patients with weakened immune systems, such as:  AIDS patients  Cancer patients who receive chemotherapy  Those who receive organ transplants  Anyone taking high doses of prescription steroid medication Document Released: 06/04/2006 Document Revised: 03/12/2011 Document Reviewed: 07/01/2006 [facility] Patient Information 2015 [place], [place]. This information is not intended to replace advice given to you by your health care provider. Make sure you discuss any questions you have with your health care provider.

## 2013-06-29 ENCOUNTER — Encounter: Payer: Self-pay | Admitting: *Deleted

## 2013-06-29 ENCOUNTER — Ambulatory Visit: Payer: Self-pay

## 2013-06-29 ENCOUNTER — Ambulatory Visit (HOSPITAL_COMMUNITY): Payer: No Typology Code available for payment source

## 2013-06-29 ENCOUNTER — Ambulatory Visit (HOSPITAL_COMMUNITY)
Admission: RE | Admit: 2013-06-29 | Discharge: 2013-06-29 | Disposition: A | Discharge status: Home / Self Care | Payer: Medicaid Other | Source: Ambulatory Visit | Attending: Nurse Practitioner | Admitting: Nurse Practitioner

## 2013-06-29 DIAGNOSIS — R61 Generalized hyperhidrosis: Secondary | ICD-10-CM | POA: Insufficient documentation

## 2013-06-29 DIAGNOSIS — Z201 Contact with and (suspected) exposure to tuberculosis: Secondary | ICD-10-CM | POA: Insufficient documentation

## 2013-06-29 NOTE — Progress Notes (Signed)
PPD negative

## 2013-06-29 NOTE — Addendum Note (Signed)
Addended by: Samuel Germany on: 06/29/2013 12:57 PM   Modules accepted: Orders

## 2013-07-01 ENCOUNTER — Telehealth: Payer: Self-pay | Admitting: General Practice

## 2013-07-01 NOTE — Telephone Encounter (Signed)
Message copied by Shelly Coss on Wed Jul 01, 2013  4:44 PM ------      Message from: BAREFOOT, LINDA M      Created: Wed Jul 01, 2013  8:35 AM       Sorry! Also did she get her PPD read? Vaughan Basta ------

## 2013-07-01 NOTE — Telephone Encounter (Signed)
Patient needs to be informed of negative chest xray. Called patient, no answer- left message that we are trying to reach you, please call us back at the clinics

## 2013-07-02 ENCOUNTER — Telehealth: Payer: Self-pay | Admitting: Internal Medicine

## 2013-07-02 NOTE — Telephone Encounter (Signed)
Pt calling for a amphetamine-dextroamphetamine (ADDERALL) 20 MG tablet refill.  Says she has been waiting for a return call since Monday, 06/29/13. Please f/u with pt.

## 2013-07-02 NOTE — Telephone Encounter (Signed)
Message copied by Mitchell Heir on Thu Jul 02, 2013  9:25 AM ------      Message from: BAREFOOT, LINDA M      Created: Wed Jul 01, 2013  6:47 PM       Can we refer her to PCP for evaluation of night sweats. It does not seem to be hormonal issue. Thank You, Vaughan Basta ------

## 2013-07-02 NOTE — Telephone Encounter (Signed)
Patient called requesting a refill on her adderall i explained to her Per Chari Manning NP that is not Something we prescribe Gave her information on [facility] to follow  Up with them. Patient states since she has no insurance she can not Afford to pay her counselor the 195.00 fee

## 2013-07-02 NOTE — Telephone Encounter (Signed)
Patient called back. Informed her of results. Referral form sent to [facility] for patient.

## 2013-07-06 ENCOUNTER — Telehealth: Payer: Self-pay | Admitting: *Deleted

## 2013-07-06 NOTE — Telephone Encounter (Signed)
At last visit patient was referred to [facility]. [facility] faxed back and stated that patient is already established with [facility]. Patient needs to be informed to go there for her primary care, she doesn't need to be referred she can just call to make the appointment.   Called patient and left message to call us back.

## 2013-07-06 NOTE — Telephone Encounter (Signed)
Called patient and left her a detailed message that [facility] needs [facility] appointment for [facility]cp.

## 2013-07-21 ENCOUNTER — Telehealth: Payer: Self-pay | Admitting: *Deleted

## 2013-07-21 ENCOUNTER — Ambulatory Visit (INDEPENDENT_AMBULATORY_CARE_PROVIDER_SITE_OTHER): Payer: PRIVATE HEALTH INSURANCE

## 2013-07-21 ENCOUNTER — Ambulatory Visit (INDEPENDENT_AMBULATORY_CARE_PROVIDER_SITE_OTHER): Payer: PRIVATE HEALTH INSURANCE | Admitting: Podiatry

## 2013-07-21 VITALS — BP 110/65 | HR 81 | Resp 15

## 2013-07-21 DIAGNOSIS — M2040 Other hammer toe(s) (acquired), unspecified foot: Secondary | ICD-10-CM

## 2013-07-21 NOTE — Progress Notes (Signed)
   Subjective:    Patient ID: Melissa [name], female    DOB: [birth year], 38 y.o.   MRN: [id number]  HPI Comments: Pt presents for post-op x-rays.  Pt's B/L surgical 4, 5th toes are without drainage, has minimal swelling.  Pt states occasionally wears the Coflex for compression.  I encouraged the pt to wear the Coflex daily, and she would see improvement in the swelling.  I informed pt that Dr. Blenda Mounts would review the x-rays and let us know what was next for her post-op recovery.     Review of Systems     Objective:   Physical Exam        Assessment & Plan:

## 2013-07-21 NOTE — Telephone Encounter (Signed)
Pt presented for post-op check and was x-rayed.  I informed pt the x-rays would be reviewed by Dr. Blenda Mounts and we would call pt with instructions.

## 2013-07-28 ENCOUNTER — Telehealth: Payer: Self-pay | Admitting: *Deleted

## 2013-07-28 NOTE — Telephone Encounter (Signed)
I called the patient and informed her that Dr. Blenda Mounts wants her to come in for a consultation about a pin removal.  She said so he does want to take it out.  I told her it's to discuss the possibility of doing it.  She stated okay.  I transferred her to a scheduler.

## 2013-07-28 NOTE — Telephone Encounter (Signed)
X-ray were reviewed there appears to be good consolidation of the hammertoe repair site and as previously discussed options can remove as long as is the more than 3 months since surgery recommend set up appointment for surgery consult for pin removal/removal at her convenience

## 2013-08-11 ENCOUNTER — Ambulatory Visit (INDEPENDENT_AMBULATORY_CARE_PROVIDER_SITE_OTHER): Payer: PRIVATE HEALTH INSURANCE

## 2013-08-11 VITALS — BP 110/68 | HR 64 | Resp 17

## 2013-08-11 DIAGNOSIS — M79609 Pain in unspecified limb: Secondary | ICD-10-CM

## 2013-08-11 DIAGNOSIS — Z472 Encounter for removal of internal fixation device: Secondary | ICD-10-CM

## 2013-08-11 DIAGNOSIS — M79606 Pain in leg, unspecified: Secondary | ICD-10-CM

## 2013-08-11 DIAGNOSIS — Z9889 Other specified postprocedural states: Secondary | ICD-10-CM

## 2013-08-11 NOTE — Patient Instructions (Signed)
Pre-Operative Instructions  Congratulations, you have decided to take an important step to improving your quality of life.  You can be assured that the doctors of [facility] will be with you every step of the way.  1. Plan to be at the surgery center/hospital at least 1 (one) hour prior to your scheduled time unless otherwise directed by the surgical center/hospital staff.  You must have a responsible adult accompany you, remain during the surgery and drive you home.  Make sure you have directions to the surgical center/hospital and know how to get there on time. 2. For hospital based surgery you will need to obtain a history and physical form from your family physician within 1 month prior to the date of surgery- we will give you a form for you primary physician.  3. We make every effort to accommodate the date you request for surgery.  There are however, times where surgery dates or times have to be moved.  We will contact you as soon as possible if a change in schedule is required.   4. No Aspirin/Ibuprofen for one week before surgery.  If you are on aspirin, any non-steroidal anti-inflammatory medications (Mobic, Aleve, Ibuprofen) you should stop taking it 7 days prior to your surgery.  You make take Tylenol  For pain prior to surgery.  5. Medications- If you are taking daily heart and blood pressure medications, seizure, reflux, allergy, asthma, anxiety, pain or diabetes medications, make sure the surgery center/hospital is aware before the day of surgery so they may notify you which medications to take or avoid the day of surgery. 6. No food or drink after midnight the night before surgery unless directed otherwise by surgical center/hospital staff. 7. No alcoholic beverages 24 hours prior to surgery.  No smoking 24 hours prior to or 24 hours after surgery. 8. Wear loose pants or shorts- loose enough to fit over bandages, boots, and casts. 9. No slip on shoes, sneakers are best. 10. Bring  your boot with you to the surgery center/hospital.  Also bring crutches or a walker if your physician has prescribed it for you.  If you do not have this equipment, it will be provided for you after surgery. 11. If you have not been contracted by the surgery center/hospital by the day before your surgery, call to confirm the date and time of your surgery. 12. Leave-time from work may vary depending on the type of surgery you have.  Appropriate arrangements should be made prior to surgery with your employer. 13. Prescriptions will be provided immediately following surgery by your doctor.  Have these filled as soon as possible after surgery and take the medication as directed. 14. Remove nail polish on the operative foot. 15. Wash the night before surgery.  The night before surgery wash the foot and leg well with the antibacterial soap provided and water paying special attention to beneath the toenails and in between the toes.  Rinse thoroughly with water and dry well with a towel.  Perform this wash unless told not to do so by your physician.  Enclosed: 1 Ice pack (please put in freezer the night before surgery)   1 Hibiclens skin cleaner   Pre-op Instructions  If you have any questions regarding the instructions, do not hesitate to call our office.  [place]: [address] [place], [place] [address] [phone]  [place]: [address] [place], [place] [address] [phone]  [place]: [address]  [place],  [address] [phone]  Dr. Rebel Laughridge   Tuchman DPM, Dr. Norman Regal DPM Dr. Margerite Impastato DPM, Dr. M. Todd Hyatt DPM, Dr. Kathryn Egerton DPM 

## 2013-08-11 NOTE — Progress Notes (Signed)
   Subjective:    Patient ID: Melissa [name], female    DOB: 07-Aug-1975, 38 y.o.   MRN: [id number]  HPI Pt presents to discuss pin removal   Review of Systems no new findings or systemic changes noted     Objective:   Physical Exam Neurovascular status is intact pedal pulses are palpable epicritic and proprioceptive sensations intact and symmetric. Patient is status post hammertoe repair 4 and 5 bilateral the digits. He clinically good rectus position x-rays reveal screws intact fourth digits proximal and distal IP joints fused at this time my recommendations for pin removal treated previously plantar discussed with him the fifth toe right and may actually about of the cortex the phalanx in a slightly loose although the digits in a rectus position. She does have some of the postoperative edema and tenderness on palpation of the fourth toes and would benefit from removal fixation at this time. Risk L. toes are reviewed with the patient questions asked are answered.      Assessment & Plan:  Assessment this time patient is 3 months plus status post hammertoe repair fourth digits bilateral with screw fixations the screws are ready for removal at this time there no contraindications consent form is reviewed and signed for screw removal to be done in the office on an outpatient basis local anesthetic block. Surgery consent forms are reviewed all questions asked medication are answered surgery scheduled her to knees with appropriate followup thereafter Darco shoe for possible to 2 weeks ambulatory immediately postop. Again procedure to be done under local anesthetic block possible option for by mouth sedation such as Valium for ear prior surgery nails the often the patient this time. Surgery scheduled for K wire removal or screw removal fifth fourth toes bilateral next  Harriet Masson DPM

## 2013-08-25 ENCOUNTER — Ambulatory Visit (INDEPENDENT_AMBULATORY_CARE_PROVIDER_SITE_OTHER): Payer: PRIVATE HEALTH INSURANCE

## 2013-08-25 ENCOUNTER — Ambulatory Visit: Payer: Self-pay

## 2013-08-25 VITALS — BP 116/79 | HR 92 | Resp 15

## 2013-08-25 DIAGNOSIS — Z472 Encounter for removal of internal fixation device: Secondary | ICD-10-CM

## 2013-08-25 DIAGNOSIS — M79676 Pain in unspecified toe(s): Secondary | ICD-10-CM

## 2013-08-25 DIAGNOSIS — M2040 Other hammer toe(s) (acquired), unspecified foot: Secondary | ICD-10-CM

## 2013-08-25 DIAGNOSIS — M79609 Pain in unspecified limb: Secondary | ICD-10-CM

## 2013-08-25 MED ORDER — CEPHALEXIN 500 MG PO CAPS: 500.0000 mg | ORAL_CAPSULE | Freq: Four times a day (QID) | ORAL | Status: DC

## 2013-08-25 MED ORDER — HYDROCODONE-ACETAMINOPHEN 5-325 MG PO TABS: 1.0000 | ORAL_TABLET | ORAL | Status: DC | PRN

## 2013-08-25 NOTE — Progress Notes (Signed)
Patient ID: Melissa [name], female   DOB: [birth year], 38 y.o.   MRN: [id number] Operative note:  Preoperative diagnosis:painful fixation fourth digits bilateral. Status post hammertoe arthrodesis fourth digits bilateral  Postoperative diagnosis: Same  Procedure: After care removal of painful fixation fourth toes bilateral, deep screw fixation removed fourth toes bilateral  Anesthesia: Local anesthetic block administered total of 3 cc 50-50 mixture of 2% Xylocaine plain and 0.5% Marcaine plain in a digital block fashion fourth toes bilateral  Hemostasis : Bilateral ankle tourniquets at 250 mm mercury  Estimated blood loss: Minimal, less than 1 cc.  Indications for surgery: Patient is more than 3 months status post hammertoe repair with arthrodesis fourth digits bilateral with screw fixations. Continues to have pain discomfort and sensitivity passivity distal tuft of the digits Were screwed it is noted should have adequate time for arthrodesis versus fibrosis of the previously hammertoe digital contractures.  Findings and procedure: Removal of painful screw fixation fourth digits bilateral. Patient was brought into your placed on the table in the supine position with local anesthetic administered in the treatment room. The foot was prepped in the usual aseptic manner with Betadine prep ankle tourniquet was placed above the malleoli and padded well to prevent contusion. Attention was now directed after standard surgical draping the left foot was exam the Ace wrap and ankle tourniquet inflated to 250 mm mercury following procedure was then carried out  Procedure #1Screw removal fourth toe left foot: Attention was directed to the distal aspect of the fourth digit left foot approximately 1 cm stab incision was made in a transverse fashion from medial to lateral at the distal tuft. The incision deepened via sharp dissection to the distal tuft of the phalanx where a metallic object was felt and the  distal tip of the screw was identified within the site. Utilizing a Freer the screw head was freed from surrounding and overlying soft tissue and a screwdriver was introduced identifying appropriate size a Phillips type small screwdriver head was utilized and the screw was retrograded from the toe completely with minimal discomfort. Once removed the toe was noted to be relatively stable in its corrected position. The site was lavaged response of Kenalog solution and cleared of all soft tissue debris at this time a single 5-0 nylon suture was placed to reapproximate the skin a distal tuft of the fourth digit. The site was also infiltrated with half cc of dexamethasone phosphate Betadine and a dry sterile compressive dressing applied to the left fourth toe and left forefoot. Ankle tourniquet deflated with immediate return of perfusion to all the adjacent digits.  Procedure #2: Screw removal fourth toe right foot Attention was now directed to the right foot which was also exsanguinated with Ace wrap and tourniquet inflated to 2 directed to the distal tuft of the fourth digit where the exact same procedures performed on the left was repeated in a similar fashion. Should note there was some more difficulty in removing the screw on the right foot as it was somewhat buried in the distal phalanx however eventually was removed without difficulty the digit was noted to be in a relatively rectus position although not completely arthrodesed it was certainly fibrosed in a stable position. The site was also lavaged response is to about solution infiltrated with half cc dexamethasone phosphate. The distal incision was closed with 2 simple 5-0 nylon sutures. At this time Betadine and redressed to compressive dressings applied to the right forefoot. Ankle tourniquet deflated with immediate return  of perfusion to all digits of the right foot as well.  Following completion of both procedures patient returned from your to  treatment room, oral written postop instructions were reviewed prescriptions for pain antibiotic medications were dispensed and applied for followup ulcers is given for one week from today's date. Patient maintain moderate walking activities and ambulation leave Darco shoes in place and redressing undisturbed at this time.  Harriet Masson DPM

## 2013-08-25 NOTE — Patient Instructions (Signed)
ICE INSTRUCTIONS  Apply ice or cold pack to the affected area at least 3 times a day for 10-15 minutes each time.  You should also use ice after prolonged activity or vigorous exercise.  Do not apply ice longer than 20 minutes at one time.  Always keep a cloth between your skin and the ice pack to prevent burns.  Being consistent and following these instructions will help control your symptoms.  We suggest you purchase a gel ice pack because they are reusable and do bit leak.  Some of them are designed to wrap around the area.  Use the method that works best for you.  Here are some other suggestions for icing.   Use a frozen bag of peas or corn-inexpensive and molds well to your body, usually stays frozen for 10 to 20 minutes.  Wet a towel with cold water and squeeze out the excess until it's damp.  Place in a bag in the freezer for 20 minutes. Then remove and use.   Use ice several times a day to help with any throbbing or aching pain and swelling keep foot elevated when possible. Maintain Darco shoe and surgical dressings in place to not disturb. Follow written postoperative instructions.  Reappointed one week following surgery for her first postop dressing change

## 2013-08-25 NOTE — Progress Notes (Signed)
   Subjective:    Patient ID: Melissa [name], female    DOB: August 14, 1975, 38 y.o.   MRN: [id number]  HPI Comments: Pt states she is here for screw removal from B/L 4th toes.  Pt states the left 4th toe is painful with the screw but the right is not and wanted to discuss this with Dr. Blenda Mounts,     Review of Systems no new findings or systemic changes noted     Objective:   Physical Exam Neurovascular status is intact patient was seen in the preop area where local anesthetic block was administered and any final questions were discussed. Patient ready for surgery for removal of painful screw fixations fourth toes bilateral. Has more than 3 months since surgery for correction of hammertoes. Should adequate time for thorough arthrodesis or fibrosis within a rectus position.       Assessment & Plan:  Assessment aftercare removal of painful fixation take place at this time today's visit patient underwent the surgery at this time with minimal discomfort under local anesthetic block was discharged care of her sister with postoperative pain antibiotic medications and appointment for followup pulses are within 1 week  Harriet Masson DP

## 2013-09-01 ENCOUNTER — Ambulatory Visit (INDEPENDENT_AMBULATORY_CARE_PROVIDER_SITE_OTHER): Payer: PRIVATE HEALTH INSURANCE

## 2013-09-01 VITALS — BP 110/64 | HR 74 | Resp 17

## 2013-09-01 DIAGNOSIS — Z9889 Other specified postprocedural states: Secondary | ICD-10-CM

## 2013-09-01 DIAGNOSIS — Z472 Encounter for removal of internal fixation device: Secondary | ICD-10-CM

## 2013-09-01 DIAGNOSIS — M2040 Other hammer toe(s) (acquired), unspecified foot: Secondary | ICD-10-CM

## 2013-09-01 DIAGNOSIS — Z09 Encounter for follow-up examination after completed treatment for conditions other than malignant neoplasm: Secondary | ICD-10-CM

## 2013-09-01 NOTE — Patient Instructions (Signed)
ICE INSTRUCTIONS  Apply ice or cold pack to the affected area at least 3 times a day for 10-15 minutes each time.  You should also use ice after prolonged activity or vigorous exercise.  Do not apply ice longer than 20 minutes at one time.  Always keep a cloth between your skin and the ice pack to prevent burns.  Being consistent and following these instructions will help control your symptoms.  We suggest you purchase a gel ice pack because they are reusable and do bit leak.  Some of them are designed to wrap around the area.  Use the method that works best for you.  Here are some other suggestions for icing.   Use a frozen bag of peas or corn-inexpensive and molds well to your body, usually stays frozen for 10 to 20 minutes.  Wet a towel with cold water and squeeze out the excess until it's damp.  Place in a bag in the freezer for 20 minutes. Then remove and use.  Maintain ice if there is any aching or throbbing or swelling in the toe also maintain the Coflex buddy wrap for one to 2 weeks following suture removal.  May resume normal bathing and showers. Maintain a comfortable loosefitting shoe

## 2013-09-01 NOTE — Progress Notes (Signed)
   Subjective:    Patient ID: Melissa [name], female    DOB: April 18, 1975, 38 y.o.   MRN: [id number]  HPI  Pt presents for post op follow up. States that she is doing well  Review of Systems no new findings or systemic changes noted     Objective:   Physical Exam Neurovascular status is intact pedal pulses are palpable dressings are removed clean and dry sutures are removed removed at this time Neosporin and Band-Aid and Coflex wrap in the toes carried out both fourth toes. Good clinical and radiographic position following screw removal good alignment following hammertoe repairs with some instability in the past.       Assessment & Plan:  Assessment good postop progress following hammertoe repair and subsequent is screw removal maintain Coflex wrap in for about 2 weeks duration recommended ice as needed for pain for swelling buddy wrap to as instructed no dress shoes heels or tight shoes released a to 4 weeks recheck in one month for long-term followup  Harriet Masson DPM

## 2013-09-03 NOTE — Progress Notes (Signed)
Dr Blenda Mounts performed a removal of screws in bilateral feet 4th met on 08/25/13

## 2013-12-01 ENCOUNTER — Ambulatory Visit (INDEPENDENT_AMBULATORY_CARE_PROVIDER_SITE_OTHER): Payer: PRIVATE HEALTH INSURANCE

## 2013-12-01 VITALS — BP 111/65 | HR 81 | Resp 18

## 2013-12-01 DIAGNOSIS — Z09 Encounter for follow-up examination after completed treatment for conditions other than malignant neoplasm: Secondary | ICD-10-CM

## 2013-12-01 DIAGNOSIS — M79676 Pain in unspecified toe(s): Secondary | ICD-10-CM

## 2013-12-01 DIAGNOSIS — R609 Edema, unspecified: Secondary | ICD-10-CM

## 2013-12-01 NOTE — Patient Instructions (Signed)
ICE INSTRUCTIONS  Apply ice or cold pack to the affected area at least 3 times a day for 10-15 minutes each time.  You should also use ice after prolonged activity or vigorous exercise.  Do not apply ice longer than 20 minutes at one time.  Always keep a cloth between your skin and the ice pack to prevent burns.  Being consistent and following these instructions will help control your symptoms.  We suggest you purchase a gel ice pack because they are reusable and do bit leak.  Some of them are designed to wrap around the area.  Use the method that works best for you.  Here are some other suggestions for icing.   Use a frozen bag of peas or corn-inexpensive and molds well to your body, usually stays frozen for 10 to 20 minutes.  Wet a towel with cold water and squeeze out the excess until it's damp.  Place in a bag in the freezer for 20 minutes. Then remove and use.  Maintain  Wider shoes

## 2013-12-01 NOTE — Progress Notes (Signed)
   Subjective:    Patient ID: YSIDRA SOPHER, female    DOB: 05-20-1975, 38 y.o.   MRN: [id number]  HPI my 5th toes still have an achy feeling and has sharp pain and the right is the worst and does swell and get numb and tingles some and the left foot I can wear a shoe but on the right foot the shoe doesn't fit well    Review of Systemsno new findings or systemic  Changes      Objective:   Physical Exam 38 year old Hispanic female persists at this time for follow-up she is approximate 56 months postop hammertoe repairs fourth and fifth toes bilateral the left foot is doing well with a minimal or no pain over the right foot still having a little more swelling on the left. The fourth and fifth toes second third toes are laterally more displacement likely causing some irritation patient is currently wearing shoes Randall Hiss she too narrow and curved last for her foot the shoes at least half inch to narrow her foot is demonstrated to the patient she is to wear wider straight shoes and try to do in the future. At this time x-rays AP lateral oblique views reveal consolidated osteotomies are healed hammertoe repairs. The wounds are well coapted there is some mild edema consistent with postop course patient advised swelling can last for 6 months or longer following hammertoe procedures particular multiple procedures.       Assessment & Plan:  Assessment good postop progress still some residual edema noted patient is advised to apply warm compress ice pack and maintain a straight wider shoe avoid anything tight or constrictive suggested a six-month long-term follow-up open on anti-most the swelling is going down her subsided significantly any changes or exacerbations in the interim. Next  Harriet Masson DPM

## 2013-12-08 ENCOUNTER — Ambulatory Visit: Payer: Self-pay

## 2013-12-08 ENCOUNTER — Ambulatory Visit (INDEPENDENT_AMBULATORY_CARE_PROVIDER_SITE_OTHER): Payer: PRIVATE HEALTH INSURANCE

## 2013-12-08 VITALS — BP 134/86 | HR 64 | Resp 12

## 2013-12-08 DIAGNOSIS — M79676 Pain in unspecified toe(s): Secondary | ICD-10-CM

## 2013-12-08 DIAGNOSIS — L03012 Cellulitis of left finger: Secondary | ICD-10-CM

## 2013-12-08 DIAGNOSIS — Z9889 Other specified postprocedural states: Secondary | ICD-10-CM

## 2013-12-08 DIAGNOSIS — M79673 Pain in unspecified foot: Secondary | ICD-10-CM

## 2013-12-08 DIAGNOSIS — M7751 Other enthesopathy of right foot: Secondary | ICD-10-CM

## 2013-12-08 DIAGNOSIS — L600 Ingrowing nail: Secondary | ICD-10-CM

## 2013-12-08 MED ORDER — CEPHALEXIN 500 MG PO CAPS: 500.0000 mg | ORAL_CAPSULE | Freq: Four times a day (QID) | ORAL | Status: DC

## 2013-12-08 MED ORDER — TRIAMCINOLONE ACETONIDE 10 MG/ML IJ SUSP: 10.0000 mg | Freq: Once | INTRAMUSCULAR | Status: DC

## 2013-12-08 NOTE — Patient Instructions (Signed)
tfc ICE INSTRUCTIONS  Apply ice or cold pack to the affected area at least 3 times a day for 10-15 minutes each time.  You should also use ice after prolonged activity or vigorous exercise.  Do not apply ice longer than 20 minutes at one time.  Always keep a cloth between your skin and the ice pack to prevent burns.  Being consistent and following these instructions will help control your symptoms.  We suggest you purchase a gel ice pack because they are reusable and do bit leak.  Some of them are designed to wrap around the area.  Use the method that works best for you.  Here are some other suggestions for icing.   Use a frozen bag of peas or corn-inexpensive and molds well to your body, usually stays frozen for 10 to 20 minutes.  Wet a towel with cold water and squeeze out the excess until it's damp.  Place in a bag in the freezer for 20 minutes. Then remove and use.     Betadine Soak Instructions  Purchase an 8 oz. bottle of BETADINE solution (Povidone)  THE DAY AFTER THE PROCEDURE  Place 1 tablespoon of betadine solution in a quart of warm tap water.  Submerge your foot or feet with outer bandage intact for the initial soak; this will allow the bandage to become moist and wet for easy lift off.  Once you remove your bandage, continue to soak in the solution for 20 minutes.  This soak should be done twice a day.  Next, remove your foot or feet from solution, blot dry the affected area and cover.  You may use a band aid large enough to cover the area or use gauze and tape.  Apply other medications to the area as directed by the doctor such as cortisporin otic solution (ear drops) or neosporin.  IF YOUR SKIN BECOMES IRRITATED WHILE USING THESE INSTRUCTIONS, IT IS OKAY TO SWITCH TO EPSOM SALTS AND WATER OR WHITE VINEGAR AND WATER.   For postoperative pain recommended Tylenol or Advil, or Aleve as needed for pain

## 2013-12-08 NOTE — Progress Notes (Signed)
   Subjective:    Patient ID: JENNELLE PINKSTAFF, female    DOB: 1975/02/08, 38 y.o.   MRN: [id number]  HPI   removal of screws in bilateral feet 4th met on 08/25/13.''RT 5TH TOE STILL PAINFUL AND LT FOOT GREAT TOENAIL STILL LOOKS THE SAME SINCE April.''  Review of Systems no new findings or systemic changes noted     Objective:   Physical Exam 38 year old female since this time with 2 major concerns continues to have pain of the fifth toe right foot patient's had previous arthroplasty procedure or continues to have some swelling on the right as compared to the left with some prominence of the IP joint. Cannot rule out some mild arthropathy area with residual capsulitis it's aggravated with certain shoes walking activities. Her other concern is his a recurrence of an infection or ingrowing nail lateral border of the left great toe. There is some granulation and some mild serous drainage discharge or drainage noted with erythema the lateral nail border of the left hallux. Remainder the exam unremarkable neurovascular status is intact pedal pulses are palpable epicritic and proprioceptive sensations intact and symmetric bilateral there is normal plantar response DTRs not elicited dermatologically skin color pigment normal hair growth absent nail somewhat criptotic there is again some residual hammertoe deformity fifth toe right foot cannot rule out possibly regrowth of spicule hypertrophy of the phalanx head with some capsulitis and soft tissue swelling. At this time does have erythema and edema and granulation tissue lateral nail border of the left hallux and possibly a nail spicule regrowth of nail being identified. Exam otherwise unremarkable no other findings no other open wounds ulcerations or other complications noted       Assessment & Plan:  Assessment this time capsulitis fifth toe right foot with possibly postoperative edema and capsulitis and inflammation still present plan at this time  patient did request my recommendation steroid injection and at this time 500 Kenalog 20 mg Xylocaine plain infiltrated to the fifth toe MTP and IP joint area. Some tube foam padding and a Band-Aid are applied to the fifth toe right foot. Recommended ice and accommodative shoe and padding at all times. Next  Assessment number to is regrowth or recurrence of ingrowing nail paronychia lateral border left hallux this time local anesthetic blocks Mr. Betadine prep was performed the lateral border is reexcised a small portion of nail spicule is identified and some hypertrophic scar tissue along the nail border. At this time 3 applications of phenol followed by alcohol wash Silvadene cream and gauze dressing being applied. Daily Betadine warm water soaks prescription for cephalexin is ulceration at this time patient we recheck in 2-3 weeks for follow-up stressed the importance of the soaking and Neosporin and Band-Aid dressing daily recommend Tylenol as needed for pain or Advil or ibuprofen as alternative. Patient be recheck in 2-3 weeks for postop follow-up next  Harriet Masson DPM

## 2013-12-29 ENCOUNTER — Ambulatory Visit: Payer: PRIVATE HEALTH INSURANCE

## 2014-01-27 ENCOUNTER — Encounter: Payer: Self-pay | Admitting: Obstetrics & Gynecology

## 2014-01-27 ENCOUNTER — Ambulatory Visit (INDEPENDENT_AMBULATORY_CARE_PROVIDER_SITE_OTHER): Payer: Self-pay | Admitting: Obstetrics & Gynecology

## 2014-01-27 VITALS — BP 125/84 | HR 87 | Temp 98.6°F | Resp 20 | Ht 61.0 in | Wt 164.7 lb

## 2014-01-27 DIAGNOSIS — R358 Other polyuria: Secondary | ICD-10-CM

## 2014-01-27 DIAGNOSIS — R3589 Other polyuria: Secondary | ICD-10-CM

## 2014-01-27 LAB — POCT URINALYSIS DIP (DEVICE)
Bilirubin Urine: NEGATIVE
Glucose, UA: NEGATIVE mg/dL
Leukocytes, UA: NEGATIVE
Nitrite: NEGATIVE
Protein, ur: NEGATIVE mg/dL
Specific Gravity, Urine: >=1.03 (ref 1.005–1.030)
Urobilinogen, UA: 0.2 mg/dL (ref 0.0–1.0)
pH: 5.5 (ref 5.0–8.0)

## 2014-01-27 LAB — TSH: TSH: 0.821 u[IU]/mL (ref 0.350–4.500)

## 2014-01-27 LAB — HEMOGLOBIN A1C
Hgb A1c MFr Bld: 5.8 % — ABNORMAL HIGH (ref <5.7)
Mean Plasma Glucose: 120 mg/dL — ABNORMAL HIGH (ref <117)

## 2014-01-27 NOTE — Progress Notes (Signed)
   Subjective:    Patient ID: Melissa [name], female    DOB: April 09, 1975, 39 y.o.   MRN: [id number]  HPI  39 yo SH P0 here today with the complaint of polyuria, voids about more than 20 times per day, 4-5 times per night. She rarely drinks any liquids after 6 pm. She also of about 2 months of GSUI and urge incontinence. She denies enuresis.  Review of Systems She is trying to conceive.    Objective:   Physical Exam [id number] Breathing normally Neuro intact No prolapse       Assessment & Plan:  Polyuria- check hba1c, TSH, ua, uc&s Mixed incontinence- urology referal Rec MVI daily to help prevent ONTDs

## 2014-01-27 NOTE — Progress Notes (Signed)
Urology referral ordered- called [facility]- notified since she has orange card that she has to be referred by her primary care provider- [place]. Will contact [place] to make the referral.

## 2014-01-27 NOTE — Progress Notes (Signed)
Patient ID: Melissa [name], female   DOB: 1975-04-11, 39 y.o.   MRN: [id number] Pt reports that she has urgency and frequency of urination x6 months as well as abdominal pain. The problem has become worse. Clean catch urine specimen obtained.

## 2014-01-28 ENCOUNTER — Telehealth: Payer: Self-pay | Admitting: *Deleted

## 2014-01-28 NOTE — Telephone Encounter (Signed)
Have Called [facility] and left another message for them to call me back.

## 2014-01-28 NOTE — Telephone Encounter (Signed)
-----   Message from Su Ley, RN sent at 01/27/2014  4:00 PM EST ----- Melissa [name] has a urology referral for stress incontinence. Has orange card, otherwise no insurance. Prefers [facility]- they state with orange card that her primary care provider ([place]) has to make the referral. I have called [facility] and left a voicemail for them to call us back regarding a referral for Melissa [name].

## 2014-01-28 NOTE — Telephone Encounter (Signed)
Called [facility] at [phone] and was told they would call me back re: appointment

## 2014-01-28 NOTE — Telephone Encounter (Deleted)
-----   Message from Su Ley, RN sent at 01/27/2014  4:00 PM EST ----- Melissa [name] has a urology referral for stress incontinence. Has orange card, otherwise no insurance. Prefers [facility]- they state with orange card that her primary care provider ([facility]) has to make the referral. I have called [facility] and left a voicemail for them to call us back regarding a referral for Melissa [name].

## 2014-01-29 ENCOUNTER — Other Ambulatory Visit: Payer: Self-pay | Admitting: Internal Medicine

## 2014-01-29 DIAGNOSIS — N393 Stress incontinence (female) (male): Secondary | ICD-10-CM

## 2014-01-29 LAB — URINE CULTURE: Colony Count: 4000

## 2014-01-29 NOTE — Telephone Encounter (Signed)
Called [facility] and spoke with Alinda Sierras, referral coordinator re: need for them to send urology referral to [facility].  She states per chart review [facility] had ordered referral to [facility] last year but patient did not go. They will follow up on sending another Urology referral- it will depend on whether she has gotten her orange card updated.    Called [facility] to notify her [facility] will be sending referral  .  Left message I was calling to discuss follow up from her visit earlier this week,please call back Monday.

## 2014-02-03 NOTE — Telephone Encounter (Signed)
Called Melissa [name] to notify her [facility] will make referral to [facility] and she states they have already called her and made referral.

## 2014-02-10 ENCOUNTER — Ambulatory Visit: Payer: Self-pay | Admitting: Internal Medicine

## 2014-02-19 ENCOUNTER — Ambulatory Visit: Payer: Self-pay | Attending: Internal Medicine | Admitting: Internal Medicine

## 2014-02-19 ENCOUNTER — Encounter: Payer: Self-pay | Admitting: Internal Medicine

## 2014-02-19 VITALS — BP 136/89 | HR 98 | Temp 98.7°F | Resp 16 | Ht 61.0 in | Wt 161.0 lb

## 2014-02-19 DIAGNOSIS — Z0000 Encounter for general adult medical examination without abnormal findings: Secondary | ICD-10-CM | POA: Insufficient documentation

## 2014-02-19 DIAGNOSIS — Z803 Family history of malignant neoplasm of breast: Secondary | ICD-10-CM | POA: Insufficient documentation

## 2014-02-19 DIAGNOSIS — M545 Low back pain, unspecified: Secondary | ICD-10-CM

## 2014-02-19 DIAGNOSIS — N760 Acute vaginitis: Secondary | ICD-10-CM | POA: Insufficient documentation

## 2014-02-19 DIAGNOSIS — R61 Generalized hyperhidrosis: Secondary | ICD-10-CM | POA: Insufficient documentation

## 2014-02-19 LAB — LIPID PANEL
Cholesterol: 176 mg/dL (ref 0–200)
HDL: 43 mg/dL (ref >39)
LDL Cholesterol: 120 mg/dL — ABNORMAL HIGH (ref 0–99)
Total CHOL/HDL Ratio: 4.1 Ratio
Triglycerides: 64 mg/dL (ref <150)
VLDL: 13 mg/dL (ref 0–40)

## 2014-02-19 LAB — CBC
HCT: 41.6 % (ref 36.0–46.0)
Hemoglobin: 14.1 g/dL (ref 12.0–15.0)
MCH: 30.2 pg (ref 26.0–34.0)
MCHC: 33.9 g/dL (ref 30.0–36.0)
MCV: 89.1 fL (ref 78.0–100.0)
MPV: 9.5 fL (ref 8.6–12.4)
Platelets: 358 10*3/uL (ref 150–400)
RBC: 4.67 MIL/uL (ref 3.87–5.11)
RDW: 13.9 % (ref 11.5–15.5)
WBC: 7.3 10*3/uL (ref 4.0–10.5)

## 2014-02-19 LAB — COMPLETE METABOLIC PANEL WITH GFR
ALT: 22 U/L (ref 0–35)
AST: 26 U/L (ref 0–37)
Albumin: 4.6 g/dL (ref 3.5–5.2)
Alkaline Phosphatase: 60 U/L (ref 39–117)
BUN: 11 mg/dL (ref 6–23)
CO2: 25 mEq/L (ref 19–32)
Calcium: 9.6 mg/dL (ref 8.4–10.5)
Chloride: 103 mEq/L (ref 96–112)
Creat: 0.67 mg/dL (ref 0.50–1.10)
GFR, Est African American: >89 mL/min
GFR, Est Non African American: >89 mL/min
Glucose, Bld: 76 mg/dL (ref 70–99)
Potassium: 4.9 mEq/L (ref 3.5–5.3)
Sodium: 140 mEq/L (ref 135–145)
Total Bilirubin: 1 mg/dL (ref 0.2–1.2)
Total Protein: 7.7 g/dL (ref 6.0–8.3)

## 2014-02-19 MED ORDER — CYCLOBENZAPRINE HCL 10 MG PO TABS: 10.0000 mg | ORAL_TABLET | Freq: Three times a day (TID) | ORAL | Status: DC | PRN

## 2014-02-19 MED ORDER — METRONIDAZOLE 500 MG PO TABS: 500.0000 mg | ORAL_TABLET | Freq: Two times a day (BID) | ORAL | Status: DC

## 2014-02-19 NOTE — Patient Instructions (Signed)
Bacterial Vaginosis Bacterial vaginosis is a vaginal infection that occurs when the normal balance of bacteria in the vagina is disrupted. It results from an overgrowth of certain bacteria. This is the most common vaginal infection in women of childbearing age. Treatment is important to prevent complications, especially in pregnant women, as it can cause a premature delivery. CAUSES  Bacterial vaginosis is caused by an increase in harmful bacteria that are normally present in smaller amounts in the vagina. Several different kinds of bacteria can cause bacterial vaginosis. However, the reason that the condition develops is not fully understood. RISK FACTORS Certain activities or behaviors can put you at an increased risk of developing bacterial vaginosis, including:  Having a new sex partner or multiple sex partners.  Douching.  Using an intrauterine device (IUD) for contraception. Women do not get bacterial vaginosis from toilet seats, bedding, swimming pools, or contact with objects around them. SIGNS AND SYMPTOMS  Some women with bacterial vaginosis have no signs or symptoms. Common symptoms include:  Grey vaginal discharge.  A fishlike odor with discharge, especially after sexual intercourse.  Itching or burning of the vagina and vulva.  Burning or pain with urination. DIAGNOSIS  Your health care provider will take a medical history and examine the vagina for signs of bacterial vaginosis. A sample of vaginal fluid may be taken. Your health care provider will look at this sample under a microscope to check for bacteria and abnormal cells. A vaginal pH test may also be done.  TREATMENT  Bacterial vaginosis may be treated with antibiotic medicines. These may be given in the form of a pill or a vaginal cream. A second round of antibiotics may be prescribed if the condition comes back after treatment.  HOME CARE INSTRUCTIONS   Only take over-the-counter or prescription medicines as  directed by your health care provider.  If antibiotic medicine was prescribed, take it as directed. Make sure you finish it even if you start to feel better.  Do not have sex until treatment is completed.  Tell all sexual partners that you have a vaginal infection. They should see their health care provider and be treated if they have problems, such as a mild rash or itching.  Practice safe sex by using condoms and only having one sex partner. SEEK MEDICAL CARE IF:   Your symptoms are not improving after 3 days of treatment.  You have increased discharge or pain.  You have a fever. MAKE SURE YOU:   Understand these instructions.  Will watch your condition.  Will get help right away if you are not doing well or get worse. FOR MORE INFORMATION  Centers for Disease Control and Prevention, Division of STD Prevention: www.cdc.gov/std American Sexual Health Association (ASHA): www.ashastd.org  Document Released: 12/18/2004 Document Revised: 10/08/2012 Document Reviewed: 07/30/2012 ExitCare Patient Information 2015 [employer]. This information is not intended to replace advice given to you by your health care provider. Make sure you discuss any questions you have with your health care provider.  

## 2014-02-19 NOTE — Progress Notes (Signed)
Pt is here today for a physical and a pap smear.

## 2014-02-19 NOTE — Progress Notes (Addendum)
Patient ID: Melissa SHINAULT, female   DOB: [birth year], 39 y.o.   MRN: [id number]  CC: annual physical   HPI: Melissa [name] is a 39 y.o. female here today for a follow up visit.  Patient has no past medical history.  She c/o of consistent night sweats for the past couple of years. She has no history of tobacco use, drug use, alcohol use, or weight loss. She states that she has had a vaginal odor and discharge for 2 months. The odor is stronger when she sweats. She has a urology appointment in April for urinary pain. She has normal periods monthly, no new sexual partners, breast tenderness, nipple discharge, or breast mass. She has low back pain and would like a refill of flexeril.   Patient has No headache, No chest pain, No abdominal pain - No Nausea, No new weakness tingling or numbness, No Cough - SOB.  No Known Allergies Past Medical History  Diagnosis Date  . Hammer toe 04/2013    bilateral 4th and 5th   . Ingrown left big toenail 04/2013  . UTI (lower urinary tract infection)    Current Outpatient Prescriptions on File Prior to Visit  Medication Sig Dispense Refill  . cholecalciferol (VITAMIN D) 1000 UNITS tablet Take 2,000 Units by mouth daily.    . Multiple Vitamin (MULTIVITAMIN) tablet Take 1 tablet by mouth daily.     Current Facility-Administered Medications on File Prior to Visit  Medication Dose Route Frequency Provider Last Rate Last Dose  . triamcinolone acetonide (KENALOG) 10 MG/ML injection 10 mg  10 mg Other Once Harriet Masson, DPM       Family History  Problem Relation Age of Onset  . Cancer Father   . Birth defects Paternal Grandfather    History   Social History  . Marital Status: Single    Spouse Name: N/A  . Number of Children: N/A  . Years of Education: N/A   Occupational History  . Not on file.   Social History Main Topics  . Smoking status: Never Smoker   . Smokeless tobacco: Never Used  . Alcohol Use: Yes     Comment: occasionally  . Drug  Use: No  . Sexual Activity: Yes    Birth Control/ Protection: None   Other Topics Concern  . Not on file   Social History Narrative    Review of Systems: See HPI    Objective:   Filed Vitals:   02/19/14 1204  BP: 136/89  Pulse: 98  Temp: 98.7 F (37.1 C)  Resp: 16    Physical Exam: Constitutional: Patient appears well-developed and well-nourished. No distress. HENT: Normocephalic, atraumatic, External right and left ear normal. Oropharynx is clear and moist.  Eyes: Conjunctivae and EOM are normal. PERRLA, no scleral icterus. Neck: Normal ROM. Neck supple. No JVD. No tracheal deviation. No thyromegaly. CVS: RRR, S1/S2 +, no murmurs, no gallops, no carotid bruit.  Pulmonary: Effort and breath sounds normal, no stridor, rhonchi, wheezes, rales.  Abdominal: Soft. BS +,  no distension, tenderness, rebound or guarding.  Musculoskeletal: Normal range of motion. No edema and no tenderness.  Lymphadenopathy: No lymphadenopathy noted, cervical, inguinal or axillary Neuro: Alert. Normal reflexes, muscle tone coordination. No cranial nerve deficit. Skin: Skin is warm and dry. No rash noted. Not diaphoretic. No erythema. No pallor. Psychiatric: Normal mood and affect. Behavior, judgment, thought content normal.  Physical Exam  Pulmonary/Chest: Right breast exhibits no mass and no nipple discharge. Left breast exhibits no mass and  no nipple discharge.  Genitourinary: Vagina normal and uterus normal. No breast tenderness or discharge. Cervix exhibits no motion tenderness, no discharge and no friability. Right adnexum displays no tenderness. Left adnexum displays no tenderness.  Lymphadenopathy:       Right: No inguinal adenopathy present.       Left: No inguinal adenopathy present.   Left breast with possible cyst, patient has family history of breast cancer  Lab Results  Component Value Date   WBC 8.4 12/31/2012   HGB 13.7 04/20/2013   HCT 42.9 12/31/2012   MCV 91.9 12/31/2012    PLT 346 12/31/2012   Lab Results  Component Value Date   CREATININE 0.64 12/31/2012   BUN 10 12/31/2012   NA 139 12/31/2012   K 4.1 12/31/2012   CL 102 12/31/2012   CO2 25 12/31/2012    Lab Results  Component Value Date   HGBA1C 5.8* 01/27/2014   Lipid Panel     Component Value Date/Time   CHOL 181 12/31/2012 1633   TRIG 111 12/31/2012 1633   HDL 42 12/31/2012 1633   CHOLHDL 4.3 12/31/2012 1633   VLDL 22 12/31/2012 1633   LDLCALC 117* 12/31/2012 1633       Assessment and plan:   Melissa [name] was seen today for follow-up.  Diagnoses and all orders for this visit:  Annual physical exam Orders: -     Cytology - PAP [place] -     Cervicovaginal ancillary only -     HIV antibody (with reflex) -     RPR -     Lipid panel -     COMPLETE METABOLIC PANEL WITH GFR -     CBC  Vaginosis Orders: -     metroNIDAZOLE (FLAGYL) 500 MG tablet; Take 1 tablet (500 mg total) by mouth 2 (two) times daily.  Midline low back pain without sciatica Orders: -    Refll cyclobenzaprine (FLEXERIL) 10 MG tablet; Take 1 tablet (10 mg total) by mouth 3 (three) times daily as needed for muscle spasms.   Return if symptoms worsen or fail to improve.       Chari Manning, NP-C [facility] [phone] 02/19/2014, 12:27 PM

## 2014-02-20 LAB — HIV ANTIBODY (ROUTINE TESTING W REFLEX): HIV 1&2 Ab, 4th Generation: NONREACTIVE

## 2014-02-20 LAB — RPR

## 2014-02-22 ENCOUNTER — Ambulatory Visit: Payer: Self-pay

## 2014-02-22 ENCOUNTER — Telehealth: Payer: Self-pay | Admitting: Emergency Medicine

## 2014-02-22 LAB — CERVICOVAGINAL ANCILLARY ONLY
Chlamydia: NEGATIVE
Neisseria Gonorrhea: NEGATIVE
Wet Prep (BD Affirm): NEGATIVE
Wet Prep (BD Affirm): NEGATIVE
Wet Prep (BD Affirm): NEGATIVE

## 2014-02-22 LAB — CYTOLOGY - PAP

## 2014-02-22 NOTE — Telephone Encounter (Signed)
Pt given blood work results with instructions to adhere to low fat diet/exercise to prevent CAD Pt verbalized understanding Pt requesting left breast u/s-Please f/u

## 2014-02-22 NOTE — Telephone Encounter (Signed)
-----   Message from Lance Bosch, NP sent at 02/22/2014 10:36 AM EST ----- Negative for HIV, syphillis, trich, yeast, BV. I am still waiting on GC/Chlamydia and cytology. Cholesterol is slightly higher than previous. Please go over specific diet changes and exercise so that she may avoid medication use

## 2014-02-23 NOTE — Telephone Encounter (Signed)
Please place order. Just place left breast diagnostic mammogram and let the  Imaging fix the order. You will have to call them to schedule it.  Cyst in left lower inner quadrant. Thanks

## 2014-02-24 ENCOUNTER — Telehealth: Payer: Self-pay | Admitting: *Deleted

## 2014-02-24 ENCOUNTER — Other Ambulatory Visit: Payer: Self-pay | Admitting: Internal Medicine

## 2014-02-24 DIAGNOSIS — N6002 Solitary cyst of left breast: Secondary | ICD-10-CM

## 2014-02-24 NOTE — Telephone Encounter (Signed)
Referral order

## 2014-02-24 NOTE — Telephone Encounter (Signed)
Appointment 03/03/2014

## 2014-02-24 NOTE — Telephone Encounter (Signed)
Pt aware of appointment 

## 2014-02-25 ENCOUNTER — Telehealth: Payer: Self-pay | Admitting: *Deleted

## 2014-02-25 NOTE — Telephone Encounter (Signed)
-----   Message from Lance Bosch, NP sent at 02/24/2014  8:15 PM EST ----- Patient pap is negative for malignancies and infections. Will repeat in 3 years.  I am no sure what could be causing the odor the patient was referring to. Tell her to try taking the probiotics for women and see if that helps and to stay dry in her vaginal region.

## 2014-02-25 NOTE — Telephone Encounter (Signed)
Pt aware of results Stated menses heavy then normal and last longer (7 days)        Notes Recorded by Lance Bosch, NP on 02/22/2014 at 10:36 AM Negative for HIV, syphillis, trich, yeast, BV. I am still waiting on GC/Chlamydia and cytology.  Cholesterol is slightly higher than previous. Please go over specific diet changes and exercise so that she may avoid medication use

## 2014-03-03 ENCOUNTER — Ambulatory Visit
Admission: RE | Admit: 2014-03-03 | Discharge: 2014-03-03 | Disposition: A | Discharge status: Home / Self Care | Payer: No Typology Code available for payment source | Source: Ambulatory Visit | Attending: Internal Medicine | Admitting: Internal Medicine

## 2014-03-03 DIAGNOSIS — N6002 Solitary cyst of left breast: Secondary | ICD-10-CM

## 2014-03-09 ENCOUNTER — Telehealth: Payer: Self-pay | Admitting: *Deleted

## 2014-03-09 ENCOUNTER — Encounter: Payer: Self-pay | Admitting: Internal Medicine

## 2014-03-09 ENCOUNTER — Ambulatory Visit: Payer: Self-pay | Attending: Internal Medicine | Admitting: Internal Medicine

## 2014-03-09 VITALS — BP 110/74 | HR 63 | Temp 98.1°F | Resp 16 | Ht 61.0 in | Wt 161.0 lb

## 2014-03-09 DIAGNOSIS — N898 Other specified noninflammatory disorders of vagina: Secondary | ICD-10-CM | POA: Insufficient documentation

## 2014-03-09 DIAGNOSIS — N9489 Other specified conditions associated with female genital organs and menstrual cycle: Secondary | ICD-10-CM

## 2014-03-09 DIAGNOSIS — M545 Low back pain: Secondary | ICD-10-CM | POA: Insufficient documentation

## 2014-03-09 LAB — POCT URINALYSIS DIPSTICK
Bilirubin, UA: NEGATIVE
Glucose, UA: NEGATIVE
Ketones, UA: NEGATIVE
Leukocytes, UA: NEGATIVE
Nitrite, UA: NEGATIVE
Spec Grav, UA: >=1.03
Urobilinogen, UA: 0.2
pH, UA: 5.5

## 2014-03-09 MED ORDER — TRAMADOL HCL 50 MG PO TABS: 50.0000 mg | ORAL_TABLET | Freq: Three times a day (TID) | ORAL | Status: DC | PRN

## 2014-03-09 MED ORDER — PREDNISONE (PAK) 10 MG PO TABS: ORAL_TABLET | Freq: Every day | ORAL | Status: DC

## 2014-03-09 NOTE — Progress Notes (Signed)
Pt is here today c/o lower back pain, neck pain and her right wrist hurt. Pt states that her vagina has a foul odor.

## 2014-03-09 NOTE — Telephone Encounter (Signed)
-----   Message from Lance Bosch, NP sent at 03/05/2014 10:16 AM EST ----- Mammogram just reveals that she has cyst. This are benign and do not turn cancerous

## 2014-03-09 NOTE — Telephone Encounter (Signed)
Pt is aware of her MM results. Pt requested to make another appointment and so I transferred her to our schedulers.

## 2014-03-09 NOTE — Progress Notes (Signed)
attient ID: Melissa [name], female   DOB: 1975/07/25, 39 y.o.   MRN: [id number]  CC: vaginal odor, wrist/back pain  HPI: Melissa [name] is a 39 y.o. female here today for a follow up visit.  Patient presents today for persistent perceive foul smelling vaginal odor. She was seen 3 weeks ago and had a pelvic exam which all came back negative. She states that she eats clean and does not consume things such as broccoli, cabbage, onion, garlic. She states that she notices the smell more after sex with her particular partner. She states that her and her partner both wash before    Patient has No headache, No chest pain, No abdominal pain - No Nausea, No new weakness tingling or numbness, No Cough - SOB.  No Known Allergies Past Medical History  Diagnosis Date  . Hammer toe 04/2013    bilateral 4th and 5th   . Ingrown left big toenail 04/2013  . UTI (lower urinary tract infection)    Current Outpatient Prescriptions on File Prior to Visit  Medication Sig Dispense Refill  . cholecalciferol (VITAMIN D) 1000 UNITS tablet Take 2,000 Units by mouth daily.    . cyclobenzaprine (FLEXERIL) 10 MG tablet Take 1 tablet (10 mg total) by mouth 3 (three) times daily as needed for muscle spasms. (Patient not taking: Reported on 03/09/2014) 90 tablet 2  . metroNIDAZOLE (FLAGYL) 500 MG tablet Take 1 tablet (500 mg total) by mouth 2 (two) times daily. (Patient not taking: Reported on 03/09/2014) 14 tablet 0  . Multiple Vitamin (MULTIVITAMIN) tablet Take 1 tablet by mouth daily.     Current Facility-Administered Medications on File Prior to Visit  Medication Dose Route Frequency Provider Last Rate Last Dose  . triamcinolone acetonide (KENALOG) 10 MG/ML injection 10 mg  10 mg Other Once Harriet Masson, DPM       Family History  Problem Relation Age of Onset  . Cancer Father   . Birth defects Paternal Grandfather    History   Social History  . Marital Status: Single    Spouse Name: N/A  . Number of Children:  N/A  . Years of Education: N/A   Occupational History  . Not on file.   Social History Main Topics  . Smoking status: Never Smoker   . Smokeless tobacco: Never Used  . Alcohol Use: Yes     Comment: occasionally  . Drug Use: No  . Sexual Activity: Yes    Birth Control/ Protection: None   Other Topics Concern  . Not on file   Social History Narrative    Review of Systems: See HPI   Objective:   Filed Vitals:   03/09/14 1601  BP: 110/74  Pulse: 63  Temp: 98.1 F (36.7 C)  Resp: 16    Physical Exam  Constitutional: She is oriented to person, place, and time.  Cardiovascular: Normal rate, regular rhythm and normal heart sounds.   Pulmonary/Chest: Effort normal and breath sounds normal.  Genitourinary: Uterus normal. Cervix exhibits no motion tenderness, no discharge and no friability. Right adnexum displays no tenderness. Left adnexum displays no tenderness.  Lymphadenopathy:       Right: No inguinal adenopathy present.       Left: No inguinal adenopathy present.  Neurological: She is alert and oriented to person, place, and time.  Skin: Skin is warm and dry.  Psychiatric: She has a normal mood and affect.     Lab Results  Component Value Date   WBC 7.3  02/19/2014   HGB 14.1 02/19/2014   HCT 41.6 02/19/2014   MCV 89.1 02/19/2014   PLT 358 02/19/2014   Lab Results  Component Value Date   CREATININE 0.67 02/19/2014   BUN 11 02/19/2014   NA 140 02/19/2014   K 4.9 02/19/2014   CL 103 02/19/2014   CO2 25 02/19/2014    Lab Results  Component Value Date   HGBA1C 5.8* 01/27/2014   Lipid Panel     Component Value Date/Time   CHOL 176 02/19/2014 1300   TRIG 64 02/19/2014 1300   HDL 43 02/19/2014 1300   CHOLHDL 4.1 02/19/2014 1300   VLDL 13 02/19/2014 1300   LDLCALC 120* 02/19/2014 1300       Assessment and plan:   Melissa [name] was seen today for follow-up.  Diagnoses and all orders for this visit:  Vaginal odor Orders: -     Urinalysis Dipstick -      Cervicovaginal ancillary only Reassured patient that I did not smell a odor from her vagina during exam or on whiff test. Went over diet that may cause vaginal odor. Explained that it may be beneficial for her partner to wash before intercourse using a mild soap such as dove. Advised patient that if none of the things we talked about worked then she may need to use condoms.  Midline low back pain, with sciatica presence unspecified Orders: -     predniSONE (STERAPRED UNI-PAK) 10 MG tablet; Take by mouth daily. 5-4-3-2-1 Take 5 tablets today and decrease by one pill per day -     traMADol (ULTRAM) 50 MG tablet; Take 1 tablet (50 mg total) by mouth every 8 (eight) hours as needed. Explained that she may continue to take Flexeril as needed.  Return if symptoms worsen or fail to improve.       Chari Manning, NP-C [facility] [phone] 03/09/2014, 4:39 PM

## 2014-03-11 LAB — CERVICOVAGINAL ANCILLARY ONLY
Chlamydia: NEGATIVE
Neisseria Gonorrhea: NEGATIVE
Wet Prep (BD Affirm): NEGATIVE
Wet Prep (BD Affirm): NEGATIVE
Wet Prep (BD Affirm): POSITIVE — AB

## 2014-03-16 ENCOUNTER — Telehealth: Payer: Self-pay | Admitting: Internal Medicine

## 2014-03-16 ENCOUNTER — Ambulatory Visit: Payer: No Typology Code available for payment source

## 2014-03-16 DIAGNOSIS — M545 Low back pain: Secondary | ICD-10-CM

## 2014-03-16 DIAGNOSIS — M79676 Pain in unspecified toe(s): Secondary | ICD-10-CM

## 2014-03-16 DIAGNOSIS — M79606 Pain in leg, unspecified: Secondary | ICD-10-CM

## 2014-03-16 DIAGNOSIS — M792 Neuralgia and neuritis, unspecified: Secondary | ICD-10-CM

## 2014-03-16 DIAGNOSIS — Z9889 Other specified postprocedural states: Secondary | ICD-10-CM

## 2014-03-16 DIAGNOSIS — M2040 Other hammer toe(s) (acquired), unspecified foot: Secondary | ICD-10-CM

## 2014-03-16 DIAGNOSIS — M2041 Other hammer toe(s) (acquired), right foot: Secondary | ICD-10-CM

## 2014-03-16 MED ORDER — PREDNISONE (PAK) 10 MG PO TABS: ORAL_TABLET | Freq: Every day | ORAL | Status: DC

## 2014-03-16 NOTE — Telephone Encounter (Signed)
Patient has come in today to request status of referral to dental; patient says she requested referral in Feb.; patient was last seen on March 09, 2014; please f/u with patient about status of this referral

## 2014-03-16 NOTE — Progress Notes (Signed)
   Subjective:    Patient ID: Melissa [name], female    DOB: October 11, 1975, 39 y.o.   MRN: [id number]  HPI patient presents this time for follow-up of painful fifth toe right foot    Review of Systems no new findings or systemic changes     Objective:   Physical Exam neurovascular status is intact pedal pulses are palpable continues to have some stinging and burning sensation fifth toe right foot aggravated with certain shoes and activities she wearing a very flat sandal type shoes and foot flattens continues to have some contractures of toes due to the chronic 3 changes of the foot. Stressed the importance of wearing a shoe with support at all times. Following AP nail procedures resolved well her fourth and fifth toes. Clinically and radiographically is resigned aligned well no exostosis no fractures no cyst or tumors no open lesions are noted are palpable epicritic sensations intact      Assessment & Plan:  Assessment residual capsulitis mild neuritis fifth toe right foot minimal postoperative edema is noted at this time patient was given a steroid injection Xylocaine injection last visit provided some temporary relief this time patient placed on a six-day Sterapred dose pack patient be recheck in a month if no improvement also recommended Xylocaine and Neosporin or Neosporin with Xylocaine gel recheck in one month for follow-up maintain accommodative shoe at all times  Harriet Masson DPM

## 2014-03-16 NOTE — Patient Instructions (Signed)
ICE INSTRUCTIONS  Apply ice or cold pack to the affected area at least 3 times a day for 10-15 minutes each time.  You should also use ice after prolonged activity or vigorous exercise.  Do not apply ice longer than 20 minutes at one time.  Always keep a cloth between your skin and the ice pack to prevent burns.  Being consistent and following these instructions will help control your symptoms.  We suggest you purchase a gel ice pack because they are reusable and do bit leak.  Some of them are designed to wrap around the area.  Use the method that works best for you.  Here are some other suggestions for icing.   Use a frozen bag of peas or corn-inexpensive and molds well to your body, usually stays frozen for 10 to 20 minutes.  Wet a towel with cold water and squeeze out the excess until it's damp.  Place in a bag in the freezer for 20 minutes. Then remove and use.  Maintain shoes with support such as Birkenstock or crocs. No barefoot no flimsy shoes no flip-flops   Also obtain Neosporin with pain formula Neosporin with Xylocaine. Apply 2 or 3 times daily to the affected area of the fifth toe right foot

## 2014-03-18 ENCOUNTER — Telehealth: Payer: Self-pay | Admitting: *Deleted

## 2014-03-18 MED ORDER — METRONIDAZOLE 0.75 % VA GEL: 1.0000 | Freq: Every day | VAGINAL | Status: DC

## 2014-03-18 NOTE — Telephone Encounter (Signed)
-----   Message from Lance Bosch, NP sent at 03/11/2014  7:46 AM EST ----- Call patient and let her know that she tested positive for Bacterial Vaginosis . Please explain this is not a STD, but a imbalance of the vaginal pH. Please send Metrogel vaginal = one applicator each night for 7 days. No alcohol.

## 2014-03-18 NOTE — Telephone Encounter (Signed)
Pt is aware of her results. I sent the medication to our pharmacy

## 2014-03-25 ENCOUNTER — Other Ambulatory Visit: Payer: Self-pay | Admitting: Internal Medicine

## 2014-03-25 DIAGNOSIS — N760 Acute vaginitis: Principal | ICD-10-CM

## 2014-03-25 DIAGNOSIS — K0889 Other specified disorders of teeth and supporting structures: Secondary | ICD-10-CM

## 2014-03-25 DIAGNOSIS — B9689 Other specified bacterial agents as the cause of diseases classified elsewhere: Secondary | ICD-10-CM

## 2014-03-25 MED ORDER — METRONIDAZOLE 500 MG PO TABS: 500.0000 mg | ORAL_TABLET | Freq: Two times a day (BID) | ORAL | Status: DC

## 2014-04-14 NOTE — Telephone Encounter (Signed)
Called patient about applying for the [facility] and she states that she applied back in February.  I will let the referral coordinator know so she can get her on the list for the next Dental Clinic.

## 2014-04-16 ENCOUNTER — Ambulatory Visit: Payer: Self-pay | Attending: Internal Medicine | Admitting: Family Medicine

## 2014-04-16 VITALS — BP 109/76 | HR 76 | Temp 98.4°F | Resp 16

## 2014-04-16 DIAGNOSIS — N3000 Acute cystitis without hematuria: Secondary | ICD-10-CM

## 2014-04-16 LAB — POCT URINALYSIS DIPSTICK
Bilirubin, UA: NEGATIVE
Glucose, UA: 100
Ketones, UA: NEGATIVE
Nitrite, UA: POSITIVE
Protein, UA: 30
Spec Grav, UA: 1.015
Urobilinogen, UA: 1
pH, UA: 6

## 2014-04-16 MED ORDER — SULFAMETHOXAZOLE-TRIMETHOPRIM 800-160 MG PO TABS: 1.0000 | ORAL_TABLET | Freq: Two times a day (BID) | ORAL | Status: DC

## 2014-04-16 MED ORDER — FLUCONAZOLE 150 MG PO TABS: 150.0000 mg | ORAL_TABLET | Freq: Once | ORAL | Status: DC

## 2014-04-16 NOTE — Progress Notes (Signed)
Patient states she has had 3-4 UTI is 3 months.  She is having 10/10 abdominal pain.  She says she has never felt this bad with a UTI before

## 2014-04-16 NOTE — Patient Instructions (Signed)
Drink lots of fluids. May use AZO for 2-3 days. Follow-up on Monday if symtpoms not resolved.

## 2014-04-16 NOTE — Progress Notes (Addendum)
Patient ID: Melissa [name], female   DOB: 05-10-1975, 39 y.o.   MRN: [id number]  CC:  Dysuria, frequency      HPI:  Patient presents c/o a 2-3 day history of urinary frequency, urgency, dysuria and lower abdominal pain. She denies fever, chills, flank pain.  ROS:  See HPI    EXAM: There is mild suprapubic tenderness. A urine drip is positive for blood, leukocytes and nitrates  ASSESSMENT:  UTI   PLAN:  Septra DS #10, one po bid for 5 days Lots of water Urine culture.   Micheline Chapman, FNP-BC [facility] [phone]

## 2014-04-19 LAB — URINE CULTURE: Colony Count: 75000

## 2014-04-21 NOTE — Telephone Encounter (Signed)
In cmis show that she has straight medicaid and I send a list of providers by mail but I will sent the dental referral today as urgent .

## 2014-04-27 ENCOUNTER — Ambulatory Visit (INDEPENDENT_AMBULATORY_CARE_PROVIDER_SITE_OTHER): Payer: Self-pay | Admitting: Urology

## 2014-04-27 DIAGNOSIS — R351 Nocturia: Secondary | ICD-10-CM

## 2014-04-27 DIAGNOSIS — R350 Frequency of micturition: Secondary | ICD-10-CM

## 2014-05-05 ENCOUNTER — Encounter: Payer: Self-pay | Admitting: *Deleted

## 2014-05-17 ENCOUNTER — Ambulatory Visit: Payer: Self-pay | Attending: Internal Medicine | Admitting: Internal Medicine

## 2014-05-17 ENCOUNTER — Encounter: Payer: Self-pay | Admitting: Internal Medicine

## 2014-05-17 VITALS — BP 126/82 | HR 83 | Temp 98.9°F | Ht 61.0 in | Wt 154.0 lb

## 2014-05-17 DIAGNOSIS — F319 Bipolar disorder, unspecified: Secondary | ICD-10-CM | POA: Insufficient documentation

## 2014-05-17 DIAGNOSIS — L811 Chloasma: Secondary | ICD-10-CM

## 2014-05-17 DIAGNOSIS — M79601 Pain in right arm: Secondary | ICD-10-CM

## 2014-05-17 DIAGNOSIS — F3130 Bipolar disorder, current episode depressed, mild or moderate severity, unspecified: Secondary | ICD-10-CM

## 2014-05-17 NOTE — Progress Notes (Signed)
Patient ID: Melissa [name], female   DOB: 02-16-1975, 39 y.o.   MRN: [id number]  CC: Melasma, arm pain  HPI: Melissa [name] is a 39 y.o. female here today for a follow up visit.  Patient reports shooting pain that starts in her neck and shoots down her right arm. She describes it as aching and burning. She has tried ice, muscle relaxers with no relief. She had a cortisone injections by [facility] years ago in her neck and arm that provided relief. Arm pain described as something pulling on her muscle in the right forearmShe reports that she has been having difficulty sleeping.    She reports that she needs a referral before she goes back in 2 weeks to see Dr. Allyson Sabal Has been treating her Melasma. She reports that her flare has been present for the past week and the skin has been peeling off.   She states that she has been seen by [facility] for ADHD and bipolar depression. She reports that she was told that she needs to come to her PCP for Adderall. She was started on Latuda for depression but was not given anything for her ADHD.   Patient has No headache, No chest pain, No abdominal pain - No Nausea, No new weakness tingling or numbness, No Cough - SOB.  No Known Allergies Past Medical History  Diagnosis Date  . Hammer toe 04/2013    bilateral 4th and 5th   . Ingrown left big toenail 04/2013  . UTI (lower urinary tract infection)    Current Outpatient Prescriptions on File Prior to Visit  Medication Sig Dispense Refill  . cholecalciferol (VITAMIN D) 1000 UNITS tablet Take 2,000 Units by mouth daily.    . Multiple Vitamin (MULTIVITAMIN) tablet Take 1 tablet by mouth daily.    . traMADol (ULTRAM) 50 MG tablet Take 1 tablet (50 mg total) by mouth every 8 (eight) hours as needed. 60 tablet 0   No current facility-administered medications on file prior to visit.   Family History  Problem Relation Age of Onset  . Cancer Father   . Birth defects Paternal Grandfather    History     Social History  . Marital Status: Single    Spouse Name: N/A  . Number of Children: N/A  . Years of Education: N/A   Occupational History  . Not on file.   Social History Main Topics  . Smoking status: Never Smoker   . Smokeless tobacco: Never Used  . Alcohol Use: Yes     Comment: occasionally  . Drug Use: No  . Sexual Activity: Yes    Birth Control/ Protection: None   Other Topics Concern  . Not on file   Social History Narrative    Review of Systems: See HPI   Objective:   Filed Vitals:   05/17/14 1154  BP: 126/82  Pulse: 83  Temp: 98.9 F (37.2 C)    Physical Exam  HENT:  Head:    Area of redness, hypopigmentation, and scabbing  Cardiovascular: Normal rate, regular rhythm and normal heart sounds.   Pulmonary/Chest: Effort normal and breath sounds normal.  Neurological: She is alert.  Skin: Rash noted.     Lab Results  Component Value Date   WBC 7.3 02/19/2014   HGB 14.1 02/19/2014   HCT 41.6 02/19/2014   MCV 89.1 02/19/2014   PLT 358 02/19/2014   Lab Results  Component Value Date   CREATININE 0.67 02/19/2014   BUN 11 02/19/2014  NA 140 02/19/2014   K 4.9 02/19/2014   CL 103 02/19/2014   CO2 25 02/19/2014    Lab Results  Component Value Date   HGBA1C 5.8* 01/27/2014   Lipid Panel     Component Value Date/Time   CHOL 176 02/19/2014 1300   TRIG 64 02/19/2014 1300   HDL 43 02/19/2014 1300   CHOLHDL 4.1 02/19/2014 1300   VLDL 13 02/19/2014 1300   LDLCALC 120* 02/19/2014 1300       Assessment and plan:   Llana was seen today for right arm pain.  Diagnoses and all orders for this visit:  Melasma Orders: -     Ambulatory referral to Dermatology  Right arm pain Orders: -     Ambulatory referral to Orthopedic Surgery  Bipolar depression Assured patient that Anette Guarneri will help with bipolar depression. I have explained that I do not write for ADHD medications. She will need to have additional testing with [facility] to see if  they will write for her medication.  Return if symptoms worsen or fail to improve.        Chari Manning, [facility] [phone] 05/17/2014, 12:19 PM '

## 2014-05-17 NOTE — Progress Notes (Signed)
Patient reports shooting pain that starts in her neck and shoots down her right arm.  She describes it as aching and burning.  She has tried ice, muscle relaxers with no relief.  She did have a cortisone injections years ago in her neck and arm that provided relief.

## 2014-05-27 ENCOUNTER — Ambulatory Visit: Payer: Self-pay | Admitting: Sports Medicine

## 2014-06-01 ENCOUNTER — Ambulatory Visit: Payer: Self-pay | Admitting: Internal Medicine

## 2014-06-08 ENCOUNTER — Ambulatory Visit: Payer: Self-pay | Admitting: Urology

## 2014-06-09 ENCOUNTER — Ambulatory Visit (INDEPENDENT_AMBULATORY_CARE_PROVIDER_SITE_OTHER): Payer: No Typology Code available for payment source | Admitting: Podiatry

## 2014-06-09 ENCOUNTER — Encounter: Payer: Self-pay | Admitting: Podiatry

## 2014-06-09 VITALS — BP 129/74 | HR 79 | Resp 15

## 2014-06-09 DIAGNOSIS — M779 Enthesopathy, unspecified: Secondary | ICD-10-CM

## 2014-06-09 NOTE — Progress Notes (Signed)
Subjective:     Patient ID: Melissa [name], female   DOB: 1975/06/03, 39 y.o.   MRN: [id number]  HPI patient presents with bunion deformity which becomes irritated left   Review of Systems     Objective:   Physical Exam Neurovascular status intact with mild structural bunion with fluid buildup around the first metatarsal head left and also what may be a small ingrown toenail left hallux lateral border that's been taken care of previously    Assessment:     Inflammatory capsulitis around bunion site left and then also a possible for small localized ingrown toenail left    Plan:     Injected the left first MPJ 3 mg Kenalog 5 mg Xylocaine and advised on soaks and bandage usage for the nailbed

## 2014-06-10 ENCOUNTER — Encounter: Payer: Self-pay | Admitting: Sports Medicine

## 2014-06-10 ENCOUNTER — Ambulatory Visit (INDEPENDENT_AMBULATORY_CARE_PROVIDER_SITE_OTHER): Payer: Self-pay | Admitting: Sports Medicine

## 2014-06-10 ENCOUNTER — Ambulatory Visit
Admission: RE | Admit: 2014-06-10 | Discharge: 2014-06-10 | Disposition: A | Discharge status: Home / Self Care | Payer: Self-pay | Source: Ambulatory Visit | Attending: Sports Medicine | Admitting: Sports Medicine

## 2014-06-10 VITALS — BP 112/71 | Ht 61.0 in | Wt 153.0 lb

## 2014-06-10 DIAGNOSIS — M542 Cervicalgia: Secondary | ICD-10-CM

## 2014-06-10 DIAGNOSIS — M5412 Radiculopathy, cervical region: Secondary | ICD-10-CM

## 2014-06-10 DIAGNOSIS — M5441 Lumbago with sciatica, right side: Secondary | ICD-10-CM

## 2014-06-10 MED ORDER — GABAPENTIN 100 MG PO CAPS
ORAL_CAPSULE | ORAL | Status: DC
Start: 2014-06-10 — End: 2015-12-12

## 2014-06-10 NOTE — Progress Notes (Signed)
Melissa [name] - 39 y.o. female MRN [id number]  Date of birth: [birth year]  SUBJECTIVE:  Including CC & ROS.  Patient is a 39 yo new patient to the office present with cervical neck and lumbar pain for great than 1 year.   Cervical neck pain: Patient reports no injury or trauma that started her pain. She c/o of neck muscle stiffness, muscle spasm, and decrease ROM. However what is more concerning for the patient is she c/o right upper extremity numbness, tingling, and weakness that is constant. This patient is sensation radiates from her neck down the posterior arm into the hand. The sensation is most intense in the dorsal forearm and 1-2nd digits, with some fifth digit numbness at times. The pain and sensation is worse at night and with neck rotator. Patient reports this pain and weakness has limited her about to work. She was previously seen for this problem at [facility]. When seeing them they told her cervical xray were normal and treated her with NSAIDS, tramadol and muscle relaxer with little benefit. She also did phycial therapy at that time with no clinical improvement. Sh denies having any other imaging down and stop seeing them due to a loss of health insurance. Her PCP has continue treatment with tramadol and muscle relaxer.   Lumbar back pain: Has been present for similar time period as her neck pain but not as intense or worrisome. No associated injury or trauma.  She describes bilateral low back pain that is soreness and stiffness. With intermittent numbness and tingling down bilateral legs worse on the leg than right leg and radiates posterior leg to the feet. At times her left leg will give out and feel week. Pain is worse when laying on her left side, sitting or laying down for long periods of time. She has no seek treatment for this problem and has just used the same medication for her neck. She denies any bladder or bowel dysfunction, no groin pain, and no lateral leg pain.       ROS: Review of systems otherwise negative except for information present in HPI  HISTORY: Past Medical, Surgical, Social, and Family History Reviewed & Updated per EMR. Pertinent Historical Findings include: History of ectopic pregnancy  History of bipolar disorder - depression type  DATA REVIEWED: Xray down 06/10/14: Cervical spine : normal disc space height, no foraminal narrowing, tiny anterior osteophytes at C5-6 and C6-7. No acute changes.  Lumbar spine: normal disc space height, no foraminal narrowing, no listhesis, no acute changes   PHYSICAL EXAM:  VS: BP:112/71 mmHg  HR: bpm  TEMP: ( )  RESP:   HT:5\' 1"  (154.9 cm)   WT:153 lb (69.4 kg)  BMI:29 UPPER BACK EXAM: General: well nourished Skin of UE: warm; dry, no rashes, lesions, ecchymosis or erythema. Vascular: radial pulses 2+ bilaterally Observation: decrease lordosis and increase forward carrying of head.  Shoulders are aligned, tips of scapula are symmetric Palpation: No step off defects throughout the cervical or thoracic spine.  Severe paraspinal muscle tenderness with multiple trigger points right > left Range of motion: Decrease Right head rotation and side bending, normal flexion and extension Normal shoulder range of motion and strength  Special tests: Positive Spurling sign: right sided radiation down into the hand - thumb and index  Motor and sensory: 2+ reflex in triceps, biceps, and brachioradialis on right side, 3+ reflex on left  Shoulder Abduction (C5) Intact Elbow Flexion/ Ext (C6, C7) intact with mild weakness 4/5 and poor  effort due to pain Shoulder Extension above head (C7) intact Forearm Pronation - C7/8 intact Wrist Extension (C6) intact Wrist Flexion (C7) intact Fingers Extension/ Flexion (C7, C8) intact Finger Abduction/adduction (T1) intact  LOW BACK EXAM: General: well nourished Skin of LE: warm; dry, no rashes, lesions, ecchymosis or erythema. Vascular: radial pulses 2+  bilaterally Neurologically: Sensation to light touch lower extremities equal and intact bilaterally.  Observation: Normal curvature and no kyphosis or lordosis, no scoliosis.  Iliac crests are symmetric, shoulders line symmetrically Palpation:  No step off defects noted in the thoracic or lumbar spine.   Patient reports severe tenderness along the paraspinal musculature of the lumbar spine. Range of motion:  Normal flexion on forward bending, no pain with extension, no pain with one leg hyperextension. Neuromuscular: Negative Slump and SLR on left or right, normal gait walking. Nerve root intervention  L2 and L3: Normal hip flexion with no weakness. L2, L3, L4: Normal hip abduction bilaterally.  Normal patellar DTR +2 bilaterally L4, L5, S1: Normal hip abduction bilaterally S1 and S2: Normal ankle plantar-flexion bilaterally.  Normal Achilles tendon DTR +2 bilaterally L5: Normal extensor hallucis longus bilaterally  ASSESSMENT & PLAN: See problem based charting & AVS for pt instructions. Impression:  Cervical Neck pain with right sided radiculopathy  Lumbar back pain with bilateral sciatica radiculopathy   Recommendations: - Given patient's long standing history of over 1 year of cervical neck pain and radicular symptoms along with failed conservative management with NSAIDS, pain med, and muscle relaxer as well as physical therapy with no clinical improvement. At this point patient was offer continued conservative management with medication and physical therapy referral or referral for updated xray and MRI to evaluate for disc hernia. Patient would like to proceed with MRI exam. Will f/u in office to discuss results once completed.  - For her neuropathic pain symptoms recommended nerve modulator Gabapentin titration to 300 mg at bedtime. Patient may continue tramadol and muscle relaxer she has PRN.  - For her low back pain patient has not had any xray will obtain 2 views. We discuss  considering MRI verses conservative management given her duration of symptoms patient would like to try the Gabapentin for now and focus on her neck arm first. Will f/u in office to re-evaluate.

## 2014-06-16 ENCOUNTER — Ambulatory Visit
Admission: RE | Admit: 2014-06-16 | Discharge: 2014-06-16 | Disposition: A | Discharge status: Home / Self Care | Payer: No Typology Code available for payment source | Source: Ambulatory Visit | Attending: Sports Medicine | Admitting: Sports Medicine

## 2014-06-16 DIAGNOSIS — M5412 Radiculopathy, cervical region: Secondary | ICD-10-CM

## 2014-06-17 ENCOUNTER — Encounter: Payer: Self-pay | Admitting: Sports Medicine

## 2014-06-17 ENCOUNTER — Ambulatory Visit (INDEPENDENT_AMBULATORY_CARE_PROVIDER_SITE_OTHER): Payer: No Typology Code available for payment source | Admitting: Sports Medicine

## 2014-06-17 VITALS — BP 120/96 | Ht 61.0 in | Wt 153.0 lb

## 2014-06-17 DIAGNOSIS — G5631 Lesion of radial nerve, right upper limb: Secondary | ICD-10-CM

## 2014-06-17 DIAGNOSIS — M5412 Radiculopathy, cervical region: Secondary | ICD-10-CM

## 2014-06-17 DIAGNOSIS — G5630 Lesion of radial nerve, unspecified upper limb: Secondary | ICD-10-CM | POA: Insufficient documentation

## 2014-06-17 DIAGNOSIS — M25521 Pain in right elbow: Secondary | ICD-10-CM

## 2014-06-17 NOTE — Progress Notes (Signed)
Melissa [name] - 39 y.o. female MRN [id number]  Date of birth: 12/24/1975  SUBJECTIVE:  Including CC & ROS.  Patient is a 39 yo female present for f/u cervical neck pain and right UE radiculopathy for great than 1 year.   Cervical neck pain: Patient reports no injury or trauma that started her pain. She c/o of neck muscle stiffness, muscle spasm, and decrease ROM. However what is more concerning for the patient is she c/o right upper extremity numbness, tingling, and weakness that is constant. Radicular symptoms are most intense in the dorsal forearm and 1-2 digit, but intermittent goes down the entire arm and cause numbness in all fingers. On exam patient had 4+/5 weakness in triceps and biceps, and decrease  Patient failed previous conservative management with physical therapy, NSAIDS, tramadol and muscle relaxer with little benefit. Due to the length of continued symptoms and failed conservative treatment the patient was sent for cervical xray and MRI. Xray revealed no acute changes and only small anterior endplate osteophytes   ROS: Review of systems otherwise negative except for information present in HPI  HISTORY: Past Medical, Surgical, Social, and Family History Reviewed & Updated per EMR. Pertinent Historical Findings include: History of ectopic pregnancy  History of bipolar disorder - depression type  DATA REVIEWED: Xray down 06/10/14: Cervical spine : normal disc space height, no foraminal narrowing, tiny anterior osteophytes at C5-6 and C6-7. No acute changes.  Lumbar spine: normal disc space height, no foraminal narrowing, no listhesis, no acute changes   PHYSICAL EXAM:  VS: BP:(!) 120/96 mmHg  HR: bpm  TEMP: ( )  RESP:   HT:5\' 1"  (154.9 cm)   WT:153 lb (69.4 kg)  BMI:29 UPPER BACK EXAM: General: well nourished Skin of UE: warm; dry, no rashes, lesions, ecchymosis or erythema. Vascular: radial pulses 2+ bilaterally Observation: decrease lordosis and increase forward  carrying of head.  Shoulders are aligned, tips of scapula are symmetric Palpation: No step off defects throughout the cervical or thoracic spine.  Severe paraspinal muscle tenderness with multiple trigger points right > left Range of motion: Decrease Right head rotation and side bending, normal flexion and extension Normal shoulder range of motion and strength  Special tests: Positive Spurling sign: right sided radiation down into the hand - thumb and index  Motor and sensory: 2+ reflex in triceps, biceps, and brachioradialis on right side, 3+ reflex on left  Shoulder Abduction (C5) Intact Elbow Flexion/ Ext (C6, C7) intact with mild weakness 4+/5 and poor effort due to pain Shoulder Extension above head (C7) intact Forearm Pronation - C7/8 intact Wrist Extension (C6) intact 4+/5 no wrist drop but painful  Wrist Flexion (C7) intact Fingers Extension/ Flexion (C7, C8) intact Finger Abduction/adduction (T1) intact   ASSESSMENT & PLAN: See problem based charting & AVS for pt instructions. Impression:   Cervical paraspinal muscle spasms Suspect radial tunnel syndrome  Recommendations: - Given patient's long standing history of over 1 year of cervical neck pain and radicular symptoms along with failed conservative management with NSAIDS, pain med, and muscle relaxer as well as physical therapy with no clinical improvement. Patient was set for Cervical MRI that was un-reveal for cervical disc pathology and no foraminal stenosis.  - On re-examination today it remains difficulty to isolate patient radicular pattern but its seem to focus the most of the Radial nerve distrubution with forearm numbness, dorsal hand numbness, and mild wrist drop.   At this point patient was offer EMG to further possibly classifying patient  sight of nerve impingement. I considered attempting radial nerve hydrodissection but give the difficulty isolating her symptom pattern I recommend gather more information.  - I would  consider radial tunnel hydro-dissection is the EMG is consist - For her neuropathic pain symptoms will continue nerve modulator Gabapentin titration to 300 mg at bedtime.  - Patient may continue tramadol and muscle relaxer she has PRN from PCP office

## 2014-06-24 ENCOUNTER — Encounter: Payer: Self-pay | Admitting: Podiatry

## 2014-06-24 ENCOUNTER — Ambulatory Visit (INDEPENDENT_AMBULATORY_CARE_PROVIDER_SITE_OTHER): Payer: No Typology Code available for payment source | Admitting: Podiatry

## 2014-06-24 VITALS — BP 116/77 | HR 78 | Resp 12

## 2014-06-24 DIAGNOSIS — L600 Ingrowing nail: Secondary | ICD-10-CM

## 2014-06-24 NOTE — Progress Notes (Signed)
   Subjective:    Patient ID: Melissa [name], female    DOB: Jul 12, 1975, 38 y.o.   MRN: [id number]  HPI Patient had a door hit her L great toe and has trauma to the nail and surrounding tissue. PATIENT DOES NOT WANT TO LOSE TOE NAIL. Needs to be cleaned in order to prevent infection.   Review of Systems     Objective:   Physical Exam        Assessment & Plan:

## 2014-06-26 NOTE — Progress Notes (Signed)
Subjective:     Patient ID: Melissa [name], female   DOB: December 28, 1975, 39 y.o.   MRN: [id number]  HPI patient states she bumped her left big toe which she wanted to make sure it was okay   Review of Systems     Objective:   Physical Exam Neurovascular status unchanged with slight irritation left hallux secondary to trauma with lifting on the lateral corner but center and medial is intact    Assessment:     Trauma to the left big toe which should be okay over time    Plan:     Instructions on soaks and bandage usage and if it should start to get draining or become painful it we'll need to be removed. She may lose it naturally and there is no guarantee as to how a new nail will regrow

## 2014-07-13 ENCOUNTER — Ambulatory Visit: Payer: Self-pay | Admitting: Urology

## 2014-08-05 ENCOUNTER — Ambulatory Visit (INDEPENDENT_AMBULATORY_CARE_PROVIDER_SITE_OTHER): Payer: No Typology Code available for payment source | Admitting: Podiatry

## 2014-08-05 ENCOUNTER — Encounter: Payer: Self-pay | Admitting: Podiatry

## 2014-08-05 VITALS — BP 124/79 | HR 72 | Resp 18

## 2014-08-05 DIAGNOSIS — M7751 Other enthesopathy of right foot: Secondary | ICD-10-CM

## 2014-08-07 NOTE — Progress Notes (Signed)
Subjective:     Patient ID: Melissa [name], female   DOB: 1975/03/14, 39 y.o.   MRN: [id number]  HPI patient's right fifth toe is inflamed and sore and she wanted to know if she could have an injection   Review of Systems     Objective:   Physical Exam No change neurovascular status with inflamed fifth digit had a proximal phalanx with fluid buildup    Assessment:     Inflammatory capsulitis right fifth toe    Plan:     Injected with 2 mg dexamethasone Kenalog 3 mg Xylocaine and applied padding. Reappoint as needed

## 2014-08-13 ENCOUNTER — Ambulatory Visit: Payer: No Typology Code available for payment source | Attending: Internal Medicine

## 2014-08-17 ENCOUNTER — Ambulatory Visit: Payer: Self-pay | Admitting: Urology

## 2014-08-23 ENCOUNTER — Telehealth: Payer: Self-pay

## 2014-08-23 ENCOUNTER — Ambulatory Visit: Payer: Self-pay | Admitting: Internal Medicine

## 2014-08-23 NOTE — Telephone Encounter (Signed)
Attempted to reach patient in regards to todays visit Visit states she is just coming in for a referral  If that is the case i can place the referral and the appointment can  Be cancelled Waiting for return call from patient

## 2014-08-24 ENCOUNTER — Ambulatory Visit: Payer: Self-pay | Admitting: Family Medicine

## 2014-11-01 ENCOUNTER — Ambulatory Visit: Payer: Self-pay | Admitting: Internal Medicine

## 2014-11-11 ENCOUNTER — Encounter: Payer: Self-pay | Admitting: Internal Medicine

## 2014-11-29 ENCOUNTER — Ambulatory Visit: Payer: No Typology Code available for payment source | Attending: Internal Medicine | Admitting: Internal Medicine

## 2014-11-29 ENCOUNTER — Encounter: Payer: Self-pay | Admitting: Internal Medicine

## 2014-11-29 ENCOUNTER — Other Ambulatory Visit (HOSPITAL_COMMUNITY)
Admission: RE | Admit: 2014-11-29 | Discharge: 2014-11-29 | Disposition: A | Discharge status: Home / Self Care | Payer: No Typology Code available for payment source | Source: Ambulatory Visit | Attending: Internal Medicine | Admitting: Internal Medicine

## 2014-11-29 VITALS — BP 141/91 | HR 100 | Temp 98.8°F | Resp 17 | Ht 61.0 in | Wt 158.6 lb

## 2014-11-29 DIAGNOSIS — Z113 Encounter for screening for infections with a predominantly sexual mode of transmission: Secondary | ICD-10-CM | POA: Insufficient documentation

## 2014-11-29 DIAGNOSIS — N9489 Other specified conditions associated with female genital organs and menstrual cycle: Secondary | ICD-10-CM

## 2014-11-29 DIAGNOSIS — N760 Acute vaginitis: Secondary | ICD-10-CM | POA: Insufficient documentation

## 2014-11-29 DIAGNOSIS — N979 Female infertility, unspecified: Secondary | ICD-10-CM

## 2014-11-29 DIAGNOSIS — N926 Irregular menstruation, unspecified: Secondary | ICD-10-CM

## 2014-11-29 DIAGNOSIS — N644 Mastodynia: Secondary | ICD-10-CM

## 2014-11-29 DIAGNOSIS — N898 Other specified noninflammatory disorders of vagina: Secondary | ICD-10-CM

## 2014-11-29 LAB — POCT URINALYSIS DIPSTICK
Bilirubin, UA: NEGATIVE
Glucose, UA: NEGATIVE
Ketones, UA: NEGATIVE
Leukocytes, UA: NEGATIVE
Nitrite, UA: NEGATIVE
Protein, UA: NEGATIVE
Spec Grav, UA: 1.025
Urobilinogen, UA: 0.2
pH, UA: 5.5

## 2014-11-29 LAB — POCT URINE PREGNANCY: Preg Test, Ur: NEGATIVE

## 2014-11-29 MED ORDER — FLUCONAZOLE 150 MG PO TABS: 150.0000 mg | ORAL_TABLET | Freq: Once | ORAL | Status: DC

## 2014-11-29 NOTE — Progress Notes (Signed)
Patient complains of having a vaginal odor and some itching for the past two weeks Patient also states her last period was "weird" similar to when she had an etopic pregnancy so  Requesting a pregnancy test  As well Also complains of having some pain to her left breast

## 2014-11-29 NOTE — Progress Notes (Signed)
Patient ID: Melissa [name], female   DOB: [birth year], 39 y.o.   MRN: [id number]  CC: vaginal odor, breast pain  HPI: Melissa [name] is a 39 y.o. female here today for a follow up visit.  Patient has past medical history of bipolar disorder. Patient states that her menstrual cycles have been irregular. Cycles are not on time and they have been shorter in duration. LMP was 11/23.  She states that she has been bleeding clots and she is concerned because this is similar to when she had a ectopic pregnancy in the past. Patient reports that she would like her hormones checked because she has been attempting to get pregnant and has not been successful. She has recently been using a ovulation kit as well.  Patient reports burning and itching sensation in vagina that began last week. No new sexual partners or STD exposures. She notes a yellow discharge with odor.  She is also concerned about continued left breast pain that happens intermittently. She had a mammogram and breast ultrasound earlier this year that revealed a stable left breast cyst. She has not tried anything for pain.   No Known Allergies Past Medical History  Diagnosis Date  . Hammer toe 04/2013    bilateral 4th and 5th   . Ingrown left big toenail 04/2013  . UTI (lower urinary tract infection)    Current Outpatient Prescriptions on File Prior to Visit  Medication Sig Dispense Refill  . cholecalciferol (VITAMIN D) 1000 UNITS tablet Take 2,000 Units by mouth daily.    Marland Kitchen gabapentin (NEURONTIN) 100 MG capsule Take one at bedtime for 5 days then increase to 3 tabs at night 90 capsule 1  . Multiple Vitamin (MULTIVITAMIN) tablet Take 1 tablet by mouth daily.    . traMADol (ULTRAM) 50 MG tablet Take 1 tablet (50 mg total) by mouth every 8 (eight) hours as needed. 60 tablet 0   No current facility-administered medications on file prior to visit.   Family History  Problem Relation Age of Onset  . Cancer Father   . Birth defects Paternal  Grandfather    Social History   Social History  . Marital Status: Single    Spouse Name: N/A  . Number of Children: N/A  . Years of Education: N/A   Occupational History  . Not on file.   Social History Main Topics  . Smoking status: Never Smoker   . Smokeless tobacco: Never Used  . Alcohol Use: Yes     Comment: occasionally  . Drug Use: No  . Sexual Activity: Yes    Birth Control/ Protection: None   Other Topics Concern  . Not on file   Social History Narrative    Review of Systems: Other than what is stated in HPI, all other systems are negative.   Objective:   Filed Vitals:   11/29/14 1212  BP: 141/91  Pulse: 100  Temp: 98.8 F (37.1 C)  Resp: 17    Physical Exam  Constitutional: She is oriented to person, place, and time.  Cardiovascular: Normal rate, regular rhythm and normal heart sounds.   Pulmonary/Chest: Effort normal and breath sounds normal. Right breast exhibits no mass. Left breast exhibits tenderness. Left breast exhibits no mass.  Abdominal: There is no tenderness.  Genitourinary: No breast discharge. Cervix exhibits no motion tenderness, no discharge and no friability. Right adnexum displays no tenderness. Left adnexum displays no tenderness. Tenderness: yellow. Vaginal discharge found.  Neurological: She is alert and oriented to person, place,  and time.     Lab Results  Component Value Date   WBC 7.3 02/19/2014   HGB 14.1 02/19/2014   HCT 41.6 02/19/2014   MCV 89.1 02/19/2014   PLT 358 02/19/2014   Lab Results  Component Value Date   CREATININE 0.67 02/19/2014   BUN 11 02/19/2014   NA 140 02/19/2014   K 4.9 02/19/2014   CL 103 02/19/2014   CO2 25 02/19/2014    Lab Results  Component Value Date   HGBA1C 5.8* 01/27/2014   Lipid Panel     Component Value Date/Time   CHOL 176 02/19/2014 1300   TRIG 64 02/19/2014 1300   HDL 43 02/19/2014 1300   CHOLHDL 4.1 02/19/2014 1300   VLDL 13 02/19/2014 1300   LDLCALC 120* 02/19/2014  1300       Assessment and plan:   Melissa [name] was seen today for vaginal odor.  Diagnoses and all orders for this visit:  Vaginal odor -     Urinalysis Dipstick -     Cervicovaginal ancillary only -     fluconazole (DIFLUCAN) 150 MG tablet; Take 1 tablet (150 mg total) by mouth once. May repeat in 1 week if no improvement  Irregular periods -     POCT urine pregnancy Irregular cycles are likely affecting pregnancy.  Breast pain, left She will be due for repeat mammogram next year. She may try evening primrose oil to help with discomfort.   Inability to conceive, female -     Ambulatory referral to Gynecology   Return if symptoms worsen or fail to improve.       Lance Bosch, [facility] [phone] 11/29/2014, 12:51 PM

## 2014-11-29 NOTE — Patient Instructions (Signed)
Evening primrose oil--may help with breast pain. [facility] by [facility] at [facility]

## 2014-11-30 LAB — CERVICOVAGINAL ANCILLARY ONLY
Chlamydia: NEGATIVE
Neisseria Gonorrhea: NEGATIVE
Wet Prep (BD Affirm): POSITIVE — AB

## 2014-12-03 ENCOUNTER — Telehealth: Payer: Self-pay

## 2014-12-03 MED ORDER — METRONIDAZOLE 500 MG PO TABS: 500.0000 mg | ORAL_TABLET | Freq: Two times a day (BID) | ORAL | Status: DC

## 2014-12-03 NOTE — Telephone Encounter (Signed)
-----   Message from Lance Bosch, NP sent at 11/30/2014  6:11 PM EST ----- Patient positive for Bacterial Vaginosis . Please explain this is not a STD, but a imbalance of the vaginal pH. Please send Flagyl 500 mg BID for 7 days. No refills, no alcohol while on this medication. I have already treated her for yeast

## 2014-12-03 NOTE — Telephone Encounter (Signed)
Spoke with patient this am  Patient is aware of her pap results (positive for BV) [facility]

## 2014-12-15 ENCOUNTER — Telehealth: Payer: Self-pay | Admitting: Internal Medicine

## 2014-12-15 DIAGNOSIS — B9689 Other specified bacterial agents as the cause of diseases classified elsewhere: Secondary | ICD-10-CM

## 2014-12-15 DIAGNOSIS — N760 Acute vaginitis: Principal | ICD-10-CM

## 2014-12-15 MED ORDER — METRONIDAZOLE 500 MG PO TABS: 500.0000 mg | ORAL_TABLET | Freq: Two times a day (BID) | ORAL | Status: DC
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
## 2014-12-15 NOTE — Telephone Encounter (Signed)
Nurse called patient, reached voicemail. Left message for patient to call Taima Rada with [facility], at [phone]. Nurse called to speak to patient about flagyl prescription.

## 2014-12-22 NOTE — Telephone Encounter (Signed)
error 

## 2015-01-02 NOTE — L&D Delivery Note (Signed)
Delivery Note Pt pushed with varying degrees of effort for over two hours for delivery.  At 6:54 AM a viable and healthy female was delivered via Vaginal, Spontaneous Delivery (Presentation: ROP; ROT ).  APGAR: 3, 8; weight 7#8 .   Placenta status: delivered, intact .  Cord: 3V  with the following complications: none.  Pt with terminal meconium.    Anesthesia:  epidural Episiotomy: None Lacerations: None Suture Repair: none Est. Blood Loss (mL): 250cc  Mom to postpartum.  Baby to Couplet care / Skin to Skin.  Bovard-Stuckert, Melissa [name] 09/22/2015, 7:33 AM   Br/O neg/RI/Tdap declined in PNC/Contra - IUD

## 2015-02-02 ENCOUNTER — Ambulatory Visit: Payer: Medicare Other | Attending: Internal Medicine | Admitting: Internal Medicine

## 2015-02-02 ENCOUNTER — Encounter: Payer: Self-pay | Admitting: Internal Medicine

## 2015-02-02 ENCOUNTER — Other Ambulatory Visit (HOSPITAL_COMMUNITY)
Admission: RE | Admit: 2015-02-02 | Discharge: 2015-02-02 | Disposition: A | Discharge status: Home / Self Care | Payer: Medicare Other | Source: Ambulatory Visit | Attending: Internal Medicine | Admitting: Internal Medicine

## 2015-02-02 VITALS — BP 116/78 | HR 81 | Temp 98.0°F | Resp 16 | Ht 61.0 in | Wt 165.2 lb

## 2015-02-02 DIAGNOSIS — Z3490 Encounter for supervision of normal pregnancy, unspecified, unspecified trimester: Secondary | ICD-10-CM

## 2015-02-02 DIAGNOSIS — Z113 Encounter for screening for infections with a predominantly sexual mode of transmission: Secondary | ICD-10-CM | POA: Insufficient documentation

## 2015-02-02 DIAGNOSIS — N760 Acute vaginitis: Secondary | ICD-10-CM | POA: Insufficient documentation

## 2015-02-02 DIAGNOSIS — N926 Irregular menstruation, unspecified: Secondary | ICD-10-CM

## 2015-02-02 DIAGNOSIS — O09521 Supervision of elderly multigravida, first trimester: Secondary | ICD-10-CM

## 2015-02-02 DIAGNOSIS — N898 Other specified noninflammatory disorders of vagina: Secondary | ICD-10-CM

## 2015-02-02 DIAGNOSIS — N9489 Other specified conditions associated with female genital organs and menstrual cycle: Secondary | ICD-10-CM

## 2015-02-02 DIAGNOSIS — Z331 Pregnant state, incidental: Secondary | ICD-10-CM

## 2015-02-02 LAB — POCT URINALYSIS DIPSTICK
Bilirubin, UA: NEGATIVE
Glucose, UA: NEGATIVE
Ketones, UA: NEGATIVE
Leukocytes, UA: NEGATIVE
Nitrite, UA: NEGATIVE
Protein, UA: NEGATIVE
Spec Grav, UA: 1.025
Urobilinogen, UA: 0.2
pH, UA: 5.5

## 2015-02-02 LAB — OB RESULTS CONSOLE GC/CHLAMYDIA: Gonorrhea: NEGATIVE

## 2015-02-02 LAB — POCT URINE PREGNANCY: Preg Test, Ur: POSITIVE — AB

## 2015-02-02 MED ORDER — METRONIDAZOLE 0.75 % VA GEL: 1.0000 | Freq: Two times a day (BID) | VAGINAL | Status: DC

## 2015-02-02 MED ORDER — PRENATAL VITAMINS 28-0.8 MG PO TABS: 1.0000 | ORAL_TABLET | Freq: Every day | ORAL | Status: DC

## 2015-02-02 MED FILL — VANDAZOLE VAGINAL 0.75% GEL: 0.75 | 7 days supply | Qty: 70 | Fill #0

## 2015-02-02 NOTE — Progress Notes (Signed)
Patient complains of having a vaginal odor for the past two weeks Patient also has missed a period Her last period was DEC 13th

## 2015-02-02 NOTE — Progress Notes (Signed)
Patient ID: Melissa [name], female   DOB: 12/19/1975, 40 y.o.   MRN: [id number]  CC: missed periods  HPI: Melissa [name] is a 40 y.o. female here today for a follow up visit.  Patient has past medical history of ADHD. Patient reports that she has not had her period this month. She has not tested herself for pregnancy. LMP was 12/14/14. She states that she has had several miscarriages, ectopic pregnancies, and one IUGR.  She complains of vaginal discharge and odor for the past week. White thin discharge. No new sexual partners..  No Known Allergies Past Medical History  Diagnosis Date  . Hammer toe 04/2013    bilateral 4th and 5th   . Ingrown left big toenail 04/2013  . UTI (lower urinary tract infection)    Current Outpatient Prescriptions on File Prior to Visit  Medication Sig Dispense Refill  . cholecalciferol (VITAMIN D) 1000 UNITS tablet Take 2,000 Units by mouth daily.    . fluconazole (DIFLUCAN) 150 MG tablet Take 1 tablet (150 mg total) by mouth once. May repeat in 1 week if no improvement 1 tablet 1  . gabapentin (NEURONTIN) 100 MG capsule Take one at bedtime for 5 days then increase to 3 tabs at night 90 capsule 1  . metroNIDAZOLE (FLAGYL) 500 MG tablet Take 1 tablet (500 mg total) by mouth 2 (two) times daily. 14 tablet 0  . Multiple Vitamin (MULTIVITAMIN) tablet Take 1 tablet by mouth daily.    . traMADol (ULTRAM) 50 MG tablet Take 1 tablet (50 mg total) by mouth every 8 (eight) hours as needed. 60 tablet 0   No current facility-administered medications on file prior to visit.   Family History  Problem Relation Age of Onset  . Cancer Father   . Birth defects Paternal Grandfather    Social History   Social History  . Marital Status: Single    Spouse Name: N/A  . Number of Children: N/A  . Years of Education: N/A   Occupational History  . Not on file.   Social History Main Topics  . Smoking status: Never Smoker   . Smokeless tobacco: Never Used  . Alcohol Use:  Yes     Comment: occasionally  . Drug Use: No  . Sexual Activity: Yes    Birth Control/ Protection: None   Other Topics Concern  . Not on file   Social History Narrative    Review of Systems: Other than what is stated in HPI, all other systems are negative.   Objective:   Filed Vitals:   02/02/15 1439  BP: 116/78  Pulse: 81  Temp: 98 F (36.7 C)  Resp: 16    Physical Exam  Genitourinary: Uterus normal. Cervix exhibits no motion tenderness, no discharge and no friability. Right adnexum displays no tenderness. Left adnexum displays no tenderness. Vaginal discharge (white, thin) found.  Lymphadenopathy:       Right: No inguinal adenopathy present.       Left: No inguinal adenopathy present.     Lab Results  Component Value Date   WBC 7.3 02/19/2014   HGB 14.1 02/19/2014   HCT 41.6 02/19/2014   MCV 89.1 02/19/2014   PLT 358 02/19/2014   Lab Results  Component Value Date   CREATININE 0.67 02/19/2014   BUN 11 02/19/2014   NA 140 02/19/2014   K 4.9 02/19/2014   CL 103 02/19/2014   CO2 25 02/19/2014    Lab Results  Component Value Date   HGBA1C  5.8* 01/27/2014   Lipid Panel     Component Value Date/Time   CHOL 176 02/19/2014 1300   TRIG 64 02/19/2014 1300   HDL 43 02/19/2014 1300   CHOLHDL 4.1 02/19/2014 1300   VLDL 13 02/19/2014 1300   LDLCALC 120* 02/19/2014 1300       Assessment and plan:  Melissa [name] was seen today for vaginal odor.  Diagnoses and all orders for this visit:  Missed period -     POCT urine pregnancy  Pregnant -     Ambulatory referral to Obstetrics / Gynecology -     Prenatal Vit-Fe Fumarate-FA (PRENATAL VITAMINS) 28-0.8 MG TABS; Take 1 tablet by mouth daily. Patient is high risk because of several miscarries and advanced maternal age. I will send referral urgent for assessment.  She will stop taking Adderall.   Vaginal odor -     Urinalysis Dipstick -     Cervicovaginal ancillary only -     metroNIDAZOLE (METROGEL VAGINAL)  0.75 % vaginal gel; Place 1 Applicatorful vaginally 2 (two) times daily.  AMA (advanced maternal age) multigravida 30+, first trimester See above   Return if symptoms worsen or fail to improve.       Lance Bosch, [facility] [phone] 02/02/2015, 2:46 PM

## 2015-02-02 NOTE — Patient Instructions (Addendum)
GO to [facility] and see if they can give you something approved in pregnancy but for now stop Adderall   I have sent Metrogel and prenatal vitamins to pharmacy  You may ask the OB about probiotics when you go for appointment.

## 2015-02-03 ENCOUNTER — Other Ambulatory Visit: Payer: Self-pay | Admitting: Internal Medicine

## 2015-02-03 LAB — CERVICOVAGINAL ANCILLARY ONLY
Chlamydia: NEGATIVE
Neisseria Gonorrhea: NEGATIVE
Wet Prep (BD Affirm): POSITIVE — AB

## 2015-02-04 ENCOUNTER — Telehealth: Payer: Self-pay

## 2015-02-04 NOTE — Telephone Encounter (Signed)
Tried to call patient  Patient not available Message left on voice mail to return our call

## 2015-02-04 NOTE — Telephone Encounter (Signed)
-----   Message from Lance Bosch, NP sent at 02/04/2015  1:03 PM EST ----- When patient completes MetroGel for bacterial vaginosis, she will need to to get some OTC Monistat for yeast. She was positive for BV and yeast

## 2015-02-07 ENCOUNTER — Telehealth: Payer: Self-pay

## 2015-02-07 NOTE — Telephone Encounter (Signed)
Returned patient phone call and patient is aware of her results and  To pick up  Monistat cream for her yeast

## 2015-02-14 ENCOUNTER — Other Ambulatory Visit: Payer: Self-pay

## 2015-02-14 DIAGNOSIS — Z3490 Encounter for supervision of normal pregnancy, unspecified, unspecified trimester: Secondary | ICD-10-CM

## 2015-02-14 NOTE — Progress Notes (Signed)
Scheduled OB US for viability on 02/21/15 @ 1400.  Per Dr. Harolyn Rutherford, advised to schedule [place] prior to NEW OB visit.

## 2015-02-15 ENCOUNTER — Encounter: Payer: Self-pay | Admitting: Advanced Practice Midwife

## 2015-02-16 ENCOUNTER — Telehealth: Payer: Self-pay | Admitting: *Deleted

## 2015-02-16 NOTE — Telephone Encounter (Signed)
ok 

## 2015-02-16 NOTE — Telephone Encounter (Addendum)
Pt states she has an appt tomorrow, and may be pregnant, would it be okay to get a cortisone injection.  I called pt and she states her ob/gyn doctor said it would be okay.  I told pt to ask the ob/gyn to fax a note stating so to my fax machine [phone].  Pt agreed.  02/17/2015-FAXED CLEARANCE NOTE FOR cortisone injection given to Dr. Milinda Pointer.

## 2015-02-16 NOTE — Telephone Encounter (Signed)
Please advise 

## 2015-02-17 ENCOUNTER — Ambulatory Visit (INDEPENDENT_AMBULATORY_CARE_PROVIDER_SITE_OTHER): Payer: Medicare Other | Admitting: Podiatry

## 2015-02-17 ENCOUNTER — Encounter: Payer: Self-pay | Admitting: Podiatry

## 2015-02-17 VITALS — BP 103/55 | HR 76 | Resp 16

## 2015-02-17 DIAGNOSIS — M2041 Other hammer toe(s) (acquired), right foot: Secondary | ICD-10-CM

## 2015-02-17 DIAGNOSIS — M7751 Other enthesopathy of right foot: Secondary | ICD-10-CM | POA: Diagnosis not present

## 2015-02-18 LAB — OB RESULTS CONSOLE HEPATITIS B SURFACE ANTIGEN: Hepatitis B Surface Ag: NEGATIVE

## 2015-02-19 NOTE — Progress Notes (Signed)
She presents today with a postsurgical fifth digit of the right foot which has been approximately a year ago now that this surgery was performed she states the toe is red and swollen and painful and needs another injection. She had a previous injection from Dr. Blenda Mounts.  Objective: Vital signs are stable she is alert and oriented 3 painful fifth hammertoe deformity right foot. Swollen and red.  Assessment: Capsulitis neuritis fifth digit right foot.  Plan: I injected the area today with dexamethasone and local anesthetic she will follow up with me as needed. We obtained prior authorization from her obstetrician since she is pregnant.

## 2015-02-21 ENCOUNTER — Ambulatory Visit (HOSPITAL_COMMUNITY): Admission: RE | Admit: 2015-02-21 | Payer: Medicare Other | Source: Ambulatory Visit

## 2015-02-21 DIAGNOSIS — O09529 Supervision of elderly multigravida, unspecified trimester: Secondary | ICD-10-CM | POA: Insufficient documentation

## 2015-02-21 DIAGNOSIS — O09299 Supervision of pregnancy with other poor reproductive or obstetric history, unspecified trimester: Secondary | ICD-10-CM | POA: Insufficient documentation

## 2015-03-09 ENCOUNTER — Encounter: Payer: Self-pay | Admitting: Family Medicine

## 2015-03-09 ENCOUNTER — Encounter: Payer: Self-pay | Admitting: Advanced Practice Midwife

## 2015-03-10 ENCOUNTER — Encounter: Payer: Self-pay | Admitting: Podiatry

## 2015-03-10 ENCOUNTER — Ambulatory Visit (INDEPENDENT_AMBULATORY_CARE_PROVIDER_SITE_OTHER): Payer: Medicare Other | Admitting: Podiatry

## 2015-03-10 VITALS — BP 111/64 | HR 86 | Resp 12

## 2015-03-10 DIAGNOSIS — M2041 Other hammer toe(s) (acquired), right foot: Secondary | ICD-10-CM | POA: Diagnosis not present

## 2015-03-10 DIAGNOSIS — M7751 Other enthesopathy of right foot: Secondary | ICD-10-CM | POA: Diagnosis not present

## 2015-03-13 NOTE — Progress Notes (Signed)
She presents today complaining of a painful hammertoe deformity fifth right. She states that the last injection didn't last very long.  Objective: Vital signs are stable she is alert and oriented 3 has severe pain on palpation fifth digit right foot.  Assessment: Status post arthroplasty fifth digit right foot severe pain right.  Plan: Reinjected today with dexamethasone and will follow up with her as needed. Currently she is pregnant and we will have to wait until after she delivers for surgery.

## 2015-04-21 ENCOUNTER — Encounter (INDEPENDENT_AMBULATORY_CARE_PROVIDER_SITE_OTHER): Payer: Medicare Other | Admitting: Podiatry

## 2015-04-21 DIAGNOSIS — M7751 Other enthesopathy of right foot: Secondary | ICD-10-CM

## 2015-04-21 DIAGNOSIS — M2041 Other hammer toe(s) (acquired), right foot: Secondary | ICD-10-CM

## 2015-04-21 NOTE — Progress Notes (Signed)
This encounter was created in error - please disregard.

## 2015-05-05 ENCOUNTER — Ambulatory Visit: Payer: Medicare Other | Admitting: Podiatry

## 2015-05-17 ENCOUNTER — Ambulatory Visit: Payer: Medicare Other | Admitting: Podiatry

## 2015-05-23 ENCOUNTER — Ambulatory Visit: Payer: Medicare Other | Admitting: *Deleted

## 2015-06-14 ENCOUNTER — Ambulatory Visit: Payer: Medicare Other | Attending: Obstetrics and Gynecology | Admitting: Occupational Therapy

## 2015-06-14 DIAGNOSIS — M6281 Muscle weakness (generalized): Secondary | ICD-10-CM

## 2015-06-14 DIAGNOSIS — M79642 Pain in left hand: Secondary | ICD-10-CM | POA: Diagnosis present

## 2015-06-14 DIAGNOSIS — M79641 Pain in right hand: Secondary | ICD-10-CM

## 2015-06-14 DIAGNOSIS — M25531 Pain in right wrist: Secondary | ICD-10-CM | POA: Diagnosis present

## 2015-06-14 DIAGNOSIS — M25532 Pain in left wrist: Secondary | ICD-10-CM

## 2015-06-14 NOTE — Therapy (Signed)
[facility] [address] [place], [place], [address] Phone: [phone]   Fax:  [phone]  Occupational Therapy Evaluation  Patient Details  Name: Melissa [name] MRN: [id number] Date of Birth: Nov 11, 1975 Referring Provider: Dr. Melba Coon  Encounter Date: 06/14/2015      OT End of Session - 06/14/15 1145    Visit Number 1   Number of Visits 9   Date for OT Re-Evaluation 08/12/15   Authorization Type Medicare/ Medicaid   Authorization Time Period g-code needed- pt is initally only scheduled for 4 visits   Authorization - Visit Number 1   Authorization - Number of Visits 10   OT Start Time 1115  pt late   OT Stop Time 1145   OT Time Calculation (min) 30 min      Past Medical History  Diagnosis Date  . Hammer toe 04/2013    bilateral 4th and 5th   . Ingrown left big toenail 04/2013  . UTI (lower urinary tract infection)     Past Surgical History  Procedure Laterality Date  . Cryoablation  2004  . Wisdom tooth extraction    . Dilation and evacuation  12/21/2011    Procedure: DILATATION AND EVACUATION;  Surgeon: Terrance Mass, MD;  Location: [facility];  Service: Gynecology;  Laterality: N/A;  first trimester   . Cystoscopy with hydrodistension and biopsy  07/28/2004    with urethral dilatation  . Correction hammer toe Bilateral   . Toe surgery      ingrown toenail  . Hammer toe surgery Bilateral 04/20/2013    Procedure: BILATERAL HAMMER TOE REPAIR AND EXCISION NAIL PERMANENT LEFT FIRST;  Surgeon: Harriet Masson, DPM;  Location: [facility];  Service: Podiatry;  Laterality: Bilateral;  Hammer toe repair fourth and fifth toes bilateral feet with screw placement fourth toes and partial nail excision left great toe    There were no vitals filed for this visit.      Subjective Assessment - 06/14/15 1259    Subjective  Pt reports bilateral carpal tunnel with numbness in hands  for several months during her pregnancy   Pertinent History see Epic   Patient Stated Goals pain relief, hand function   Pain Score 8    Pain Location Hand  wrist   Pain Orientation Right;Left   Pain Descriptors / Indicators Aching   Pain Type Neuropathic pain   Pain Onset More than a month ago   Pain Frequency Constant   Aggravating Factors  sleeping   Pain Relieving Factors walking/standing           [facility] OT Assessment - 06/14/15 0001    Assessment   Diagnosis bilateral carpal tunnel with pregnancy   Referring Provider Dr. Melba Coon   Onset Date 05/09/15  referral date   Prior Therapy no   Balance Screen   Has the patient fallen in the past 6 months No   Has the patient had a decrease in activity level because of a fear of falling?  No   Is the patient reluctant to leave their home because of a fear of falling?  No   Home  Environment   Family/patient expects to be discharged to: Private residence   Living Arrangements Other relatives   Lives With Family   Prior Function   Level of Independence Independent   Vocation On disability   ADL   ADL comments Pt reports she is modified independent with all basic ADLs. Pt  reports difficulty gripping and lifting items.   Written Expression   Dominant Hand Right   Sensation   Light Touch Impaired by gross assessment  fingertips of bilateral hands   Coordination   Fine Motor Movements are Fluid and Coordinated No   Edema   Edema moderate bilateral hands   ROM / Strength   AROM / PROM / Strength AROM   AROM   Overall AROM  Deficits  difficulty performing finger thumb opposition   Overall AROM Comments full composite finger flexion/ extension, difficulty performing finger thumb opposition  wrist flexion/ extension: left 50/50, right: 50/45   Hand Function   Right Hand Grip (lbs) 3 lbs   Left Hand Grip (lbs) 8 lbs                         OT Education - 06/14/15 1312    Education provided Yes   Education  Details splint wear/ care, inital HEP(conservative mgmt of CTS)   Person(s) Educated Patient   Methods Explanation;Demonstration;Verbal cues   Comprehension Verbalized understanding;Returned demonstration          OT Short Term Goals - 06/14/15 1315    OT SHORT TERM GOAL #1   Title I with splint wear, care and precautions. ck 07/13/15   Baseline dependent   Time 4   Period Weeks   Status New   OT SHORT TERM GOAL #2   Title I with inital HEP.   Baseline dependent   Time 4   Period Weeks   Status New   OT SHORT TERM GOAL #3   Title Pt will verbalize understanding of precautions related to sensory impairment.   Baseline dependent, reports burning herself   Time 4   Period Weeks   Status New   OT SHORT TERM GOAL #4   Title Pt will report bilateral pain less than or equal to 5/10 for functional use.   Baseline ppain 8/10   Time 4   Period Weeks   Status New           OT Long Term Goals - 06/14/15 1320    OT LONG TERM GOAL #1   Title I with updated HEP.-ck 08/12/15   Baseline dependent   Time 8   Period Weeks   Status New   OT LONG TERM GOAL #2   Title Pt will verbalize understanding of activity modification, positioning to minimize symptoms.   Baseline dependent   Time 8   Period Weeks   Status New   OT LONG TERM GOAL #3   Title Pt will increase bilateral grip strength by 8 lbs for increased bilateral functional use.   Baseline RUE 3 lbs, LUE 8 lbs   Time --   Period Weeks   Status New               Plan - 06/14/15 1304    Clinical Impression Statement Pt is pregnant and experiencing carpal tunnel in bilateral UE's with sensory impairment, pain, and decreased strength which limits bilateral UE functional use for ADLs/IADLs. Pt can benefit from skilled occupational therapy to maximiize functional use of bilateral UE's, minimize pain and increase functional indepenence.   Rehab Potential Good   OT Frequency 1x / week  8 visits plus eval   OT Duration 8  weeks  may discharge sooner dependent on progress   OT Treatment/Interventions Self-care/ADL training;Therapeutic exercise;Patient/family education;Splinting;Manual Therapy;Therapeutic exercises;Energy conservation;Parrafin;Cryotherapy;DME and/or AE instruction;Therapeutic activities;Fluidtherapy;Moist Heat;Contrast Bath;Passive range of  motion   Plan splint check, progress HEP, fluido   OT Home Exercise Plan issued conservative management of CTS, exercise 1, tendongliding, and finger abduct/adduct, d-ring braces   Consulted and Agree with Plan of Care Patient      Patient will benefit from skilled therapeutic intervention in order to improve the following deficits and impairments:  Decreased coordination, Impaired sensation, Increased edema, Decreased activity tolerance, Decreased knowledge of precautions, Pain, Impaired UE functional use, Decreased knowledge of use of DME, Decreased strength  Visit Diagnosis: Muscle weakness (generalized) - Plan: Ot plan of care cert/re-cert  Pain in left hand - Plan: Ot plan of care cert/re-cert  Pain in right hand - Plan: Ot plan of care cert/re-cert  Pain in left wrist - Plan: Ot plan of care cert/re-cert  Pain in right wrist - Plan: Ot plan of care cert/re-cert      G-Codes - [id number]    Functional Assessment Tool Used RUE grip strength 3 lbs, LUE grip strength 8 lbs, difficulty lifting objects, pain is 8/10   Functional Limitation Carrying, moving and handling objects   Carrying, Moving and Handling Objects Current Status [id number]) At least 40 percent but less than 60 percent impaired, limited or restricted   Carrying, Moving and Handling Objects Goal Status [id number]) At least 20 percent but less than 40 percent impaired, limited or restricted      Problem List Patient Active Problem List   Diagnosis Date Noted  . Radial tunnel syndrome 06/17/2014  . Cervical radiculopathy 06/10/2014  . Bilateral low back pain with right-sided sciatica  06/10/2014  . Bipolar depression ([place]) 05/17/2014  . Chronic night sweats 06/26/2013  . History of recurrent spontaneous abortion, not currently pregnant 01/26/2012  . Fluid retention 01/25/2012  . Weight gain 01/25/2012  . Bacterial vulvovaginitis 07/20/2011  . Ectopic pregnancy 02/07/2011  . Gonorrhea     Eyleen Rawlinson 06/14/2015, 1:34 PM Theone Murdoch, OTR/L [phone] Phone: [phone] 1:34 PM 06/14/2015 [facility] [address] [place], [place], [address] Phone: [phone]   Fax:  [phone]  Name: Melissa [name] MRN: [id number] Date of Birth: Nov 01, 1975

## 2015-09-21 ENCOUNTER — Inpatient Hospital Stay (HOSPITAL_COMMUNITY): Payer: Medicare Other | Admitting: Anesthesiology

## 2015-09-21 ENCOUNTER — Encounter (HOSPITAL_COMMUNITY): Payer: Self-pay | Admitting: Obstetrics and Gynecology

## 2015-09-21 ENCOUNTER — Inpatient Hospital Stay (HOSPITAL_COMMUNITY)
Admission: AD | Admit: 2015-09-21 | Discharge: 2015-09-24 | DRG: 775 | Disposition: A | Discharge status: Home / Self Care | Payer: Medicare Other | Source: Ambulatory Visit | Attending: Obstetrics and Gynecology | Admitting: Obstetrics and Gynecology

## 2015-09-21 ENCOUNTER — Inpatient Hospital Stay (HOSPITAL_COMMUNITY): Payer: Medicare Other

## 2015-09-21 DIAGNOSIS — O480 Post-term pregnancy: Secondary | ICD-10-CM | POA: Diagnosis present

## 2015-09-21 DIAGNOSIS — Z3A40 40 weeks gestation of pregnancy: Secondary | ICD-10-CM

## 2015-09-21 DIAGNOSIS — D649 Anemia, unspecified: Secondary | ICD-10-CM | POA: Diagnosis present

## 2015-09-21 DIAGNOSIS — O9902 Anemia complicating childbirth: Secondary | ICD-10-CM | POA: Diagnosis present

## 2015-09-21 DIAGNOSIS — O99824 Streptococcus B carrier state complicating childbirth: Secondary | ICD-10-CM | POA: Diagnosis present

## 2015-09-21 DIAGNOSIS — O4402 Placenta previa specified as without hemorrhage, second trimester: Secondary | ICD-10-CM

## 2015-09-21 DIAGNOSIS — Z3490 Encounter for supervision of normal pregnancy, unspecified, unspecified trimester: Secondary | ICD-10-CM

## 2015-09-21 HISTORY — DX: Anxiety disorder, unspecified: F41.9

## 2015-09-21 HISTORY — DX: Myoneural disorder, unspecified: G70.9

## 2015-09-21 HISTORY — DX: Depression, unspecified: F32.A

## 2015-09-21 HISTORY — DX: Unspecified osteoarthritis, unspecified site: M19.90

## 2015-09-21 HISTORY — DX: Carpal tunnel syndrome, unspecified upper limb: G56.00

## 2015-09-21 HISTORY — DX: Post-term pregnancy: O48.0

## 2015-09-21 HISTORY — DX: Mental disorder, not otherwise specified: F99

## 2015-09-21 HISTORY — DX: Major depressive disorder, single episode, unspecified: F32.9

## 2015-09-21 LAB — TYPE AND SCREEN
ABO/RH(D): O NEG
Antibody Screen: NEGATIVE

## 2015-09-21 LAB — CBC
HCT: 37.6 % (ref 36.0–46.0)
Hemoglobin: 13.6 g/dL (ref 12.0–15.0)
MCH: 31.6 pg (ref 26.0–34.0)
MCHC: 36.2 g/dL — ABNORMAL HIGH (ref 30.0–36.0)
MCV: 87.2 fL (ref 78.0–100.0)
Platelets: 262 10*3/uL (ref 150–400)
RBC: 4.31 MIL/uL (ref 3.87–5.11)
RDW: 14.8 % (ref 11.5–15.5)
WBC: 9.4 10*3/uL (ref 4.0–10.5)

## 2015-09-21 LAB — OB RESULTS CONSOLE RPR: RPR: NONREACTIVE

## 2015-09-21 LAB — OB RESULTS CONSOLE GBS: GBS: POSITIVE

## 2015-09-21 LAB — OB RESULTS CONSOLE HIV ANTIBODY (ROUTINE TESTING): HIV: NONREACTIVE

## 2015-09-21 LAB — OB RESULTS CONSOLE RUBELLA ANTIBODY, IGM: Rubella: IMMUNE

## 2015-09-21 LAB — OB RESULTS CONSOLE GC/CHLAMYDIA: Gonorrhea: NEGATIVE

## 2015-09-21 MED ORDER — LIDOCAINE HCL (PF) 1 % IJ SOLN
30.0000 mL | INTRAMUSCULAR | Status: DC | PRN
  Filled 2015-09-21: qty 30

## 2015-09-21 MED ORDER — LIDOCAINE HCL (PF) 1 % IJ SOLN
INTRAMUSCULAR | Status: DC | PRN
  Administered 2015-09-21 (×2): 7 mL via EPIDURAL

## 2015-09-21 MED ORDER — LACTATED RINGERS IV SOLN: 500.0000 mL | INTRAVENOUS | Status: DC | PRN

## 2015-09-21 MED ORDER — FENTANYL 2.5 MCG/ML BUPIVACAINE 1/10 % EPIDURAL INFUSION (WH - ANES)
14.0000 mL/h | INTRAMUSCULAR | Status: DC | PRN
  Administered 2015-09-21 – 2015-09-22 (×2): 14 mL/h via EPIDURAL
  Filled 2015-09-21 (×2): qty 125

## 2015-09-21 MED ORDER — EPHEDRINE 5 MG/ML INJ
10.0000 mg | INTRAVENOUS | Status: DC | PRN
  Filled 2015-09-21: qty 4

## 2015-09-21 MED ORDER — SOD CITRATE-CITRIC ACID 500-334 MG/5ML PO SOLN: 30.0000 mL | ORAL | Status: DC | PRN

## 2015-09-21 MED ORDER — PHENYLEPHRINE 40 MCG/ML (10ML) SYRINGE FOR IV PUSH (FOR BLOOD PRESSURE SUPPORT)
80.0000 ug | PREFILLED_SYRINGE | INTRAVENOUS | Status: DC | PRN
  Filled 2015-09-21: qty 5
  Filled 2015-09-21: qty 10

## 2015-09-21 MED ORDER — MISOPROSTOL 50MCG HALF TABLET
50.0000 ug | ORAL_TABLET | Freq: Once | ORAL | Status: AC
  Administered 2015-09-21: 50 ug via ORAL
  Filled 2015-09-21: qty 0.5

## 2015-09-21 MED ORDER — OXYCODONE-ACETAMINOPHEN 5-325 MG PO TABS: 1.0000 | ORAL_TABLET | ORAL | Status: DC | PRN

## 2015-09-21 MED ORDER — TERBUTALINE SULFATE 1 MG/ML IJ SOLN
0.2500 mg | Freq: Once | INTRAMUSCULAR | Status: DC | PRN
  Filled 2015-09-21: qty 1

## 2015-09-21 MED ORDER — LACTATED RINGERS IV SOLN
INTRAVENOUS | Status: DC
  Administered 2015-09-21 – 2015-09-22 (×2): via INTRAVENOUS

## 2015-09-21 MED ORDER — BUTORPHANOL TARTRATE 1 MG/ML IJ SOLN: 1.0000 mg | Freq: Once | INTRAMUSCULAR | Status: DC

## 2015-09-21 MED ORDER — OXYTOCIN 40 UNITS IN LACTATED RINGERS INFUSION - SIMPLE MED
1.0000 m[IU]/min | INTRAVENOUS | Status: DC
  Administered 2015-09-21: 2 m[IU]/min via INTRAVENOUS
  Filled 2015-09-21: qty 1000

## 2015-09-21 MED ORDER — PHENYLEPHRINE 40 MCG/ML (10ML) SYRINGE FOR IV PUSH (FOR BLOOD PRESSURE SUPPORT)
80.0000 ug | PREFILLED_SYRINGE | INTRAVENOUS | Status: DC | PRN
  Filled 2015-09-21: qty 5

## 2015-09-21 MED ORDER — OXYTOCIN 40 UNITS IN LACTATED RINGERS INFUSION - SIMPLE MED: 2.5000 [IU]/h | INTRAVENOUS | Status: DC

## 2015-09-21 MED ORDER — PENICILLIN G POTASSIUM 5000000 UNITS IJ SOLR
5.0000 10*6.[IU] | Freq: Once | INTRAVENOUS | Status: AC
  Administered 2015-09-21: 5 10*6.[IU] via INTRAVENOUS
  Filled 2015-09-21: qty 5

## 2015-09-21 MED ORDER — ACETAMINOPHEN 325 MG PO TABS
650.0000 mg | ORAL_TABLET | ORAL | Status: DC | PRN
  Administered 2015-09-21: 650 mg via ORAL
  Filled 2015-09-21: qty 2

## 2015-09-21 MED ORDER — ONDANSETRON HCL 4 MG/2ML IJ SOLN: 4.0000 mg | Freq: Four times a day (QID) | INTRAMUSCULAR | Status: DC | PRN

## 2015-09-21 MED ORDER — DIPHENHYDRAMINE HCL 50 MG/ML IJ SOLN
12.5000 mg | INTRAMUSCULAR | Status: DC | PRN
  Administered 2015-09-21 (×2): 12.5 mg via INTRAVENOUS
  Filled 2015-09-21 (×2): qty 1

## 2015-09-21 MED ORDER — OXYTOCIN BOLUS FROM INFUSION: 500.0000 mL | Freq: Once | INTRAVENOUS | Status: DC

## 2015-09-21 MED ORDER — DEXTROSE 5 % IV SOLN
2.5000 10*6.[IU] | INTRAVENOUS | Status: DC
  Administered 2015-09-21 – 2015-09-22 (×3): 2.5 10*6.[IU] via INTRAVENOUS
  Filled 2015-09-21 (×6): qty 2.5

## 2015-09-21 MED ORDER — OXYCODONE-ACETAMINOPHEN 5-325 MG PO TABS: 2.0000 | ORAL_TABLET | ORAL | Status: DC | PRN

## 2015-09-21 MED ORDER — LACTATED RINGERS IV SOLN
500.0000 mL | Freq: Once | INTRAVENOUS | Status: AC
  Administered 2015-09-21: 500 mL via INTRAVENOUS

## 2015-09-21 NOTE — Anesthesia Pain Management Evaluation Note (Signed)
  [facility] Pain Management Visit Note  Patient: Melissa [name], 40 y.o., female  "Hello I am a member of the anesthesia team at [facility]. We have an anesthesia team available at all times to provide care throughout the hospital, including epidural management and anesthesia for C-section. I don't know your plan for the delivery whether it a natural birth, water birth, IV sedation, nitrous supplementation, doula or epidural, but we want to meet your pain goals."   1.Was your pain managed to your expectations on prior hospitalizations?   No prior hospitalizations  2.What is your expectation for pain management during this hospitalization?     Labor support without medications  3.How can we help you reach that goal? unsure  Record the patient's initial score and the patient's pain goal.   Pain: 0  Pain Goal: 6 [facility] wants you to be able to say your pain was always managed very well.  Casimer Lanius 09/21/2015

## 2015-09-21 NOTE — H&P (Signed)
Melissa [name] is a 40 y.o. female [id number] at 74+ for IOL due to post-term and maternal discomfort.  +FM, no LOF, no VB, occ ctx; d/w process of IOL.  Will get [place] to verify previa has resolved.  Pregnancy complicated by AMA - nl panorama, low risk female.  Went to [name] in early pregnancy - negative Zika evaluation.    OB History    Gravida Para Term Preterm AB Living   4 0 0 0 4 0   SAB TAB Ectopic Multiple Live Births   2 0 2 0      G5P0050 TAB x1, SAB x 2, ectopic x2 - treated with MTX G6 present  No abn pap No STDs  Past Medical History:  Diagnosis Date  . Hammer toe 04/2013   bilateral 4th and 5th   . Ingrown left big toenail 04/2013  . Post term pregnancy over 40 weeks 09/21/2015  . UTI (lower urinary tract infection)   RA diagnosed as child - no problems Depression/anxiety  Past Surgical History:  Procedure Laterality Date  . CORRECTION HAMMER TOE Bilateral   . CRYOABLATION  2004  . CYSTOSCOPY WITH HYDRODISTENSION AND BIOPSY  07/28/2004   with urethral dilatation  . DILATION AND EVACUATION  12/21/2011   Procedure: DILATATION AND EVACUATION;  Surgeon: Melissa Mass, MD;  Location: [facility];  Service: Gynecology;  Laterality: N/A;  first trimester   . HAMMER TOE SURGERY Bilateral 04/20/2013   Procedure: BILATERAL HAMMER TOE REPAIR AND EXCISION NAIL PERMANENT LEFT FIRST;  Surgeon: Melissa [name], DPM;  Location: [name];  Service: Podiatry;  Laterality: Bilateral;  Hammer toe repair fourth and fifth toes bilateral feet with screw placement fourth toes and partial nail excision left great toe  . TOE SURGERY     ingrown toenail  . WISDOM TOOTH EXTRACTION     Family History: family history includes Birth defects in her paternal grandfather; Cancer in her father. Social History:  reports that she has never smoked. She has never used smokeless tobacco. She reports that she drinks alcohol. She reports that she does not use drugs.  single     Maternal Diabetes: No Genetic Screening: Normal Maternal Ultrasounds/Referrals: Normal Fetal Ultrasounds or other Referrals:  None Maternal Substance Abuse:  No Significant Maternal Medications:  None Significant Maternal Lab Results:  Lab values include: Group B Strep positive Other Comments:  None  Review of Systems  Constitutional: Negative.   HENT: Negative.   Eyes: Negative.   Respiratory: Negative.   Cardiovascular: Negative.   Gastrointestinal: Positive for abdominal pain.  Genitourinary: Negative.   Musculoskeletal: Positive for back pain.  Skin: Negative.   Neurological: Negative.   Psychiatric/Behavioral: Negative.    Maternal Medical History:  Contractions: Frequency: irregular.    Fetal activity: Perceived fetal activity is normal.    Prenatal Complications - Diabetes: none.      There were no vitals taken for this visit. Maternal Exam:  Uterine Assessment: Contraction frequency is irregular.   Abdomen: Patient reports no abdominal tenderness. Fundal height is appropriate for gestation.   Estimated fetal weight is 7#.   Fetal presentation: vertex  Introitus: Normal vulva. Normal vagina.    Physical Exam  Constitutional: She is oriented to person, place, and time. She appears well-developed and well-nourished.  HENT:  Head: Normocephalic and atraumatic.  Cardiovascular: Normal rate and regular rhythm.   Respiratory: Breath sounds normal. No respiratory distress. She has no wheezes.  GI: Soft. Bowel sounds  are normal. She exhibits no distension. There is no tenderness.  Musculoskeletal: Normal range of motion.  Neurological: She is alert and oriented to person, place, and time.  Skin: Skin is warm and dry.  Psychiatric: She has a normal mood and affect. Her behavior is normal.    Prenatal labs: ABO, Rh:  O neg Antibody:  neg Rubella:  immune RPR:   NR HBsAg:   neg HIV:   neg GBS:   positive  Hgb 13.9/Plt 299/Ur Cx neg/Chl neg/GC  neg/Varicella immune/ Hgb electro WNL/CF neg/glucola 137 - nl 3hr GTT/NT WNL/panorama low-risk female  Rhogam 6/27 Declined Tdap  Nl anat, ant previa, female  Assessment/Plan: 40yo G6P0050 at 40+ for IOL GBBS + misprostol po and pv/pitocin and AROM - after verification of resolution of previa   Bovard-Stuckert, Melissa [name] 09/21/2015, 1:44 PM

## 2015-09-21 NOTE — Anesthesia Preprocedure Evaluation (Signed)
Anesthesia Evaluation  Patient identified by MRN, date of birth, ID band Patient awake    Reviewed: Allergy & Precautions, H&P , NPO status , Patient's Chart, lab work & pertinent test results  Airway Mallampati: I  TM Distance: >3 FB Neck ROM: full    Dental no notable dental hx.    Pulmonary neg pulmonary ROS,    Pulmonary exam normal        Cardiovascular negative cardio ROS Normal cardiovascular exam     Neuro/Psych negative psych ROS   GI/Hepatic negative GI ROS, Neg liver ROS,   Endo/Other  negative endocrine ROS  Renal/GU negative Renal ROS     Musculoskeletal   Abdominal (+) + obese,   Peds  Hematology negative hematology ROS (+)   Anesthesia Other Findings   Reproductive/Obstetrics (+) Pregnancy                             Anesthesia Physical Anesthesia Plan  ASA: II  Anesthesia Plan: Epidural   Post-op Pain Management:    Induction:   Airway Management Planned:   Additional Equipment:   Intra-op Plan:   Post-operative Plan:   Informed Consent: I have reviewed the patients History and Physical, chart, labs and discussed the procedure including the risks, benefits and alternatives for the proposed anesthesia with the patient or authorized representative who has indicated his/her understanding and acceptance.     Plan Discussed with:   Anesthesia Plan Comments:         Anesthesia Quick Evaluation

## 2015-09-21 NOTE — Progress Notes (Signed)
Patient ID: SONG STEADY, female   DOB: 1975-10-17, 40 y.o.   MRN: [id number]   [place] confirms previa resolved.  Some ctx.  AFVSS gen NAD FHTs 150's, good var, category 1 toco irregular  SVE 1/90/0 -1  Foley bulb placed - 600cc When placing FB ?ROM - clear fluid  PCN for GBBS prophylaxis.    Continue current mgmt.

## 2015-09-21 NOTE — Anesthesia Procedure Notes (Signed)
Epidural Patient location during procedure: OB Start time: 09/21/2015 7:07 PM End time: 09/21/2015 7:11 PM  Staffing Anesthesiologist: Lyn Hollingshead Performed: anesthesiologist   Preanesthetic Checklist Completed: patient identified, site marked, surgical consent, pre-op evaluation, timeout performed, IV checked, risks and benefits discussed and monitors and equipment checked  Epidural Patient position: sitting Prep: site prepped and draped and DuraPrep Patient monitoring: continuous pulse ox and blood pressure Approach: midline Location: L3-L4 Injection technique: LOR air  Needle:  Needle type: Tuohy  Needle gauge: 17 G Needle length: 9 cm and 9 Needle insertion depth: 7 cm Catheter type: closed end flexible Catheter size: 19 Gauge Catheter at skin depth: 12 cm Test dose: negative and Other  Assessment Sensory level: T9 Events: blood not aspirated, injection not painful, no injection resistance, negative IV test and no paresthesia  Additional Notes Reason for block:procedure for pain

## 2015-09-22 ENCOUNTER — Encounter (HOSPITAL_COMMUNITY): Payer: Self-pay | Admitting: Obstetrics and Gynecology

## 2015-09-22 LAB — RPR: RPR Ser Ql: NONREACTIVE

## 2015-09-22 MED ORDER — SIMETHICONE 80 MG PO CHEW: 80.0000 mg | CHEWABLE_TABLET | ORAL | Status: DC | PRN

## 2015-09-22 MED ORDER — WITCH HAZEL-GLYCERIN EX PADS: 1.0000 "application " | MEDICATED_PAD | CUTANEOUS | Status: DC | PRN

## 2015-09-22 MED ORDER — COCONUT OIL OIL
1.0000 "application " | TOPICAL_OIL | Status: DC | PRN
  Filled 2015-09-22 (×2): qty 120

## 2015-09-22 MED ORDER — TETANUS-DIPHTH-ACELL PERTUSSIS 5-2.5-18.5 LF-MCG/0.5 IM SUSP
0.5000 mL | Freq: Once | INTRAMUSCULAR | Status: AC
  Administered 2015-09-23: 0.5 mL via INTRAMUSCULAR
  Filled 2015-09-22: qty 0.5

## 2015-09-22 MED ORDER — OXYTOCIN 10 UNIT/ML IJ SOLN
INTRAMUSCULAR | Status: AC
  Filled 2015-09-22: qty 1

## 2015-09-22 MED ORDER — IBUPROFEN 600 MG PO TABS
600.0000 mg | ORAL_TABLET | Freq: Four times a day (QID) | ORAL | Status: DC
  Administered 2015-09-22 – 2015-09-24 (×9): 600 mg via ORAL
  Filled 2015-09-22 (×9): qty 1

## 2015-09-22 MED ORDER — ONDANSETRON HCL 4 MG/2ML IJ SOLN: 4.0000 mg | INTRAMUSCULAR | Status: DC | PRN

## 2015-09-22 MED ORDER — ACETAMINOPHEN 325 MG PO TABS
650.0000 mg | ORAL_TABLET | ORAL | Status: DC | PRN
Start: 2015-09-22 — End: 2015-09-24
  Administered 2015-09-24: 650 mg via ORAL
  Filled 2015-09-22: qty 2

## 2015-09-22 MED ORDER — ZOLPIDEM TARTRATE 5 MG PO TABS: 5.0000 mg | ORAL_TABLET | Freq: Every evening | ORAL | Status: DC | PRN

## 2015-09-22 MED ORDER — LACTATED RINGERS IV SOLN: INTRAVENOUS | Status: DC

## 2015-09-22 MED ORDER — ONDANSETRON HCL 4 MG PO TABS: 4.0000 mg | ORAL_TABLET | ORAL | Status: DC | PRN

## 2015-09-22 MED ORDER — OXYCODONE HCL 5 MG PO TABS: 10.0000 mg | ORAL_TABLET | ORAL | Status: DC | PRN

## 2015-09-22 MED ORDER — DIPHENHYDRAMINE HCL 25 MG PO CAPS: 25.0000 mg | ORAL_CAPSULE | Freq: Four times a day (QID) | ORAL | Status: DC | PRN

## 2015-09-22 MED ORDER — BENZOCAINE-MENTHOL 20-0.5 % EX AERO
1.0000 "application " | INHALATION_SPRAY | CUTANEOUS | Status: DC | PRN
  Administered 2015-09-22 (×2): 1 via TOPICAL
  Filled 2015-09-22 (×2): qty 56

## 2015-09-22 MED ORDER — PRENATAL MULTIVITAMIN CH
1.0000 | ORAL_TABLET | Freq: Every day | ORAL | Status: DC
  Administered 2015-09-22 – 2015-09-24 (×3): 1 via ORAL
  Filled 2015-09-22 (×3): qty 1

## 2015-09-22 MED ORDER — SENNOSIDES-DOCUSATE SODIUM 8.6-50 MG PO TABS
2.0000 | ORAL_TABLET | ORAL | Status: DC
  Administered 2015-09-22 – 2015-09-23 (×2): 2 via ORAL
  Filled 2015-09-22 (×2): qty 2

## 2015-09-22 MED ORDER — OXYTOCIN 10 UNIT/ML IJ SOLN
10.0000 [IU] | Freq: Once | INTRAMUSCULAR | Status: AC
  Administered 2015-09-22: 10 [IU] via INTRAMUSCULAR

## 2015-09-22 MED ORDER — OXYCODONE HCL 5 MG PO TABS
5.0000 mg | ORAL_TABLET | ORAL | Status: DC | PRN
  Administered 2015-09-22 – 2015-09-23 (×4): 5 mg via ORAL
  Filled 2015-09-22 (×4): qty 1

## 2015-09-22 MED ORDER — DIBUCAINE 1 % RE OINT: 1.0000 "application " | TOPICAL_OINTMENT | RECTAL | Status: DC | PRN

## 2015-09-22 NOTE — Progress Notes (Signed)
Patient ID: Melissa [name], female   DOB: [birth year], 40 y.o.   MRN: [id number]   Pt comfortable with epidural - with pressure  AFVSS gen NAD FHTs 120-150's, good var, category 1-2 - some early and variable decels toco q 2-1min  Will push with nurse, expect SVD

## 2015-09-22 NOTE — Anesthesia Postprocedure Evaluation (Signed)
Anesthesia Post Note  Patient: Melissa [name]  Procedure(s) Performed: * No procedures listed *  Patient location during evaluation: Mother Baby Anesthesia Type: Epidural Level of consciousness: awake and alert and oriented Pain management: satisfactory to patient Vital Signs Assessment: post-procedure vital signs reviewed and stable Respiratory status: spontaneous breathing and nonlabored ventilation Cardiovascular status: stable Postop Assessment: no headache, no backache, no signs of nausea or vomiting, adequate PO intake and patient able to bend at knees (patient up walking) Anesthetic complications: no     Last Vitals:  Vitals:   09/22/15 0845 09/22/15 1017  BP: (!) 88/56 125/65  Pulse: (!) 111 (!) 115  Resp: 20 20  Temp: 37.2 C     Last Pain:  Vitals:   09/22/15 1400  TempSrc:   PainSc: 5    Pain Goal:                 Genevieve Arbaugh

## 2015-09-22 NOTE — Lactation Note (Signed)
This note was copied from a baby's chart. Lactation Consultation Note  Patient Name: Melissa [name] [id number] Date: 09/22/2015 Reason for consult: Initial assessment Breastfeeding services and support information given.  Baby is 5 hours and on mom's chest showing feeding cues.  Mom states she is worried baby isn't getting enough.  Baby has been nursing frequently.  Teaching done and assisted with positioning baby in football hold.  Hand expression taught but no colostrum visible.  Mom has wide spaced breasts.  Baby latched easily and nursed actively.  Few swallows heard.  Instructed on breast massage during feeding.  Encouraged to call for concerns/assist prn.  Maternal Data Has patient been taught Hand Expression?: Yes Does the patient have breastfeeding experience prior to this delivery?: No  Feeding Feeding Type: Breast Fed Length of feed: 20 min  LATCH Score/Interventions Latch: Grasps breast easily, tongue down, lips flanged, rhythmical sucking.  Audible Swallowing: A few with stimulation Intervention(s): Skin to skin;Hand expression;Alternate breast massage  Type of Nipple: Everted at rest and after stimulation (short)  Comfort (Breast/Nipple): Soft / non-tender     Hold (Positioning): Assistance needed to correctly position infant at breast and maintain latch. Intervention(s): Breastfeeding basics reviewed;Support Pillows;Position options;Skin to skin  LATCH Score: 8  Lactation Tools Discussed/Used     Consult Status Consult Status: Follow-up Date: 09/23/15 Follow-up type: In-patient    Ave Filter 09/22/2015, 12:09 PM

## 2015-09-22 NOTE — Lactation Note (Signed)
This note was copied from a baby's chart. Lactation Consultation Note  Patient Name: Melissa [name] [id number] Date: 09/22/2015 Reason for consult: Follow-up assessment Baby at 14 hr of life. Mom requested lactation because baby was fussy. Discussed baby behavior, feeding frequency, baby belly size, voids, and wt loss. Demonstrated manual expression, colostrum noted bilaterally, spoon in room. Mom is aware of lactation services and support group. She will call as needed.     Maternal Data    Feeding Feeding Type: Breast Fed Length of feed: 20 min  LATCH Score/Interventions Latch: Grasps breast easily, tongue down, lips flanged, rhythmical sucking. Intervention(s): Adjust position;Breast compression  Audible Swallowing: A few with stimulation Intervention(s): Hand expression;Skin to skin;Alternate breast massage  Type of Nipple: Everted at rest and after stimulation  Comfort (Breast/Nipple): Soft / non-tender     Hold (Positioning): Full assist, staff holds infant at breast Intervention(s): Support Pillows;Position options  LATCH Score: 7  Lactation Tools Discussed/Used     Consult Status Consult Status: Follow-up Date: 09/23/15 Follow-up type: In-patient    Denzil Hughes 09/22/2015, 10:07 PM

## 2015-09-23 LAB — CBC
HCT: 24.2 % — ABNORMAL LOW (ref 36.0–46.0)
Hemoglobin: 8.6 g/dL — ABNORMAL LOW (ref 12.0–15.0)
MCH: 31.7 pg (ref 26.0–34.0)
MCHC: 35.5 g/dL (ref 30.0–36.0)
MCV: 89.3 fL (ref 78.0–100.0)
Platelets: 204 10*3/uL (ref 150–400)
RBC: 2.71 MIL/uL — ABNORMAL LOW (ref 3.87–5.11)
RDW: 15.9 % — ABNORMAL HIGH (ref 11.5–15.5)
WBC: 16.3 10*3/uL — ABNORMAL HIGH (ref 4.0–10.5)

## 2015-09-23 LAB — KLEIHAUER-BETKE STAIN
# Vials RhIg: 1
Fetal Cells %: 0 %
Quantitation Fetal Hemoglobin: 0 mL

## 2015-09-23 MED ORDER — RHO D IMMUNE GLOBULIN 1500 UNIT/2ML IJ SOSY
300.0000 ug | PREFILLED_SYRINGE | Freq: Once | INTRAMUSCULAR | Status: AC
  Administered 2015-09-23: 300 ug via INTRAMUSCULAR
  Filled 2015-09-23: qty 2

## 2015-09-23 MED ORDER — FERROUS SULFATE 325 (65 FE) MG PO TABS
325.0000 mg | ORAL_TABLET | Freq: Every day | ORAL | Status: DC
  Administered 2015-09-24: 325 mg via ORAL
  Filled 2015-09-23: qty 1

## 2015-09-23 NOTE — Progress Notes (Signed)
Post Partum Day 1 Subjective: no complaints, up ad lib, voiding, tolerating PO, + flatus and bonding well with baby - breastfeeding Reports mild lochia. Pain controlled  Objective: Blood pressure (!) 101/47, pulse 73, temperature 98.3 F (36.8 C), temperature source Oral, resp. rate 18, unknown if currently breastfeeding.  Physical Exam:  General: alert, cooperative and no distress Lochia: appropriate Uterine Fundus: firm Incision: n/a DVT Evaluation: No evidence of DVT seen on physical exam.   Recent Labs  09/21/15 1445 09/23/15 0523  HGB 13.6 8.6*  HCT 37.6 24.2*    Assessment/Plan: Plan for discharge tomorrow and Breastfeeding  Routine pp care    LOS: 2 days   Sherlyn Hay 09/23/2015, 9:39 AM

## 2015-09-23 NOTE — Lactation Note (Signed)
This note was copied from a baby's chart. Lactation Consultation Note Follow up visit at 25 hours of age.  Mom reports having nipple soreness.  Nipple appears intact, LC instructed mom on proper hand expression with several drops expressed.  LC encouraged mom to hand express prior to latching and apply to nipples after feedings.  [place] assisted with latch on left breast in cross cradle hold.  Mom needed much support with holding baby.  Several attempt to get a deep wide latch.  Mom reports intense pain with latch on and resolved quickly with feeding.  [place] assisted with breast compression, pillow support.  Baby has not had a stool since delivery and has had 8 voids in past 24 hours.  Mom to call for assist as needed.    Patient Name: Girl Annise Hundertmark [id number] Date: 09/23/2015 Reason for consult: Follow-up assessment;Breast/nipple pain   Maternal Data    Feeding Feeding Type: Breast Fed Length of feed:  (several minutes observed)  LATCH Score/Interventions Latch: Repeated attempts needed to sustain latch, nipple held in mouth throughout feeding, stimulation needed to elicit sucking reflex. Intervention(s): Adjust position;Assist with latch;Breast massage;Breast compression  Audible Swallowing: A few with stimulation Intervention(s): Skin to skin;Hand expression;Alternate breast massage  Type of Nipple: Everted at rest and after stimulation (short semi compressible)  Comfort (Breast/Nipple): Filling, red/small blisters or bruises, mild/mod discomfort     Hold (Positioning): Assistance needed to correctly position infant at breast and maintain latch. Intervention(s): Breastfeeding basics reviewed;Support Pillows;Position options;Skin to skin  LATCH Score: 6  Lactation Tools Discussed/Used     Consult Status Consult Status: Follow-up Date: 09/24/15 Follow-up type: In-patient    [place], Justine Null 09/23/2015, 10:31 PM

## 2015-09-23 NOTE — Clinical Social Work Maternal (Signed)
  CLINICAL SOCIAL WORK MATERNAL/CHILD NOTE  Patient Details  Name: Melissa [name] MRN: [id number] Date of Birth: 1975/04/05  Date:  09/23/2015  Clinical Social Worker Initiating Note:  Glennie Hawk Date/ Time Initiated:  09/23/15/1439     Child's Name:  Melissa [name]   Legal Guardian:  Mother   Need for Interpreter:  None   Date of Referral:  09/23/15     Reason for Referral:  Behavioral Health Issues, including SI    Referral Source:  [facility]   Address:  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 [name] was guarded and communicated with CSW that she did not want to meet.  CSW explained to [name] that CSW has to complete an assessment for any mother that has a mental health hx. [name] was understanding and was engaged with CSW. [name] acknowledged [name]'s dx of bipolar and informed CSW that [name] receives outpatient therapy at the [facility]. [name] reports [name] next scheduled appointment is in October, however, [name] was not certain about the day. [name] also reported that [name] was on medication  (medications are unknown) prior to preganncy confirmation. When [name] confirmed pregancy [name] no longer took  medications and utilized learned coping skills. CSW educated [name] about PPD. CSW informed [name] of possible supports and interventions to decrease PPD.  CSW also encouraged [name] to seek medical attention if needed for increased signs and symptoms of PPD. CSW assessed for other needs including resources for baby and education.  [name] was very receptive to assessment, but denies all needs.  [name] reports strong support at home.  There are no other concerns or needs at this time. [name] aware of how to reach CSW if needs arise and will follow up if needed. No barriers to DC.  No formal referrals requested or warranted at this time.   CSW Plan/Description:  Patient/Family Education , No Further Intervention Required/No Barriers to Discharge, Information/Referral to [place], MSW, [employer] Social Work [phone]   Dimple Nanas, LCSW 09/23/2015, 2:46 PM

## 2015-09-24 ENCOUNTER — Ambulatory Visit: Payer: Self-pay

## 2015-09-24 LAB — RH IG WORKUP (INCLUDES ABO/RH)
ABO/RH(D): O NEG
Gestational Age(Wks): 40
Unit division: 0

## 2015-09-24 MED ORDER — FERROUS SULFATE 325 (65 FE) MG PO TABS: 325.0000 mg | ORAL_TABLET | Freq: Every day | ORAL | 3 refills | Status: DC

## 2015-09-24 MED ORDER — IBUPROFEN 800 MG PO TABS: 800.0000 mg | ORAL_TABLET | Freq: Three times a day (TID) | ORAL | 1 refills | Status: DC | PRN

## 2015-09-24 NOTE — Progress Notes (Signed)
Patient ID: Melissa [name], female   DOB: October 18, 1975, 40 y.o.   MRN: [id number] Pt doing well. Bonding well with baby; breastfeeding. Denies any fver, chills or SOB. Lochia mild.Pain well controlled. Ready for discharge to home today VSS ABD- soft FF EXT - no [place]  A/P: Discharge to home today         Instructions reviewed - rx for iron and ibuprofen; f/u in 4-5weeks         Desires iud for bc

## 2015-09-24 NOTE — Discharge Instructions (Signed)
Nothing in vagina for 6 weeks.  No sex, tampons, and douching.  Other instructions as in [facility] Discharge Booklet. ° °Iron-Rich Diet ° °Iron is a mineral that helps your body to produce hemoglobin. Hemoglobin is a protein in your red blood cells that carries oxygen to your body's tissues. Eating too little iron may cause you to feel weak and tired, and it can increase your risk for infection. Eating enough iron is necessary for your body's metabolism, muscle function, and nervous system. °Iron is naturally found in many foods. It can also be added to foods or fortified in foods. There are two types of dietary iron: °· Heme iron. Heme iron is absorbed by the body more easily than nonheme iron. Heme iron is found in meat, poultry, and fish. °· Nonheme iron. Nonheme iron is found in dietary supplements, iron-fortified grains, beans, and vegetables. °You may need to follow an iron-rich diet if: °· You have been diagnosed with iron deficiency or iron-deficiency anemia. °· You have a condition that prevents you from absorbing dietary iron, such as: °¨ Infection in your intestines. °¨ Celiac disease. This involves long-lasting (chronic) inflammation of your intestines. °· You do not eat enough iron. °· You eat a diet that is high in foods that impair iron absorption. °· You have lost a lot of blood. °· You have heavy bleeding during your menstrual cycle. °· You are pregnant. °WHAT IS MY PLAN? °Your health care provider may help you to determine how much iron you need per day based on your condition. Generally, when a person consumes sufficient amounts of iron in the diet, the following iron needs are met: °· Men. °¨ 14-18 years old: 11 mg per day. °¨ 19-50 years old: 8 mg per day. °· Women.   °¨ 14-18 years old: 15 mg per day. °¨ 19-50 years old: 18 mg per day. °¨ Over 50 years old: 8 mg per day. °¨ Pregnant women: 27 mg per day. °¨ Breastfeeding women: 9 mg per day. °WHAT DO I NEED TO KNOW ABOUT AN IRON-RICH  DIET? °· Eat fresh fruits and vegetables that are high in vitamin C along with foods that are high in iron. This will help increase the amount of iron that your body absorbs from food, especially with foods containing nonheme iron. Foods that are high in vitamin C include oranges, peppers, tomatoes, and mango. °· Take iron supplements only as directed by your health care provider. Overdose of iron can be life-threatening. If you were prescribed iron supplements, take them with orange juice or a vitamin C supplement. °· Cook foods in pots and pans that are made from iron.   °· Eat nonheme iron-containing foods alongside foods that are high in heme iron. This helps to improve your iron absorption.   °· Certain foods and drinks contain compounds that impair iron absorption. Avoid eating these foods in the same meal as iron-rich foods or with iron supplements. These include: °¨ Coffee, black tea, and red wine. °¨ Milk, dairy products, and foods that are high in calcium. °¨ Beans, soybeans, and peas. °¨ Whole grains. °· When eating foods that contain both nonheme iron and compounds that impair iron absorption, follow these tips to absorb iron better.   °¨ Soak beans overnight before cooking. °¨ Soak whole grains overnight and drain them before using. °¨ Ferment flours before baking, such as using yeast in bread dough. °WHAT FOODS CAN I EAT? °Grains  °Iron-fortified breakfast cereal. Iron-fortified whole-wheat bread. Enriched rice. Sprouted grains. °Vegetables  °Spinach. Potatoes   with skin. Green peas. Broccoli. Red and green bell peppers. Fermented vegetables. °Fruits  °Prunes. Raisins. Oranges. Strawberries. Mango. Grapefruit. °Meats and Other Protein Sources  °Beef liver. Oysters. Beef. Shrimp. Turkey. Chicken. Tuna. Sardines. Chickpeas. Nuts. Tofu. °Beverages  °Tomato juice. Fresh orange juice. Prune juice. Hibiscus tea. Fortified instant breakfast shakes. °Condiments  °Tahini. Fermented soy sauce.  °Sweets and  Desserts  °Black-strap molasses.  °Other  °Wheat germ. °The items listed above may not be a complete list of recommended foods or beverages. Contact your dietitian for more options.  °WHAT FOODS ARE NOT RECOMMENDED? °Grains  °Whole grains. Bran cereal. Bran flour. Oats. °Vegetables  °Artichokes. Brussels sprouts. Kale. °Fruits  °Blueberries. Raspberries. Strawberries. Figs. °Meats and Other Protein Sources  °Soybeans. Products made from soy protein. °Dairy  °Milk. Cream. Cheese. Yogurt. Cottage cheese. °Beverages  °Coffee. Black tea. Red wine. °Sweets and Desserts  °Cocoa. Chocolate. Ice cream. °Other  °Basil. Oregano. Parsley. °The items listed above may not be a complete list of foods and beverages to avoid. Contact your dietitian for more information.  °  °This information is not intended to replace advice given to you by your health care provider. Make sure you discuss any questions you have with your health care provider. °  °Document Released: 08/01/2004 Document Revised: 01/08/2014 Document Reviewed: 07/15/2013 °Elsevier Interactive Patient Education ©2016 Elsevier Inc. ° ° ° ° °

## 2015-09-24 NOTE — Discharge Summary (Signed)
OB Discharge Summary     Patient Name: Melissa [name] DOB: Feb 03, 1975 MRN: [id number]  Date of admission: 09/21/2015 Delivering MD: Janyth Contes   Date of discharge: 09/24/2015  Admitting diagnosis: INDUCTION Intrauterine pregnancy: [redacted]w[redacted]d     Secondary diagnosis:  Principal Problem:   SVD (spontaneous vaginal delivery) Active Problems:   Post term pregnancy over 40 weeks   Term pregnancy  Additional problems: anemia      Discharge diagnosis: Term Pregnancy Delivered                                                                                                Post partum procedures:none  Augmentation: AROM, Pitocin and Foley Balloon  Complications: None  Hospital course:  Induction of Labor With Vaginal Delivery   40 y.o. yo [id number] at [redacted]w[redacted]d was admitted to the hospital 09/21/2015 for induction of labor.  Indication for induction: Postdates.  Patient had an uncomplicated labor course as follows: Membrane Rupture Time/Date: 5:51 PM ,09/21/2015   Intrapartum Procedures: Episiotomy: None [1]                                         Lacerations:  None [1]  Patient had delivery of a Viable infant.  Information for the patient's newborn:  [place], Boye [id number]  Delivery Method: Vaginal, Spontaneous Delivery (Filed from Delivery Summary)   09/22/2015  Details of delivery can be found in separate delivery note.  Patient had a routine postpartum course. Patient is discharged home 09/24/15.   Physical exam Vitals:   09/22/15 2140 09/23/15 0553 09/23/15 1811 09/24/15 0644  BP: 128/74 (!) 101/47 132/76 116/63  Pulse: 99 73 93 80  Resp: 18 18 18 20   Temp: 98.3 F (36.8 C) 98.3 F (36.8 C)  98.3 F (36.8 C)  TempSrc: Oral Oral  Oral   General: alert, cooperative and no distress Lochia: appropriate Uterine Fundus: firm Incision: N/A DVT Evaluation: No evidence of DVT seen on physical exam. Labs: Lab Results  Component Value Date   WBC 16.3 (H)  09/23/2015   HGB 8.6 (L) 09/23/2015   HCT 24.2 (L) 09/23/2015   MCV 89.3 09/23/2015   PLT 204 09/23/2015   CMP Latest Ref Rng & Units 02/19/2014  Glucose 70 - 99 mg/dL 76  BUN 6 - 23 mg/dL 11  Creatinine 0.50 - 1.10 mg/dL 0.67  Sodium 135 - 145 mEq/L 140  Potassium 3.5 - 5.3 mEq/L 4.9  Chloride 96 - 112 mEq/L 103  CO2 19 - 32 mEq/L 25  Calcium 8.4 - 10.5 mg/dL 9.6  Total Protein 6.0 - 8.3 g/dL 7.7  Total Bilirubin 0.2 - 1.2 mg/dL 1.0  Alkaline Phos 39 - 117 U/L 60  AST 0 - 37 U/L 26  ALT 0 - 35 U/L 22    Discharge instruction: per After Visit Summary and "Baby and Me Booklet".  After visit meds:    Medication List    STOP taking these medications   metroNIDAZOLE 0.75 % vaginal  gel Commonly known as:  METROGEL VAGINAL     TAKE these medications   ferrous sulfate 325 (65 FE) MG tablet Take 1 tablet (325 mg total) by mouth daily with breakfast. Start taking on:  09/25/2015   gabapentin 100 MG capsule Commonly known as:  NEURONTIN Take one at bedtime for 5 days then increase to 3 tabs at night   ibuprofen 800 MG tablet Commonly known as:  ADVIL,MOTRIN Take 1 tablet (800 mg total) by mouth every 8 (eight) hours as needed for mild pain, moderate pain or cramping.   Prenatal Vitamins 28-0.8 MG Tabs Take 1 tablet by mouth daily.   traMADol 50 MG tablet Commonly known as:  ULTRAM Take 1 tablet (50 mg total) by mouth every 8 (eight) hours as needed.       Diet: routine diet  Activity: Advance as tolerated. Pelvic rest for 6 weeks.   Outpatient follow up:4weeks Follow up Appt:No future appointments. Follow up Visit:No Follow-up on file.  Postpartum contraception: IUD Mirena  Newborn Data: Live born female  Birth Weight: 7 lb 8.6 oz (3420 g) APGAR: 3, 8  Baby Feeding: Breast Disposition:home with mother   09/24/2015 Smith, DO

## 2015-09-24 NOTE — Lactation Note (Signed)
This note was copied from a baby's chart. Lactation Consultation Note Baby hasn't had stool since birth. Baby passing gas. Mom has "V" shaped breast very short shaft nipples, bruised, very tender, breast filling w/knots noted. ICE applied. Massage breast during BF, noticed softening.  Shells given, comfort gels given. Hand expression and breast massage encouraged and taught. Breast filling, hard to hand expression. Fitted nipples #16 NS. Latched baby in football hold. Using pillows for positioning. Baby BF great. Heard audible swallows. Educated newborn feeding behavior. Bruising to head. Educated on jaundice and BF.  Patient Name: Melissa [name] [id number] Date: 09/24/2015 Reason for consult: Follow-up assessment;Infant weight loss;Other (Comment) (no stool since birth)   Maternal Data    Feeding Feeding Type: Breast Fed Length of feed: 20 min (still BF)  LATCH Score/Interventions Latch: Repeated attempts needed to sustain latch, nipple held in mouth throughout feeding, stimulation needed to elicit sucking reflex. Intervention(s): Adjust position;Assist with latch;Breast massage;Breast compression  Audible Swallowing: Spontaneous and intermittent Intervention(s): Skin to skin;Hand expression;Alternate breast massage  Type of Nipple: Everted at rest and after stimulation (short shaft)  Comfort (Breast/Nipple): Engorged, cracked, bleeding, large blisters, severe discomfort Problem noted: Cracked, bleeding, blisters, bruises Intervention(s): Reverse pressure;Expressed breast milk to nipple;Double electric pump  Problem noted: Filling;Mild/Moderate discomfort Interventions (Filling): Massage;Firm support;Frequent nursing;Double electric pump Interventions (Mild/moderate discomfort): Comfort gels;Breast shields;Post-pump;Hand expression;Hand massage  Hold (Positioning): Assistance needed to correctly position infant at breast and maintain latch. Intervention(s): Breastfeeding basics  reviewed;Support Pillows;Position options;Skin to skin  LATCH Score: 6  Lactation Tools Discussed/Used Tools: Shells;Nipple Shields;Pump;Comfort gels Nipple shield size: 16 Shell Type: Inverted Breast pump type: Double-Electric Breast Pump Pump Review: Setup, frequency, and cleaning;Milk Storage Initiated by:: Allayne Stack RN IBCL[facility] Date initiated:: 09/24/15   Consult Status Consult Status: Follow-up Date: 09/24/15 Follow-up type: In-patient    Theodoro Kalata 09/24/2015, 4:01 PM

## 2015-09-25 ENCOUNTER — Ambulatory Visit: Payer: Self-pay

## 2015-09-25 NOTE — Lactation Note (Signed)
This note was copied from a baby's chart. Lactation Consultation Note  Patient Name: Melissa [name] Today's Date: 09/25/2015 Reason for consult: Follow-up assessment Infant is 76 hours old & seen by LC for follow-up assessment. Baby had not had a stool since birth but had one at 1045 today. Per previous LC-mom sometimes BF without nipple shield and pt stated she was sore afterwards. Baby was with mom in blanket when LC entered. Mom reports BF is going ok. Mom stated she is wearing the comfort gels currently and they are helping. Reinforced importance of BF on cue at least 8-12x in 24hrs. Mom stated she does not have a pump & was thinking she needed to be pumping. Showed mom how to convert the pump kit into a hand pump & encouraged mom to focus on BF on cue and to only pump afterwards if she is still feeling full and only pumping until comfortable. Reviewed BF pages in Baby & Me booklet; discussed BF positions, latch tips, prevention & treatment of engorgement & sore nipples, number of wet & dirty diapers, and milk storage guidelines. Mom stated she has WIC. Mom reports no questions at this time. Encouraged mom to ask for LC at next feeding to assess the latch & to continue using nipple shield while BF.  Maternal Data    Feeding Feeding Type: Breast Fed Length of feed: 30 min  LATCH Score/Interventions    Audible Swallowing: Spontaneous and intermittent  Type of Nipple: Everted at rest and after stimulation  Comfort (Breast/Nipple): Filling, red/small blisters or bruises, mild/mod discomfort Problem noted: Cracked, bleeding, blisters, bruises Intervention(s): Double electric pump  Interventions (Filling): Massage;Firm support;Frequent nursing;Double electric pump Interventions (Mild/moderate discomfort): Hand massage;Hand expression;Comfort gels;Breast shields;Post-pump  Hold (Positioning): No assistance needed to correctly position infant at breast.     Lactation Tools  Discussed/Used Tools: Shells;Pump Breast pump type: Double-Electric Breast Pump WIC Program: Yes   Consult Status Consult Status: Complete Date: 09/25/15 Follow-up type: In-patient     C  09/25/2015, 11:55 AM    

## 2015-09-25 NOTE — Lactation Note (Signed)
This note was copied from a baby's chart. Lactation Consultation Note Mom has been giving formula supplementing to help baby have a stool. Hasn't had a stool in 74 hours, had Mec at birth. Encouraged mom to give colostrum first!! Mom has a LOT of colostrum to give. There isn't really a need for formula. Educated mom on components of colostrum and has laxative properties and coats intestines. Mom breast filling w/knots. Encouraged to pump and give baby colostrum and relieve breast. Discussed again engorgement. MD assessing baby in nursery why hasn't had stool. Mom states baby passing gas.  Patient Name: Melissa [name] [id number] Date: 09/25/2015 Reason for consult: Follow-up assessment;Other (Comment)   Maternal Data    Feeding Feeding Type: Breast Fed Length of feed: 30 min  LATCH Score/Interventions    Audible Swallowing: Spontaneous and intermittent  Type of Nipple: Everted at rest and after stimulation  Comfort (Breast/Nipple): Filling, red/small blisters or bruises, mild/mod discomfort Problem noted: Cracked, bleeding, blisters, bruises Intervention(s): Double electric pump  Interventions (Filling): Massage;Firm support;Frequent nursing;Double electric pump Interventions (Mild/moderate discomfort): Hand massage;Hand expression;Comfort gels;Breast shields;Post-pump  Hold (Positioning): No assistance needed to correctly position infant at breast.     Lactation Tools Discussed/Used Tools: Shells;Pump Breast pump type: Double-Electric Breast Pump   Consult Status Consult Status: Follow-up Date: 09/25/15 Follow-up type: In-patient    Theodoro Kalata 09/25/2015, 10:37 AM

## 2015-10-13 ENCOUNTER — Telehealth (HOSPITAL_COMMUNITY): Payer: Self-pay | Admitting: Lactation Services

## 2015-10-13 NOTE — Telephone Encounter (Signed)
Lactation Telephone Call: patient called at 10:39 am and call returned at 12:05 pm. Patient chief complaint - plug - lump like area right breast lateral aspect . Started 2 weeks ago  And hasn't resolved . Per mom since this has occurred - baby doesn't seem to like that breast has much  And doesn't feed as long. Prior to 2 weeks ago baby was feeding both breast , switching between football  And side lying. Treating the lump ( plug ) at home , heat prior to with warm compress, warm shower, pre- pumping, And it hasn't resolved. Other recent S/S 's - temp 100.7, chills. And area being tender. No red strip or engorgement.  EBM yield is less on that breast compared to the other breast.  Per patient - Dr. Melba Coon recommended for me to call the [facility] office 1st  And after conversation to call office back.  Patient is already doing the recommended tx . LC also recommended to continue heat prior to latch - massage , hand express, pre- pump  3-5 mins , latch in football until improves , and at he same time set up the DEBP as a single pump on the opposite breast and pump until let down.  ( this may help to put presser behind the plugged duct on the right breast )  LC encouraged to call Dr. Jadene Pierini office for appt. Due to temp , and prolonged plug.

## 2015-11-03 ENCOUNTER — Other Ambulatory Visit: Payer: Self-pay | Admitting: Obstetrics and Gynecology

## 2015-11-03 DIAGNOSIS — N644 Mastodynia: Secondary | ICD-10-CM

## 2015-11-04 ENCOUNTER — Ambulatory Visit
Admission: RE | Admit: 2015-11-04 | Discharge: 2015-11-04 | Disposition: A | Discharge status: Home / Self Care | Payer: Medicare Other | Source: Ambulatory Visit | Attending: Obstetrics and Gynecology | Admitting: Obstetrics and Gynecology

## 2015-11-04 ENCOUNTER — Other Ambulatory Visit: Payer: Self-pay | Admitting: Obstetrics and Gynecology

## 2015-11-04 DIAGNOSIS — N644 Mastodynia: Secondary | ICD-10-CM

## 2015-11-07 ENCOUNTER — Other Ambulatory Visit: Payer: Self-pay | Admitting: Obstetrics and Gynecology

## 2015-11-07 ENCOUNTER — Ambulatory Visit
Admission: RE | Admit: 2015-11-07 | Discharge: 2015-11-07 | Disposition: A | Discharge status: Home / Self Care | Payer: Medicare Other | Source: Ambulatory Visit | Attending: Obstetrics and Gynecology | Admitting: Obstetrics and Gynecology

## 2015-11-07 DIAGNOSIS — N6310 Unspecified lump in the right breast, unspecified quadrant: Secondary | ICD-10-CM

## 2015-11-07 DIAGNOSIS — O9279 Other disorders of lactation: Secondary | ICD-10-CM | POA: Insufficient documentation

## 2015-11-07 DIAGNOSIS — N644 Mastodynia: Secondary | ICD-10-CM

## 2015-11-08 ENCOUNTER — Telehealth: Payer: Self-pay

## 2015-11-28 ENCOUNTER — Other Ambulatory Visit: Payer: Self-pay | Admitting: Obstetrics and Gynecology

## 2015-11-28 DIAGNOSIS — N644 Mastodynia: Secondary | ICD-10-CM

## 2015-12-02 ENCOUNTER — Ambulatory Visit
Admission: RE | Admit: 2015-12-02 | Discharge: 2015-12-02 | Disposition: A | Discharge status: Home / Self Care | Payer: Medicare Other | Source: Ambulatory Visit | Attending: Obstetrics and Gynecology | Admitting: Obstetrics and Gynecology

## 2015-12-02 DIAGNOSIS — N644 Mastodynia: Secondary | ICD-10-CM

## 2015-12-12 ENCOUNTER — Other Ambulatory Visit (INDEPENDENT_AMBULATORY_CARE_PROVIDER_SITE_OTHER): Payer: Medicare Other

## 2015-12-12 ENCOUNTER — Encounter: Payer: Self-pay | Admitting: Nurse Practitioner

## 2015-12-12 ENCOUNTER — Ambulatory Visit (INDEPENDENT_AMBULATORY_CARE_PROVIDER_SITE_OTHER): Payer: Medicare Other | Admitting: Nurse Practitioner

## 2015-12-12 VITALS — BP 116/78 | HR 71 | Temp 98.0°F | Ht 61.0 in | Wt 182.0 lb

## 2015-12-12 DIAGNOSIS — R635 Abnormal weight gain: Secondary | ICD-10-CM

## 2015-12-12 DIAGNOSIS — Z0001 Encounter for general adult medical examination with abnormal findings: Secondary | ICD-10-CM

## 2015-12-12 DIAGNOSIS — L739 Follicular disorder, unspecified: Secondary | ICD-10-CM

## 2015-12-12 DIAGNOSIS — N39490 Overflow incontinence: Secondary | ICD-10-CM | POA: Diagnosis not present

## 2015-12-12 DIAGNOSIS — N393 Stress incontinence (female) (male): Secondary | ICD-10-CM

## 2015-12-12 LAB — COMPREHENSIVE METABOLIC PANEL
ALT: 36 U/L — ABNORMAL HIGH (ref 0–35)
AST: 28 U/L (ref 0–37)
Albumin: 4.3 g/dL (ref 3.5–5.2)
Alkaline Phosphatase: 87 U/L (ref 39–117)
BUN: 9 mg/dL (ref 6–23)
CO2: 26 mEq/L (ref 19–32)
Calcium: 9.4 mg/dL (ref 8.4–10.5)
Chloride: 103 mEq/L (ref 96–112)
Creatinine, Ser: 0.66 mg/dL (ref 0.40–1.20)
GFR: 105.39 mL/min (ref >60.00)
Glucose, Bld: 90 mg/dL (ref 70–99)
Potassium: 4.2 mEq/L (ref 3.5–5.1)
Sodium: 136 mEq/L (ref 135–145)
Total Bilirubin: 0.5 mg/dL (ref 0.2–1.2)
Total Protein: 8.1 g/dL (ref 6.0–8.3)

## 2015-12-12 LAB — CBC WITH DIFFERENTIAL/PLATELET
Basophils Absolute: 0 10*3/uL (ref 0.0–0.1)
Basophils Relative: 0.5 % (ref 0.0–3.0)
Eosinophils Absolute: 0.1 10*3/uL (ref 0.0–0.7)
Eosinophils Relative: 1.4 % (ref 0.0–5.0)
HCT: 39.7 % (ref 36.0–46.0)
Hemoglobin: 13.5 g/dL (ref 12.0–15.0)
Lymphocytes Relative: 30.9 % (ref 12.0–46.0)
Lymphs Abs: 2 10*3/uL (ref 0.7–4.0)
MCHC: 34 g/dL (ref 30.0–36.0)
MCV: 85.6 fl (ref 78.0–100.0)
Monocytes Absolute: 0.4 10*3/uL (ref 0.1–1.0)
Monocytes Relative: 6.4 % (ref 3.0–12.0)
Neutro Abs: 3.9 10*3/uL (ref 1.4–7.7)
Neutrophils Relative %: 60.8 % (ref 43.0–77.0)
Platelets: 276 10*3/uL (ref 150.0–400.0)
RBC: 4.64 Mil/uL (ref 3.87–5.11)
RDW: 14.1 % (ref 11.5–15.5)
WBC: 6.4 10*3/uL (ref 4.0–10.5)

## 2015-12-12 LAB — HEMOGLOBIN A1C: Hgb A1c MFr Bld: 5.5 % (ref 4.6–6.5)

## 2015-12-12 LAB — TSH: TSH: 1.25 u[IU]/mL (ref 0.35–4.50)

## 2015-12-12 MED ORDER — CEPHALEXIN 500 MG PO CAPS: 500.0000 mg | ORAL_CAPSULE | Freq: Two times a day (BID) | ORAL | 0 refills | Status: DC

## 2015-12-12 MED ORDER — CLINDAMYCIN PHOSPHATE 1 % EX LOTN: TOPICAL_LOTION | Freq: Two times a day (BID) | CUTANEOUS | 0 refills | Status: DC

## 2015-12-12 NOTE — Progress Notes (Signed)
Pre visit review using our clinic review tool, if applicable. No additional management support is needed unless otherwise documented below in the visit note. 

## 2015-12-12 NOTE — Progress Notes (Signed)
Subjective:    Patient ID: Melissa [name], female    DOB: 11/08/1975, 40 y.o.   MRN: [id number]  Patient presents today for complete physical or establish care (new patient) and rash and stress incontinence.  Rash  This is a new problem. The current episode started more than 1 month ago. The problem has been gradually worsening since onset. The affected locations include the left upper leg, left arm, right arm and right upper leg. The rash is characterized by itchiness and redness. She was exposed to nothing. Pertinent negatives include no anorexia, congestion, cough, diarrhea, fatigue, fever, joint pain, nail changes, shortness of breath or sore throat. Past treatments include anti-itch cream and moisturizer. The treatment provided mild relief. There is no history of asthma, eczema or varicella.  does not shave frequently.  Urinary Incontinence: Also complains of urinary incontinence since vaginal delivery of baby 40month ago. No improvement with kegel exercises Has difficulty holding urinary flow when laughing or coughing. denies any LUTs or vaginal symptoms. Denies any vaginal tear or episiotomy during child birth. She wants referral to urologist.  Immunizations: (TDAP, Hep C screen, Pneumovax, Influenza, zoster)  Health Maintenance  Topic Date Due  . Flu Shot  03/31/2016*  . Pap Smear  02/19/2017  . Tetanus Vaccine  09/22/2025  . HIV Screening  Completed  *Topic was postponed. The date shown is not the original due date.   Diet:regular Weight:  Wt Readings from Last 3 Encounters:  12/12/15 182 lb (82.6 kg)  02/02/15 165 lb 3.2 oz (74.9 kg)  11/29/14 158 lb 9.6 oz (71.9 kg)   Exercise:none Fall Risk: Fall Risk  12/12/2015 06/17/2014  Falls in the past year? No No  Risk for fall due to : - Other (Comment)   Home Safety:home with mother Depression/Suicide: [facility] 12/12/2015 06/17/2014 05/17/2014 04/17/2013  Decreased Interest 0 0 0 0  Down, Depressed,  Hopeless 0 0 0 0  PHQ - 2 Score 0 0 0 0   Pap Smear (every 369yrfor >21-29 without HPV, every 5y24yror >30-65y62yrth HPV):deferred to GYN, up to date Mammogram (yearly, >30yr40yreded Vision:up to date Dental:up to date Advanced Directive: Advanced Directives 09/21/2015  Does Patient Have a Medical Advance Directive? No  Would patient like information on creating a medical advance directive? No - patient declined information   Sexual History (birth control, marital status, STD):sexually active, use of IUD, breastfeeding, has 3mont74monthbaby  Medications and allergies reviewed with patient and updated if appropriate.  Patient Active Problem List   Diagnosis Date Noted  . SVD (spontaneous vaginal delivery) 09/22/2015  . Post term pregnancy over 40 weeks 09/21/2015  . Term pregnancy 09/21/2015  . Radial tunnel syndrome 06/17/2014  . Cervical radiculopathy 06/10/2014  . Bilateral low back pain with right-sided sciatica 06/10/2014  . Bipolar depression (HCC) 0[place]6/2016  . Chronic night sweats 06/26/2013  . History of recurrent spontaneous abortion, not currently pregnant 01/26/2012  . Fluid retention 01/25/2012  . Weight gain 01/25/2012  . Bacterial vulvovaginitis 07/20/2011  . Ectopic pregnancy 02/07/2011  . Gonorrhea     Current Outpatient Prescriptions on File Prior to Visit  Medication Sig Dispense Refill  . ferrous sulfate 325 (65 FE) MG tablet Take 1 tablet (325 mg total) by mouth daily with breakfast. 30 tablet 3  . Prenatal Vit-Fe Fumarate-FA (PRENATAL VITAMINS) 28-0.8 MG TABS Take 1 tablet by mouth daily. 30 tablet 4  . ibuprofen (ADVIL,MOTRIN) 800 MG tablet Take 1 tablet (800 mg  total) by mouth every 8 (eight) hours as needed for mild pain, moderate pain or cramping. (Patient not taking: Reported on 12/12/2015) 40 tablet 1   No current facility-administered medications on file prior to visit.     Past Medical History:  Diagnosis Date  . Anxiety   . Arthritis   .  Carpal tunnel syndrome   . Depression   . Hammer toe 04/2013   bilateral 4th and 5th   . Ingrown left big toenail 04/2013  . Mental disorder   . Neuromuscular disorder ([place])   . Post term pregnancy over 40 weeks 09/21/2015  . SVD (spontaneous vaginal delivery) 09/22/2015  . UTI (lower urinary tract infection)     Past Surgical History:  Procedure Laterality Date  . BREAST SURGERY    . CORRECTION HAMMER TOE Bilateral   . CRYOABLATION  2004  . CYSTOSCOPY WITH HYDRODISTENSION AND BIOPSY  07/28/2004   with urethral dilatation  . DILATION AND CURETTAGE OF UTERUS    . DILATION AND EVACUATION  12/21/2011   Procedure: DILATATION AND EVACUATION;  Surgeon: Terrance Mass, MD;  Location: [facility];  Service: Gynecology;  Laterality: N/A;  first trimester   . HAMMER TOE SURGERY Bilateral 04/20/2013   Procedure: BILATERAL HAMMER TOE REPAIR AND EXCISION NAIL PERMANENT LEFT FIRST;  Surgeon: Harriet Masson, DPM;  Location: [facility];  Service: Podiatry;  Laterality: Bilateral;  Hammer toe repair fourth and fifth toes bilateral feet with screw placement fourth toes and partial nail excision left great toe  . HYSTEROSALPINGOGRAM    . TOE SURGERY     ingrown toenail  . WISDOM TOOTH EXTRACTION      Social History   Social History  . Marital status: Single    Spouse name: N/A  . Number of children: N/A  . Years of education: N/A   Social History Main Topics  . Smoking status: Never Smoker  . Smokeless tobacco: Never Used  . Alcohol use No     Comment: occasionally  . Drug use: No  . Sexual activity: Yes    Birth control/ protection: IUD   Other Topics Concern  . None   Social History Narrative  . None    Family History  Problem Relation Age of Onset  . Cancer Father     lung cancer  . Birth defects Paternal Grandfather   . Seizures Mother   . Arthritis Mother   . Thyroid disease Maternal Grandmother   . Heart disease Paternal Grandmother     2  CABG        Review of Systems  Constitutional: Negative for fatigue, fever, malaise/fatigue and weight loss.  HENT: Negative for congestion and sore throat.   Eyes:       Negative for visual changes  Respiratory: Negative for cough and shortness of breath.   Cardiovascular: Negative for chest pain, palpitations and leg swelling.  Gastrointestinal: Negative for anorexia, blood in stool, constipation, diarrhea and heartburn.  Genitourinary: Negative for dysuria, frequency and urgency.  Musculoskeletal: Negative for falls, joint pain and myalgias.  Skin: Positive for itching and rash. Negative for nail changes.  Neurological: Negative for dizziness, sensory change and headaches.  Endo/Heme/Allergies: Negative for polydipsia. Does not bruise/bleed easily.  Psychiatric/Behavioral: Negative for depression, substance abuse and suicidal ideas. The patient is not nervous/anxious.     Objective:   Vitals:   12/12/15 0814  BP: 116/78  Pulse: 71  Temp: 98 F (36.7 C)    Body  mass index is 34.39 kg/m.   Physical Examination:  Physical Exam  Constitutional: She is oriented to person, place, and time and well-developed, well-nourished, and in no distress. No distress.  HENT:  Right Ear: External ear normal.  Left Ear: External ear normal.  Nose: Nose normal.  Mouth/Throat: Oropharynx is clear and moist. No oropharyngeal exudate.  Eyes: Conjunctivae and EOM are normal. Pupils are equal, round, and reactive to light. No scleral icterus.  Neck: Normal range of motion. Neck supple. No thyromegaly present.  Cardiovascular: Normal rate, regular rhythm, normal heart sounds and intact distal pulses.   Pulmonary/Chest: Effort normal and breath sounds normal. She exhibits no tenderness.  deferred to GYN.  Abdominal: Soft. Bowel sounds are normal. She exhibits no distension. There is no tenderness.  Genitourinary:  Genitourinary Comments: Deferred to GYN  Musculoskeletal: Normal range of  motion. She exhibits no edema or tenderness.  Lymphadenopathy:    She has no cervical adenopathy.  Neurological: She is alert and oriented to person, place, and time. Gait normal.  Skin: Skin is warm and dry. Rash noted. Rash is papular. There is erythema.     Psychiatric: Affect and judgment normal.  Vitals reviewed.   ASSESSMENT and PLAN:  Saren was seen today for establish care.  Diagnoses and all orders for this visit:  Encounter for preventative adult health care exam with abnormal findings -     Comp Met (CMET); Future -     CBC w/Diff; Future -     TSH; Future -     Hemoglobin A1c; Future  Weight gain -     TSH; Future -     Hemoglobin A1c; Future  Folliculitis -     cephALEXin (KEFLEX) 500 MG capsule; Take 1 capsule (500 mg total) by mouth 2 (two) times daily. -     clindamycin (CLEOCIN-T) 1 % lotion; Apply topically 2 (two) times daily.  Incontinence overflow, stress female -     Ambulatory referral to Urology   No problem-specific Assessment & Plan notes found for this encounter.    Recent Results (from the past 2160 hour(s))  OB RESULT CONSOLE Group B Strep     Status: None   Collection Time: 09/21/15  2:22 PM  Result Value Ref Range   GBS Positive   OB RESULTS CONSOLE GC/Chlamydia     Status: None   Collection Time: 09/21/15  2:22 PM  Result Value Ref Range   Gonorrhea Negative   OB RESULTS CONSOLE RPR     Status: None   Collection Time: 09/21/15  2:22 PM  Result Value Ref Range   RPR Nonreactive   OB RESULTS CONSOLE HIV antibody     Status: None   Collection Time: 09/21/15  2:22 PM  Result Value Ref Range   HIV Non-reactive   OB RESULTS CONSOLE Rubella Antibody     Status: None   Collection Time: 09/21/15  2:22 PM  Result Value Ref Range   Rubella Immune   CBC     Status: Abnormal   Collection Time: 09/21/15  2:45 PM  Result Value Ref Range   WBC 9.4 4.0 - 10.5 K/uL   RBC 4.31 3.87 - 5.11 MIL/uL   Hemoglobin 13.6 12.0 - 15.0 g/dL   HCT  37.6 36.0 - 46.0 %   MCV 87.2 78.0 - 100.0 fL   MCH 31.6 26.0 - 34.0 pg   MCHC 36.2 (H) 30.0 - 36.0 g/dL   RDW 14.8 11.5 - 15.5 %  Platelets 262 150 - 400 K/uL  RPR     Status: None   Collection Time: 09/21/15  2:45 PM  Result Value Ref Range   RPR Ser Ql Non Reactive Non Reactive    Comment: (NOTE) Performed At: [facility] [address] [place], [place] [id number] Lindon Romp MD BM:[id number]   Type and screen [place]     Status: None   Collection Time: 09/21/15  2:45 PM  Result Value Ref Range   ABO/RH(D) O NEG    Antibody Screen NEG    Sample Expiration 09/24/2015   CBC     Status: Abnormal   Collection Time: 09/23/15  5:23 AM  Result Value Ref Range   WBC 16.3 (H) 4.0 - 10.5 K/uL   RBC 2.71 (L) 3.87 - 5.11 MIL/uL   Hemoglobin 8.6 (L) 12.0 - 15.0 g/dL    Comment: REPEATED TO VERIFY DELTA CHECK NOTED    HCT 24.2 (L) 36.0 - 46.0 %   MCV 89.3 78.0 - 100.0 fL   MCH 31.7 26.0 - 34.0 pg   MCHC 35.5 30.0 - 36.0 g/dL   RDW 15.9 (H) 11.5 - 15.5 %   Platelets 204 150 - 400 K/uL  Rh IG workup (includes ABO/Rh)     Status: None   Collection Time: 09/23/15  5:23 AM  Result Value Ref Range   Gestational Age(Wks) 40    ABO/RH(D) O NEG    Unit Number [id number]    Blood Component Type RHIG    Unit division 00    Status of Unit ISSUED,FINAL    Transfusion Status OK TO TRANSFUSE   Kleihauer-Betke stain     Status: None   Collection Time: 09/23/15  5:23 AM  Result Value Ref Range   Fetal Cells % 0 %   Quantitation Fetal Hemoglobin 0 mL   # Vials RhIg 1   Comp Met (CMET)     Status: Abnormal   Collection Time: 12/12/15 10:21 AM  Result Value Ref Range   Sodium 136 135 - 145 mEq/L   Potassium 4.2 3.5 - 5.1 mEq/L   Chloride 103 96 - 112 mEq/L   CO2 26 19 - 32 mEq/L   Glucose, Bld 90 70 - 99 mg/dL   BUN 9 6 - 23 mg/dL   Creatinine, Ser 0.66 0.40 - 1.20 mg/dL   Total Bilirubin 0.5 0.2 - 1.2 mg/dL   Alkaline Phosphatase 87 39 - 117  U/L   AST 28 0 - 37 U/L   ALT 36 (H) 0 - 35 U/L   Total Protein 8.1 6.0 - 8.3 g/dL   Albumin 4.3 3.5 - 5.2 g/dL   Calcium 9.4 8.4 - 10.5 mg/dL   GFR 105.39 >60.00 mL/min  CBC w/Diff     Status: None   Collection Time: 12/12/15 10:21 AM  Result Value Ref Range   WBC 6.4 4.0 - 10.5 K/uL   RBC 4.64 3.87 - 5.11 Mil/uL   Hemoglobin 13.5 12.0 - 15.0 g/dL   HCT 39.7 36.0 - 46.0 %   MCV 85.6 78.0 - 100.0 fl   MCHC 34.0 30.0 - 36.0 g/dL   RDW 14.1 11.5 - 15.5 %   Platelets 276.0 150.0 - 400.0 K/uL   Neutrophils Relative % 60.8 43.0 - 77.0 %   Lymphocytes Relative 30.9 12.0 - 46.0 %   Monocytes Relative 6.4 3.0 - 12.0 %   Eosinophils Relative 1.4 0.0 - 5.0 %   Basophils Relative 0.5  0.0 - 3.0 %   Neutro Abs 3.9 1.4 - 7.7 K/uL   Lymphs Abs 2.0 0.7 - 4.0 K/uL   Monocytes Absolute 0.4 0.1 - 1.0 K/uL   Eosinophils Absolute 0.1 0.0 - 0.7 K/uL   Basophils Absolute 0.0 0.0 - 0.1 K/uL  TSH     Status: None   Collection Time: 12/12/15 10:21 AM  Result Value Ref Range   TSH 1.25 0.35 - 4.50 uIU/mL  Hemoglobin A1c     Status: None   Collection Time: 12/12/15 10:21 AM  Result Value Ref Range   Hgb A1c MFr Bld 5.5 4.6 - 6.5 %    Comment: Glycemic Control Guidelines for People with Diabetes:Non Diabetic:  <6%Goal of Therapy: <7%Additional Action Suggested:  >8%    Follow up: Return in about 3 months (around 03/11/2016) for weight loss and folliculitis.  Wilfred Lacy, NP

## 2015-12-12 NOTE — Patient Instructions (Addendum)
Advised patient about use of Poise tampons for bladder support.  Exercising to Lose Weight Introduction Exercising can help you to lose weight. In order to lose weight through exercise, you need to do vigorous-intensity exercise. You can tell that you are exercising with vigorous intensity if you are breathing very hard and fast and cannot hold a conversation while exercising. Moderate-intensity exercise helps to maintain your current weight. You can tell that you are exercising at a moderate level if you have a higher heart rate and faster breathing, but you are still able to hold a conversation. How often should I exercise? Choose an activity that you enjoy and set realistic goals. Your health care provider can help you to make an activity plan that works for you. Exercise regularly as directed by your health care provider. This may include:  Doing resistance training twice each week, such as:  Push-ups.  Sit-ups.  Lifting weights.  Using resistance bands.  Doing a given intensity of exercise for a given amount of time. Choose from these options:  150 minutes of moderate-intensity exercise every week.  75 minutes of vigorous-intensity exercise every week.  A mix of moderate-intensity and vigorous-intensity exercise every week. Children, pregnant women, people who are out of shape, people who are overweight, and older adults may need to consult a health care provider for individual recommendations. If you have any sort of medical condition, be sure to consult your health care provider before starting a new exercise program. What are some activities that can help me to lose weight?  Walking at a rate of at least 4.5 miles an hour.  Jogging or running at a rate of 5 miles per hour.  Biking at a rate of at least 10 miles per hour.  Lap swimming.  Roller-skating or in-line skating.  Cross-country skiing.  Vigorous competitive sports, such as football, basketball, and  soccer.  Jumping rope.  Aerobic dancing. How can I be more active in my day-to-day activities?  Use the stairs instead of the elevator.  Take a walk during your lunch break.  If you drive, park your car farther away from work or school.  If you take public transportation, get off one stop early and walk the rest of the way.  Make all of your phone calls while standing up and walking around.  Get up, stretch, and walk around every 30 minutes throughout the day. What guidelines should I follow while exercising?  Do not exercise so much that you hurt yourself, feel dizzy, or get very short of breath.  Consult your health care provider prior to starting a new exercise program.  Wear comfortable clothes and shoes with good support.  Drink plenty of water while you exercise to prevent dehydration or heat stroke. Body water is lost during exercise and must be replaced.  Work out until you breathe faster and your heart beats faster. This information is not intended to replace advice given to you by your health care provider. Make sure you discuss any questions you have with your health care provider. Document Released: 01/20/2010 Document Revised: 05/26/2015 Document Reviewed: 05/21/2013  0000000 Elsevier Folliculitis Folliculitis is redness, soreness, and swelling (inflammation) of the hair follicles. This condition can occur anywhere on the body. People with weakened immune systems, diabetes, or obesity have a greater risk of getting folliculitis. CAUSES  Bacterial infection. This is the most common cause.  Fungal infection.  Viral infection.  Contact with certain chemicals, especially oils and tars. Long-term folliculitis can result  from bacteria that live in the nostrils. The bacteria may trigger multiple outbreaks of folliculitis over time. SYMPTOMS Folliculitis most commonly occurs on the scalp, thighs, legs, back, buttocks, and areas where hair is shaved frequently. An early  sign of folliculitis is a small, white or yellow, pus-filled, itchy lesion (pustule). These lesions appear on a red, inflamed follicle. They are usually less than 0.2 inches (5 mm) wide. When there is an infection of the follicle that goes deeper, it becomes a boil or furuncle. A group of closely packed boils creates a larger lesion (carbuncle). Carbuncles tend to occur in hairy, sweaty areas of the body. DIAGNOSIS  Your caregiver can usually tell what is wrong by doing a physical exam. A sample may be taken from one of the lesions and tested in a lab. This can help determine what is causing your folliculitis. TREATMENT  Treatment may include:  Applying warm compresses to the affected areas.  Taking antibiotic medicines orally or applying them to the skin.  Draining the lesions if they contain a large amount of pus or fluid.  Laser hair removal for cases of long-lasting folliculitis. This helps to prevent regrowth of the hair. HOME CARE INSTRUCTIONS  Apply warm compresses to the affected areas as directed by your caregiver.  If antibiotics are prescribed, take them as directed. Finish them even if you start to feel better.  You may take over-the-counter medicines to relieve itching.  Do not shave irritated skin.  Follow up with your caregiver as directed. SEEK IMMEDIATE MEDICAL CARE IF:   You have increasing redness, swelling, or pain in the affected area.  You have a fever. MAKE SURE YOU:  Understand these instructions.  Will watch your condition.  Will get help right away if you are not doing well or get worse. This information is not intended to replace advice given to you by your health care provider. Make sure you discuss any questions you have with your health care provider. Document Released: 02/26/2001 Document Revised: 01/08/2014 Document Reviewed: 10/08/2014 Elsevier Interactive Patient Education  2017 [employer].

## 2016-01-01 NOTE — Progress Notes (Signed)
[name] [place], [place] [address] Phone: [phone] Subjective:    I'm seeing this patient by the request  of: Wilfred Lacy, NP    CC: Bilateral wrist pain  [id number]  Melissa [name] is a 40 y.o. female coming in with complaint of multiple joint pains and aches. Patient states though were seems to be bilateral wrist pain. Has had for multiple months. Seems worse after she is pregnant. Patient states that now unfortunate since affecting daily activities. Difficult even pick up her child. Patient states sometimes can be associated with numbness in her fingers. Has seen another provider and was given injections previously with no significant improvement. Has tried bracing but has only been intermittently helpful. Patient rates the severity pain is 8 out of 10. Patient is also complaining of aches and pains everywhere. Has been diagnosed with fibromyalgia. His sleep. Feels that something is wrong but no significant diagnosis though. Patient states it is affecting daily activities. Waking her up at night. Sometimes can discontinue certain activities because of the pain. Has had difficulty working on a regular basis as well.    Past Medical History:  Diagnosis Date  . Anxiety   . Arthritis   . Carpal tunnel syndrome   . Depression   . Hammer toe 04/2013   bilateral 4th and 5th   . Ingrown left big toenail 04/2013  . Mental disorder   . Neuromuscular disorder ([place])   . Post term pregnancy over 40 weeks 09/21/2015  . SVD (spontaneous vaginal delivery) 09/22/2015  . UTI (lower urinary tract infection)    Past Surgical History:  Procedure Laterality Date  . BREAST SURGERY    . CORRECTION HAMMER TOE Bilateral   . CRYOABLATION  2004  . CYSTOSCOPY WITH HYDRODISTENSION AND BIOPSY  07/28/2004   with urethral dilatation  . DILATION AND CURETTAGE OF UTERUS    . DILATION AND EVACUATION  12/21/2011   Procedure: DILATATION AND EVACUATION;   Surgeon: Terrance Mass, MD;  Location: [facility];  Service: Gynecology;  Laterality: N/A;  first trimester   . HAMMER TOE SURGERY Bilateral 04/20/2013   Procedure: BILATERAL HAMMER TOE REPAIR AND EXCISION NAIL PERMANENT LEFT FIRST;  Surgeon: Harriet Masson, DPM;  Location: [facility];  Service: Podiatry;  Laterality: Bilateral;  Hammer toe repair fourth and fifth toes bilateral feet with screw placement fourth toes and partial nail excision left great toe  . HYSTEROSALPINGOGRAM    . TOE SURGERY     ingrown toenail  . WISDOM TOOTH EXTRACTION     Social History   Social History  . Marital status: Single    Spouse name: N/A  . Number of children: N/A  . Years of education: N/A   Social History Main Topics  . Smoking status: Never Smoker  . Smokeless tobacco: Never Used  . Alcohol use No     Comment: occasionally  . Drug use: No  . Sexual activity: Yes    Birth control/ protection: IUD   Other Topics Concern  . None   Social History Narrative  . None   No Known Allergies Family History  Problem Relation Age of Onset  . Cancer Father     lung cancer  . Birth defects Paternal Grandfather   . Seizures Mother   . Arthritis Mother   . Thyroid disease Maternal Grandmother   . Heart disease Paternal Grandmother     2 CABG    Past  medical history, social, surgical and family history all reviewed in electronic medical record.  No pertanent information unless stated regarding to the chief complaint.   Review of Systems:Review of systems updated and as accurate as of 01/03/16  Positive for multiple joint pains and joint swelling, muscle aches, denies any fevers chills any abnormal weight loss, denies any diarrhea or constipation.  Objective  Blood pressure 114/82, pulse 79, height 5\' 1"  (1.549 m), weight 185 lb (83.9 kg), SpO2 98 %, unknown if currently breastfeeding. Systems examined below as of 01/03/16   General: No apparent distress alert  and oriented x3 mood and affect normal, dressed appropriately.  HEENT: Pupils equal, extraocular movements intact  Respiratory: Patient's speak in full sentences and does not appear short of breath  Cardiovascular: No lower extremity edema, non tender, no erythema  Skin: Warm dry intact with no signs of infection or rash on extremities or on axial skeleton.  Abdomen: Soft nontender  Neuro: Cranial nerves II through XII are intact, neurovascularly intact in all extremities with 2+ DTRs and 2+ pulses.  Lymph: No lymphadenopathy of posterior or anterior cervical chain or axillae bilaterally.  Gait normal with good balance and coordination.  MSK:  tender with full range of motion and good stability and symmetric strength and tone of shoulders, elbows,  hip, knee and ankles bilaterally.  Diffusely tender over multiple joints  Wrist: Bilateral Inspection normal with no visible erythema or swelling. ROM smooth and normal with good flexion and extension and ulnar/radial deviation that is symmetrical with opposite wrist. Diffusely tender to palpation No snuffbox tenderness. No tenderness over Canal of Gu[place]. Strength 5/5 in all directions without pain. Negative Finkelstein, positive tinel's and phalens. Negative Watson's test.    MSK US performed of: Bilateral This study was ordered, performed, and interpreted by Charlann Boxer D.O.  Wrist: All extensor compartments visualized and tendons all normal in appearance without fraying, tears, or sheath effusions. No effusion seen. TFCC intact. Scapholunate ligament intact. Carpal tunnel visualized and is enlarged with an area of 2.1 cm on the right and 1.9 cm on the left  IMPRESSION:  Bilateral carpal tunnel  Procedure note [id number]; 15 minutes spent for Therapeutic exercises as stated in above notes.  This included exercises focusing on stretching, strengthening, with significant focus on eccentric aspects. Flexion and extension exercises with  abduction and opposition eccentric's focused   Proper technique shown and discussed handout in great detail with ATC.  All questions were discussed and answered.      Impression and Recommendations:     This case required medical decision making of moderate complexity.      Note: This dictation was prepared with Dragon dictation along with smaller phrase technology. Any transcriptional errors that result from this process are unintentional.

## 2016-01-03 ENCOUNTER — Encounter: Payer: Self-pay | Admitting: Family Medicine

## 2016-01-03 ENCOUNTER — Ambulatory Visit (INDEPENDENT_AMBULATORY_CARE_PROVIDER_SITE_OTHER): Payer: Medicare Other | Admitting: Family Medicine

## 2016-01-03 ENCOUNTER — Ambulatory Visit: Payer: Self-pay

## 2016-01-03 ENCOUNTER — Other Ambulatory Visit (INDEPENDENT_AMBULATORY_CARE_PROVIDER_SITE_OTHER): Payer: Medicare Other

## 2016-01-03 VITALS — BP 114/82 | HR 79 | Ht 61.0 in | Wt 185.0 lb

## 2016-01-03 DIAGNOSIS — M25531 Pain in right wrist: Secondary | ICD-10-CM | POA: Diagnosis not present

## 2016-01-03 DIAGNOSIS — M25532 Pain in left wrist: Secondary | ICD-10-CM

## 2016-01-03 DIAGNOSIS — M2550 Pain in unspecified joint: Secondary | ICD-10-CM

## 2016-01-03 DIAGNOSIS — G5600 Carpal tunnel syndrome, unspecified upper limb: Secondary | ICD-10-CM | POA: Insufficient documentation

## 2016-01-03 DIAGNOSIS — G5603 Carpal tunnel syndrome, bilateral upper limbs: Secondary | ICD-10-CM

## 2016-01-03 LAB — C-REACTIVE PROTEIN: CRP: 0.3 mg/dL — ABNORMAL LOW (ref 0.5–20.0)

## 2016-01-03 LAB — VITAMIN D 25 HYDROXY (VIT D DEFICIENCY, FRACTURES): VITD: 22.87 ng/mL — ABNORMAL LOW (ref 30.00–100.00)

## 2016-01-03 LAB — SEDIMENTATION RATE: Sed Rate: 30 mm/hr — ABNORMAL HIGH (ref 0–20)

## 2016-01-03 MED ORDER — DICLOFENAC SODIUM 2 % TD SOLN: TRANSDERMAL | 3 refills | Status: DC

## 2016-01-03 MED ORDER — GABAPENTIN 100 MG PO CAPS: 200.0000 mg | ORAL_CAPSULE | Freq: Every day | ORAL | 3 refills | Status: DC

## 2016-01-03 MED ORDER — VITAMIN D (ERGOCALCIFEROL) 1.25 MG (50000 UNIT) PO CAPS: 50000.0000 [IU] | ORAL_CAPSULE | ORAL | 0 refills | Status: DC

## 2016-01-03 NOTE — Assessment & Plan Note (Signed)
She was having multiple joint pain. We discussed with patient at great length. Patient had laboratory workup to rule out any other things such as autoimmune diseases that could be contributing. Has been diagnosed with fibromyalgia from an outside facility. Depending on findings this could change medical management.

## 2016-01-03 NOTE — Assessment & Plan Note (Signed)
Patient does have what appears to be carpal tunnel syndrome bilaterally. Does have a history of cervical radiculopathy but I do not think this is coming from that. Patient states that she continues to have some discomfort. We'll try once weekly vitamin D, we'll get labs to make sure nothing else can be contribute in. Patient will try topical anti-inflammatories. Work with Product/process development scientist to learn home exercises in greater detail. We discussed icing regimen. Follow-up again in 4 weeks.

## 2016-01-03 NOTE — Patient Instructions (Addendum)
Good to see you  Happy New Year! Ice 20 minutes 2 times daily. Usually after activity and before bed. We will get some labs  Once weekly vitamin D for next 12 weeks.  pennsaid pinkie amount topically 2 times daily as needed.  Over the counter  Get turmeric 500mg  twice daily Exercises 3 times a week.  Avoid heavy lifting other then the baby Depending on labs and how you do I will see you again in 4 weeks and we can inject the nerve if needed or we will consider another medicine for any allergies.  Happy New Year!

## 2016-01-04 LAB — RHEUMATOID FACTOR: Rhuematoid fact SerPl-aCnc: <14 IU/mL (ref <14)

## 2016-01-04 LAB — ANA: Anti Nuclear Antibody(ANA): NEGATIVE

## 2016-01-04 LAB — ANGIOTENSIN CONVERTING ENZYME: Angiotensin-Converting Enzyme: 33 U/L (ref 9–67)

## 2016-01-04 LAB — CYCLIC CITRUL PEPTIDE ANTIBODY, IGG: Cyclic Citrullin Peptide Ab: <16 Units

## 2016-01-05 LAB — IBC PANEL
Iron: 60 ug/dL (ref 42–145)
Saturation Ratios: 13.2 % — ABNORMAL LOW (ref 20.0–50.0)
Transferrin: 325 mg/dL (ref 212.0–360.0)

## 2016-01-05 NOTE — Progress Notes (Unsigned)
Vitam

## 2016-01-09 ENCOUNTER — Encounter: Payer: Self-pay | Admitting: Nurse Practitioner

## 2016-01-26 ENCOUNTER — Other Ambulatory Visit: Payer: Self-pay | Admitting: Gynecology

## 2016-01-27 ENCOUNTER — Other Ambulatory Visit: Payer: Self-pay | Admitting: Gynecology

## 2016-01-27 DIAGNOSIS — N644 Mastodynia: Secondary | ICD-10-CM

## 2016-01-31 ENCOUNTER — Ambulatory Visit: Payer: Self-pay | Admitting: Family Medicine

## 2016-02-03 LAB — HM MAMMOGRAPHY

## 2016-02-06 ENCOUNTER — Ambulatory Visit
Admission: RE | Admit: 2016-02-06 | Discharge: 2016-02-06 | Disposition: A | Discharge status: Home / Self Care | Payer: Medicare Other | Source: Ambulatory Visit | Attending: Gynecology | Admitting: Gynecology

## 2016-02-06 ENCOUNTER — Other Ambulatory Visit: Payer: Self-pay

## 2016-02-06 DIAGNOSIS — N644 Mastodynia: Secondary | ICD-10-CM

## 2016-02-07 DIAGNOSIS — N3946 Mixed incontinence: Secondary | ICD-10-CM | POA: Insufficient documentation

## 2016-02-28 ENCOUNTER — Ambulatory Visit (INDEPENDENT_AMBULATORY_CARE_PROVIDER_SITE_OTHER): Payer: Medicare Other | Admitting: Nurse Practitioner

## 2016-02-28 ENCOUNTER — Encounter: Payer: Self-pay | Admitting: Nurse Practitioner

## 2016-02-28 VITALS — BP 130/82 | HR 68 | Temp 98.2°F | Ht 61.0 in | Wt 178.0 lb

## 2016-02-28 DIAGNOSIS — E669 Obesity, unspecified: Secondary | ICD-10-CM

## 2016-02-28 DIAGNOSIS — L219 Seborrheic dermatitis, unspecified: Secondary | ICD-10-CM

## 2016-02-28 DIAGNOSIS — Q828 Other specified congenital malformations of skin: Secondary | ICD-10-CM

## 2016-02-28 MED ORDER — DESONIDE 0.05 % EX CREA: TOPICAL_CREAM | Freq: Two times a day (BID) | CUTANEOUS | 0 refills | Status: DC

## 2016-02-28 MED ORDER — KETOCONAZOLE 1 % EX SHAM: MEDICATED_SHAMPOO | CUTANEOUS | 1 refills | Status: DC

## 2016-02-28 MED ORDER — PHENTERMINE HCL 15 MG PO CAPS: 15.0000 mg | ORAL_CAPSULE | ORAL | 0 refills | Status: DC

## 2016-02-28 MED ORDER — KETOCONAZOLE 2 % EX SHAM: MEDICATED_SHAMPOO | CUTANEOUS | 1 refills | Status: DC

## 2016-02-28 NOTE — Progress Notes (Signed)
Pre visit review using our clinic review tool, if applicable. No additional management support is needed unless otherwise documented below in the visit note. 

## 2016-02-28 NOTE — Patient Instructions (Addendum)
Please follow up with dermatologist about rash on extremities and scaly scalp.  Phentermine sustained-release capsules What is this medicine? PHENTERMINE (FEN ter meen) decreases your appetite. It is used with a reduced calorie diet and exercise to help you lose weight. This medicine may be used for other purposes; ask your health care provider or pharmacist if you have questions. COMMON BRAND NAME(S): Ionamin, Pro-Fast What should I tell my health care provider before I take this medicine? They need to know if you have any of these conditions: -agitation -glaucoma -heart disease -high blood pressure -history of substance abuse -lung disease called Primary Pulmonary Hypertension (PPH) -taken an MAOI like Carbex, Eldepryl, Marplan, Nardil, or Parnate in last 14 days -thyroid disease -an unusual or allergic reaction to phentermine, other medicines, foods, dyes, or preservatives -pregnant or trying to get pregnant -breast-feeding How should I use this medicine? Take this medicine by mouth with a glass of water. Follow the directions on the prescription label. This medicine is usually taken before breakfast or at least 10 to 14 hours before going to bed. Avoid taking this medicine in the evening. It may interfere with sleep. Swallow whole. Do not open or chew the capsules. Take your doses at regular intervals. Do not take your medicine more often than directed. Talk to your pediatrician regarding the use of this medicine in children. Special care may be needed. Overdosage: If you think you have taken too much of this medicine contact a poison control center or emergency room at once. NOTE: This medicine is only for you. Do not share this medicine with others. What if I miss a dose? If you miss a dose, take it as soon as you can. If it is almost time for your next dose, take only that dose. Do not take double or extra doses. What may interact with this medicine? Do not take this medicine with  any of the following medications: -duloxetine -MAOIs like Carbex, Eldepryl, Marplan, Nardil, and Parnate -medicines for colds or breathing difficulties like pseudoephedrine or phenylephrine -procarbazine -sibutramine -SSRIs like citalopram, escitalopram, fluoxetine, fluvoxamine, paroxetine, and sertraline -stimulants like dexmethylphenidate, methylphenidate or modafinil -venlafaxine This medicine may also interact with the following medications: -medicines for diabetes This list may not describe all possible interactions. Give your health care provider a list of all the medicines, herbs, non-prescription drugs, or dietary supplements you use. Also tell them if you smoke, drink alcohol, or use illegal drugs. Some items may interact with your medicine. What should I watch for while using this medicine? Notify your physician immediately if you become short of breath while doing your normal activities. Do not take this medicine within 6 hours of bedtime. It can keep you from getting to sleep. Avoid drinks that contain caffeine and try to stick to a regular bedtime every night. This medicine was intended to be used in addition to a healthy diet and exercise. The best results are achieved this way. This medicine is only indicated for short-term use. Eventually your weight loss may level out. At that point, the drug will only help you maintain your new weight. Do not increase or in any way change your dose without consulting your doctor. You may get drowsy or dizzy. Do not drive, use machinery, or do anything that needs mental alertness until you know how this medicine affects you. Do not stand or sit up quickly, especially if you are an older patient. This reduces the risk of dizzy or fainting spells. Alcohol may increase dizziness and  drowsiness. Avoid alcoholic drinks. What side effects may I notice from receiving this medicine? Side effects that you should report to your doctor or health care  professional as soon as possible: -chest pain, palpitations -depression or severe changes in mood -increased blood pressure -irritability -nervousness or restlessness -severe dizziness -shortness of breath -problems urinating -unusual swelling of the legs -vomiting Side effects that usually do not require medical attention (report to your doctor or health care professional if they continue or are bothersome): -blurred vision or other eye problems -changes in sexual ability or desire -constipation or diarrhea -difficulty sleeping -dry mouth or unpleasant taste -headache -nausea This list may not describe all possible side effects. Call your doctor for medical advice about side effects. You may report side effects to FDA at 1-800-FDA-1088. Where should I keep my medicine? Keep out of the reach of children. This medicine can be abused. Keep your medicine in a safe place to protect it from theft. Do not share this medicine with anyone. Selling or giving away this medicine is dangerous and against the law. This medicine may cause accidental overdose and death if taken by other adults, children, or pets. Mix any unused medicine with a substance like cat litter or coffee grounds. Then throw the medicine away in a sealed container like a sealed bag or a coffee can with a lid. Do not use the medicine after the expiration date. Store at room temperature between 20 and 25 degrees C (68 and 77 degrees F). Keep container tightly closed. NOTE: This sheet is a summary. It may not cover all possible information. If you have questions about this medicine, talk to your doctor, pharmacist, or health care provider.  2018 Elsevier/Gold Standard (2013-09-08 16:19:17)

## 2016-02-28 NOTE — Progress Notes (Signed)
Subjective:  Patient ID: Melissa [name], female    DOB: Jan 27, 1975  Age: 41 y.o. MRN: [id number]  CC: Follow-up (rash and scaly scalp.) and Obesity (wants to start on medication)   Rash  This is a recurrent problem. The current episode started more than 1 year ago. The problem has been waxing and waning since onset. The affected locations include the scalp (eye brows). The rash is characterized by scaling and itchiness. She was exposed to nothing. Pertinent negatives include no anorexia, congestion, cough, diarrhea, eye pain, facial edema, fatigue, fever, nail changes, rhinorrhea, shortness of breath, sore throat or vomiting. Past treatments include topical steroids, oral steroids, moisturizer and anti-itch cream. The treatment provided moderate relief. There is no history of allergies, asthma, eczema or varicella.   Weight Loss management: She will like to start medication to enable weight loss. She is no longer breast feeding, current use of IUD. She is exercising 4x/week at [facility] with trainer and has maintained low fat diet in last 60months.  Outpatient Medications Prior to Visit  Medication Sig Dispense Refill  . clindamycin (CLEOCIN-T) 1 % lotion Apply topically 2 (two) times daily. 60 mL 0  . Diclofenac Sodium (PENNSAID) 2 % SOLN Apply 1 pump twice daily as needed. 112 g 3  . ferrous sulfate 325 (65 FE) MG tablet Take 1 tablet (325 mg total) by mouth daily with breakfast. 30 tablet 3  . gabapentin (NEURONTIN) 100 MG capsule Take 2 capsules (200 mg total) by mouth at bedtime. 60 capsule 3  . Prenatal Vit-Fe Fumarate-FA (PRENATAL VITAMINS) 28-0.8 MG TABS Take 1 tablet by mouth daily. 30 tablet 4  . Vitamin D, Ergocalciferol, (DRISDOL) 50000 units CAPS capsule Take 1 capsule (50,000 Units total) by mouth every 7 (seven) days. 12 capsule 0  . cephALEXin (KEFLEX) 500 MG capsule Take 1 capsule (500 mg total) by mouth 2 (two) times daily. (Patient not taking: Reported on 02/28/2016) 20 capsule  0  . ibuprofen (ADVIL,MOTRIN) 800 MG tablet Take 1 tablet (800 mg total) by mouth every 8 (eight) hours as needed for mild pain, moderate pain or cramping. (Patient not taking: Reported on 12/12/2015) 40 tablet 1   No facility-administered medications prior to visit.     ROS See HPI  Objective:  BP 130/82   Pulse 68   Temp 98.2 F (36.8 C)   Ht 5\' 1"  (1.549 m)   Wt 178 lb (80.7 kg)   SpO2 100%   BMI 33.63 kg/m   BP Readings from Last 3 Encounters:  02/28/16 130/82  01/03/16 114/82  12/12/15 116/78    Wt Readings from Last 3 Encounters:  02/28/16 178 lb (80.7 kg)  01/03/16 185 lb (83.9 kg)  12/12/15 182 lb (82.6 kg)    Physical Exam  Constitutional: She is oriented to person, place, and time. No distress.  Cardiovascular: Normal rate, regular rhythm and normal heart sounds.   Pulmonary/Chest: Effort normal and breath sounds normal.  Abdominal: Soft.  Neurological: She is alert and oriented to person, place, and time.  Skin: Skin is warm and dry. Rash noted. Rash is maculopapular. No erythema.  Scaly scalp, eye brows and nasolabial folds.  maculopapular discrete lesions on thighs and arms.  Vitals reviewed.   Lab Results  Component Value Date   WBC 6.4 12/12/2015   HGB 13.5 12/12/2015   HCT 39.7 12/12/2015   PLT 276.0 12/12/2015   GLUCOSE 90 12/12/2015   CHOL 176 02/19/2014   TRIG 64 02/19/2014   HDL  43 02/19/2014   LDLCALC 120 (H) 02/19/2014   ALT 36 (H) 12/12/2015   AST 28 12/12/2015   NA 136 12/12/2015   K 4.2 12/12/2015   CL 103 12/12/2015   CREATININE 0.66 12/12/2015   BUN 9 12/12/2015   CO2 26 12/12/2015   TSH 1.25 12/12/2015   HGBA1C 5.5 12/12/2015    US Breast Ltd Uni Right Inc Axilla  Result Date: 02/06/2016 CLINICAL DATA:  41 year old female with far lateral/ axillary right breast pain. The patient stopped breastfeeding about 1 month ago. The patient was last seen in November 2017 at which time multiple aspirations of a right breast  galactocele were performed. EXAM: DIGITAL DIAGNOSTIC RIGHT MAMMOGRAM WITH CAD ULTRASOUND RIGHT BREAST COMPARISON:  Previous exam(s). ACR Breast Density Category c: The breast tissue is heterogeneously dense, which may obscure small masses. FINDINGS: There is increased density in the subareolar right breast. This is likely in the area of prior galactocele given its location on ultrasound at 12 o'clock, 1 cm from the nipple. Spot compression views of the central right breast demonstrate no suspicious mass in the area of concern. Findings are thought to likely reflect evolving changes following resolution of the patient's large galactocele. No suspicious mass, calcifications, or other abnormality is identified within the left breast. Mammographic images were processed with CAD. On physical exam, no discrete mass is felt in the subareolar right breast or in the patient's area of pain in the far lateral/ low axillary right breast. Targeted ultrasound of the subareolar right breast demonstrate no suspicious cystic or solid mass. Targeted ultrasound of the patient's area of concern in the lateral right breast/lower axilla demonstrates a prominent right axillary lymph node with cortical thickening measuring up to 4 mm. This lymph node is thought likely reactive. IMPRESSION: Probably benign right breast asymmetry and right axillary lymph node. RECOMMENDATION: Right diagnostic mammogram and ultrasound in 6 months. I have discussed the findings and recommendations with the patient. Results were also provided in writing at the conclusion of the visit. If applicable, a reminder letter will be sent to the patient regarding the next appointment. BI-RADS CATEGORY  3: Probably benign. Electronically Signed   By: Pamelia Hoit M.D.   On: 02/06/2016 17:02   Mm Diag Breast Tomo Bilateral  Result Date: 02/06/2016 CLINICAL DATA:  41 year old female with far lateral/ axillary right breast pain. The patient stopped breastfeeding about 1  month ago. The patient was last seen in November 2017 at which time multiple aspirations of a right breast galactocele were performed. EXAM: DIGITAL DIAGNOSTIC RIGHT MAMMOGRAM WITH CAD ULTRASOUND RIGHT BREAST COMPARISON:  Previous exam(s). ACR Breast Density Category c: The breast tissue is heterogeneously dense, which may obscure small masses. FINDINGS: There is increased density in the subareolar right breast. This is likely in the area of prior galactocele given its location on ultrasound at 12 o'clock, 1 cm from the nipple. Spot compression views of the central right breast demonstrate no suspicious mass in the area of concern. Findings are thought to likely reflect evolving changes following resolution of the patient's large galactocele. No suspicious mass, calcifications, or other abnormality is identified within the left breast. Mammographic images were processed with CAD. On physical exam, no discrete mass is felt in the subareolar right breast or in the patient's area of pain in the far lateral/ low axillary right breast. Targeted ultrasound of the subareolar right breast demonstrate no suspicious cystic or solid mass. Targeted ultrasound of the patient's area of concern in the lateral  right breast/lower axilla demonstrates a prominent right axillary lymph node with cortical thickening measuring up to 4 mm. This lymph node is thought likely reactive. IMPRESSION: Probably benign right breast asymmetry and right axillary lymph node. RECOMMENDATION: Right diagnostic mammogram and ultrasound in 6 months. I have discussed the findings and recommendations with the patient. Results were also provided in writing at the conclusion of the visit. If applicable, a reminder letter will be sent to the patient regarding the next appointment. BI-RADS CATEGORY  3: Probably benign. Electronically Signed   By: Pamelia Hoit M.D.   On: 02/06/2016 17:02    Assessment & Plan:   Caelynn was seen today for follow-up and  obesity.  Diagnoses and all orders for this visit:  Seborrheic dermatitis of scalp -     Discontinue: KETOCONAZOLE, TOPICAL, 1 % SHAM; Apply twice a week for 4weeks, with at least 3days between shampoo. -     desonide (DESOWEN) 0.05 % cream; Apply topically 2 (two) times daily. -     ketoconazole (NIZORAL) 2 % shampoo; Apply twice a week for 4 weeks, with at least 3 days between shampoo  Keratosis follicularis -     Discontinue: KETOCONAZOLE, TOPICAL, 1 % SHAM; Apply twice a week for 4weeks, with at least 3days between shampoo. -     desonide (DESOWEN) 0.05 % cream; Apply topically 2 (two) times daily. -     ketoconazole (NIZORAL) 2 % shampoo; Apply twice a week for 4 weeks, with at least 3 days between shampoo  Obesity (BMI 30.0-34.9) -     phentermine 15 MG capsule; Take 1 capsule (15 mg total) by mouth every morning.   I have discontinued Ms. Rosemeyer's ibuprofen, cephALEXin, and KETOCONAZOLE (TOPICAL). I am also having her start on desonide, phentermine, and ketoconazole. Additionally, I am having her maintain her Prenatal Vitamins, ferrous sulfate, clindamycin, Diclofenac Sodium, Vitamin D (Ergocalciferol), and gabapentin.  Meds ordered this encounter  Medications  . DISCONTD: KETOCONAZOLE, TOPICAL, 1 % SHAM    Sig: Apply twice a week for 4weeks, with at least 3days between shampoo.    Dispense:  200 mL    Refill:  1    Order Specific Question:   Supervising Provider    Answer:   Cassandria Anger [[id number]]  . desonide (DESOWEN) 0.05 % cream    Sig: Apply topically 2 (two) times daily.    Dispense:  60 g    Refill:  0    Order Specific Question:   Supervising Provider    Answer:   Cassandria Anger [[id number]]  . phentermine 15 MG capsule    Sig: Take 1 capsule (15 mg total) by mouth every morning.    Dispense:  30 capsule    Refill:  0    Order Specific Question:   Supervising Provider    Answer:   Cassandria Anger [[id number]]  . ketoconazole (NIZORAL) 2 % shampoo    Sig:  Apply twice a week for 4 weeks, with at least 3 days between shampoo    Dispense:  200 mL    Refill:  1    Follow-up: Return in about 2 weeks (around 03/13/2016), or if symptoms worsen or fail to improve, for weight loss and BP.  Wilfred Lacy, NP

## 2016-03-12 ENCOUNTER — Ambulatory Visit: Payer: Self-pay | Admitting: Nurse Practitioner

## 2016-03-22 ENCOUNTER — Telehealth: Payer: Self-pay | Admitting: Nurse Practitioner

## 2016-03-22 DIAGNOSIS — L219 Seborrheic dermatitis, unspecified: Secondary | ICD-10-CM

## 2016-03-22 DIAGNOSIS — Q828 Other specified congenital malformations of skin: Secondary | ICD-10-CM

## 2016-03-22 MED ORDER — KETOCONAZOLE 2 % EX SHAM: MEDICATED_SHAMPOO | CUTANEOUS | 1 refills | Status: DC

## 2016-03-22 NOTE — Telephone Encounter (Signed)
Left vm for pt to call back, need to inform her that rx for Ketoconazole sent into [facility].

## 2016-04-08 NOTE — Progress Notes (Signed)
[name] [place], [place] [address] Phone: [phone] Subjective:    I'm seeing this patient by the request  of: Melissa Lacy, NP    CC: Bilateral wrist pain f/u   [id number]  Melissa [name] is a 41 y.o. female coming in with complaint of multiple joint pains and aches. Patient states though were seems to be bilateral wrist pain. Found to have carpal tunnel syndrome. Patient was to start conservative therapy. States overall seems to be doing relatively well. Mild increasing radicular symptoms but not as severe.  In addition this patient did have laboratory workup showing vitamin D and low iron. Continues to have muscle aches and pains. Patient states that it is continuing to give her difficulty. Patient states that she continues to have pain overall. Patient states that it is continuing to decrease her activity. Patient denies any numbness. Patient states though that the pain can be unrelenting.    Past Medical History:  Diagnosis Date  . Anxiety   . Arthritis   . Carpal tunnel syndrome   . Depression   . Hammer toe 04/2013   bilateral 4th and 5th   . Ingrown left big toenail 04/2013  . Mental disorder   . Neuromuscular disorder ([place])   . Post term pregnancy over 40 weeks 09/21/2015  . SVD (spontaneous vaginal delivery) 09/22/2015  . UTI (lower urinary tract infection)    Past Surgical History:  Procedure Laterality Date  . BREAST SURGERY    . CORRECTION HAMMER TOE Bilateral   . CRYOABLATION  2004  . CYSTOSCOPY WITH HYDRODISTENSION AND BIOPSY  07/28/2004   with urethral dilatation  . DILATION AND CURETTAGE OF UTERUS    . DILATION AND EVACUATION  12/21/2011   Procedure: DILATATION AND EVACUATION;  Surgeon: Terrance Mass, MD;  Location: [facility];  Service: Gynecology;  Laterality: N/A;  first trimester   . HAMMER TOE SURGERY Bilateral 04/20/2013   Procedure: BILATERAL HAMMER TOE REPAIR AND EXCISION NAIL  PERMANENT LEFT FIRST;  Surgeon: Harriet Masson, DPM;  Location: [facility];  Service: Podiatry;  Laterality: Bilateral;  Hammer toe repair fourth and fifth toes bilateral feet with screw placement fourth toes and partial nail excision left great toe  . HYSTEROSALPINGOGRAM    . TOE SURGERY     ingrown toenail  . WISDOM TOOTH EXTRACTION     Social History   Social History  . Marital status: Single    Spouse name: N/A  . Number of children: N/A  . Years of education: N/A   Social History Main Topics  . Smoking status: Never Smoker  . Smokeless tobacco: Never Used  . Alcohol use No     Comment: occasionally  . Drug use: No  . Sexual activity: Yes    Birth control/ protection: IUD   Other Topics Concern  . None   Social History Narrative  . None   No Known Allergies Family History  Problem Relation Age of Onset  . Cancer Father     lung cancer  . Birth defects Paternal Grandfather   . Seizures Mother   . Arthritis Mother   . Thyroid disease Maternal Grandmother   . Heart disease Paternal Grandmother     2 CABG    Past medical history, social, surgical and family history all reviewed in electronic medical record.  No pertanent information unless stated regarding to the chief complaint.   Review of Systems: No visual changes,  nausea, vomiting, diarrhea, constipation, dizziness, abdominal pain, skin rash, fevers, chills, night sweats, weight loss, swollen lymph nodes,=, chest pain, shortness of breath, mood changes.  Pain with muscle aches and body aches, headaches,  Objective  Blood pressure 116/70, pulse 66, resp. rate 16, weight 178 lb 6 oz (80.9 kg), SpO2 99 %, unknown if currently breastfeeding.   Systems examined below as of 04/09/16 General: NAD A&O x3 mood, affect normal  HEENT: Pupils equal, extraocular movements intact no nystagmus Respiratory: not short of breath at rest or with speaking Cardiovascular: No lower extremity edema, non  tender Skin: Warm dry intact with no signs of infection or rash on extremities or on axial skeleton. Abdomen: Soft nontender, no masses Neuro: Cranial nerves  intact, neurovascularly intact in all extremities with 2+ DTRs and 2+ pulses. Lymph: No lymphadenopathy appreciated today  Gait normal with good balance and coordination.  MSK: tender with full range of motion and good stability and symmetric strength and tone of shoulders, elbows, wrist,  knee hips and ankles bilaterally.  Worse diffuse tenderness of multiple joints and muscles. Wrist: Bilateral Inspection normal with no visible erythema or swelling. ROM smooth and normal with good flexion and extension and ulnar/radial deviation that is symmetrical with opposite wrist. Palpation is normal over metacarpals, navicular, lunate, and TFCC; tendons without tenderness/ swelling No snuffbox tenderness. No tenderness over Canal of Guyon. Strength 5/5 in all directions without pain. Negative Finkelstein, positive right side tinel's and phalens. Negative Watson's test.      Impression and Recommendations:     This case required medical decision making of moderate complexity.      Note: This dictation was prepared with Dragon dictation along with smaller phrase technology. Any transcriptional errors that result from this process are unintentional.

## 2016-04-09 ENCOUNTER — Encounter: Payer: Self-pay | Admitting: Family Medicine

## 2016-04-09 ENCOUNTER — Ambulatory Visit (INDEPENDENT_AMBULATORY_CARE_PROVIDER_SITE_OTHER): Payer: Medicare Other | Admitting: Family Medicine

## 2016-04-09 VITALS — BP 116/70 | HR 66 | Resp 16 | Wt 178.4 lb

## 2016-04-09 DIAGNOSIS — G5603 Carpal tunnel syndrome, bilateral upper limbs: Secondary | ICD-10-CM

## 2016-04-09 DIAGNOSIS — M2550 Pain in unspecified joint: Secondary | ICD-10-CM | POA: Diagnosis not present

## 2016-04-09 MED ORDER — KETOROLAC TROMETHAMINE 60 MG/2ML IM SOLN
60.0000 mg | Freq: Once | INTRAMUSCULAR | Status: AC
  Administered 2016-04-09: 60 mg via INTRAMUSCULAR

## 2016-04-09 MED ORDER — METHYLPREDNISOLONE ACETATE 80 MG/ML IJ SUSP
80.0000 mg | Freq: Once | INTRAMUSCULAR | Status: AC
  Administered 2016-04-09: 80 mg via INTRAMUSCULAR

## 2016-04-09 MED ORDER — VENLAFAXINE HCL ER 37.5 MG PO CP24: 37.5000 mg | ORAL_CAPSULE | Freq: Every day | ORAL | 1 refills | Status: DC

## 2016-04-09 MED ORDER — VITAMIN D (ERGOCALCIFEROL) 1.25 MG (50000 UNIT) PO CAPS: 50000.0000 [IU] | ORAL_CAPSULE | ORAL | 0 refills | Status: DC

## 2016-04-09 MED ORDER — MONTELUKAST SODIUM 10 MG PO TABS: 10.0000 mg | ORAL_TABLET | Freq: Every day | ORAL | 3 refills | Status: DC

## 2016-04-09 NOTE — Patient Instructions (Addendum)
Good to see you  Melissa [name] is your friend.  Singulair 10 mg daily  Effexor 37.5 mg daily  Consider the Mirena 2 injections to help you today  Keep a journal of food as well.  See me again in 4 weeks and lets see how you are doing .

## 2016-04-09 NOTE — Assessment & Plan Note (Signed)
Patient continues and polyarthralgia. I do believe that there is some underlying anxiety and depression echo be contribute in. We discussed different changes and diabetic beneficial. 2 injections today help with pain. Patient will start on Effexor. Senior given today as well at this isn't potential reaction to her braces. Patient will try to increase activity as tolerated. Encourage weight loss. Patient will come back and see me again in 4 weeks.

## 2016-04-09 NOTE — Progress Notes (Signed)
Pre-visit discussion using our clinic review tool. No additional management support is needed unless otherwise documented below in the visit note.  

## 2016-04-09 NOTE — Assessment & Plan Note (Signed)
Potentially worsening symptoms. Potential injection will be needed.

## 2016-04-30 ENCOUNTER — Ambulatory Visit: Payer: Self-pay | Admitting: Registered"

## 2016-05-07 ENCOUNTER — Ambulatory Visit (INDEPENDENT_AMBULATORY_CARE_PROVIDER_SITE_OTHER): Payer: Medicare Other | Admitting: Family Medicine

## 2016-05-07 ENCOUNTER — Encounter: Payer: Self-pay | Admitting: Family Medicine

## 2016-05-07 DIAGNOSIS — M2550 Pain in unspecified joint: Secondary | ICD-10-CM

## 2016-05-07 MED ORDER — VENLAFAXINE HCL ER 75 MG PO CP24: 75.0000 mg | ORAL_CAPSULE | Freq: Every day | ORAL | 3 refills | Status: DC

## 2016-05-07 NOTE — Progress Notes (Signed)
[name] [place], [place] [address] Phone: [phone] Subjective:    I'm seeing this patient by the request  of: Nche, Charlene Brooke, NP    CC: Bilateral wrist pain f/u   [id number]  Melissa [name] is a 41 y.o. female coming in with complaint of multiple joint pains and aches. Patient states though were seems to be bilateral wrist pain. Found to have carpal tunnel syndrome. Patient was to start conservative therapy. States overall seems to be doing relatively well. Mild increasing radicular symptoms but not as severe.  In addition this patient did have laboratory workup showing vitamin D and low iron.  Patient has been taking these medications as well as now taking Effexor. Felt like for one week she was on the Effexor she had no pain whatsoever. Now the pain is started slowly coming back. Feels that the midday she starts having increasing pain. When she takes medications in the morning she seems to do better. Patient denies any new symptoms. States that even the carpal tunnel has improved.    Past Medical History:  Diagnosis Date  . Anxiety   . Arthritis   . Carpal tunnel syndrome   . Depression   . Hammer toe 04/2013   bilateral 4th and 5th   . Ingrown left big toenail 04/2013  . Mental disorder   . Neuromuscular disorder ([place])   . Post term pregnancy over 40 weeks 09/21/2015  . SVD (spontaneous vaginal delivery) 09/22/2015  . UTI (lower urinary tract infection)    Past Surgical History:  Procedure Laterality Date  . BREAST SURGERY    . CORRECTION HAMMER TOE Bilateral   . CRYOABLATION  2004  . CYSTOSCOPY WITH HYDRODISTENSION AND BIOPSY  07/28/2004   with urethral dilatation  . DILATION AND CURETTAGE OF UTERUS    . DILATION AND EVACUATION  12/21/2011   Procedure: DILATATION AND EVACUATION;  Surgeon: Terrance Mass, MD;  Location: [facility];  Service: Gynecology;  Laterality: N/A;  first trimester   .  HAMMER TOE SURGERY Bilateral 04/20/2013   Procedure: BILATERAL HAMMER TOE REPAIR AND EXCISION NAIL PERMANENT LEFT FIRST;  Surgeon: Harriet Masson, DPM;  Location: [place];  Service: Podiatry;  Laterality: Bilateral;  Hammer toe repair fourth and fifth toes bilateral feet with screw placement fourth toes and partial nail excision left great toe  . HYSTEROSALPINGOGRAM    . TOE SURGERY     ingrown toenail  . WISDOM TOOTH EXTRACTION     Social History   Social History  . Marital status: Single    Spouse name: N/A  . Number of children: N/A  . Years of education: N/A   Social History Main Topics  . Smoking status: Never Smoker  . Smokeless tobacco: Never Used  . Alcohol use No     Comment: occasionally  . Drug use: No  . Sexual activity: Yes    Birth control/ protection: IUD   Other Topics Concern  . Not on file   Social History Narrative  . No narrative on file   No Known Allergies Family History  Problem Relation Age of Onset  . Cancer Father     lung cancer  . Birth defects Paternal Grandfather   . Seizures Mother   . Arthritis Mother   . Thyroid disease Maternal Grandmother   . Heart disease Paternal Grandmother     2 CABG    Past medical history, social, surgical and  family history all reviewed in electronic medical record.  No pertanent information unless stated regarding to the chief complaint.   Review of Systems: No headache, visual changes, nausea, vomiting, diarrhea, constipation, dizziness, abdominal pain, skin rash, fevers, chills, night sweats, weight loss, swollen lymph nodes, body aches, joint swelling, chest pain, shortness of breath, mood changes.  Positive muscle aches  Objective  Blood pressure 108/86, pulse 65, resp. rate 16, weight 176 lb (79.8 kg), SpO2 98 %, unknown if currently breastfeeding.   Systems examined below as of 05/07/16 General: NAD A&O x3 mood, affect normal  HEENT: Pupils equal, extraocular movements intact no  nystagmus Respiratory: not short of breath at rest or with speaking Cardiovascular: No lower extremity edema, non tender Skin: Warm dry intact with no signs of infection or rash on extremities or on axial skeleton. Abdomen: Soft nontender, no masses Neuro: Cranial nerves  intact, neurovascularly intact in all extremities with 2+ DTRs and 2+ pulses. Lymph: No lymphadenopathy appreciated today  Gait normal with good balance and coordination.  MSK: Non tender with full range of motion and good stability and symmetric strength and tone of shoulders, elbows, wrist,  knee hips and ankles bilaterally.          Impression and Recommendations:     This case required medical decision making of moderate complexity.      Note: This dictation was prepared with Dragon dictation along with smaller phrase technology. Any transcriptional errors that result from this process are unintentional.

## 2016-05-07 NOTE — Patient Instructions (Addendum)
Good to see you effexor 75mg  now.  You are doing great and it seems we are on the right path  Keep doing what you are doing and stay active.  See me again in 4 weeks.

## 2016-05-07 NOTE — Assessment & Plan Note (Signed)
Patient is actually doing better overall. We did discuss with patient at great length about the other medications. We'll start and decreased some other medications if possible but continue the Effexor and increase to 75 mg. I that this will be beneficial. We discussed icing regimen, home exercises, which activities to do in which ones to avoid. Discussed diet changes. Patient will follow-up with me again in 4 weeks.  Spent  25 minutes with patient face-to-face and had greater than 50% of counseling including as described above in assessment and plan.

## 2016-06-09 NOTE — Progress Notes (Signed)
[name] [name] [address] Phone: [phone] Subjective:    I'm seeing this patient by the request  of: Nche, Charlene Brooke, NP    CC: Bilateral wrist pain f/u   [id number]  Melissa [name] is a 41 y.o. female coming in with complaint of multiple joint pains and aches. Patient states though were seems to be bilateral wrist pain. Found to have carpal tunnel syndrome.  States that this seems to be doing relatively well.  Patient was found to have more pain now on the radial aspect of the wrist. Patient states that it is a nagging pain. States that sometimes she wishes it was not bare. Patient states that certain daily activities and can wake her up at night. Normal feels like a burning sensation most of the time. An states that it seems to be on the dorsal aspect of the forearm as well.  In addition this patient did have laboratory workup showing vitamin D and low iron.  Patient has been taking these medications as well as now taking Effexor. Felt like for one week she was on the Effexor states that it had been helping. Increase to 75 mg. Has helped with some of the numbness she was having in the fingers as well as some of the aches and pains of her body.  Past Medical History:  Diagnosis Date  . Anxiety   . Arthritis   . Carpal tunnel syndrome   . Depression   . Hammer toe 04/2013   bilateral 4th and 5th   . Ingrown left big toenail 04/2013  . Mental disorder   . Neuromuscular disorder ([place])   . Post term pregnancy over 40 weeks 09/21/2015  . SVD (spontaneous vaginal delivery) 09/22/2015  . UTI (lower urinary tract infection)    Past Surgical History:  Procedure Laterality Date  . BREAST SURGERY    . CORRECTION HAMMER TOE Bilateral   . CRYOABLATION  2004  . CYSTOSCOPY WITH HYDRODISTENSION AND BIOPSY  07/28/2004   with urethral dilatation  . DILATION AND CURETTAGE OF UTERUS    . DILATION AND EVACUATION  12/21/2011   Procedure: DILATATION AND EVACUATION;  Surgeon: Terrance Mass, MD;  Location: [facility];  Service: Gynecology;  Laterality: N/A;  first trimester   . HAMMER TOE SURGERY Bilateral 04/20/2013   Procedure: BILATERAL HAMMER TOE REPAIR AND EXCISION NAIL PERMANENT LEFT FIRST;  Surgeon: Harriet Masson, DPM;  Location: [place];  Service: Podiatry;  Laterality: Bilateral;  Hammer toe repair fourth and fifth toes bilateral feet with screw placement fourth toes and partial nail excision left great toe  . HYSTEROSALPINGOGRAM    . TOE SURGERY     ingrown toenail  . WISDOM TOOTH EXTRACTION     Social History   Social History  . Marital status: Single    Spouse name: N/A  . Number of children: N/A  . Years of education: N/A   Social History Main Topics  . Smoking status: Never Smoker  . Smokeless tobacco: Never Used  . Alcohol use No     Comment: occasionally  . Drug use: No  . Sexual activity: Yes    Birth control/ protection: IUD   Other Topics Concern  . Not on file   Social History Narrative  . No narrative on file   No Known Allergies Family History  Problem Relation Age of Onset  . Cancer Father  lung cancer  . Birth defects Paternal Grandfather   . Seizures Mother   . Arthritis Mother   . Thyroid disease Maternal Grandmother   . Heart disease Paternal Grandmother        2 CABG    Past medical history, social, surgical and family history all reviewed in electronic medical record.  No pertanent information unless stated regarding to the chief complaint.   Review of Systems: No headache, visual changes, nausea, vomiting, diarrhea, constipation, dizziness, abdominal pain, skin rash, fevers, chills, night sweats, weight loss, swollen lymph nodes, body aches, joint swelling,  chest pain, shortness of breath, mood changes.  Positive muscle aches  Objective  unknown if currently breastfeeding.   Systems examined below as of  06/11/16 General: NAD A&O x3 mood, affect normal  HEENT: Pupils equal, extraocular movements intact no nystagmus Respiratory: not short of breath at rest or with speaking Cardiovascular: No lower extremity edema, non tender Skin: Warm dry intact with no signs of infection or rash on extremities or on axial skeleton. Abdomen: Soft nontender, no masses Neuro: Cranial nerves  intact, neurovascularly intact in all extremities with 2+ DTRs and 2+ pulses. Lymph: No lymphadenopathy appreciated today  Gait normal with good balance and coordination.  MSK: tender with full range of motion and good stability and symmetric strength and tone of , elbows,  knee hips and ankles bilaterally.   Wrist: Right Inspection normal with no visible erythema or swelling. ROM smooth and normal with good flexion and extension and ulnar/radial deviation that is symmetrical with opposite wrist. Pain over the dorsal aspect of the wrist. Pain with resisted extension No snuffbox tenderness. No tenderness over Canal of Guyon. Strength 5/5 in all directions without pain. Negative Finkelstein, tinel's and phalens. Negative Watson's test. Contralateral wrist unremarkable  Procedure: Real-time Ultrasound Guided Injection of right extensor tendon sheath of the wrist Device: GE Logiq Q7 Ultrasound guided injection is preferred based studies that show increased duration, increased effect, greater accuracy, decreased procedural pain, increased response rate, and decreased cost with ultrasound guided versus blind injection.  Verbal informed consent obtained.  Time-out conducted.  Noted no overlying erythema, induration, or other signs of local infection.  Skin prepped in a sterile fashion.  Local anesthesia: Topical Ethyl chloride.  With sterile technique and under real time ultrasound guidance:  With a 25-gauge half-inch needle patient was injected with a total of 0.5 mL of 0.5% Marcaine and 0.5 mL of Kenalog 40 mg's per  deciliter. Completed without difficulty  Pain immediately resolved suggesting accurate placement of the medication.  Advised to call if fevers/chills, erythema, induration, drainage, or persistent bleeding.  Images permanently stored and available for review in the ultrasound unit.  Impression: Technically successful ultrasound guided injection.       Impression and Recommendations:     This case required medical decision making of moderate complexity.      Note: This dictation was prepared with Dragon dictation along with smaller phrase technology. Any transcriptional errors that result from this process are unintentional.

## 2016-06-11 ENCOUNTER — Ambulatory Visit (INDEPENDENT_AMBULATORY_CARE_PROVIDER_SITE_OTHER): Payer: Medicare Other | Admitting: Family Medicine

## 2016-06-11 ENCOUNTER — Encounter: Payer: Self-pay | Admitting: Family Medicine

## 2016-06-11 ENCOUNTER — Ambulatory Visit: Payer: Self-pay

## 2016-06-11 VITALS — BP 122/84 | HR 84

## 2016-06-11 DIAGNOSIS — M779 Enthesopathy, unspecified: Secondary | ICD-10-CM

## 2016-06-11 DIAGNOSIS — M25531 Pain in right wrist: Secondary | ICD-10-CM

## 2016-06-11 DIAGNOSIS — M778 Other enthesopathies, not elsewhere classified: Secondary | ICD-10-CM | POA: Insufficient documentation

## 2016-06-11 MED ORDER — ALLOPURINOL 100 MG PO TABS
100.0000 mg | ORAL_TABLET | Freq: Every day | ORAL | 6 refills | Status: DC
Start: 2016-06-11 — End: 2016-09-04

## 2016-06-11 NOTE — Assessment & Plan Note (Signed)
Patient given injection today. I do believe that this was causing some compression of the radial nerve that was likely contributing to some of the pain. This was given her more of the radial tunnel syndrome. We discussed icing regimen. Continuing the gabapentin as well as the Effexor. We discussed icing regimen. Started on allopurinol in case there is any type of uric acid. Follow-up again in 4 weeks.

## 2016-06-11 NOTE — Patient Instructions (Signed)
Good to see you  Alvera Singh is your friend.  Tried an injection and I hope it helps Allopurinol 100mg  daily  Tart cherry extract any dose at night See me again in 3-4 weeks

## 2016-06-12 ENCOUNTER — Ambulatory Visit (INDEPENDENT_AMBULATORY_CARE_PROVIDER_SITE_OTHER): Payer: Medicare Other

## 2016-06-12 ENCOUNTER — Ambulatory Visit (INDEPENDENT_AMBULATORY_CARE_PROVIDER_SITE_OTHER): Payer: Medicare Other | Admitting: Podiatry

## 2016-06-12 ENCOUNTER — Encounter: Payer: Self-pay | Admitting: Podiatry

## 2016-06-12 DIAGNOSIS — M2041 Other hammer toe(s) (acquired), right foot: Secondary | ICD-10-CM

## 2016-06-12 DIAGNOSIS — M2042 Other hammer toe(s) (acquired), left foot: Secondary | ICD-10-CM

## 2016-06-12 NOTE — Patient Instructions (Signed)

## 2016-06-12 NOTE — Progress Notes (Signed)
She presents with continued pain to the bilateral foot. She is concerned about the first metatarsophalangeal joint as well as the curving of her toe and she refers to the hallux. She is also concerned about the fourth and fifth digits of the right foot which were repaired previously but are now in a contracted position.  Objective: I have reviewed her past medical history medications allergies surgeries social history. Radiographs were reviewed once again today she has adductovarus rotated hammertoe deformity with a history of an arthroplasty fifth digit right foot history of an arthroplasty fourth digit right foot which has lost its extensor capability and is in a plantarflexed position. Her left foot does demonstrate mild hallux valgus deformity and a hallux interphalangeum. Radiographs confirm this.  Assessment: Hallux interphalangeum. Hallux abductovalgus deformity left foot. Hammertoe deformity fourth and fifth digits right foot.  Plan: Discussed etiology pathology conservative versus surgical therapies. At this point we consented her for [facility] bunion repair left foot and a Akin osteotomy left foot. Hammertoe repair fourth and fifth digits of the right foot. We discussed the possible postoperative complications which may include but are not limited to postop pain bleeding swelling infection recurrence overcorrection under correction. She understands this and is amenable to it. She saw Dr. page of the consent form and but would like to do this in the near future.

## 2016-06-14 ENCOUNTER — Telehealth: Payer: Self-pay | Admitting: *Deleted

## 2016-06-14 NOTE — Telephone Encounter (Signed)
Left message informing pt of Dr Stephenie Acres recommendation.

## 2016-06-14 NOTE — Telephone Encounter (Signed)
Would need to see it before surgery to consent it.

## 2016-06-14 NOTE — Telephone Encounter (Signed)
Pt states she forgot to inform her doctor that her right 3rd toe rubs and hurts, and has a slight hammer toe, is this something he might want to fix at 10/2016 surgery.

## 2016-07-09 ENCOUNTER — Ambulatory Visit: Payer: Medicare Other | Admitting: Family Medicine

## 2016-08-22 ENCOUNTER — Telehealth: Payer: Self-pay | Admitting: Nurse Practitioner

## 2016-08-22 DIAGNOSIS — F319 Bipolar disorder, unspecified: Secondary | ICD-10-CM

## 2016-08-22 NOTE — Telephone Encounter (Signed)
Referral enter as request.

## 2016-08-22 NOTE — Telephone Encounter (Signed)
Please advise. okey for referral?

## 2016-08-22 NOTE — Telephone Encounter (Signed)
Pt tried to make an appointment with a psychiatrists and couldn't because of her insurance She would like to be referred to [facility] to Dr. Albertine Patricia    [phone] Please advise

## 2016-08-22 NOTE — Telephone Encounter (Signed)
ok 

## 2016-09-04 ENCOUNTER — Encounter (HOSPITAL_COMMUNITY): Payer: Self-pay | Admitting: Psychiatry

## 2016-09-04 ENCOUNTER — Ambulatory Visit (INDEPENDENT_AMBULATORY_CARE_PROVIDER_SITE_OTHER): Payer: Medicare Other | Admitting: Psychiatry

## 2016-09-04 VITALS — BP 122/78 | HR 72 | Ht 61.0 in | Wt 177.2 lb

## 2016-09-04 DIAGNOSIS — F4312 Post-traumatic stress disorder, chronic: Secondary | ICD-10-CM | POA: Diagnosis not present

## 2016-09-04 DIAGNOSIS — Z8659 Personal history of other mental and behavioral disorders: Secondary | ICD-10-CM

## 2016-09-04 DIAGNOSIS — Z62810 Personal history of physical and sexual abuse in childhood: Secondary | ICD-10-CM | POA: Diagnosis not present

## 2016-09-04 DIAGNOSIS — Z975 Presence of (intrauterine) contraceptive device: Secondary | ICD-10-CM

## 2016-09-04 MED ORDER — VENLAFAXINE HCL ER 150 MG PO CP24: 150.0000 mg | ORAL_CAPSULE | Freq: Every day | ORAL | 0 refills | Status: DC

## 2016-09-04 MED ORDER — CLONIDINE HCL 0.1 MG PO TABS: 0.1000 mg | ORAL_TABLET | Freq: Every day | ORAL | 0 refills | Status: DC
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
## 2016-09-11 ENCOUNTER — Encounter: Payer: Self-pay | Admitting: Podiatry

## 2016-09-11 ENCOUNTER — Ambulatory Visit (INDEPENDENT_AMBULATORY_CARE_PROVIDER_SITE_OTHER): Payer: Medicare Other | Admitting: Podiatry

## 2016-09-11 DIAGNOSIS — N898 Other specified noninflammatory disorders of vagina: Secondary | ICD-10-CM | POA: Insufficient documentation

## 2016-09-11 DIAGNOSIS — M2041 Other hammer toe(s) (acquired), right foot: Secondary | ICD-10-CM

## 2016-09-11 DIAGNOSIS — M2042 Other hammer toe(s) (acquired), left foot: Secondary | ICD-10-CM

## 2016-09-11 NOTE — Progress Notes (Signed)
She presents today to discuss adding hammertoe repair third toe right foot and fourth toe left foot to her already existing surgeries consent.  Objective: Hammertoe deformities are present #3 right #4 left. She would like these to be surgically corrected. I reviewed radiographs today demonstrating these hammertoe deformities.  Assessment: Hammertoe deformities 345 right #4 left.  Plan: She signed a consent form today follow up with her October 19.

## 2016-09-14 NOTE — Telephone Encounter (Addendum)
Pt left message with name and phone number only. 09/14/2016-Left message informing pt, if she would leave a question, often I could call back with an answer. Pt called asked if Dr. Milinda Pointer had on her procedures to fix a stubborn left 1st ingrown toenail. I reviewed pt's scheduled surgery with D. Meadows - Surgical Coordinator and no ingrown toenail procedure was listed. I informed pt Dr. Milinda Pointer would want he to come in to sign consents and pt asked that I check with Dr. Milinda Pointer if can sign at surgical center. 09/17/2016-I informed pt that Dr. Marta Antu the left 1st toenail procedure to be signed for at the surgical center, and I would inform the surgery coordinator that the procedure needed to be put on the scheduling sheet and consent form to be signed for at the time surgery.

## 2016-09-17 NOTE — Telephone Encounter (Signed)
Yes but it needs to be put on posting sheet.

## 2016-09-20 NOTE — Telephone Encounter (Signed)
I am returning your call.  How can I help you.  "It's already been taken care of, someone has already called me and everything is straight for my surgery date."

## 2016-10-09 ENCOUNTER — Ambulatory Visit (INDEPENDENT_AMBULATORY_CARE_PROVIDER_SITE_OTHER): Payer: Medicare Other | Admitting: Nurse Practitioner

## 2016-10-09 ENCOUNTER — Encounter: Payer: Self-pay | Admitting: Nurse Practitioner

## 2016-10-09 VITALS — BP 108/78 | HR 68 | Temp 98.2°F | Ht 61.0 in | Wt 181.0 lb

## 2016-10-09 DIAGNOSIS — Q828 Other specified congenital malformations of skin: Secondary | ICD-10-CM

## 2016-10-09 DIAGNOSIS — R61 Generalized hyperhidrosis: Secondary | ICD-10-CM | POA: Diagnosis not present

## 2016-10-09 DIAGNOSIS — E559 Vitamin D deficiency, unspecified: Secondary | ICD-10-CM | POA: Diagnosis not present

## 2016-10-09 DIAGNOSIS — E669 Obesity, unspecified: Secondary | ICD-10-CM

## 2016-10-09 DIAGNOSIS — Z6834 Body mass index (BMI) 34.0-34.9, adult: Secondary | ICD-10-CM

## 2016-10-09 MED ORDER — FLUCONAZOLE 100 MG PO TABS: 100.0000 mg | ORAL_TABLET | ORAL | 0 refills | Status: DC

## 2016-10-09 MED ORDER — PHENTERMINE HCL 37.5 MG PO CAPS: 37.5000 mg | ORAL_CAPSULE | ORAL | 0 refills | Status: DC

## 2016-10-09 NOTE — Patient Instructions (Signed)
Return to lab in 1week for blood draw.

## 2016-10-09 NOTE — Progress Notes (Signed)
Subjective:  Patient ID: Melissa [name], female    DOB: [birth year]  Age: 41 y.o. MRN: [id number]  CC: Weight Loss (weight loss consult--didnt pick up last rx--work out 4 days a week,eat good cant loss weight---flu shot?); Excessive Sweating (sweating at night (only)--hormone problem? ); and Rash (rash all over body. using pill better)   HPI Weight loss management: Did not use phentermine Rx given 02/2016. Need new Rx. She has made chnages to diet and increased exercise regimen, but unsuccessful with weight loss.  Hot flashes: Onset 2014, Intermittent. Getting worse. No respiratory symptoms, no GI/GU symptoms. Regular menstrual cycle. Current use of IUD (inserted 10/2015) effexor and clonidine started 09/2016.  Rash: Recurrent Some improvement with ketoconazole shampoo. She will like to try oral antifungal.  Outpatient Medications Prior to Visit  Medication Sig Dispense Refill  . cloNIDine (CATAPRES) 0.1 MG tablet Take 1 tablet (0.1 mg total) by mouth at bedtime. 90 tablet 0  . mirabegron ER (MYRBETRIQ) 25 MG TB24 tablet Myrbetriq 25 mg tablet,extended release    . venlafaxine XR (EFFEXOR-XR) 150 MG 24 hr capsule Take 1 capsule (150 mg total) by mouth daily with breakfast. 90 capsule 0   No facility-administered medications prior to visit.     ROS See HPI  Objective:  BP 108/78   Pulse 68   Temp 98.2 F (36.8 C)   Ht 5\' 1"  (1.549 m)   Wt 181 lb (82.1 kg)   SpO2 99%   BMI 34.20 kg/m   BP Readings from Last 3 Encounters:  10/09/16 108/78  06/11/16 122/84  05/07/16 108/86    Wt Readings from Last 3 Encounters:  10/09/16 181 lb (82.1 kg)  05/07/16 176 lb (79.8 kg)  04/09/16 178 lb 6 oz (80.9 kg)    Physical Exam  Constitutional: She is oriented to person, place, and time. No distress.  Neck: Normal range of motion. Neck supple. No thyromegaly present.  Cardiovascular: Normal rate and regular rhythm.   Pulmonary/Chest: Effort normal and breath sounds  normal.  Abdominal: Soft. Bowel sounds are normal. She exhibits no distension.  Musculoskeletal: Normal range of motion. She exhibits no edema.  Lymphadenopathy:    She has no cervical adenopathy.  Neurological: She is alert and oriented to person, place, and time.  Skin: Skin is warm and dry. Rash noted. Rash is maculopapular.  Psychiatric: She has a normal mood and affect. Her behavior is normal.  Vitals reviewed.   Lab Results  Component Value Date   WBC 6.4 12/12/2015   HGB 13.5 12/12/2015   HCT 39.7 12/12/2015   PLT 276.0 12/12/2015   GLUCOSE 90 12/12/2015   CHOL 176 02/19/2014   TRIG 64 02/19/2014   HDL 43 02/19/2014   LDLCALC 120 (H) 02/19/2014   ALT 36 (H) 12/12/2015   AST 28 12/12/2015   NA 136 12/12/2015   K 4.2 12/12/2015   CL 103 12/12/2015   CREATININE 0.66 12/12/2015   BUN 9 12/12/2015   CO2 26 12/12/2015   TSH 1.25 12/12/2015   HGBA1C 5.5 12/12/2015    US Breast Ltd Uni Right Inc Axilla  Result Date: 02/06/2016 CLINICAL DATA:  41 year old female with far lateral/ axillary right breast pain. The patient stopped breastfeeding about 1 month ago. The patient was last seen in November 2017 at which time multiple aspirations of a right breast galactocele were performed. EXAM: DIGITAL DIAGNOSTIC RIGHT MAMMOGRAM WITH CAD ULTRASOUND RIGHT BREAST COMPARISON:  Previous exam(s). ACR Breast Density Category c: The breast tissue  is heterogeneously dense, which may obscure small masses. FINDINGS: There is increased density in the subareolar right breast. This is likely in the area of prior galactocele given its location on ultrasound at 12 o'clock, 1 cm from the nipple. Spot compression views of the central right breast demonstrate no suspicious mass in the area of concern. Findings are thought to likely reflect evolving changes following resolution of the patient's large galactocele. No suspicious mass, calcifications, or other abnormality is identified within the left breast.  Mammographic images were processed with CAD. On physical exam, no discrete mass is felt in the subareolar right breast or in the patient's area of pain in the far lateral/ low axillary right breast. Targeted ultrasound of the subareolar right breast demonstrate no suspicious cystic or solid mass. Targeted ultrasound of the patient's area of concern in the lateral right breast/lower axilla demonstrates a prominent right axillary lymph node with cortical thickening measuring up to 4 mm. This lymph node is thought likely reactive. IMPRESSION: Probably benign right breast asymmetry and right axillary lymph node. RECOMMENDATION: Right diagnostic mammogram and ultrasound in 6 months. I have discussed the findings and recommendations with the patient. Results were also provided in writing at the conclusion of the visit. If applicable, a reminder letter will be sent to the patient regarding the next appointment. BI-RADS CATEGORY  3: Probably benign. Electronically Signed   By: Pamelia Hoit M.D.   On: 02/06/2016 17:02   Mm Diag Breast Tomo Bilateral  Result Date: 02/06/2016 CLINICAL DATA:  41 year old female with far lateral/ axillary right breast pain. The patient stopped breastfeeding about 1 month ago. The patient was last seen in November 2017 at which time multiple aspirations of a right breast galactocele were performed. EXAM: DIGITAL DIAGNOSTIC RIGHT MAMMOGRAM WITH CAD ULTRASOUND RIGHT BREAST COMPARISON:  Previous exam(s). ACR Breast Density Category c: The breast tissue is heterogeneously dense, which may obscure small masses. FINDINGS: There is increased density in the subareolar right breast. This is likely in the area of prior galactocele given its location on ultrasound at 12 o'clock, 1 cm from the nipple. Spot compression views of the central right breast demonstrate no suspicious mass in the area of concern. Findings are thought to likely reflect evolving changes following resolution of the patient's large  galactocele. No suspicious mass, calcifications, or other abnormality is identified within the left breast. Mammographic images were processed with CAD. On physical exam, no discrete mass is felt in the subareolar right breast or in the patient's area of pain in the far lateral/ low axillary right breast. Targeted ultrasound of the subareolar right breast demonstrate no suspicious cystic or solid mass. Targeted ultrasound of the patient's area of concern in the lateral right breast/lower axilla demonstrates a prominent right axillary lymph node with cortical thickening measuring up to 4 mm. This lymph node is thought likely reactive. IMPRESSION: Probably benign right breast asymmetry and right axillary lymph node. RECOMMENDATION: Right diagnostic mammogram and ultrasound in 6 months. I have discussed the findings and recommendations with the patient. Results were also provided in writing at the conclusion of the visit. If applicable, a reminder letter will be sent to the patient regarding the next appointment. BI-RADS CATEGORY  3: Probably benign. Electronically Signed   By: Pamelia Hoit M.D.   On: 02/06/2016 17:02    Assessment & Plan:   Marriana was seen today for weight loss, excessive sweating and rash.  Diagnoses and all orders for this visit:  Class 1 obesity without serious  comorbidity with body mass index (BMI) of 34.0 to 34.9 in adult, unspecified obesity type -     phentermine 37.5 MG capsule; Take 1 capsule (37.5 mg total) by mouth every morning.  Chronic night sweats -     Basic metabolic panel; Future -     Hemoglobin A1c; Future -     TSH; Future -     FSH; Future -     Prolactin; Future -     Testosterone; Future  Vitamin D insufficiency -     Vitamin D 1,25 dihydroxy; Future  Keratosis follicularis -     Hepatic function panel; Future -     fluconazole (DIFLUCAN) 100 MG tablet; Take 1 tablet (100 mg total) by mouth once a week.   I am having Ms. Proby start on phentermine  and fluconazole. I am also having her maintain her venlafaxine XR, cloNIDine, and mirabegron ER.  Meds ordered this encounter  Medications  . phentermine 37.5 MG capsule    Sig: Take 1 capsule (37.5 mg total) by mouth every morning.    Dispense:  30 capsule    Refill:  0    Order Specific Question:   Supervising Provider    Answer:   Cassandria Anger [[id number]]  . fluconazole (DIFLUCAN) 100 MG tablet    Sig: Take 1 tablet (100 mg total) by mouth once a week.    Dispense:  4 tablet    Refill:  0    Order Specific Question:   Supervising Provider    Answer:   Cassandria Anger [[id number]]    Follow-up: Return in about 4 weeks (around 11/06/2016) for weight loss and rash.  Wilfred Lacy, NP

## 2016-10-10 ENCOUNTER — Telehealth: Payer: Self-pay | Admitting: Nurse Practitioner

## 2016-10-10 DIAGNOSIS — R110 Nausea: Secondary | ICD-10-CM

## 2016-10-10 NOTE — Telephone Encounter (Signed)
Pt report feeling nausea,feels like throwing up cant, headache and bloated going on for 1 wk now. Pt stated she has IUD in and this is something new to her.   Please advise.

## 2016-10-10 NOTE — Telephone Encounter (Signed)
Pt called stating that she was here for an appointment with [facility] yesterday but she forgot to mention something to her and wanted to talk with you about it. Can you call her when you have a chance?

## 2016-10-11 MED ORDER — ONDANSETRON HCL 4 MG PO TABS: 4.0000 mg | ORAL_TABLET | Freq: Three times a day (TID) | ORAL | 0 refills | Status: DC | PRN

## 2016-10-11 NOTE — Telephone Encounter (Signed)
Pt is agree to take zofran, please send to [facility].

## 2016-10-11 NOTE — Telephone Encounter (Signed)
Symptoms could be related to recent increase in effexor dose. I do not think that is related to IUD for that was inserted almost 12yr ago. I can send zofran for nausea. Let me know if she is ok with that?

## 2016-10-17 ENCOUNTER — Other Ambulatory Visit: Payer: Self-pay | Admitting: Podiatry

## 2016-10-17 MED ORDER — CEPHALEXIN 500 MG PO CAPS: 500.0000 mg | ORAL_CAPSULE | Freq: Three times a day (TID) | ORAL | 0 refills | Status: DC

## 2016-10-17 MED ORDER — HYDROMORPHONE HCL 4 MG PO TABS: 4.0000 mg | ORAL_TABLET | ORAL | 0 refills | Status: DC | PRN

## 2016-10-17 MED ORDER — ONDANSETRON HCL 4 MG PO TABS: 4.0000 mg | ORAL_TABLET | Freq: Three times a day (TID) | ORAL | 0 refills | Status: DC | PRN

## 2016-10-18 ENCOUNTER — Telehealth: Payer: Self-pay | Admitting: *Deleted

## 2016-10-18 NOTE — Telephone Encounter (Signed)
"  I don't know the address of where I'm supposed to go."  You are going to [address].  You should have their brochure in the bag we gave you.  "Oh okay, what time am I supposed to be there, 5 am?"  I am not sure.  You should have received a call from someone at the surgical center about the time.  I will call and let them know that you are inquiring.    I spoke to [place] at the surgical center and she said you need to be there at 5:45 am.  "Wow, that is early.  I'll be there."

## 2016-10-19 ENCOUNTER — Telehealth: Payer: Self-pay | Admitting: *Deleted

## 2016-10-19 ENCOUNTER — Encounter: Payer: Self-pay | Admitting: Podiatry

## 2016-10-19 ENCOUNTER — Telehealth: Payer: Self-pay | Admitting: Podiatry

## 2016-10-19 DIAGNOSIS — M2042 Other hammer toe(s) (acquired), left foot: Secondary | ICD-10-CM | POA: Diagnosis not present

## 2016-10-19 DIAGNOSIS — M2012 Hallux valgus (acquired), left foot: Secondary | ICD-10-CM | POA: Diagnosis not present

## 2016-10-19 DIAGNOSIS — L600 Ingrowing nail: Secondary | ICD-10-CM | POA: Diagnosis not present

## 2016-10-19 DIAGNOSIS — M2022 Hallux rigidus, left foot: Secondary | ICD-10-CM | POA: Diagnosis not present

## 2016-10-19 DIAGNOSIS — M2041 Other hammer toe(s) (acquired), right foot: Secondary | ICD-10-CM | POA: Diagnosis not present

## 2016-10-19 NOTE — Telephone Encounter (Signed)
[place] states pt took rx with her. Pt states [facility] went to [facility] on [address] and the said they would fill the pain medication but gave rx back go pt. I spoke with [facility] - [facility] and she said she doesn't see rx in the system, but will check. [facility] - [facility] states this rx as written is 14mEq of Morphine and she will refill it, it is right on the edge and will not equal the 66mEq recommended. [place] states will need verbal that Dr. Milinda Pointer had discussed the risk of the Dilaudid and dx codes. I told Alician, Dr. Milinda Pointer discuss the surgery and the pain medication risk with pt and dx codes were M20.41, M20.42. I informed pt to have her brother take the medication to the [facility] and ask for [facility].

## 2016-10-19 NOTE — Telephone Encounter (Signed)
Dr. Milinda Pointer performed foot surgery on me this morning. I took the prescription to get my pain medication filled and the pharmacy would not fill it because they said it was too high of a dose. They need some documentation stating that I need that medication due to having surgery. Please give me a call back at [phone].

## 2016-10-19 NOTE — Telephone Encounter (Signed)
Rx for dilaudid to be filled as ordered.

## 2016-10-22 ENCOUNTER — Telehealth: Payer: Self-pay | Admitting: Podiatry

## 2016-10-22 MED ORDER — IBUPROFEN 800 MG PO TABS: 800.0000 mg | ORAL_TABLET | Freq: Three times a day (TID) | ORAL | 0 refills | Status: DC | PRN

## 2016-10-22 NOTE — Telephone Encounter (Signed)
I had surgery on Friday and have not been felling well since my surgery. I have been sick all weekend and throwing up. I was calling to see if I can get some different medication. I would appreciate if it someone could call me back at [phone].

## 2016-10-22 NOTE — Telephone Encounter (Signed)
I need to speak to a nurse. I had surgery on Friday and the pain medication has not been working. I'm in excruciating pain. I have not felt good all weekend as I've been throwing up and felling sick. If someone could please call me back at [phone].

## 2016-10-22 NOTE — Telephone Encounter (Signed)
Pt states the pain medication is not helping and the nausea medication is making her feel worse. Pt described her pain as throbbing. I instructed pt to be seated, remove the cam boot, open-ended sock, and ace wrap, then elevate the surgery foot for 15 minutes, but if the pain worsened dangle for 15 minutes this being the only time it was okay to dangle, then after 15 minutes put foot level with the hip and rewrap ace looser, replace open-ended sock and boot. I told pt not to be up on the surgery foot more than 15 minutes/hour and remain in the boot when walking. Pt states no one gave her a prescription for ibuprofen and the OTC doesn't work for her. I told pt I would tell Dr. Milinda Pointer of pt's request for change of pain medication and request for ibuprofen 800mg , and she would need to bring in the remaining Dilaudid for disposal. Pt states understanding.

## 2016-10-22 NOTE — Telephone Encounter (Signed)
Motrin 800mg  tid with food and one extra strength tylenol at the same time.

## 2016-10-22 NOTE — Addendum Note (Signed)
Addended by: Harriett Sine D on: 10/22/2016 02:04 PM   Modules accepted: Orders

## 2016-10-22 NOTE — Telephone Encounter (Signed)
I informed pt of Dr. Stephenie Acres orders for Ibuprofen 800mg  and extra strength tylenol and pt states understanding.

## 2016-10-25 ENCOUNTER — Ambulatory Visit (INDEPENDENT_AMBULATORY_CARE_PROVIDER_SITE_OTHER): Payer: Medicare Other | Admitting: Podiatry

## 2016-10-25 ENCOUNTER — Ambulatory Visit (INDEPENDENT_AMBULATORY_CARE_PROVIDER_SITE_OTHER): Payer: Medicare Other

## 2016-10-25 ENCOUNTER — Encounter: Payer: Self-pay | Admitting: Podiatry

## 2016-10-25 DIAGNOSIS — M2042 Other hammer toe(s) (acquired), left foot: Secondary | ICD-10-CM | POA: Diagnosis not present

## 2016-10-25 DIAGNOSIS — M7751 Other enthesopathy of right foot: Secondary | ICD-10-CM | POA: Diagnosis not present

## 2016-10-25 DIAGNOSIS — M2041 Other hammer toe(s) (acquired), right foot: Secondary | ICD-10-CM

## 2016-10-27 NOTE — Progress Notes (Signed)
She presents today for first postop visit she is status post Akin osteotomy left McBride bunion repair excision of a soft tissue lesion lateral aspect of the hallux nail left hammertoe repairs bilaterally.  Objective: Vital signs are stable she is alert and oriented 3. Pulses are palpable. Neurologic sensorium is intact. Deep tendon reflexes are intact. Sutures are intact as well coapted there is no signs of infection. No erythema is mild edema radiographs taken today demonstrate good placement of all internal fixation.  Assessment: Well-healing surgical foot.  Plan: Redress today dressing compressive dressing bilaterally follow up with [place] in 1-2 weeks for suture removal. Continue to keep this dry and clean.

## 2016-10-29 ENCOUNTER — Ambulatory Visit (HOSPITAL_COMMUNITY): Payer: Self-pay | Admitting: Psychiatry

## 2016-11-01 ENCOUNTER — Ambulatory Visit (INDEPENDENT_AMBULATORY_CARE_PROVIDER_SITE_OTHER): Payer: Medicare Other | Admitting: Podiatry

## 2016-11-01 DIAGNOSIS — M2042 Other hammer toe(s) (acquired), left foot: Secondary | ICD-10-CM

## 2016-11-01 DIAGNOSIS — M2041 Other hammer toe(s) (acquired), right foot: Secondary | ICD-10-CM

## 2016-11-01 MED ORDER — HYDROMORPHONE HCL 4 MG PO TABS: 4.0000 mg | ORAL_TABLET | ORAL | 0 refills | Status: DC | PRN

## 2016-11-01 MED ORDER — IBUPROFEN 800 MG PO TABS: 800.0000 mg | ORAL_TABLET | Freq: Three times a day (TID) | ORAL | 0 refills | Status: DC | PRN

## 2016-11-03 NOTE — Progress Notes (Signed)
She presents today for follow-up of a bunion and an Akin osteotomy left foot with multiple hammertoe repairs bilaterally. She states that she has not been wearing her boot at home and that she's been walking barefoot. She is also complaining about how swollen her big toe is.  Objective: Vital signs are stable she is alert and oriented 3. Sutures are intact most of the margins appear to be well coapted hallux is swollen and tender on palpation.  Assessment: Swollen left foot status post surgical intervention correction of bunion and hallux interphalangeal with hammertoe repairs bilateral.  Plan: Some the sutures were removed today. We will follow up with her in 1 week for removal of remainder of the sutures. I redressed the foot today dressed with compressive dressing radiographs need to be taken next time of the left foot.

## 2016-11-05 ENCOUNTER — Ambulatory Visit: Payer: Self-pay | Admitting: Nurse Practitioner

## 2016-11-06 ENCOUNTER — Telehealth: Payer: Self-pay | Admitting: Podiatry

## 2016-11-06 NOTE — Telephone Encounter (Signed)
Good afternoon. My toe is still swollen and kind of hurts. Can someone please call me back and let me know what I can do to help the swelling go down. You can reach me at [phone].

## 2016-11-06 NOTE — Telephone Encounter (Signed)
I asked pt if the toe was hot, with streaking red from the toe or draining and pt denied symptoms. I told pt that if the toe had none of those symptoms, then she is probably dealing with surgical swelling, to rest, ice and elevate, she may have swelling to varying degrees for 6-9 months. Pt states understanding.

## 2016-11-07 ENCOUNTER — Other Ambulatory Visit (INDEPENDENT_AMBULATORY_CARE_PROVIDER_SITE_OTHER): Payer: Medicare Other

## 2016-11-07 DIAGNOSIS — E559 Vitamin D deficiency, unspecified: Secondary | ICD-10-CM

## 2016-11-07 DIAGNOSIS — R61 Generalized hyperhidrosis: Secondary | ICD-10-CM | POA: Diagnosis not present

## 2016-11-07 DIAGNOSIS — Q828 Other specified congenital malformations of skin: Secondary | ICD-10-CM

## 2016-11-07 LAB — BASIC METABOLIC PANEL
BUN: 11 mg/dL (ref 6–23)
CO2: 31 mEq/L (ref 19–32)
Calcium: 10.1 mg/dL (ref 8.4–10.5)
Chloride: 101 mEq/L (ref 96–112)
Creatinine, Ser: 0.71 mg/dL (ref 0.40–1.20)
GFR: 96.43 mL/min (ref >60.00)
Glucose, Bld: 99 mg/dL (ref 70–99)
Potassium: 4.2 mEq/L (ref 3.5–5.1)
Sodium: 139 mEq/L (ref 135–145)

## 2016-11-07 LAB — TSH: TSH: 2.24 u[IU]/mL (ref 0.35–4.50)

## 2016-11-07 LAB — TESTOSTERONE: Testosterone: 30.67 ng/dL (ref 15.00–40.00)

## 2016-11-07 LAB — HEPATIC FUNCTION PANEL
ALT: 13 U/L (ref 0–35)
AST: 16 U/L (ref 0–37)
Albumin: 4.5 g/dL (ref 3.5–5.2)
Alkaline Phosphatase: 71 U/L (ref 39–117)
Bilirubin, Direct: 0.1 mg/dL (ref 0.0–0.3)
Total Bilirubin: 0.4 mg/dL (ref 0.2–1.2)
Total Protein: 8.4 g/dL — ABNORMAL HIGH (ref 6.0–8.3)

## 2016-11-07 LAB — HEMOGLOBIN A1C: Hgb A1c MFr Bld: 5.4 % (ref 4.6–6.5)

## 2016-11-07 LAB — FOLLICLE STIMULATING HORMONE: FSH: 19.9 m[IU]/mL

## 2016-11-08 ENCOUNTER — Encounter: Payer: Medicare Other | Admitting: Podiatry

## 2016-11-11 LAB — VITAMIN D 1,25 DIHYDROXY
Vitamin D 1, 25 (OH)2 Total: 40 pg/mL (ref 18–72)
Vitamin D2 1, 25 (OH)2: 9 pg/mL
Vitamin D3 1, 25 (OH)2: 31 pg/mL

## 2016-11-11 LAB — PROLACTIN: Prolactin: 6.6 ng/mL

## 2016-11-13 ENCOUNTER — Other Ambulatory Visit: Payer: Medicare Other

## 2016-11-15 ENCOUNTER — Telehealth: Payer: Self-pay | Admitting: Nurse Practitioner

## 2016-11-15 ENCOUNTER — Ambulatory Visit (INDEPENDENT_AMBULATORY_CARE_PROVIDER_SITE_OTHER): Payer: Medicare Other | Admitting: Podiatry

## 2016-11-15 ENCOUNTER — Ambulatory Visit (INDEPENDENT_AMBULATORY_CARE_PROVIDER_SITE_OTHER): Payer: Medicare Other

## 2016-11-15 DIAGNOSIS — M2041 Other hammer toe(s) (acquired), right foot: Secondary | ICD-10-CM | POA: Diagnosis not present

## 2016-11-15 DIAGNOSIS — M2042 Other hammer toe(s) (acquired), left foot: Secondary | ICD-10-CM

## 2016-11-15 NOTE — Telephone Encounter (Signed)
Patient states she did not no show her appointment on 11/5. That when the system called she pushed the number to cancel it. She does not want a fee. She states she had to have surgery.

## 2016-11-15 NOTE — Progress Notes (Signed)
She presents today for suture removal she is status post Akin osteotomy McBride bunion repair hammertoe repair fourth left hammertoe repair #3 #4 #5 on the right foot. She states that she's doing very well.  Objective: Vital signs are stable alert and oriented 3. Sutures are intact margins well coapted all the sutures were removed today margins remain well coapted or her toe is in good position and in good shape. Radiographs confirm that.  Assessment: Well-healing surgical feet bilateral.  Plan: Laser and another Darco shoe today will follow up with her in 2-4 weeks.

## 2016-11-16 NOTE — Telephone Encounter (Signed)
Please let patient know this has been corrected and she will not get a fee.

## 2016-11-16 NOTE — Telephone Encounter (Signed)
Patient has been informed.

## 2016-11-29 ENCOUNTER — Other Ambulatory Visit: Payer: Medicare Other

## 2016-12-03 ENCOUNTER — Telehealth: Payer: Self-pay | Admitting: Podiatry

## 2016-12-03 NOTE — Telephone Encounter (Signed)
I had surgery on one of my toes and I'm having problems. Please call me back at [phone]. Thank you.

## 2016-12-06 ENCOUNTER — Ambulatory Visit (INDEPENDENT_AMBULATORY_CARE_PROVIDER_SITE_OTHER): Payer: Self-pay | Admitting: Podiatry

## 2016-12-06 ENCOUNTER — Ambulatory Visit (INDEPENDENT_AMBULATORY_CARE_PROVIDER_SITE_OTHER): Payer: Medicare Other

## 2016-12-06 DIAGNOSIS — M7751 Other enthesopathy of right foot: Secondary | ICD-10-CM

## 2016-12-06 DIAGNOSIS — M2041 Other hammer toe(s) (acquired), right foot: Secondary | ICD-10-CM

## 2016-12-06 DIAGNOSIS — M2042 Other hammer toe(s) (acquired), left foot: Secondary | ICD-10-CM | POA: Diagnosis not present

## 2016-12-06 MED ORDER — CYCLOBENZAPRINE HCL 5 MG PO TABS: 5.0000 mg | ORAL_TABLET | Freq: Three times a day (TID) | ORAL | 1 refills | Status: DC | PRN

## 2016-12-06 NOTE — Progress Notes (Signed)
She presents today for her postop evaluation she states that she is doing very well.  She states that she has some spasms in her calf that radiated into her back.  Bilateral foot surgery was performed.  Objective: Minimal edema no erythema cellulitis drainage or odor radiographs taken today demonstrate a well healing Aiken osteotomy and digital fusions.  Right foot similarly.  No signs of infection.  Fourth toe left foot appears to be healing well.  Assessment: Well-healing surgical foot.  Plan: Prescription for cyclobenzaprine 5 mg 1 3 times a day as needed for spasms.  Will allow her to get back into her regular shoe gear and I want to follow-up with her in 4 weeks.  Another set of x-rays will be necessary at that time.

## 2016-12-27 NOTE — Progress Notes (Signed)
This encounter was created in error - please disregard.

## 2017-01-10 ENCOUNTER — Ambulatory Visit (INDEPENDENT_AMBULATORY_CARE_PROVIDER_SITE_OTHER): Payer: Medicare Other | Admitting: Podiatry

## 2017-01-10 ENCOUNTER — Ambulatory Visit (INDEPENDENT_AMBULATORY_CARE_PROVIDER_SITE_OTHER): Payer: Medicare Other

## 2017-01-10 DIAGNOSIS — M2041 Other hammer toe(s) (acquired), right foot: Secondary | ICD-10-CM | POA: Diagnosis not present

## 2017-01-10 DIAGNOSIS — M2042 Other hammer toe(s) (acquired), left foot: Secondary | ICD-10-CM

## 2017-01-10 DIAGNOSIS — M7751 Other enthesopathy of right foot: Secondary | ICD-10-CM

## 2017-01-10 NOTE — Progress Notes (Signed)
She presents today states that her left foot feels good but her right foot still demonstrates some soreness particularly in toes #3 #4 of the foot.  She previously had bunion surgery bilaterally and metatarsal osteotomies and hammertoe repairs.  Objective: Vital signs are stable alert oriented x3 all surgical sites.  Be healing very well though she has mild hypertrophic scar of the left foot.  The scar overlies the first metatarsophalangeal joint.  Radiographs today demonstrate well healing osteotomies and arthrodeses.  Assessment: Well-healing surgical foot bilateral.  Plan: We will follow-up with her in about 2 months.

## 2017-02-01 NOTE — Progress Notes (Signed)
DOS 10/19/16 Mcbride bunion repair Lt, Aiken osteotomy Lt hallux,  Hammertoe repair 3, 4, 5th Rt foot, Hammerto repair 55th Lt

## 2017-02-04 ENCOUNTER — Telehealth: Payer: Self-pay | Admitting: Podiatry

## 2017-02-04 NOTE — Telephone Encounter (Signed)
I'm having really bad sweaty feet and foot odor. I've never had this problem before and I noticed it not long after my surgery. If you could just give me a call back at [phone]. Thank you.

## 2017-02-04 NOTE — Telephone Encounter (Signed)
Not likely to be associated with surgery but could use anti-purspirant with aluminum chloride.  ie Secret or Arid extra dru.

## 2017-02-04 NOTE — Progress Notes (Signed)
[name] [place], [place] [address] Phone: [phone] Subjective:    I'm seeing this patient by the request  of:    CC: Bilateral arm pain  [id number]  Melissa [name] is a 42 y.o. female coming in with complaint of bilateral arm pain. She states that both her forearms are painful.  Patient has been seen previously and has responded well to carpal tunnel injections.  Patient has done relatively well also with the Effexor.  Feels like it has helped overall.  Now having worsening discomfort and pain again.  Seems to be associated with the carpal tunnel.  Started noticed some loss of grip strength.  Worsening pain at night.  Has not been doing the bracing regularly.       Past Medical History:  Diagnosis Date  . Anxiety   . Arthritis   . Carpal tunnel syndrome   . Depression   . Hammer toe 04/2013   bilateral 4th and 5th   . Ingrown left big toenail 04/2013  . Mental disorder   . Neuromuscular disorder ([place])   . Post term pregnancy over 40 weeks 09/21/2015  . SVD (spontaneous vaginal delivery) 09/22/2015  . UTI (lower urinary tract infection)    Past Surgical History:  Procedure Laterality Date  . BREAST SURGERY    . CORRECTION HAMMER TOE Bilateral   . CRYOABLATION  2004  . CYSTOSCOPY WITH HYDRODISTENSION AND BIOPSY  07/28/2004   with urethral dilatation  . DILATION AND CURETTAGE OF UTERUS    . DILATION AND EVACUATION  12/21/2011   Procedure: DILATATION AND EVACUATION;  Surgeon: Terrance Mass, MD;  Location: [facility];  Service: Gynecology;  Laterality: N/A;  first trimester   . HAMMER TOE SURGERY Bilateral 04/20/2013   Procedure: BILATERAL HAMMER TOE REPAIR AND EXCISION NAIL PERMANENT LEFT FIRST;  Surgeon: Harriet Masson, DPM;  Location: [facility];  Service: Podiatry;  Laterality: Bilateral;  Hammer toe repair fourth and fifth toes bilateral feet with screw placement fourth toes and partial  nail excision left great toe  . HYSTEROSALPINGOGRAM    . TOE SURGERY     ingrown toenail  . WISDOM TOOTH EXTRACTION     Social History   Socioeconomic History  . Marital status: Single    Spouse name: None  . Number of children: None  . Years of education: None  . Highest education level: None  Social Needs  . Financial resource strain: None  . Food insecurity - worry: None  . Food insecurity - inability: None  . Transportation needs - medical: None  . Transportation needs - non-medical: None  Occupational History  . None  Tobacco Use  . Smoking status: Never Smoker  . Smokeless tobacco: Never Used  Substance and Sexual Activity  . Alcohol use: No    Comment: occasionally  . Drug use: No  . Sexual activity: Yes    Birth control/protection: IUD  Other Topics Concern  . None  Social History Narrative  . None   No Known Allergies Family History  Problem Relation Age of Onset  . Cancer Father        lung cancer  . Birth defects Paternal Grandfather   . Seizures Mother   . Arthritis Mother   . Thyroid disease Maternal Grandmother   . Heart disease Paternal Grandmother        2 CABG     Past medical history, social, surgical and family history  all reviewed in electronic medical record.  No pertanent information unless stated regarding to the chief complaint.   Review of Systems:Review of systems updated and as accurate as of 02/05/17  No headache, visual changes, nausea, vomiting, diarrhea, constipation, dizziness, abdominal pain, skin rash, fevers, chills, night sweats, weight loss, swollen lymph nodes, body aches, joint swelling, chest pain, shortness of breath, mood changes.  Positive muscle aches  Objective  Blood pressure 100/70, pulse 76, height 5\' 1"  (1.549 m), weight 181 lb (82.1 kg), SpO2 98 %, unknown if currently breastfeeding. Systems examined below as of 02/05/17   General: No apparent distress alert and oriented x3 mood and affect normal, dressed  appropriately.  HEENT: Pupils equal, extraocular movements intact  Respiratory: Patient's speak in full sentences and does not appear short of breath  Cardiovascular: No lower extremity edema, non tender, no erythema  Skin: Warm dry intact with no signs of infection or rash on extremities or on axial skeleton.  Abdomen: Soft nontender  Neuro: Cranial nerves II through XII are intact, neurovascularly intact in all extremities with 2+ DTRs and 2+ pulses.  Lymph: No lymphadenopathy of posterior or anterior cervical chain or axillae bilaterally.  Gait normal with good balance and coordination.  MSK:  Non tender with full range of motion and good stability and symmetric strength and tone  of shoulders, elbows, hip, knee and ankles bilaterally.  Wrist: Bilateral Inspection normal with no visible erythema or swelling. ROM smooth and normal with good flexion and extension and ulnar/radial deviation that is symmetrical with opposite wrist. Palpation is normal over metacarpals, navicular, lunate, and TFCC; tendons without tenderness/ swelling No snuffbox tenderness. No tenderness over Canal of [place]. Strength 5/5 in all directions without pain. Negative Finkelstein, positive Tinel's and phalens. Negative Watson's test.  ] Procedure: Real-time Ultrasound Guided Injection of right carpal tunnel Device: GE Logiq Q7  Ultrasound guided injection is preferred based studies that show increased duration, increased effect, greater accuracy, decreased procedural pain, increased response rate with ultrasound guided versus blind injection.  Verbal informed consent obtained.  Time-out conducted.  Noted no overlying erythema, induration, or other signs of local infection.  Skin prepped in a sterile fashion.  Local anesthesia: Topical Ethyl chloride.  With sterile technique and under real time ultrasound guidance:  median nerve visualized.  23g 5/8 inch needle inserted distal to proximal approach into nerve  sheath. Pictures taken nfor needle placement. Patient did have injection of 2 cc of 1% lidocaine, 1 cc of 0.5% Marcaine, and 1 cc of Kenalog 40 mg/dL. Completed without difficulty  Pain immediately resolved suggesting accurate placement of the medication.  Advised to call if fevers/chills, erythema, induration, drainage, or persistent bleeding.  Images permanently stored and available for review in the ultrasound unit.  Impression: Technically successful ultrasound guided injection.  Procedure: Real-time Ultrasound Guided Injection of the left carpal tunnel Device: GE Logiq Q7 Ultrasound guided injection is preferred based studies that show increased duration, increased effect, greater accuracy, decreased procedural pain, increased response rate with ultrasound guided versus blind injection.  Verbal informed consent obtained.  Time-out conducted.  Noted no overlying erythema, induration, or other signs of local infection.  Skin prepped in a sterile fashion.  Local anesthesia: Topical Ethyl chloride.  With sterile technique and under real time ultrasound guidance:  median nerve visualized.  23g 5/8 inch needle inserted distal to proximal approach into nerve sheath. Pictures taken nfor needle placement. Patient did have injection of 2 cc of 0.5% Marcaine, and 1 cc  of Kenalog 40 mg/dL. Completed without difficulty  Pain immediately resolved suggesting accurate placement of the medication.  Advised to call if fevers/chills, erythema, induration, drainage, or persistent bleeding.  Images permanently stored and available for review in the ultrasound unit.  Impression: Technically successful ultrasound guided injection.     Impression and Recommendations:     This case required medical decision making of moderate complexity.      Note: This dictation was prepared with Dragon dictation along with smaller phrase technology. Any transcriptional errors that result from this process are  unintentional.

## 2017-02-04 NOTE — Telephone Encounter (Signed)
Left message informing pt I would inform Dr. Milinda Pointer and call again tomorrow.

## 2017-02-05 ENCOUNTER — Ambulatory Visit (INDEPENDENT_AMBULATORY_CARE_PROVIDER_SITE_OTHER): Payer: Medicare Other | Admitting: Family Medicine

## 2017-02-05 ENCOUNTER — Ambulatory Visit: Payer: Self-pay

## 2017-02-05 ENCOUNTER — Encounter: Payer: Self-pay | Admitting: Family Medicine

## 2017-02-05 VITALS — BP 100/70 | HR 76 | Ht 61.0 in | Wt 181.0 lb

## 2017-02-05 DIAGNOSIS — M79601 Pain in right arm: Secondary | ICD-10-CM

## 2017-02-05 DIAGNOSIS — G5603 Carpal tunnel syndrome, bilateral upper limbs: Secondary | ICD-10-CM | POA: Diagnosis not present

## 2017-02-05 DIAGNOSIS — F4312 Post-traumatic stress disorder, chronic: Secondary | ICD-10-CM | POA: Diagnosis not present

## 2017-02-05 DIAGNOSIS — M79602 Pain in left arm: Secondary | ICD-10-CM | POA: Diagnosis not present

## 2017-02-05 DIAGNOSIS — M79609 Pain in unspecified limb: Secondary | ICD-10-CM | POA: Diagnosis not present

## 2017-02-05 MED ORDER — VENLAFAXINE HCL ER 150 MG PO CP24: 150.0000 mg | ORAL_CAPSULE | Freq: Every day | ORAL | 3 refills | Status: DC

## 2017-02-05 NOTE — Assessment & Plan Note (Signed)
Bilateral injections given today.  Tolerated the procedure well.  Patient wants to avoid any type of surgical intervention.  We did.  We discussed which activities to do which wants to avoid.  Bracing again at night.  Topical anti-inflammatories.  Follow-up again in 4 weeks

## 2017-02-05 NOTE — Telephone Encounter (Signed)
I informed pt Dr. Stephenie Acres recommendations.

## 2017-02-05 NOTE — Patient Instructions (Signed)
Good to see you  Melissa [name] is your friend.  Injected both carpal tunnel again. I hope it helps New exercises for piriformis.  Tennis ball in back pocket when sitting long amount of time.  See me again in 6 weeks if not perfect

## 2017-02-08 ENCOUNTER — Ambulatory Visit: Payer: Self-pay | Admitting: Nurse Practitioner

## 2017-02-08 DIAGNOSIS — Z0289 Encounter for other administrative examinations: Secondary | ICD-10-CM

## 2017-02-13 ENCOUNTER — Ambulatory Visit: Payer: Self-pay

## 2017-02-13 NOTE — Telephone Encounter (Addendum)
Pt called to complain that she is continuing to have lower back and buttock pain. Pt stated it was mainly buttock pain that she has had over a year. Pt was seen in office and had bilateral wrists injected and Dr Tamala Julian wanted to see her in 4-6 weeks.  Pt very curt on the phone and did not want to answer triage questions. Pt stated that she wanted to have appt with Hulan Saas MD only. Pt put on wait list. Called PCP office and discussed with Sam. Unable to work pt in but Dr Paulla Fore at [facility] had openings. Pt called and offered appt today and pt states that she only wants to see Dr Tamala Julian and will wait for appt. Appt made for 02/25/17 at 0830 and is on wait list.  Reason for Disposition . [1] SEVERE back pain (e.g., excruciating, unable to do any normal activities) AND [2] not improved 2 hours after pain medicine    Pain 10/10 and only wanting to see Dr. Tamala Julian . Back pain is a chronic symptom (recurrent or ongoing AND present > 4 weeks)  Additional Information . Commented on: [1] SEVERE abdominal pain AND [2] present > 1 hour    Pt wanting OV only with Dr Tamala Julian  Answer Assessment - Initial Assessment Questions 1. ONSET: "When did the pain begin?"      Over a year 2. LOCATION: "Where does it hurt?" (upper, mid or lower back)     buttock 3. SEVERITY: "How bad is the pain?"  (e.g., Scale 1-10; mild, moderate, or severe)   - MILD (1-3): doesn't interfere with normal activities    - MODERATE (4-7): interferes with normal activities or awakens from sleep    - SEVERE (8-10): excruciating pain, unable to do any normal activities      10 4. PATTERN: "Is the pain constant?" (e.g., yes, no; constant, intermittent)      Constant last 2 days 5. RADIATION: "Does the pain shoot into your legs or elsewhere?"     Pain in buttocks and lower back pain  6. CAUSE:  "What do you think is causing the back pain?"      No  7. BACK OVERUSE:  "Any recent lifting of heavy objects, strenuous work or exercise?"    no 8. MEDICATIONS: "What have you taken so far for the pain?" (e.g., nothing, acetaminophen, NSAIDS)     Ibuprofen and rx meds 9. NEUROLOGIC SYMPTOMS: "Do you have any weakness, numbness, or problems with bowel/bladder control?" Did not ask 10. OTHER SYMPTOMS: "Do you have any other symptoms?" (e.g., fever, abdominal pain, burning with urination, blood in urine) Wrist pain 11. PREGNANCY: "Is there any chance you are pregnant?" (e.g., yes, no; LMP)       Did not ask-  Protocols used: BACK PAIN-A-AH

## 2017-02-23 NOTE — Progress Notes (Signed)
[name] [address] [place], [place] [address] Phone: [phone] Subjective:    I'm seeing this patient by the request  of:    CC: Severe back pain.  [id number]  Melissa [name] is a 42 y.o. female coming in with complaint of back and buttocks pain and right arm pain in the bicep. She is having numbness and weak in the right arm.  States that it is intermittent.  Patient does have carpal tunnel syndrome.  Feels that the carpal tunnel is better after the injection but still having pain that seems to be radiating up the arm not down the arm.  Denies neck pain associated with it  The left glute has been chronic. She states that the pain is 10/10. She has not been able to sleep well due to her pain.  Patient has been seen for more of a piriformis previously.  Has responded to injections.  Has been quite some time since we have seen her.  States that it can radiate down the leg.   Lumbar back x-rays from 2016 independently visualized by me.  Showed patient had no significant bony abnormality  Past Medical History:  Diagnosis Date  . Anxiety   . Arthritis   . Carpal tunnel syndrome   . Depression   . Hammer toe 04/2013   bilateral 4th and 5th   . Ingrown left big toenail 04/2013  . Mental disorder   . Neuromuscular disorder ([place])   . Post term pregnancy over 40 weeks 09/21/2015  . SVD (spontaneous vaginal delivery) 09/22/2015  . UTI (lower urinary tract infection)    Past Surgical History:  Procedure Laterality Date  . BREAST SURGERY    . CORRECTION HAMMER TOE Bilateral   . CRYOABLATION  2004  . CYSTOSCOPY WITH HYDRODISTENSION AND BIOPSY  07/28/2004   with urethral dilatation  . DILATION AND CURETTAGE OF UTERUS    . DILATION AND EVACUATION  12/21/2011   Procedure: DILATATION AND EVACUATION;  Surgeon: Terrance Mass, MD;  Location: [facility];  Service: Gynecology;  Laterality: N/A;  first trimester   . HAMMER TOE SURGERY  Bilateral 04/20/2013   Procedure: BILATERAL HAMMER TOE REPAIR AND EXCISION NAIL PERMANENT LEFT FIRST;  Surgeon: Harriet Masson, DPM;  Location: [facility];  Service: Podiatry;  Laterality: Bilateral;  Hammer toe repair fourth and fifth toes bilateral feet with screw placement fourth toes and partial nail excision left great toe  . HYSTEROSALPINGOGRAM    . TOE SURGERY     ingrown toenail  . WISDOM TOOTH EXTRACTION     Social History   Socioeconomic History  . Marital status: Single    Spouse name: Not on file  . Number of children: Not on file  . Years of education: Not on file  . Highest education level: Not on file  Social Needs  . Financial resource strain: Not on file  . Food insecurity - worry: Not on file  . Food insecurity - inability: Not on file  . Transportation needs - medical: Not on file  . Transportation needs - non-medical: Not on file  Occupational History  . Not on file  Tobacco Use  . Smoking status: Never Smoker  . Smokeless tobacco: Never Used  Substance and Sexual Activity  . Alcohol use: No    Comment: occasionally  . Drug use: No  . Sexual activity: Yes    Birth control/protection: IUD  Other Topics Concern  . Not  on file  Social History Narrative  . Not on file   No Known Allergies Family History  Problem Relation Age of Onset  . Cancer Father        lung cancer  . Birth defects Paternal Grandfather   . Seizures Mother   . Arthritis Mother   . Thyroid disease Maternal Grandmother   . Heart disease Paternal Grandmother        2 CABG     Past medical history, social, surgical and family history all reviewed in electronic medical record.  No pertanent information unless stated regarding to the chief complaint.   Review of Systems:Review of systems updated and as accurate as of 02/25/17  No headache, visual changes, nausea, vomiting, diarrhea, constipation, dizziness, abdominal pain, skin rash, fevers, chills, night sweats, weight  loss, swollen lymph nodes, joint swelling,chest pain, shortness of breath, mood changes.  Positive muscle aches, body aches  Objective  Blood pressure 118/88, pulse 74, height 5\' 1"  (1.549 m), weight 173 lb (78.5 kg), SpO2 99 %, unknown if currently breastfeeding. Systems examined below as of 02/25/17   General: No apparent distress alert and oriented x3 mood and affect normal, dressed appropriately.  HEENT: Pupils equal, extraocular movements intact  Respiratory: Patient's speak in full sentences and does not appear short of breath  Cardiovascular: No lower extremity edema, non tender, no erythema  Skin: Warm dry intact with no signs of infection or rash on extremities or on axial skeleton.  Abdomen: Soft nontender  Neuro: Cranial nerves II through XII are intact, neurovascularly intact in all extremities with 2+ DTRs and 2+ pulses.  Lymph: No lymphadenopathy of posterior or anterior cervical chain or axillae bilaterally.  Gait normal with good balance and coordination.  MSK:  Non tender with full range of motion and good stability and symmetric strength and tone of shoulders, elbows, wrist, hip, knee and ankles bilaterally.  Left wrist exam still shows mild positive Tinel's as well as the right side. Back exam shows positive Corky Sox test with severe pain in the left piriformis muscle.  Negative straight leg test.  Mild discomfort over the paraspinal musculature of the lumbar spine.  Procedure: Real-time Ultrasound Guided Injection of piriformis tendon sheath left side Device: GE Logiq Q7 Ultrasound guided injection is preferred based studies that show increased duration, increased effect, greater accuracy, decreased procedural pain, increased response rate, and decreased cost with ultrasound guided versus blind injection.  Verbal informed consent obtained.  Time-out conducted.  Noted no overlying erythema, induration, or other signs of local infection.  Skin prepped in a sterile fashion.    Local anesthesia: Topical Ethyl chloride.  With sterile technique and under real time ultrasound guidance: With a 22-gauge 3 inch needle patient was injected with a total of 1 cc of 0.5% Marcaine and 1 cc of Kenalog 40 mg/dL in the piriformis tendon sheath Completed without difficulty  Pain immediately resolved suggesting accurate placement of the medication.  Advised to call if fevers/chills, erythema, induration, drainage, or persistent bleeding.  Images permanently stored and available for review in the ultrasound unit.  Impression: Technically successful ultrasound guided injection.    Impression and Recommendations:     This case required medical decision making of moderate complexity.      Note: This dictation was prepared with Dragon dictation along with smaller phrase technology. Any transcriptional errors that result from this process are unintentional.

## 2017-02-25 ENCOUNTER — Ambulatory Visit: Payer: Self-pay

## 2017-02-25 ENCOUNTER — Ambulatory Visit (INDEPENDENT_AMBULATORY_CARE_PROVIDER_SITE_OTHER): Payer: Medicare Other | Admitting: Family Medicine

## 2017-02-25 VITALS — BP 118/88 | HR 74 | Ht 61.0 in | Wt 173.0 lb

## 2017-02-25 DIAGNOSIS — G5702 Lesion of sciatic nerve, left lower limb: Secondary | ICD-10-CM | POA: Insufficient documentation

## 2017-02-25 DIAGNOSIS — M79621 Pain in right upper arm: Secondary | ICD-10-CM | POA: Diagnosis not present

## 2017-02-25 DIAGNOSIS — M25551 Pain in right hip: Secondary | ICD-10-CM

## 2017-02-25 MED ORDER — PREDNISONE 50 MG PO TABS: 50.0000 mg | ORAL_TABLET | Freq: Every day | ORAL | 0 refills | Status: DC

## 2017-02-25 NOTE — Assessment & Plan Note (Signed)
Injected today.  Discussed icing regimen and home exercises, discussed which activity to doing which wants to avoid.  Patient is to increase activity slowly over the course the next several days.  Patient is to do more of an icing regimen.  Follow-up again in 4-6 weeks otherwise differential includes a lumbar radiculopathy and will need to consider further evaluation and workup

## 2017-02-25 NOTE — Patient Instructions (Signed)
I am sorry you are hurting  Tried an injection again in the piriformis.  Ice is your friend. Ice 20 minutes 2 times daily. Usually after activity and before bed. We will get EMG of the arm to see where the nerve is concerning Prednisone daily for 5 days  See me again in 3-4 weeks

## 2017-02-28 ENCOUNTER — Encounter: Payer: Self-pay | Admitting: Neurology

## 2017-03-05 ENCOUNTER — Other Ambulatory Visit: Payer: Self-pay | Admitting: Gynecology

## 2017-03-05 ENCOUNTER — Other Ambulatory Visit: Payer: Self-pay | Admitting: Obstetrics and Gynecology

## 2017-03-05 DIAGNOSIS — N644 Mastodynia: Secondary | ICD-10-CM

## 2017-03-12 ENCOUNTER — Telehealth: Payer: Self-pay | Admitting: *Deleted

## 2017-03-12 NOTE — Telephone Encounter (Signed)
Copied from [place] [id number]. Topic: General - Other >> Mar 12, 2017 10:33 AM Yvette Rack wrote: Reason for CRM: patient calling wanting Dr Tamala Julian assistant to give her a call she has questions about her neck and arm pain

## 2017-03-12 NOTE — Telephone Encounter (Signed)
Can't test for fibromyalgia, it is a diagnosis of exclusion.  Definitely not disabled in my medical opinion.  Can repeat laboratory workup for autoimmune disease if she would like. We did it a year ago.

## 2017-03-12 NOTE — Telephone Encounter (Signed)
Spoke to pt, she would like to have labs drawn to test for fibromyalgia. Pt states she is in a lot of pain & unable to have an EMG done until 06/14/17.

## 2017-03-13 NOTE — Telephone Encounter (Signed)
Spoke to pt, she would like to repeat the lab workuo from a year ago.

## 2017-03-14 ENCOUNTER — Ambulatory Visit (INDEPENDENT_AMBULATORY_CARE_PROVIDER_SITE_OTHER): Payer: Medicare Other

## 2017-03-14 ENCOUNTER — Encounter: Payer: Self-pay | Admitting: Podiatry

## 2017-03-14 ENCOUNTER — Ambulatory Visit (INDEPENDENT_AMBULATORY_CARE_PROVIDER_SITE_OTHER): Payer: Medicare Other | Admitting: Podiatry

## 2017-03-14 DIAGNOSIS — M2041 Other hammer toe(s) (acquired), right foot: Secondary | ICD-10-CM | POA: Diagnosis not present

## 2017-03-14 DIAGNOSIS — M2012 Hallux valgus (acquired), left foot: Secondary | ICD-10-CM

## 2017-03-14 DIAGNOSIS — M2042 Other hammer toe(s) (acquired), left foot: Secondary | ICD-10-CM

## 2017-03-14 MED ORDER — GABAPENTIN 100 MG PO CAPS: 100.0000 mg | ORAL_CAPSULE | Freq: Every day | ORAL | 3 refills | Status: DC

## 2017-03-16 NOTE — Progress Notes (Signed)
She presents today date of surgery October 19, 2016 status post [facility] osteotomy [place] bunionectomy permanent nail procedures as well as hammertoe repairs 3 through 5 right and 4 left she states that the scar looks bad of the left foot but everything else seems to be doing well.  Objective: Vital signs are stable alert and oriented x3.  Pulses are palpable.  Hypertrophic scar overlying the incision site first metatarsophalangeal joint left foot but mildly so.  This should go on to heal within the years time.  She has great range of motion of all of the toes radiographs do not demonstrate any type of osseous abnormalities everything appears to be healing normally.  Assessment: Well-healing surgical foot bilateral.  Plan: Follow-up with me in October for evaluation of the scar if necessary for reevaluation and reexcision.  However if she has any problems with any of the surgery she is to notify us immediately.

## 2017-03-18 ENCOUNTER — Other Ambulatory Visit: Payer: Self-pay

## 2017-03-19 ENCOUNTER — Encounter: Payer: Self-pay | Admitting: Family Medicine

## 2017-03-19 ENCOUNTER — Ambulatory Visit (INDEPENDENT_AMBULATORY_CARE_PROVIDER_SITE_OTHER): Payer: Medicare Other | Admitting: Family Medicine

## 2017-03-19 ENCOUNTER — Ambulatory Visit (INDEPENDENT_AMBULATORY_CARE_PROVIDER_SITE_OTHER)
Admission: RE | Admit: 2017-03-19 | Discharge: 2017-03-19 | Disposition: A | Discharge status: Home / Self Care | Payer: Medicare Other | Source: Ambulatory Visit | Attending: Family Medicine | Admitting: Family Medicine

## 2017-03-19 ENCOUNTER — Other Ambulatory Visit (INDEPENDENT_AMBULATORY_CARE_PROVIDER_SITE_OTHER): Payer: Medicare Other

## 2017-03-19 VITALS — BP 120/82 | HR 96 | Ht 61.0 in | Wt 168.0 lb

## 2017-03-19 DIAGNOSIS — M25552 Pain in left hip: Secondary | ICD-10-CM

## 2017-03-19 DIAGNOSIS — M2550 Pain in unspecified joint: Secondary | ICD-10-CM | POA: Diagnosis not present

## 2017-03-19 DIAGNOSIS — M542 Cervicalgia: Secondary | ICD-10-CM | POA: Diagnosis not present

## 2017-03-19 LAB — COMPREHENSIVE METABOLIC PANEL
ALT: 14 U/L (ref 0–35)
AST: 15 U/L (ref 0–37)
Albumin: 4.6 g/dL (ref 3.5–5.2)
Alkaline Phosphatase: 61 U/L (ref 39–117)
BUN: 9 mg/dL (ref 6–23)
CO2: 29 mEq/L (ref 19–32)
Calcium: 9.7 mg/dL (ref 8.4–10.5)
Chloride: 99 mEq/L (ref 96–112)
Creatinine, Ser: 0.68 mg/dL (ref 0.40–1.20)
GFR: 101.18 mL/min (ref >60.00)
Glucose, Bld: 100 mg/dL — ABNORMAL HIGH (ref 70–99)
Potassium: 3.4 mEq/L — ABNORMAL LOW (ref 3.5–5.1)
Sodium: 137 mEq/L (ref 135–145)
Total Bilirubin: 1 mg/dL (ref 0.2–1.2)
Total Protein: 8.1 g/dL (ref 6.0–8.3)

## 2017-03-19 LAB — CBC WITH DIFFERENTIAL/PLATELET
Basophils Absolute: 0.1 10*3/uL (ref 0.0–0.1)
Basophils Relative: 1.5 % (ref 0.0–3.0)
Eosinophils Absolute: 0.1 10*3/uL (ref 0.0–0.7)
Eosinophils Relative: 1 % (ref 0.0–5.0)
HCT: 42.4 % (ref 36.0–46.0)
Hemoglobin: 14.8 g/dL (ref 12.0–15.0)
Lymphocytes Relative: 25.9 % (ref 12.0–46.0)
Lymphs Abs: 1.9 10*3/uL (ref 0.7–4.0)
MCHC: 35 g/dL (ref 30.0–36.0)
MCV: 90.2 fl (ref 78.0–100.0)
Monocytes Absolute: 0.4 10*3/uL (ref 0.1–1.0)
Monocytes Relative: 6 % (ref 3.0–12.0)
Neutro Abs: 4.9 10*3/uL (ref 1.4–7.7)
Neutrophils Relative %: 65.6 % (ref 43.0–77.0)
Platelets: 287 10*3/uL (ref 150.0–400.0)
RBC: 4.7 Mil/uL (ref 3.87–5.11)
RDW: 12.9 % (ref 11.5–15.5)
WBC: 7.4 10*3/uL (ref 4.0–10.5)

## 2017-03-19 LAB — IBC PANEL
Iron: 207 ug/dL — ABNORMAL HIGH (ref 42–145)
Saturation Ratios: 52.1 % — ABNORMAL HIGH (ref 20.0–50.0)
Transferrin: 284 mg/dL (ref 212.0–360.0)

## 2017-03-19 LAB — VITAMIN D 25 HYDROXY (VIT D DEFICIENCY, FRACTURES): VITD: 17.78 ng/mL — ABNORMAL LOW (ref 30.00–100.00)

## 2017-03-19 LAB — SEDIMENTATION RATE: Sed Rate: 45 mm/hr — ABNORMAL HIGH (ref 0–20)

## 2017-03-19 LAB — C-REACTIVE PROTEIN: CRP: 0.1 mg/dL — ABNORMAL LOW (ref 0.5–20.0)

## 2017-03-19 LAB — TSH: TSH: 0.93 u[IU]/mL (ref 0.35–4.50)

## 2017-03-19 LAB — URIC ACID: Uric Acid, Serum: 3.3 mg/dL (ref 2.4–7.0)

## 2017-03-19 NOTE — Progress Notes (Signed)
[name] [address] [place], [place] [address] Phone: [phone] Subjective:    CC: Neck and arm pain follow-up  GYI:[id number]  Melissa [name] is a 42 y.o. female coming in with complaint of neck and arm pain. She has not gotten any better since last visit. She would like a prescription for gabapentin.  Patient continues to have pain.  Severe overall.  Patient states that this is not getting any better.  Feels that there is something wrong.  Patient had laboratory workup greater than a year ago, iron deficiency and vitamin D.  Patient has been doing this.  The increase in Effexor has not made any improvement either.  Injection of the piriformis may no improvement.       Past Medical History:  Diagnosis Date  . Anxiety   . Arthritis   . Carpal tunnel syndrome   . Depression   . Hammer toe 04/2013   bilateral 4th and 5th   . Ingrown left big toenail 04/2013  . Mental disorder   . Neuromuscular disorder ([place])   . Post term pregnancy over 40 weeks 09/21/2015  . SVD (spontaneous vaginal delivery) 09/22/2015  . UTI (lower urinary tract infection)    Past Surgical History:  Procedure Laterality Date  . BREAST SURGERY    . CORRECTION HAMMER TOE Bilateral   . CRYOABLATION  2004  . CYSTOSCOPY WITH HYDRODISTENSION AND BIOPSY  07/28/2004   with urethral dilatation  . DILATION AND CURETTAGE OF UTERUS    . DILATION AND EVACUATION  12/21/2011   Procedure: DILATATION AND EVACUATION;  Surgeon: Terrance Mass, MD;  Location: [facility];  Service: Gynecology;  Laterality: N/A;  first trimester   . HAMMER TOE SURGERY Bilateral 04/20/2013   Procedure: BILATERAL HAMMER TOE REPAIR AND EXCISION NAIL PERMANENT LEFT FIRST;  Surgeon: Harriet Masson, DPM;  Location: [facility];  Service: Podiatry;  Laterality: Bilateral;  Hammer toe repair fourth and fifth toes bilateral feet with screw placement fourth toes and partial nail excision  left great toe  . HYSTEROSALPINGOGRAM    . TOE SURGERY     ingrown toenail  . WISDOM TOOTH EXTRACTION     Social History   Socioeconomic History  . Marital status: Single    Spouse name: None  . Number of children: None  . Years of education: None  . Highest education level: None  Social Needs  . Financial resource strain: None  . Food insecurity - worry: None  . Food insecurity - inability: None  . Transportation needs - medical: None  . Transportation needs - non-medical: None  Occupational History  . None  Tobacco Use  . Smoking status: Never Smoker  . Smokeless tobacco: Never Used  Substance and Sexual Activity  . Alcohol use: No    Comment: occasionally  . Drug use: No  . Sexual activity: Yes    Birth control/protection: IUD  Other Topics Concern  . None  Social History Narrative  . None   No Known Allergies Family History  Problem Relation Age of Onset  . Cancer Father        lung cancer  . Birth defects Paternal Grandfather   . Seizures Mother   . Arthritis Mother   . Thyroid disease Maternal Grandmother   . Heart disease Paternal Grandmother        2 CABG     Past medical history, social, surgical and family history all reviewed in electronic  medical record.  No pertanent information unless stated regarding to the chief complaint.   Review of Systems:Review of systems updated and as accurate as of 03/19/17  No headache, visual changes, nausea, vomiting, diarrhea, constipation, dizziness, abdominal pain, skin rash, fevers, chills, night sweats, weight loss, swollen lymph nodes,  chest pain, shortness of breath, mood changes.  Positive muscle aches, body aches, joint swelling, intermittent skin rash  Objective  Blood pressure 120/82, pulse 96, height 5\' 1"  (1.549 m), weight 168 lb (76.2 kg), SpO2 99 %, unknown if currently breastfeeding. Systems examined below as of 03/19/17   General: No apparent distress alert and oriented x3 mood and affect normal,  dressed appropriately.  HEENT: Pupils equal, extraocular movements intact  Respiratory: Patient's speak in full sentences and does not appear short of breath  Cardiovascular: Trace lower extremity edema, non tender, no erythema  Skin: Warm dry intact with no signs of infection or rash on extremities or on axial skeleton.  Abdomen: Soft nontender  Neuro: Cranial nerves II through XII are intact, neurovascularly intact in all extremities with 2+ DTRs and 2+ pulses.  Lymph: No lymphadenopathy of posterior or anterior cervical chain or axillae bilaterally.  Gait normal with good balance and coordination.  MSK: Diffuse tender with full range of motion and good stability and symmetric strength and tone of shoulders, elbows, wrist, hip, knee and ankles bilaterally.  Pain does seem to be somewhat out of proportion.  Does seem to be symmetrical.     Impression and Recommendations:     This case required medical decision making of moderate complexity.      Note: This dictation was prepared with Dragon dictation along with smaller phrase technology. Any transcriptional errors that result from this process are unintentional.

## 2017-03-19 NOTE — Assessment & Plan Note (Signed)
Spent  25 minutes with patient face-to-face and had greater than 50% of counseling including as described above in assessment and plan.  Worsening polyarthralgia.  Not responding to different medications at this time.  Patient has had a laboratory workup previously but greater than a year ago.  Will rule out other signs that could be contributing such as iron deficiency, vitamin D deficiency, autoimmune diseases and inflammatory markers.  We will see if anything comes back positive.  Discussed with patient with her history of bipolar if she felt like there was any depression or suicidal homicidal ideation which patient declined.  We will continue to monitor.  Follow-up again in 2 weeks

## 2017-03-19 NOTE — Patient Instructions (Signed)
Good to see you  I am sorry I do not have a good direction at the moment.  Lets get labs and xrays and see what it shows.

## 2017-03-21 LAB — RHEUMATOID FACTOR: Rhuematoid fact SerPl-aCnc: <14 IU/mL (ref <14)

## 2017-03-21 LAB — CALCIUM, IONIZED: Calcium, Ion: 4.9 mg/dL (ref 4.8–5.6)

## 2017-03-21 LAB — ANA: Anti Nuclear Antibody(ANA): NEGATIVE

## 2017-03-21 LAB — PTH, INTACT AND CALCIUM
Calcium: 9.2 mg/dL (ref 8.6–10.2)
PTH: 72 pg/mL — ABNORMAL HIGH (ref 14–64)

## 2017-03-21 LAB — CYCLIC CITRUL PEPTIDE ANTIBODY, IGG: Cyclic Citrullin Peptide Ab: <16 UNITS

## 2017-03-21 LAB — ANGIOTENSIN CONVERTING ENZYME: Angiotensin-Converting Enzyme: 17 U/L (ref 9–67)

## 2017-03-28 ENCOUNTER — Ambulatory Visit
Admission: RE | Admit: 2017-03-28 | Discharge: 2017-03-28 | Disposition: A | Discharge status: Home / Self Care | Payer: Medicare Other | Source: Ambulatory Visit | Attending: Obstetrics and Gynecology | Admitting: Obstetrics and Gynecology

## 2017-03-28 ENCOUNTER — Other Ambulatory Visit: Payer: Self-pay | Admitting: Obstetrics and Gynecology

## 2017-03-28 ENCOUNTER — Ambulatory Visit: Payer: Self-pay

## 2017-03-28 DIAGNOSIS — N644 Mastodynia: Secondary | ICD-10-CM

## 2017-03-30 ENCOUNTER — Ambulatory Visit: Payer: Self-pay | Admitting: Family Medicine

## 2017-04-13 NOTE — Progress Notes (Signed)
[name] [address] [place], [place] [address] Phone: [phone] Subjective:     CC: Right elbow pain  [id number]  Melissa [name] is a 42 y.o. female coming in with complaint of right elbow pain. The pain radiates from the elbow to the wrist. Pain is constant and achy. She hasn't been using her arms more than usual.  Patient has pain more over the lateral epicondylar region is where she points.  Since it is severe and feels like she has had no weakness.  Patient was worked up for polyarthralgia and was found to have an elevated sedimentation rate.  Patient though does not have any other significant labs.  Autoimmune labs were otherwise unremarkable.  No signs of any systemic illnesses.     Past Medical History:  Diagnosis Date  . Anxiety   . Arthritis   . Carpal tunnel syndrome   . Depression   . Hammer toe 04/2013   bilateral 4th and 5th   . Ingrown left big toenail 04/2013  . Mental disorder   . Neuromuscular disorder ([place])   . Post term pregnancy over 40 weeks 09/21/2015  . SVD (spontaneous vaginal delivery) 09/22/2015  . UTI (lower urinary tract infection)    Past Surgical History:  Procedure Laterality Date  . BREAST SURGERY    . CORRECTION HAMMER TOE Bilateral   . CRYOABLATION  2004  . CYSTOSCOPY WITH HYDRODISTENSION AND BIOPSY  07/28/2004   with urethral dilatation  . DILATION AND CURETTAGE OF UTERUS    . DILATION AND EVACUATION  12/21/2011   Procedure: DILATATION AND EVACUATION;  Surgeon: Terrance Mass, MD;  Location: [facility];  Service: Gynecology;  Laterality: N/A;  first trimester   . HAMMER TOE SURGERY Bilateral 04/20/2013   Procedure: BILATERAL HAMMER TOE REPAIR AND EXCISION NAIL PERMANENT LEFT FIRST;  Surgeon: Harriet Masson, DPM;  Location: [facility];  Service: Podiatry;  Laterality: Bilateral;  Hammer toe repair fourth and fifth toes bilateral feet with screw placement fourth toes and  partial nail excision left great toe  . HYSTEROSALPINGOGRAM    . TOE SURGERY     ingrown toenail  . WISDOM TOOTH EXTRACTION     Social History   Socioeconomic History  . Marital status: Single    Spouse name: Not on file  . Number of children: Not on file  . Years of education: Not on file  . Highest education level: Not on file  Occupational History  . Not on file  Social Needs  . Financial resource strain: Not on file  . Food insecurity:    Worry: Not on file    Inability: Not on file  . Transportation needs:    Medical: Not on file    Non-medical: Not on file  Tobacco Use  . Smoking status: Never Smoker  . Smokeless tobacco: Never Used  Substance and Sexual Activity  . Alcohol use: No    Comment: occasionally  . Drug use: No  . Sexual activity: Yes    Birth control/protection: IUD  Lifestyle  . Physical activity:    Days per week: Not on file    Minutes per session: Not on file  . Stress: Not on file  Relationships  . Social connections:    Talks on phone: Not on file    Gets together: Not on file    Attends religious service: Not on file    Active member of club or organization: Not  on file    Attends meetings of clubs or organizations: Not on file    Relationship status: Not on file  Other Topics Concern  . Not on file  Social History Narrative  . Not on file   No Known Allergies Family History  Problem Relation Age of Onset  . Cancer Father        lung cancer  . Birth defects Paternal Grandfather   . Seizures Mother   . Arthritis Mother   . Thyroid disease Maternal Grandmother   . Heart disease Paternal Grandmother        2 CABG     Past medical history, social, surgical and family history all reviewed in electronic medical record.  No pertanent information unless stated regarding to the chief complaint.   Review of Systems:Review of systems updated and as accurate as of 04/15/17  No headache, visual changes, nausea, vomiting, diarrhea,  constipation, dizziness, abdominal pain, skin rash, fevers, chills, night sweats, weight loss, swollen lymph nodes, ,  chest pain, shortness of breath, mood changes.  Positive muscle aches, joint swelling, body aches  Objective  Height 5\' 1"  (1.549 m), weight 168 lb (76.2 kg), unknown if currently breastfeeding. Systems examined below as of 04/15/17   General: No apparent distress alert and oriented x3 mood and affect normal, dressed appropriately.  HEENT: Pupils equal, extraocular movements intact  Respiratory: Patient's speak in full sentences and does not appear short of breath  Cardiovascular: No lower extremity edema, non tender, no erythema  Skin: Warm dry intact with no signs of infection or rash on extremities or on axial skeleton.  Abdomen: Soft nontender  Neuro: Cranial nerves II through XII are intact, neurovascularly intact in all extremities with 2+ DTRs and 2+ pulses.  Lymph: No lymphadenopathy of posterior or anterior cervical chain or axillae bilaterally.  Gait normal with good balance and coordination.  MSK: Diffuse tender with full range of motion and good stability and symmetric strength and tone of shoulders, , wrist, hip, knee and ankles bilaterally.  Right elbow exam shows no gross deformity.  Near full range of motion.  Patient does have severe tenderness to palpation over the lateral epicondylar region and worsening pain with resisted extension of the wrist.  After verbal consent patient was prepped with alcohol swabs and with a 25-gauge half inch needle injected with 0.5 cc of 0.5% Marcaine and 0.5 cc of Kenalog 40 mg/mL into the right lateral epicondylar region.  No blood loss.  Band-Aid placed.  Postinjection instructions given    Impression and Recommendations:     This case required medical decision making of moderate complexity.      Note: This dictation was prepared with Dragon dictation along with smaller phrase technology. Any transcriptional errors that  result from this process are unintentional.

## 2017-04-15 ENCOUNTER — Encounter: Payer: Self-pay | Admitting: Family

## 2017-04-15 ENCOUNTER — Encounter: Payer: Self-pay | Admitting: Family Medicine

## 2017-04-15 ENCOUNTER — Ambulatory Visit (INDEPENDENT_AMBULATORY_CARE_PROVIDER_SITE_OTHER): Payer: Medicare Other | Admitting: Family Medicine

## 2017-04-15 ENCOUNTER — Ambulatory Visit (INDEPENDENT_AMBULATORY_CARE_PROVIDER_SITE_OTHER): Payer: Medicare Other | Admitting: Family

## 2017-04-15 VITALS — BP 112/80 | HR 82 | Temp 97.8°F | Ht 61.0 in | Wt 170.1 lb

## 2017-04-15 DIAGNOSIS — M7711 Lateral epicondylitis, right elbow: Secondary | ICD-10-CM | POA: Diagnosis not present

## 2017-04-15 DIAGNOSIS — H9193 Unspecified hearing loss, bilateral: Secondary | ICD-10-CM

## 2017-04-15 MED ORDER — FLUTICASONE PROPIONATE 50 MCG/ACT NA SUSP: 2.0000 | Freq: Every day | NASAL | 6 refills | Status: DC

## 2017-04-15 MED ORDER — SELENIUM SULFIDE 2.5 % EX LOTN: 1.0000 "application " | TOPICAL_LOTION | Freq: Every day | CUTANEOUS | 0 refills | Status: DC | PRN

## 2017-04-15 NOTE — Patient Instructions (Signed)
Good to see you  We will try an injection today  Ice is your friend  Try avoid extension of the wrist  See me again in 4-6 weeks

## 2017-04-15 NOTE — Assessment & Plan Note (Signed)
Given injection.  Tolerated the procedure well.  Home exercises given.  Discussed avoiding wrist extension.  Discussed proper lifting mechanics and home exercises.  Could have a radial nerve irritation.  Follow-up again in 4 weeks

## 2017-04-15 NOTE — Patient Instructions (Signed)
Eustachian Tube Dysfunction The eustachian tube connects the middle ear to the back of the nose. It regulates air pressure in the middle ear by allowing air to move between the ear and nose. It also helps to drain fluid from the middle ear space. When the eustachian tube does not function properly, air pressure, fluid, or both can build up in the middle ear. Eustachian tube dysfunction can affect one or both ears. What are the causes? This condition happens when the eustachian tube becomes blocked or cannot open normally. This may result from:  Ear infections.  Colds and other upper respiratory infections.  Allergies.  Irritation, such as from cigarette smoke or acid from the stomach coming up into the esophagus (gastroesophageal reflux).  Sudden changes in air pressure, such as from descending in an airplane.  Abnormal growths in the nose or throat, such as nasal polyps, tumors, or enlarged tissue at the back of the throat (adenoids).  What increases the risk? This condition may be more likely to develop in people who smoke and people who are overweight. Eustachian tube dysfunction may also be more likely to develop in children, especially children who have:  Certain birth defects of the mouth, such as cleft palate.  Large tonsils and adenoids.  What are the signs or symptoms? Symptoms of this condition may include:  A feeling of fullness in the ear.  Ear pain.  Clicking or popping noises in the ear.  Ringing in the ear.  Hearing loss.  Loss of balance.  Symptoms may get worse when the air pressure around you changes, such as when you travel to an area of high elevation or fly on an airplane. How is this diagnosed? This condition may be diagnosed based on:  Your symptoms.  A physical exam of your ear, nose, and throat.  Tests, such as those that measure: ? The movement of your eardrum (tympanogram). ? Your hearing (audiometry).  How is this treated? Treatment  depends on the cause and severity of your condition. If your symptoms are mild, you may be able to relieve your symptoms by moving air into ("popping") your ears. If you have symptoms of fluid in your ears, treatment may include:  Decongestants.  Antihistamines.  Nasal sprays or ear drops that contain medicines that reduce swelling (steroids).  In some cases, you may need to have a procedure to drain the fluid in your eardrum (myringotomy). In this procedure, a small tube is placed in the eardrum to:  Drain the fluid.  Restore the air in the middle ear space.  Follow these instructions at home:  Take over-the-counter and prescription medicines only as told by your health care provider.  Use techniques to help pop your ears as recommended by your health care provider. These may include: ? Chewing gum. ? Yawning. ? Frequent, forceful swallowing. ? Closing your mouth, holding your nose closed, and gently blowing as if you are trying to blow air out of your nose.  Do not do any of the following until your health care provider approves: ? Travel to high altitudes. ? Fly in airplanes. ? Work in a pressurized cabin or room. ? Scuba dive.  Keep your ears dry. Dry your ears completely after showering or bathing.  Do not smoke.  Keep all follow-up visits as told by your health care provider. This is important. Contact a health care provider if:  Your symptoms do not go away after treatment.  Your symptoms come back after treatment.  You are   unable to pop your ears.  You have: ? A fever. ? Pain in your ear. ? Pain in your head or neck. ? Fluid draining from your ear.  Your hearing suddenly changes.  You become very dizzy.  You lose your balance. This information is not intended to replace advice given to you by your health care provider. Make sure you discuss any questions you have with your health care provider. Document Released: 01/14/2015 Document Revised: 05/26/2015  Document Reviewed: 01/06/2014 [employer]vier Interactive Patient Education  2018 [employer]  

## 2017-04-15 NOTE — Progress Notes (Signed)
Melissa [name] is a 42 y.o. female with the following history as recorded in [employer]:  Patient Active Problem List   Diagnosis Date Noted  . Piriformis syndrome, left 02/25/2017  . Tendinitis of extensor tendon of right hand 06/11/2016  . Mixed stress and urge urinary incontinence 02/07/2016  . Carpal tunnel syndrome 01/03/2016  . Polyarthralgia 01/03/2016  . Galactocele associated with childbirth 11/07/2015  . Pregnancy with poor obstetric history 02/21/2015  . Multigravida of advanced maternal age 75/20/2017  . Radial tunnel syndrome 06/17/2014  . Cervical radiculopathy 06/10/2014  . Bilateral low back pain with right-sided sciatica 06/10/2014  . Bipolar depression ([place]) 05/17/2014  . Chronic night sweats 06/26/2013  . History of recurrent spontaneous abortion, not currently pregnant 01/26/2012  . Fluid retention 01/25/2012  . Obesity (BMI 30.0-34.9) 01/25/2012    Current Outpatient Medications  Medication Sig Dispense Refill  . levonorgestrel (MIRENA) 20 MCG/24HR IUD 1 each by Intrauterine route once.    . selenium sulfide (SELSUN) 2.5 % shampoo Apply 1 application topically daily as needed for irritation. 118 mL 0  . fluticasone (FLONASE) 50 MCG/ACT nasal spray Place 2 sprays into both nostrils daily. 16 g 6  . gabapentin (NEURONTIN) 100 MG capsule Take 1 capsule (100 mg total) by mouth at bedtime. 90 capsule 3  . ibuprofen (ADVIL,MOTRIN) 800 MG tablet Take 1 tablet (800 mg total) by mouth every 8 (eight) hours as needed. 90 tablet 0  . venlafaxine XR (EFFEXOR-XR) 150 MG 24 hr capsule Take 1 capsule (150 mg total) by mouth daily with breakfast. 90 capsule 3   No current facility-administered medications for this visit.     Allergies: Patient has no known allergies.  Past Medical History:  Diagnosis Date  . Anxiety   . Arthritis   . Carpal tunnel syndrome   . Depression   . Hammer toe 04/2013   bilateral 4th and 5th   . Ingrown left big toenail 04/2013  . Mental  disorder   . Neuromuscular disorder ([place])   . Post term pregnancy over 40 weeks 09/21/2015  . SVD (spontaneous vaginal delivery) 09/22/2015  . UTI (lower urinary tract infection)     Past Surgical History:  Procedure Laterality Date  . BREAST SURGERY    . CORRECTION HAMMER TOE Bilateral   . CRYOABLATION  2004  . CYSTOSCOPY WITH HYDRODISTENSION AND BIOPSY  07/28/2004   with urethral dilatation  . DILATION AND CURETTAGE OF UTERUS    . DILATION AND EVACUATION  12/21/2011   Procedure: DILATATION AND EVACUATION;  Surgeon: Terrance Mass, MD;  Location: [facility];  Service: Gynecology;  Laterality: N/A;  first trimester   . HAMMER TOE SURGERY Bilateral 04/20/2013   Procedure: BILATERAL HAMMER TOE REPAIR AND EXCISION NAIL PERMANENT LEFT FIRST;  Surgeon: Harriet Masson, DPM;  Location: [facility];  Service: Podiatry;  Laterality: Bilateral;  Hammer toe repair fourth and fifth toes bilateral feet with screw placement fourth toes and partial nail excision left great toe  . HYSTEROSALPINGOGRAM    . TOE SURGERY     ingrown toenail  . WISDOM TOOTH EXTRACTION      Family History  Problem Relation Age of Onset  . Cancer Father        lung cancer  . Birth defects Paternal Grandfather   . Seizures Mother   . Arthritis Mother   . Thyroid disease Maternal Grandmother   . Heart disease Paternal Grandmother        2 CABG  Social History   Tobacco Use  . Smoking status: Never Smoker  . Smokeless tobacco: Never Used  Substance Use Topics  . Alcohol use: No    Comment: occasionally    Subjective:  Patient presents to transfer her care from another provider in the office; is concerned that her ears are "clogged up" and needs to have ear wax removed; feels like her hearing is down; is under care of sports medicine provider here for arthralgias- he is prescribing Neurontin and Effexor for her; she is under care of GYN and urology; up to date on mammogram and pap  smear;   Objective:  Vitals:   04/15/17 1210  BP: 112/80  Pulse: 82  Temp: 97.8 F (36.6 C)  TempSrc: Oral  SpO2: 99%  Weight: 170 lb 1.9 oz (77.2 kg)  Height: 5\' 1"  (1.549 m)    General: Well developed, well nourished, in no acute distress  Skin : Warm and dry.  Head: Normocephalic and atraumatic  Eyes: Sclera and conjunctiva clear; pupils round and reactive to light; extraocular movements intact  Ears: External normal; canals clear; tympanic membranes normal  Oropharynx: Pink, supple. No suspicious lesions  Neck: Supple without thyromegaly, adenopathy  Lungs: Respirations unlabored; clear to auscultation bilaterally without wheeze, rales, rhonchi  Vessels: Symmetric bilaterally  Neurologic: Alert and oriented; speech intact; face symmetrical; moves all extremities well; CNII-XII intact without focal deficit  Assessment:  1. Decreased hearing of both ears     Plan:  Suspect eustachian tube dysfunction; trial of Flonase; if no improvement, follow-up with ENT; Refill is given on Selsun shampoo as requested; Continue with GYN for preventive health care needs;   No follow-ups on file.  Orders Placed This Encounter  Procedures  . Ambulatory referral to ENT    Referral Priority:   Routine    Referral Type:   Consultation    Referral Reason:   Specialty Services Required    Requested Specialty:   Otolaryngology    Number of Visits Requested:   1    Requested Prescriptions   Signed Prescriptions Disp Refills  . selenium sulfide (SELSUN) 2.5 % shampoo 118 mL 0    Sig: Apply 1 application topically daily as needed for irritation.  . fluticasone (FLONASE) 50 MCG/ACT nasal spray 16 g 6    Sig: Place 2 sprays into both nostrils daily.

## 2017-04-29 ENCOUNTER — Ambulatory Visit: Payer: Self-pay | Admitting: Nurse Practitioner

## 2017-05-02 ENCOUNTER — Encounter: Payer: Self-pay | Admitting: Family

## 2017-05-29 ENCOUNTER — Telehealth: Payer: Self-pay | Admitting: Family

## 2017-05-29 MED ORDER — SELENIUM SULFIDE 2.5 % EX LOTN: 1.0000 "application " | TOPICAL_LOTION | Freq: Every day | CUTANEOUS | 0 refills | Status: DC | PRN

## 2017-05-29 NOTE — Telephone Encounter (Signed)
Copied from [place] [phone]. Topic: Quick Communication - Rx Refill/Question >> May 29, 2017 12:04 PM Scherrie Gerlach wrote: Medication: selenium sulfide (SELSUN) 2.5 % shampoo [facility] ([address] [place] [phone] (Phone) [phone] (Fax)

## 2017-06-14 ENCOUNTER — Ambulatory Visit (INDEPENDENT_AMBULATORY_CARE_PROVIDER_SITE_OTHER): Payer: Medicare Other | Admitting: Neurology

## 2017-06-14 ENCOUNTER — Encounter

## 2017-06-14 ENCOUNTER — Other Ambulatory Visit: Payer: Self-pay | Admitting: *Deleted

## 2017-06-14 DIAGNOSIS — G5601 Carpal tunnel syndrome, right upper limb: Secondary | ICD-10-CM

## 2017-06-14 NOTE — Procedures (Signed)
[facility]  [place], [place]  [place], [place] [address] Tel: [phone] Fax:  [phone] Test Date:  06/14/2017  Patient: Melissa [name] DOB: [birth year] Physician: Narda Amber, DO  Sex: Female Height: 5\' 1"  Ref Phys: Hulan Saas, DO  ID#: [id number] Temp: 34.6C Technician:    Patient Complaints: This is a 42 year old female referred for evaluation of right arm pain and paresthesia.  NCV & EMG Findings: Extensive electrodiagnostic testing of the right upper extremity shows:  1. Right mixed palmar sensory responses are very mildly prolonged. Right median, ulnar, and radial sensory responses are within normal limits. 2. Right median, ulnar, and radial motor responses are within normal limits. 3. There is no evidence of active or chronic motor axon loss changes affecting any of the tested muscles. Motor unit configuration and recruitment pattern is within normal limits.  Impression: Right median neuropathy at or distal to the wrist, consistent with clinical diagnosis of carpal tunnel syndrome. Overall, these findings are very mild in degree electrically.   ___________________________ Narda Amber, DO    Nerve Conduction Studies Anti Sensory Summary Table   Site NR Peak (ms) Norm Peak (ms) P-T Amp (V) Norm P-T Amp  Right Median Anti Sensory (2nd Digit)  Wrist    3.3 <3.4 33.8 >20  Right Radial Anti Sensory (Base 1st Digit)  34.6C  Wrist    2.0 <2.7 30.0 >18  Right Ulnar Anti Sensory (5th Digit)  34.6C  Wrist    2.5 <3.1 28.1 >12   Motor Summary Table   Site NR Onset (ms) Norm Onset (ms) O-P Amp (mV) Norm O-P Amp Site1 Site2 Delta-0 (ms) Dist (cm) Vel (m/s) Norm Vel (m/s)  Right Median Motor (Abd Poll Brev)  34.6C  Wrist    3.4 <3.9 6.3 >6 Elbow Wrist 4.6 29.0 63 >50  Elbow    8.0  8.5  Axilla Elbow 4.7 0.0    Axilla    3.3  3.3         Right Radial Motor (Ext Ind Prop)  34.6C  7cm    1.6 <3.1 8.3 >6 ACF 7cm 2.1 12.0 57 >50  ACF    3.7   8.3  B-Elbow ACF 0.9 7.0 78   B-Elbow    4.6  7.1  A-el6ow B-Elbow 0.5 3.0 60   A-elbow    5.1  6.6         Right Ulnar Motor (Abd Dig Minimi)  34.6C  Wrist    2.2 <3.1 11.7 >7 B Elbow Wrist 3.6 23.0 64 >50  B Elbow    5.8  10.6  A Elbow B Elbow 1.6 10.0 62 >50  A Elbow    7.4  10.2          Comparison Summary Table   Site NR Peak (ms) Norm Peak (ms) P-T Amp (V) Site1 Site2 Delta-P (ms) Norm Delta (ms)  Right Median/Ulnar Palm Comparison (Wrist - 8cm)  34.6C  Median Palm    2.0 <2.2 53.5 Median Palm Ulnar Palm 0.5   Ulnar Palm    1.5 <2.2 23.6       EMG   Side Muscle Ins Act Fibs Psw Fasc Number Recrt Dur Dur. Amp Amp. Poly Poly. Comment  Right 1stDorInt Nml Nml Nml Nml Nml Nml Nml Nml Nml Nml Nml Nml N/A  Right Abd Poll Brev Nml Nml Nml Nml Nml Nml Nml Nml Nml Nml Nml Nml N/A  Right Ext Indicis Nml Nml Nml Nml Nml Nml  Nml Nml Nml Nml Nml Nml N/A  Right PronatorTeres Nml Nml Nml Nml Nml Nml Nml Nml Nml Nml Nml Nml N/A  Right Biceps Nml Nml Nml Nml Nml Nml Nml Nml Nml Nml Nml Nml N/A  Right Triceps Nml Nml Nml Nml Nml Nml Nml Nml Nml Nml Nml Nml N/A  Right Deltoid Nml Nml Nml Nml Nml Nml Nml Nml Nml Nml Nml Nml N/A      Waveforms:

## 2017-06-17 DIAGNOSIS — M542 Cervicalgia: Secondary | ICD-10-CM | POA: Insufficient documentation

## 2017-06-28 ENCOUNTER — Other Ambulatory Visit: Payer: Self-pay | Admitting: Family

## 2017-08-27 ENCOUNTER — Encounter (INDEPENDENT_AMBULATORY_CARE_PROVIDER_SITE_OTHER): Payer: Medicare Other | Admitting: Podiatry

## 2017-08-27 NOTE — Progress Notes (Signed)
This encounter was created in error - please disregard.

## 2017-09-06 NOTE — Progress Notes (Deleted)
[name] [address] [place], [place] [address] Phone: [phone] Subjective:    I'm seeing this patient by the request  of:    CC:   Melissa [name]  Melissa [name] is a 42 y.o. female coming in with complaint of ***  Onset-  Location Duration-  Character- Aggravating factors- Reliving factors-  Therapies tried-  Severity-     Past Medical History:  Diagnosis Date  . Anxiety   . Arthritis   . Carpal tunnel syndrome   . Depression   . Hammer toe 04/2013   bilateral 4th and 5th   . Ingrown left big toenail 04/2013  . Mental disorder   . Neuromuscular disorder ([place])   . Post term pregnancy over 40 weeks 09/21/2015  . SVD (spontaneous vaginal delivery) 09/22/2015  . UTI (lower urinary tract infection)    Past Surgical History:  Procedure Laterality Date  . BREAST SURGERY    . CORRECTION HAMMER TOE Bilateral   . CRYOABLATION  2004  . CYSTOSCOPY WITH HYDRODISTENSION AND BIOPSY  07/28/2004   with urethral dilatation  . DILATION AND CURETTAGE OF UTERUS    . DILATION AND EVACUATION  12/21/2011   Procedure: DILATATION AND EVACUATION;  Surgeon: Melissa Mass, MD;  Location: [facility];  Service: Gynecology;  Laterality: N/A;  first trimester   . HAMMER TOE SURGERY Bilateral 04/20/2013   Procedure: BILATERAL HAMMER TOE REPAIR AND EXCISION NAIL PERMANENT LEFT FIRST;  Surgeon: Melissa [name], DPM;  Location: [facility];  Service: Podiatry;  Laterality: Bilateral;  Hammer toe repair fourth and fifth toes bilateral feet with screw placement fourth toes and partial nail excision left great toe  . HYSTEROSALPINGOGRAM    . TOE SURGERY     ingrown toenail  . WISDOM TOOTH EXTRACTION     Social History   Socioeconomic History  . Marital status: Single    Spouse name: Not on file  . Number of children: Not on file  . Years of education: Not on file  . Highest education level: Not on file  Occupational History    . Not on file  Social Needs  . Financial resource strain: Not on file  . Food insecurity:    Worry: Not on file    Inability: Not on file  . Transportation needs:    Medical: Not on file    Non-medical: Not on file  Tobacco Use  . Smoking status: Never Smoker  . Smokeless tobacco: Never Used  Substance and Sexual Activity  . Alcohol use: No    Comment: occasionally  . Drug use: No  . Sexual activity: Yes    Birth control/protection: IUD  Lifestyle  . Physical activity:    Days per week: Not on file    Minutes per session: Not on file  . Stress: Not on file  Relationships  . Social connections:    Talks on phone: Not on file    Gets together: Not on file    Attends religious service: Not on file    Active member of club or organization: Not on file    Attends meetings of clubs or organizations: Not on file    Relationship status: Not on file  Other Topics Concern  . Not on file  Social History Narrative  . Not on file   No Known Allergies Family History  Problem Relation Age of Onset  . Cancer Father        lung  cancer  . Birth defects Paternal Grandfather   . Seizures Mother   . Arthritis Mother   . Thyroid disease Maternal Grandmother   . Heart disease Paternal Grandmother        2 CABG    Current Outpatient Medications (Endocrine & Metabolic):  .  levonorgestrel (MIRENA) 20 MCG/24HR IUD, 1 each by Intrauterine route once.   Current Outpatient Medications (Respiratory):  .  fluticasone (FLONASE) 50 MCG/ACT nasal spray, Place 2 sprays into both nostrils daily.  Current Outpatient Medications (Analgesics):  .  ibuprofen (ADVIL,MOTRIN) 800 MG tablet, Take 1 tablet (800 mg total) by mouth every 8 (eight) hours as needed.   Current Outpatient Medications (Other):  .  gabapentin (NEURONTIN) 100 MG capsule, Take 1 capsule (100 mg total) by mouth at bedtime. .  selenium sulfide (SELSUN) 2.5 % shampoo, USE 1 APPLICATION EXTERNALLY ONCE DAILY AS NEEDED FOR   IRRITATION .  venlafaxine XR (EFFEXOR-XR) 150 MG 24 hr capsule, Take 1 capsule (150 mg total) by mouth daily with breakfast.    Past medical history, social, surgical and family history all reviewed in electronic medical record.  No pertanent information unless stated regarding to the chief complaint.   Review of Systems:  No headache, visual changes, nausea, vomiting, diarrhea, constipation, dizziness, abdominal pain, skin rash, fevers, chills, night sweats, weight loss, swollen lymph nodes, body aches, joint swelling, muscle aches, chest pain, shortness of breath, mood changes.   Objective  unknown if currently breastfeeding. Systems examined below as of    General: No apparent distress alert and oriented x3 mood and affect normal, dressed appropriately.  HEENT: Pupils equal, extraocular movements intact  Respiratory: Patient's speak in full sentences and does not appear short of breath  Cardiovascular: No lower extremity edema, non tender, no erythema  Skin: Warm dry intact with no signs of infection or rash on extremities or on axial skeleton.  Abdomen: Soft nontender  Neuro: Cranial nerves II through XII are intact, neurovascularly intact in all extremities with 2+ DTRs and 2+ pulses.  Lymph: No lymphadenopathy of posterior or anterior cervical chain or axillae bilaterally.  Gait normal with good balance and coordination.  MSK:  Non tender with full range of motion and good stability and symmetric strength and tone of shoulders, elbows, wrist, hip, knee and ankles bilaterally.     Impression and Recommendations:     This case required medical decision making of moderate complexity. The above documentation has been reviewed and is accurate and complete Melissa Pulley, DO       Note: This dictation was prepared with Dragon dictation along with smaller phrase technology. Any transcriptional errors that result from this process are unintentional.

## 2017-09-09 ENCOUNTER — Encounter

## 2017-09-09 ENCOUNTER — Ambulatory Visit: Payer: Self-pay | Admitting: Family Medicine

## 2017-09-10 ENCOUNTER — Encounter: Payer: Self-pay | Admitting: Podiatry

## 2017-09-10 ENCOUNTER — Ambulatory Visit (INDEPENDENT_AMBULATORY_CARE_PROVIDER_SITE_OTHER): Payer: Medicare Other | Admitting: Podiatry

## 2017-09-10 ENCOUNTER — Ambulatory Visit (INDEPENDENT_AMBULATORY_CARE_PROVIDER_SITE_OTHER): Payer: Medicare Other

## 2017-09-10 DIAGNOSIS — M779 Enthesopathy, unspecified: Secondary | ICD-10-CM

## 2017-09-10 DIAGNOSIS — M7751 Other enthesopathy of right foot: Secondary | ICD-10-CM

## 2017-09-10 NOTE — Patient Instructions (Signed)
Pre-Operative Instructions  Congratulations, you have decided to take an important step towards improving your quality of life.  You can be assured that the doctors and staff at [facility] will be with you every step of the way.  Here are some important things you should know:  1. Plan to be at the surgery center/hospital at least 1 (one) hour prior to your scheduled time, unless otherwise directed by the surgical center/hospital staff.  You must have a responsible adult accompany you, remain during the surgery and drive you home.  Make sure you have directions to the surgical center/hospital to ensure you arrive on time. 2. If you are having surgery at [facility] or [facility] hospitals, you will need a copy of your medical history and physical form from your family physician within one month prior to the date of surgery. We will give you a form for your primary physician to complete.  3. We make every effort to accommodate the date you request for surgery.  However, there are times where surgery dates or times have to be moved.  We will contact you as soon as possible if a change in schedule is required.   4. No aspirin/ibuprofen for one week before surgery.  If you are on aspirin, any non-steroidal anti-inflammatory medications (Mobic, Aleve, Ibuprofen) should not be taken seven (7) days prior to your surgery.  You make take Tylenol for pain prior to surgery.  5. Medications - If you are taking daily heart and blood pressure medications, seizure, reflux, allergy, asthma, anxiety, pain or diabetes medications, make sure you notify the surgery center/hospital before the day of surgery so they can tell you which medications you should take or avoid the day of surgery. 6. No food or drink after midnight the night before surgery unless directed otherwise by surgical center/hospital staff. 7. No alcoholic beverages 24-hours prior to surgery.  No smoking 24-hours prior or 24-hours after  surgery. 8. Wear loose pants or shorts. They should be loose enough to fit over bandages, boots, and casts. 9. Don't wear slip-on shoes. Sneakers are preferred. 10. Bring your boot with you to the surgery center/hospital.  Also bring crutches or a walker if your physician has prescribed it for you.  If you do not have this equipment, it will be provided for you after surgery. 11. If you have not been contacted by the surgery center/hospital by the day before your surgery, call to confirm the date and time of your surgery. 12. Leave-time from work may vary depending on the type of surgery you have.  Appropriate arrangements should be made prior to surgery with your employer. 13. Prescriptions will be provided immediately following surgery by your doctor.  Fill these as soon as possible after surgery and take the medication as directed. Pain medications will not be refilled on weekends and must be approved by the doctor. 14. Remove nail polish on the operative foot and avoid getting pedicures prior to surgery. 15. Wash the night before surgery.  The night before surgery wash the foot and leg well with water and the antibacterial soap provided. Be sure to pay special attention to beneath the toenails and in between the toes.  Wash for at least three (3) minutes. Rinse thoroughly with water and dry well with a towel.  Perform this wash unless told not to do so by your physician.  Enclosed: 1 Ice pack (please put in freezer the night before surgery)   1 Hibiclens skin cleaner     Pre-op instructions  If you have any questions regarding the instructions, please do not hesitate to call our office.  [place]: [address], [place], [place] [address] -- [phone]  [place]: [address] [place], [place] [address] -- [phone]  [place]: [address]  [place], [place] [address] -- [phone]  [place]: [address], [address], [place], [place] [address] -- [phone]  Website:  [email or web address] 

## 2017-09-11 NOTE — Progress Notes (Signed)
She presents today complaining of painful area to the third digit lateral aspect right foot.  She is also complaining of painful scar to the dorsal aspect of the left foot.  Objective: Vital signs are stable she is alert and oriented x3.  She has a palpable nodular mass to the lateral aspect of the DIPJ third digit right foot.  There is no reactive hyperkeratosis and no skin breakdown.  Pulses remain palpable bilaterally.  Left foot demonstrates a painful keloid type scar secondary to swelling after surgery.  Radiographs taken today do demonstrate a PIPJ and DIPJ arthrodesis of the third toe resulting in some reactive bone growth laterally which is resulting in her irritation.  Assessment: Painful exostosis DIPJ third digit right foot laterally.  Painful scar left foot.  Plan: Consented her today excision painful lesion benign left and digital exostectomy fourth digit right foot.  We discussed the possible postop complications which may include but are not limited to postop pain bleeding swelling infection recurrence need further surgery overcorrection under correction re-scarring painful scars.  She signed all 3 pages of the consent form I will follow-up with her in the near future for surgical intervention.

## 2017-09-13 NOTE — Progress Notes (Signed)
[name] [place], Melissa [name] [address] Phone: [phone] Subjective:    I Melissa [name] am serving as a Education administrator for Dr. Hulan Saas.  :    CC: All over body pain especially neck and hands  IRC:Melissa [name]  GISSELL BARRA is a 42 y.o. female coming in with complaint of elbow pain. Hip pain as well. Neck pain. Still has weakness in the arm. Has been cracking her neck.  Patient has been seen by me and diagnosed with a carpal tunnel.  Was responding only somewhat to injections.  Sent to discuss surgical intervention.  Hand surgeon is concerned more for cervical radiculopathy.  Patient has had neck pain.  Seems to have tightness.  Also right elbow pain.  Was found to have lateral epicondylitis.  Patient is just concerned for all the pain all over.  Found to have vitamin D deficiency as well as elevated sedimentation rate but rheumatology labs have been unremarkable.     Past Medical History:  Diagnosis Date  . Anxiety   . Arthritis   . Carpal tunnel syndrome   . Depression   . Hammer toe 04/2013   bilateral 4th and 5th   . Ingrown left big toenail 04/2013  . Mental disorder   . Neuromuscular disorder ([place])   . Post term pregnancy over 40 weeks 09/21/2015  . SVD (spontaneous vaginal delivery) 09/22/2015  . UTI (lower urinary tract infection)    Past Surgical History:  Procedure Laterality Date  . BREAST SURGERY    . CORRECTION HAMMER TOE Bilateral   . CRYOABLATION  2004  . CYSTOSCOPY WITH HYDRODISTENSION AND BIOPSY  07/28/2004   with urethral dilatation  . DILATION AND CURETTAGE OF UTERUS    . DILATION AND EVACUATION  12/21/2011   Procedure: DILATATION AND EVACUATION;  Surgeon: Terrance Mass, MD;  Location: [facility];  Service: Gynecology;  Laterality: N/A;  first trimester   . HAMMER TOE SURGERY Bilateral 04/20/2013   Procedure: BILATERAL HAMMER TOE REPAIR AND EXCISION NAIL PERMANENT LEFT FIRST;  Surgeon: Harriet Masson, DPM;  Location: [place] [facility];  Service: Podiatry;  Laterality: Bilateral;  Hammer toe repair fourth and fifth toes bilateral feet with screw placement fourth toes and partial nail excision left great toe  . HYSTEROSALPINGOGRAM    . TOE SURGERY     ingrown toenail  . WISDOM TOOTH EXTRACTION     Social History   Socioeconomic History  . Marital status: Single    Spouse name: Not on file  . Number of children: Not on file  . Years of education: Not on file  . Highest education level: Not on file  Occupational History  . Not on file  Social Needs  . Financial resource strain: Not on file  . Food insecurity:    Worry: Not on file    Inability: Not on file  . Transportation needs:    Medical: Not on file    Non-medical: Not on file  Tobacco Use  . Smoking status: Never Smoker  . Smokeless tobacco: Never Used  Substance and Sexual Activity  . Alcohol use: No    Comment: occasionally  . Drug use: No  . Sexual activity: Yes    Birth control/protection: IUD  Lifestyle  . Physical activity:    Days per week: Not on file    Minutes per session: Not on file  . Stress: Not on file  Relationships  . Social connections:  Talks on phone: Not on file    Gets together: Not on file    Attends religious service: Not on file    Active member of club or organization: Not on file    Attends meetings of clubs or organizations: Not on file    Relationship status: Not on file  Other Topics Concern  . Not on file  Social History Narrative  . Not on file   No Known Allergies Family History  Problem Relation Age of Onset  . Cancer Father        lung cancer  . Birth defects Paternal Grandfather   . Seizures Mother   . Arthritis Mother   . Thyroid disease Maternal Grandmother   . Heart disease Paternal Grandmother        2 CABG    Current Outpatient Medications (Endocrine & Metabolic):  .  levonorgestrel (MIRENA) 20 MCG/24HR IUD, 1 each by Intrauterine route  once.   Current Outpatient Medications (Respiratory):  .  fluticasone (FLONASE) 50 MCG/ACT nasal spray, Place 2 sprays into both nostrils daily.  Current Outpatient Medications (Analgesics):  .  ibuprofen (ADVIL,MOTRIN) 800 MG tablet, Take 1 tablet (800 mg total) by mouth every 8 (eight) hours as needed. .  meloxicam (MOBIC) 7.5 MG tablet, TAKE 1 TABLET BY MOUTH ONCE DAILY WITH FOOD  Current Outpatient Medications (Hematological):  .  tranexamic acid (LYSTEDA) 650 MG TABS tablet, Take 650 mg by mouth daily.  Current Outpatient Medications (Other):  .  gabapentin (NEURONTIN) 100 MG capsule, Take 1 capsule (100 mg total) by mouth at bedtime. Marland Kitchen  venlafaxine XR (EFFEXOR-XR) 150 MG 24 hr capsule, Take 1 capsule (150 mg total) by mouth daily with breakfast. .  selenium sulfide (SELSUN) 2.5 % shampoo, USE 1 APPLICATION EXTERNALLY ONCE DAILY AS NEEDED FOR  IRRITATION    Past medical history, social, surgical and family history all reviewed in electronic medical record.  No pertanent information unless stated regarding to the chief complaint.   Review of Systems:  No headache, visual changes, nausea, vomiting, diarrhea, constipation, dizziness, abdominal pain, skin rash, fevers, chills, night sweats, weight loss, swollen lymph nodes,  chest pain, shortness of breath, mood changes.  Positive muscle aches, body aches and joint swelling  Objective  Blood pressure 130/80, pulse 75, height 5\' 1"  (1.549 m), weight 180 lb (81.6 kg), SpO2 99 %, unknown if currently breastfeeding.    General: No apparent distress alert and oriented x3 mood and affect normal, dressed appropriately.  HEENT: Pupils equal, extraocular movements intact  Respiratory: Patient's speak in full sentences and does not appear short of breath  Cardiovascular: No lower extremity edema, non tender, no erythema  Skin: Warm dry intact with no signs of infection or rash on extremities or on axial skeleton.  Abdomen: Soft nontender    Neuro: Cranial nerves II through XII are intact, neurovascularly intact in all extremities with 2+ DTRs and 2+ pulses.  Lymph: No lymphadenopathy of posterior or anterior cervical chain or axillae bilaterally.  Gait normal with good balance and coordination.  MSK:  tender with full range of motion and good stability and symmetric strength and tone of shoulders, elbows, wrist, hip, knee and ankles bilaterally.  Neck exam shows loss of lordosis.  Positive Spurling's with radicular symptoms on the right side.  Patient has tightness in the neck in all planes it seems to be voluntary and possibly involuntary guarding. Back Exam:  Inspection: Unremarkable  Motion: Flexion 45 deg, Extension 25 deg, Side Bending to  35 deg bilaterally,  Rotation to 35 deg bilaterally  SLR laying: Negative  XSLR laying: Negative  Palpable tenderness: Tender to palpation the paraspinal musculature lumbar spine right greater than left. FABER: Tightness bilaterally. Sensory change: Gross sensation intact to all lumbar and sacral dermatomes.  Reflexes: 2+ at both patellar tendons, 2+ at achilles tendons, Babinski's downgoing.  Strength at foot  Plantar-flexion: 5/5 Dorsi-flexion: 5/5 Eversion: 5/5 Inversion: 5/5  Leg strength  Quad: 5/5 Hamstring: 5/5 Hip flexor: 5/5 Hip abductors: 4/5 but symmetric Gait unremarkable.    Impression and Recommendations:     This case required medical decision making of moderate complexity. The above documentation has been reviewed and is accurate and complete Lyndal Pulley, DO       Note: This dictation was prepared with Dragon dictation along with smaller phrase technology. Any transcriptional errors that result from this process are unintentional.

## 2017-09-16 ENCOUNTER — Ambulatory Visit (INDEPENDENT_AMBULATORY_CARE_PROVIDER_SITE_OTHER): Payer: Medicare Other | Admitting: Family Medicine

## 2017-09-16 ENCOUNTER — Other Ambulatory Visit: Payer: Self-pay | Admitting: Family

## 2017-09-16 ENCOUNTER — Telehealth: Payer: Self-pay | Admitting: Family

## 2017-09-16 ENCOUNTER — Encounter: Payer: Self-pay | Admitting: Family Medicine

## 2017-09-16 DIAGNOSIS — M5412 Radiculopathy, cervical region: Secondary | ICD-10-CM | POA: Diagnosis not present

## 2017-09-16 DIAGNOSIS — M542 Cervicalgia: Secondary | ICD-10-CM | POA: Diagnosis not present

## 2017-09-16 MED ORDER — SELENIUM SULFIDE 2.5 % EX LOTN: TOPICAL_LOTION | CUTANEOUS | 1 refills | Status: DC

## 2017-09-16 NOTE — Telephone Encounter (Signed)
selenium sulfide (SELSUN) 2.5 % shampoo    [facility] ([address]), [place] [phone] (Phone) [phone] (Fax)   Patient is requesting a refill on this RX, she would also like refills on this as well.

## 2017-09-16 NOTE — Assessment & Plan Note (Addendum)
Worsening symptoms.  We discussed with patient failing all other conservative therapy at this time, I do feel that advanced imaging of the neck would be warranted.  Patient did have an MRI greater than 3 years ago of the neck done.  Patient was found to have mild degenerative changes but no true nerve impingement.  I do believe that at this point there would likely be a nerve impingement and will consider the possibility of an epidural.  I believe the patient will respond to this.  No change of medicine until afterwards.  Prednisone given note for 5-day burst if needed. Spent  25 minutes with patient face-to-face and had greater than 50% of counseling including as described above in assessment and plan.

## 2017-09-16 NOTE — Patient Instructions (Signed)
Good to see you  We will do MRI of your neck  Call [phone] to schedule Ice is still good  Try DHEA 50 mg daily to see if this could help your adrenal glands as well and help with some of your pain  Once we see you neck MRI we will order epidural of your neck  Once you have the injection then I want to see you again 3 weeks after that

## 2017-09-17 ENCOUNTER — Ambulatory Visit
Admission: RE | Admit: 2017-09-17 | Discharge: 2017-09-17 | Disposition: A | Discharge status: Home / Self Care | Payer: Medicare Other | Source: Ambulatory Visit | Attending: Family Medicine | Admitting: Family Medicine

## 2017-09-17 DIAGNOSIS — M542 Cervicalgia: Secondary | ICD-10-CM

## 2017-09-18 NOTE — Telephone Encounter (Signed)
RX called in on 9/16 with 1 refill.

## 2017-09-19 ENCOUNTER — Telehealth: Payer: Self-pay | Admitting: *Deleted

## 2017-09-19 DIAGNOSIS — M5412 Radiculopathy, cervical region: Secondary | ICD-10-CM

## 2017-09-19 NOTE — Telephone Encounter (Signed)
Discussed with pt, ordered epidural & sent to [facility].

## 2017-09-19 NOTE — Telephone Encounter (Signed)
Copied from [place] [id number]. Topic: General - Other >> Sep 18, 2017  4:15 PM Ivar Drape wrote: Reason for CRM:   Patient had her MRI yesterday and she is in a lot of pain.  She would like to know what the next step is.

## 2017-09-23 ENCOUNTER — Other Ambulatory Visit: Payer: Self-pay | Admitting: Family

## 2017-09-23 MED ORDER — SALICYLIC ACID 3 % EX SHAM: MEDICATED_SHAMPOO | CUTANEOUS | 1 refills | Status: AC

## 2017-09-30 ENCOUNTER — Other Ambulatory Visit (INDEPENDENT_AMBULATORY_CARE_PROVIDER_SITE_OTHER): Payer: Medicare Other

## 2017-09-30 ENCOUNTER — Ambulatory Visit (INDEPENDENT_AMBULATORY_CARE_PROVIDER_SITE_OTHER): Payer: Medicare Other | Admitting: Family

## 2017-09-30 ENCOUNTER — Encounter: Payer: Self-pay | Admitting: Family

## 2017-09-30 VITALS — BP 114/78 | HR 67 | Temp 98.0°F | Ht 61.0 in | Wt 179.0 lb

## 2017-09-30 DIAGNOSIS — R635 Abnormal weight gain: Secondary | ICD-10-CM

## 2017-09-30 LAB — COMPREHENSIVE METABOLIC PANEL WITH GFR
ALT: 11 U/L (ref 0–35)
AST: 14 U/L (ref 0–37)
Albumin: 4 g/dL (ref 3.5–5.2)
Alkaline Phosphatase: 47 U/L (ref 39–117)
BUN: 7 mg/dL (ref 6–23)
CO2: 28 meq/L (ref 19–32)
Calcium: 9.4 mg/dL (ref 8.4–10.5)
Chloride: 104 meq/L (ref 96–112)
Creatinine, Ser: 0.59 mg/dL (ref 0.40–1.20)
GFR: 118.88 mL/min
Glucose, Bld: 73 mg/dL (ref 70–99)
Potassium: 3.9 meq/L (ref 3.5–5.1)
Sodium: 138 meq/L (ref 135–145)
Total Bilirubin: 0.5 mg/dL (ref 0.2–1.2)
Total Protein: 7 g/dL (ref 6.0–8.3)

## 2017-09-30 LAB — CBC WITH DIFFERENTIAL/PLATELET
Basophils Absolute: 0.1 10*3/uL (ref 0.0–0.1)
Basophils Relative: 0.9 % (ref 0.0–3.0)
Eosinophils Absolute: 0.1 10*3/uL (ref 0.0–0.7)
Eosinophils Relative: 1 % (ref 0.0–5.0)
HCT: 39.9 % (ref 36.0–46.0)
Hemoglobin: 13.9 g/dL (ref 12.0–15.0)
Lymphocytes Relative: 25.1 % (ref 12.0–46.0)
Lymphs Abs: 1.4 10*3/uL (ref 0.7–4.0)
MCHC: 34.8 g/dL (ref 30.0–36.0)
MCV: 89.4 fl (ref 78.0–100.0)
Monocytes Absolute: 0.4 10*3/uL (ref 0.1–1.0)
Monocytes Relative: 7 % (ref 3.0–12.0)
Neutro Abs: 3.7 10*3/uL (ref 1.4–7.7)
Neutrophils Relative %: 66 % (ref 43.0–77.0)
Platelets: 263 10*3/uL (ref 150.0–400.0)
RBC: 4.46 Mil/uL (ref 3.87–5.11)
RDW: 12.6 % (ref 11.5–15.5)
WBC: 5.7 10*3/uL (ref 4.0–10.5)

## 2017-09-30 LAB — TSH: TSH: 0.63 u[IU]/mL (ref 0.35–4.50)

## 2017-09-30 LAB — FOLLICLE STIMULATING HORMONE: FSH: 4.4 m[IU]/mL

## 2017-09-30 LAB — TESTOSTERONE: Testosterone: 23.08 ng/dL (ref 15.00–40.00)

## 2017-09-30 LAB — HEMOGLOBIN A1C: Hgb A1c MFr Bld: 5.6 % (ref 4.6–6.5)

## 2017-09-30 LAB — LUTEINIZING HORMONE: LH: 2.54 m[IU]/mL

## 2017-09-30 NOTE — Progress Notes (Signed)
Melissa [name] is a 42 y.o. female with the following history as recorded in Epic[employer]:  Patient Active Problem List   Diagnosis Date Noted  . Right lateral epicondylitis 04/15/2017  . Piriformis syndrome, left 02/25/2017  . Tendinitis of extensor tendon of right hand 06/11/2016  . Mixed stress and urge urinary incontinence 02/07/2016  . Carpal tunnel syndrome 01/03/2016  . Polyarthralgia 01/03/2016  . Galactocele associated with childbirth 11/07/2015  . Pregnancy with poor obstetric history 02/21/2015  . Multigravida of advanced maternal age 05/21/2015  . Radial tunnel syndrome 06/17/2014  . Cervical radiculopathy 06/10/2014  . Bilateral low back pain with right-sided sciatica 06/10/2014  . Bipolar depression ([place]) 05/17/2014  . Chronic night sweats 06/26/2013  . History of recurrent spontaneous abortion, not currently pregnant 01/26/2012  . Fluid retention 01/25/2012  . Obesity (BMI 30.0-34.9) 01/25/2012    Current Outpatient Medications  Medication Sig Dispense Refill  . fluticasone (FLONASE) 50 MCG/ACT nasal spray Place 2 sprays into both nostrils daily. 16 g 6  . gabapentin (NEURONTIN) 100 MG capsule Take 1 capsule (100 mg total) by mouth at bedtime. 90 capsule 3  . ibuprofen (ADVIL,MOTRIN) 800 MG tablet Take 1 tablet (800 mg total) by mouth every 8 (eight) hours as needed. 90 tablet 0  . levonorgestrel (MIRENA) 20 MCG/24HR IUD 1 each by Intrauterine route once.    . meloxicam (MOBIC) 7.5 MG tablet TAKE 1 TABLET BY MOUTH ONCE DAILY WITH FOOD  0  . Salicylic Acid 3 % SHAM Use daily as directed for dry scalp 236 mL 1  . selenium sulfide (SELSUN) 2.5 % shampoo     . tranexamic acid (LYSTEDA) 650 MG TABS tablet Take 650 mg by mouth daily.  0  . venlafaxine XR (EFFEXOR-XR) 150 MG 24 hr capsule Take 1 capsule (150 mg total) by mouth daily with breakfast. 90 capsule 3   No current facility-administered medications for this visit.     Allergies: Patient has no known allergies.   Past Medical History:  Diagnosis Date  . Anxiety   . Arthritis   . Carpal tunnel syndrome   . Depression   . Hammer toe 04/2013   bilateral 4th and 5th   . Ingrown left big toenail 04/2013  . Mental disorder   . Neuromuscular disorder ([place])   . Post term pregnancy over 40 weeks 09/21/2015  . SVD (spontaneous vaginal delivery) 09/22/2015  . UTI (lower urinary tract infection)     Past Surgical History:  Procedure Laterality Date  . BREAST SURGERY    . CORRECTION HAMMER TOE Bilateral   . CRYOABLATION  2004  . CYSTOSCOPY WITH HYDRODISTENSION AND BIOPSY  07/28/2004   with urethral dilatation  . DILATION AND CURETTAGE OF UTERUS    . DILATION AND EVACUATION  12/21/2011   Procedure: DILATATION AND EVACUATION;  Surgeon: Terrance Mass, MD;  Location: [facility];  Service: Gynecology;  Laterality: N/A;  first trimester   . HAMMER TOE SURGERY Bilateral 04/20/2013   Procedure: BILATERAL HAMMER TOE REPAIR AND EXCISION NAIL PERMANENT LEFT FIRST;  Surgeon: Harriet Masson, DPM;  Location: [facility];  Service: Podiatry;  Laterality: Bilateral;  Hammer toe repair fourth and fifth toes bilateral feet with screw placement fourth toes and partial nail excision left great toe  . HYSTEROSALPINGOGRAM    . TOE SURGERY     ingrown toenail  . WISDOM TOOTH EXTRACTION      Family History  Problem Relation Age of Onset  . Cancer  Father        lung cancer  . Birth defects Paternal Grandfather   . Seizures Mother   . Arthritis Mother   . Thyroid disease Maternal Grandmother   . Heart disease Paternal Grandmother        2 CABG    Social History   Tobacco Use  . Smoking status: Never Smoker  . Smokeless tobacco: Never Used  Substance Use Topics  . Alcohol use: No    Comment: occasionally    Subjective:  Patient presents with concerns for inability to lose weight; working with trainer 2-3 x per week; feels like she is eating well- 1200-1500 calories per day; has  chronic "inflamed nerves" that affects her ability to exercise; was given Phentermine in 2018 but did not offer any benefit;   IUD in place for about one year;  Pregnancy in September 2017- started around 140- gained 20 pounds with pregnancy; has just continued to gain weight;   Objective:  Vitals:   09/30/17 1028  BP: 114/78  Pulse: 67  Temp: 98 F (36.7 C)  TempSrc: Oral  SpO2: 97%  Weight: 179 lb 0.6 oz (81.2 kg)  Height: 5' 1"  (1.549 m)    General: Well developed, well nourished, in no acute distress  Skin : Warm and dry.  Head: Normocephalic and atraumatic  Lungs: Respirations unlabored;  Neurologic: Alert and oriented; speech intact; face symmetrical; moves all extremities well; CNII-XII intact without focal deficit   Assessment:  1. Weight gain, abnormal     Plan:  Will update labs today; ? If weight issues could be related to Effexor? May need to refer to weight loss clinic.     No follow-ups on file.  Orders Placed This Encounter  Procedures  . CBC w/Diff    Standing Status:   Future    Number of Occurrences:   1    Standing Expiration Date:   09/30/2018  . Comp Met (CMET)    Standing Status:   Future    Number of Occurrences:   1    Standing Expiration Date:   09/30/2018  . HgB A1c    Standing Status:   Future    Number of Occurrences:   1    Standing Expiration Date:   09/30/2018  . TSH    Standing Status:   Future    Number of Occurrences:   1    Standing Expiration Date:   09/30/2018  . [place]    Standing Status:   Future    Number of Occurrences:   1    Standing Expiration Date:   09/30/2018  . LH    Standing Status:   Future    Number of Occurrences:   1    Standing Expiration Date:   09/30/2018  . Prolactin    Standing Status:   Future    Number of Occurrences:   1    Standing Expiration Date:   09/30/2018  . Testosterone    Standing Status:   Future    Number of Occurrences:   1    Standing Expiration Date:   09/30/2018    Requested  Prescriptions    No prescriptions requested or ordered in this encounter

## 2017-10-01 ENCOUNTER — Other Ambulatory Visit: Payer: Self-pay | Admitting: Family

## 2017-10-01 DIAGNOSIS — E669 Obesity, unspecified: Secondary | ICD-10-CM

## 2017-10-01 LAB — PROLACTIN: Prolactin: 7.6 ng/mL

## 2017-10-07 ENCOUNTER — Ambulatory Visit
Admission: RE | Admit: 2017-10-07 | Discharge: 2017-10-07 | Disposition: A | Discharge status: Home / Self Care | Payer: Medicare Other | Source: Ambulatory Visit | Attending: Family Medicine | Admitting: Family Medicine

## 2017-10-07 ENCOUNTER — Ambulatory Visit: Payer: Self-pay | Admitting: Family Medicine

## 2017-10-07 DIAGNOSIS — M5412 Radiculopathy, cervical region: Secondary | ICD-10-CM

## 2017-10-07 MED ORDER — TRIAMCINOLONE ACETONIDE 40 MG/ML IJ SUSP (RADIOLOGY)
60.0000 mg | Freq: Once | INTRAMUSCULAR | Status: AC
  Administered 2017-10-07: 60 mg via EPIDURAL

## 2017-10-07 MED ORDER — IOPAMIDOL (ISOVUE-M 300) INJECTION 61%
1.0000 mL | Freq: Once | INTRAMUSCULAR | Status: AC
  Administered 2017-10-07: 1 mL via EPIDURAL

## 2017-10-07 NOTE — Discharge Instructions (Signed)

## 2017-10-11 ENCOUNTER — Telehealth: Payer: Self-pay

## 2017-10-11 NOTE — Telephone Encounter (Signed)
Copied from [place] [id number]. Topic: General - Other >> Oct 11, 2017  1:25 PM Yvette Rack wrote:  Reason for CRM: pt calling stating that the weight loss office cant get her in until 6 months or so and that she would like something called into her pharmacy today because she has tried everything she uses the [facility] ([facility] [place] [facility] [place]

## 2017-10-11 NOTE — Telephone Encounter (Signed)
I don't routinely prescribe weight loss medications; my experience has been that they are able to get patients in sooner than 6 months at the weight loss clinic.

## 2017-10-11 NOTE — Telephone Encounter (Signed)
Message sent to patient via my-chart with info.

## 2017-10-13 NOTE — Progress Notes (Signed)
[name] [place], [place] [address] Phone: [phone] Subjective:    I Melissa [name] am serving as a Education administrator for Dr. Hulan Saas.  CC: neck pain   ONG:[id number]  Melissa [name] is a 42 y.o. female coming in with complaint of neck pain. Epidural worked but she feels she may need another. Wants to talk about treatment plan.  Patient states that he did have improvement after the epidural but is worsening pain again.  More radiation again.  Patient's MRI did show some mild disc protrusion but no nerve impingement.     Past Medical History:  Diagnosis Date  . Anxiety   . Arthritis   . Carpal tunnel syndrome   . Depression   . Hammer toe 04/2013   bilateral 4th and 5th   . Ingrown left big toenail 04/2013  . Mental disorder   . Neuromuscular disorder ([place])   . Post term pregnancy over 40 weeks 09/21/2015  . SVD (spontaneous vaginal delivery) 09/22/2015  . UTI (lower urinary tract infection)    Past Surgical History:  Procedure Laterality Date  . BREAST SURGERY    . CORRECTION HAMMER TOE Bilateral   . CRYOABLATION  2004  . CYSTOSCOPY WITH HYDRODISTENSION AND BIOPSY  07/28/2004   with urethral dilatation  . DILATION AND CURETTAGE OF UTERUS    . DILATION AND EVACUATION  12/21/2011   Procedure: DILATATION AND EVACUATION;  Surgeon: Terrance Mass, MD;  Location: [facility];  Service: Gynecology;  Laterality: N/A;  first trimester   . HAMMER TOE SURGERY Bilateral 04/20/2013   Procedure: BILATERAL HAMMER TOE REPAIR AND EXCISION NAIL PERMANENT LEFT FIRST;  Surgeon: Harriet Masson, DPM;  Location: [facility];  Service: Podiatry;  Laterality: Bilateral;  Hammer toe repair fourth and fifth toes bilateral feet with screw placement fourth toes and partial nail excision left great toe  . HYSTEROSALPINGOGRAM    . TOE SURGERY     ingrown toenail  . WISDOM TOOTH EXTRACTION     Social History   Socioeconomic  History  . Marital status: Single    Spouse name: Not on file  . Number of children: Not on file  . Years of education: Not on file  . Highest education level: Not on file  Occupational History  . Not on file  Social Needs  . Financial resource strain: Not on file  . Food insecurity:    Worry: Not on file    Inability: Not on file  . Transportation needs:    Medical: Not on file    Non-medical: Not on file  Tobacco Use  . Smoking status: Never Smoker  . Smokeless tobacco: Never Used  Substance and Sexual Activity  . Alcohol use: No    Comment: occasionally  . Drug use: No  . Sexual activity: Yes    Birth control/protection: IUD  Lifestyle  . Physical activity:    Days per week: Not on file    Minutes per session: Not on file  . Stress: Not on file  Relationships  . Social connections:    Talks on phone: Not on file    Gets together: Not on file    Attends religious service: Not on file    Active member of club or organization: Not on file    Attends meetings of clubs or organizations: Not on file    Relationship status: Not on file  Other Topics Concern  . Not on  file  Social History Narrative  . Not on file   No Known Allergies Family History  Problem Relation Age of Onset  . Cancer Father        lung cancer  . Birth defects Paternal Grandfather   . Seizures Mother   . Arthritis Mother   . Thyroid disease Maternal Grandmother   . Heart disease Paternal Grandmother        2 CABG    Current Outpatient Medications (Endocrine & Metabolic):  .  levonorgestrel (MIRENA) 20 MCG/24HR IUD, 1 each by Intrauterine route once. .  methylPREDNISolone (MEDROL) 4 MG tablet, 3 pills 3 days, 2 pills 3 days, 1 pill 3 days, 1/2 pills 3 days   Current Outpatient Medications (Respiratory):  .  fluticasone (FLONASE) 50 MCG/ACT nasal spray, Place 2 sprays into both nostrils daily.  Current Outpatient Medications (Analgesics):  .  ibuprofen (ADVIL,MOTRIN) 800 MG tablet, Take 1  tablet (800 mg total) by mouth every 8 (eight) hours as needed. .  meloxicam (MOBIC) 7.5 MG tablet, TAKE 1 TABLET BY MOUTH ONCE DAILY WITH FOOD  Current Outpatient Medications (Hematological):  .  tranexamic acid (LYSTEDA) 650 MG TABS tablet, Take 650 mg by mouth daily.  Current Outpatient Medications (Other):  .  gabapentin (NEURONTIN) 100 MG capsule, Take 1 capsule (100 mg total) by mouth at bedtime. .  Salicylic Acid 3 % SHAM, Use daily as directed for dry scalp .  selenium sulfide (SELSUN) 2.5 % shampoo,  .  venlafaxine XR (EFFEXOR-XR) 150 MG 24 hr capsule, Take 1 capsule (150 mg total) by mouth daily with breakfast.    Past medical history, social, surgical and family history all reviewed in electronic medical record.  No pertanent information unless stated regarding to the chief complaint.   Review of Systems:  No headache, visual changes, nausea, vomiting, diarrhea, constipation, dizziness, abdominal pain, skin rash, fevers, chills, night sweats, weight loss, swollen lymph nodes, body aches, joint swelling,chest pain, shortness of breath, mood changes.  Positive muscle aches  Objective  Blood pressure 120/78, pulse 80, height 5\' 1"  (1.549 m), weight 177 lb (80.3 kg), last menstrual period 10/06/2017, SpO2 97 %, unknown if currently breastfeeding.   General: No apparent distress alert and oriented x3 mood and affect normal, dressed appropriately.  HEENT: Pupils equal, extraocular movements intact  Respiratory: Patient's speak in full sentences and does not appear short of breath  Cardiovascular: No lower extremity edema, non tender, no erythema  Skin: Warm dry intact with no signs of infection or rash on extremities or on axial skeleton.  Abdomen: Soft nontender  Neuro: Cranial nerves II through XII are intact, neurovascularly intact in all extremities with 2+ DTRs and 2+ pulses.  Lymph: No lymphadenopathy of posterior or anterior cervical chain or axillae bilaterally.  Gait  normal with good balance and coordination.  MSK:  tender with full range of motion and good stability and symmetric strength and tone of shoulders, elbows, wrist, hip, knee and ankles bilaterally.  Tenderness in proportion to the amount of palpation. Neck: Inspection unremarkable. No palpable stepoffs. Positive Spurling's maneuver. Mild limited range of motion in all planes especially extension of 10 degrees Grip strength and sensation normal in bilateral hands Strength good C4 to T1 distribution No sensory change to C4 to T1 Negative Hoffman sign bilaterally Reflexes normal Tightness of the trapezius bilaterally    Impression and Recommendations:     This case required medical decision making of moderate complexity. The above documentation has been reviewed and  is accurate and complete Lyndal Pulley, DO       Note: This dictation was prepared with Dragon dictation along with smaller phrase technology. Any transcriptional errors that result from this process are unintentional.

## 2017-10-14 ENCOUNTER — Ambulatory Visit (INDEPENDENT_AMBULATORY_CARE_PROVIDER_SITE_OTHER): Payer: Medicare Other | Admitting: Family Medicine

## 2017-10-14 ENCOUNTER — Encounter: Payer: Self-pay | Admitting: Family Medicine

## 2017-10-14 VITALS — BP 120/78 | HR 80 | Ht 61.0 in | Wt 177.0 lb

## 2017-10-14 DIAGNOSIS — M5412 Radiculopathy, cervical region: Secondary | ICD-10-CM | POA: Diagnosis not present

## 2017-10-14 DIAGNOSIS — M542 Cervicalgia: Secondary | ICD-10-CM

## 2017-10-14 MED ORDER — METHYLPREDNISOLONE 4 MG PO TABS: ORAL_TABLET | ORAL | 0 refills | Status: DC

## 2017-10-14 NOTE — Patient Instructions (Addendum)
Good to see you  Ice is your friend  Stay active We orded another epidural  If not btter can do another epidurla in the neck.  Ice is your friend.  Refilled the medrol  See me again in 3-4 weeks after the injection

## 2017-10-14 NOTE — Assessment & Plan Note (Signed)
Continues mild radicular symptoms.  Likely no significant weakness.  Did have some response of the first epidural.  Patient's MRI though was fairly unremarkable.  Patient's was encouraged though by some of the improvement with the injection would like to have it again.  We discussed that we can try that.  Otherwise if patient does not have good relief of pain and symptoms I would consider treating patient for more of a f fibromyalgia.

## 2017-10-14 NOTE — Telephone Encounter (Signed)
Patient has called the weight loss clinic and cannot get in for 3 months, she would like to know if laura will prescribe the weight loss medication or can we try another weight loss clinic, the [facility] weight loss clinic. Please advise

## 2017-10-15 ENCOUNTER — Telehealth: Payer: Self-pay | Admitting: Podiatry

## 2017-10-15 ENCOUNTER — Other Ambulatory Visit: Payer: Self-pay | Admitting: Podiatry

## 2017-10-15 MED ORDER — HYDROCODONE-ACETAMINOPHEN 10-325 MG PO TABS: 1.0000 | ORAL_TABLET | Freq: Four times a day (QID) | ORAL | 0 refills | Status: AC | PRN

## 2017-10-15 MED ORDER — CEPHALEXIN 500 MG PO CAPS: 500.0000 mg | ORAL_CAPSULE | Freq: Three times a day (TID) | ORAL | 0 refills | Status: DC

## 2017-10-15 MED ORDER — ONDANSETRON HCL 4 MG PO TABS: 4.0000 mg | ORAL_TABLET | Freq: Three times a day (TID) | ORAL | 0 refills | Status: DC | PRN

## 2017-10-15 NOTE — Telephone Encounter (Signed)
Left msg for pt to call back. I wanted to verify the weight loss clinic she mentioned

## 2017-10-15 NOTE — Telephone Encounter (Signed)
I'm scheduled for surgery on Friday and I wanted to know if I need to take my toenail polish off? If you can please call me back at [phone].

## 2017-10-15 NOTE — Telephone Encounter (Signed)
I am returning your call.  Yes, you do need to remove your nail polish.  "Okay, I will."

## 2017-10-18 ENCOUNTER — Encounter: Payer: Self-pay | Admitting: Podiatry

## 2017-10-18 DIAGNOSIS — D2372 Other benign neoplasm of skin of left lower limb, including hip: Secondary | ICD-10-CM

## 2017-10-18 DIAGNOSIS — M25774 Osteophyte, right foot: Secondary | ICD-10-CM | POA: Diagnosis not present

## 2017-10-24 ENCOUNTER — Ambulatory Visit (INDEPENDENT_AMBULATORY_CARE_PROVIDER_SITE_OTHER): Payer: Medicare Other

## 2017-10-24 ENCOUNTER — Ambulatory Visit (INDEPENDENT_AMBULATORY_CARE_PROVIDER_SITE_OTHER): Payer: Medicare Other | Admitting: Podiatry

## 2017-10-24 DIAGNOSIS — M2041 Other hammer toe(s) (acquired), right foot: Secondary | ICD-10-CM | POA: Diagnosis not present

## 2017-10-24 DIAGNOSIS — M2042 Other hammer toe(s) (acquired), left foot: Principal | ICD-10-CM

## 2017-10-25 NOTE — Progress Notes (Signed)
Subjective:   Patient ID: Melissa [name], female   DOB: 42 y.o.   MRN: [id number]   HPI Patient presents 6 days after having foot surgery right foot and left foot stating she is doing well   ROS      Objective:  Physical Exam  Neurovascular status was found to be intact negative Homans sign noted with well-healed surgical site right fifth digit left first metatarsal with wound edges well coapted digit is intact and no drainage     Assessment:  Patient is responding well post surgical intervention     Plan:  Evaluated incision sites advised on continued elevation compression and sterile dressings reapplied and reappoint for suture removal or earlier if any issues should occur

## 2017-10-28 ENCOUNTER — Ambulatory Visit
Admission: RE | Admit: 2017-10-28 | Discharge: 2017-10-28 | Disposition: A | Discharge status: Home / Self Care | Payer: Medicare Other | Source: Ambulatory Visit | Attending: Family Medicine | Admitting: Family Medicine

## 2017-10-28 DIAGNOSIS — M542 Cervicalgia: Secondary | ICD-10-CM

## 2017-10-28 MED ORDER — IOPAMIDOL (ISOVUE-M 300) INJECTION 61%
1.0000 mL | Freq: Once | INTRAMUSCULAR | Status: AC | PRN
  Administered 2017-10-28: 1 mL via EPIDURAL

## 2017-10-28 MED ORDER — TRIAMCINOLONE ACETONIDE 40 MG/ML IJ SUSP (RADIOLOGY)
60.0000 mg | Freq: Once | INTRAMUSCULAR | Status: AC
  Administered 2017-10-28: 60 mg via EPIDURAL

## 2017-10-31 ENCOUNTER — Ambulatory Visit (INDEPENDENT_AMBULATORY_CARE_PROVIDER_SITE_OTHER): Payer: Medicare Other | Admitting: Podiatry

## 2017-10-31 DIAGNOSIS — M2041 Other hammer toe(s) (acquired), right foot: Secondary | ICD-10-CM

## 2017-10-31 DIAGNOSIS — M2042 Other hammer toe(s) (acquired), left foot: Secondary | ICD-10-CM

## 2017-10-31 DIAGNOSIS — M2012 Hallux valgus (acquired), left foot: Secondary | ICD-10-CM

## 2017-10-31 NOTE — Progress Notes (Signed)
She presents today for follow-up of her surgery.  Date of surgery 10/18/2017 status post exostectomy third digit right foot excision painful scar first metatarsal left foot.  She denies fever chills nausea vomiting muscle aches and pains states that she seems to be doing pretty well.  Objective: No erythema edema cellulitis drainage or odor sutures are intact margins appear to be coapted however there are some areas that do not appear to be as well as coapted as I would like them to be.  There is no purulence no malodor no signs of infection.  Assessment: Well-healing surgical foot bilateral.  Plan: At this point I would leave the sutures in which we will remove next week.

## 2017-11-05 ENCOUNTER — Ambulatory Visit: Payer: Self-pay | Admitting: Family Medicine

## 2017-11-07 ENCOUNTER — Ambulatory Visit (INDEPENDENT_AMBULATORY_CARE_PROVIDER_SITE_OTHER): Payer: Self-pay | Admitting: Podiatry

## 2017-11-07 DIAGNOSIS — M2041 Other hammer toe(s) (acquired), right foot: Secondary | ICD-10-CM

## 2017-11-07 DIAGNOSIS — M2042 Other hammer toe(s) (acquired), left foot: Secondary | ICD-10-CM

## 2017-11-07 NOTE — Progress Notes (Signed)
She presents today for her second postop visit date of surgery 10/18/2017 status post exostectomy third toe right foot and excision painful scar left foot.  She denies fever chills nausea vomiting muscle aches and pains that she is doing great.  Objective: Vital signs are stable alert and oriented x3.  There is no erythema edema saline strange odor margins appear to be well coapted sutures were removed today margins remain well coapted.  Assessment: Well-healing surgical foot bilaterally.  Plan: Follow-up with me in 1 month.

## 2017-11-14 ENCOUNTER — Encounter: Payer: Self-pay | Admitting: Podiatry

## 2017-11-14 ENCOUNTER — Other Ambulatory Visit: Payer: Medicare Other

## 2017-11-17 NOTE — Progress Notes (Signed)
[name] [address], [place] [address] Phone: [phone] Subjective:   [name], am serving as a scribe for Dr. Hulan Saas.  CC: Neck and back pain  XBD:[id number]  Melissa [name] is a 42 y.o. female coming in with complaint of cervical and thoracic spine pain on the left. Does feel like she needs an epidural. States that she had her kids jump on her back because she was in a lot of pain.  Patient feels like she continues to have pain.  Not been as active.  Gaining weight.  Feels like some other things are going on.  Patient did get better with the epidural and the neck pain seems to be improved but continues to have the radicular symptoms down the arm.  Patient does not know how much more aggressive she would like to get at this time.  Feels like it is mostly tolerable at the moment.  More pain around the scapular regions bilaterally.      Past Medical History:  Diagnosis Date  . Anxiety   . Arthritis   . Carpal tunnel syndrome   . Depression   . Hammer toe 04/2013   bilateral 4th and 5th   . Ingrown left big toenail 04/2013  . Mental disorder   . Neuromuscular disorder ([place])   . Post term pregnancy over 40 weeks 09/21/2015  . SVD (spontaneous vaginal delivery) 09/22/2015  . UTI (lower urinary tract infection)    Past Surgical History:  Procedure Laterality Date  . BREAST SURGERY    . CORRECTION HAMMER TOE Bilateral   . CRYOABLATION  2004  . CYSTOSCOPY WITH HYDRODISTENSION AND BIOPSY  07/28/2004   with urethral dilatation  . DILATION AND CURETTAGE OF UTERUS    . DILATION AND EVACUATION  12/21/2011   Procedure: DILATATION AND EVACUATION;  Surgeon: Terrance Mass, MD;  Location: [facility];  Service: Gynecology;  Laterality: N/A;  first trimester   . HAMMER TOE SURGERY Bilateral 04/20/2013   Procedure: BILATERAL HAMMER TOE REPAIR AND EXCISION NAIL PERMANENT LEFT FIRST;  Surgeon: Harriet Masson, DPM;   Location: [facility];  Service: Podiatry;  Laterality: Bilateral;  Hammer toe repair fourth and fifth toes bilateral feet with screw placement fourth toes and partial nail excision left great toe  . HYSTEROSALPINGOGRAM    . TOE SURGERY     ingrown toenail  . WISDOM TOOTH EXTRACTION     Social History   Socioeconomic History  . Marital status: Single    Spouse name: Not on file  . Number of children: Not on file  . Years of education: Not on file  . Highest education level: Not on file  Occupational History  . Not on file  Social Needs  . Financial resource strain: Not on file  . Food insecurity:    Worry: Not on file    Inability: Not on file  . Transportation needs:    Medical: Not on file    Non-medical: Not on file  Tobacco Use  . Smoking status: Never Smoker  . Smokeless tobacco: Never Used  Substance and Sexual Activity  . Alcohol use: No    Comment: occasionally  . Drug use: No  . Sexual activity: Yes    Birth control/protection: IUD  Lifestyle  . Physical activity:    Days per week: Not on file    Minutes per session: Not on file  . Stress: Not on file  Relationships  . Social connections:    Talks on phone: Not on file    Gets together: Not on file    Attends religious service: Not on file    Active member of club or organization: Not on file    Attends meetings of clubs or organizations: Not on file    Relationship status: Not on file  Other Topics Concern  . Not on file  Social History Narrative  . Not on file   No Known Allergies Family History  Problem Relation Age of Onset  . Cancer Father        lung cancer  . Birth defects Paternal Grandfather   . Seizures Mother   . Arthritis Mother   . Thyroid disease Maternal Grandmother   . Heart disease Paternal Grandmother        2 CABG    Current Outpatient Medications (Endocrine & Metabolic):  .  levonorgestrel (MIRENA) 20 MCG/24HR IUD, 1 each by Intrauterine route once. .   methylPREDNISolone (MEDROL) 4 MG tablet, 3 pills 3 days, 2 pills 3 days, 1 pill 3 days, 1/2 pills 3 days   Current Outpatient Medications (Respiratory):  .  fluticasone (FLONASE) 50 MCG/ACT nasal spray, Place 2 sprays into both nostrils daily.  Current Outpatient Medications (Analgesics):  .  ibuprofen (ADVIL,MOTRIN) 800 MG tablet, Take 1 tablet (800 mg total) by mouth every 8 (eight) hours as needed. .  meloxicam (MOBIC) 7.5 MG tablet, TAKE 1 TABLET BY MOUTH ONCE DAILY WITH FOOD  Current Outpatient Medications (Hematological):  .  tranexamic acid (LYSTEDA) 650 MG TABS tablet, Take 650 mg by mouth daily.  Current Outpatient Medications (Other):  .  cephALEXin (KEFLEX) 500 MG capsule, Take 1 capsule (500 mg total) by mouth 3 (three) times daily. Marland Kitchen  gabapentin (NEURONTIN) 100 MG capsule, Take 1 capsule (100 mg total) by mouth at bedtime. .  ondansetron (ZOFRAN) 4 MG tablet, Take 1 tablet (4 mg total) by mouth every 8 (eight) hours as needed for nausea or vomiting. .  Salicylic Acid 3 % SHAM, Use daily as directed for dry scalp .  selenium sulfide (SELSUN) 2.5 % shampoo,  .  venlafaxine XR (EFFEXOR-XR) 150 MG 24 hr capsule, Take 1 capsule (150 mg total) by mouth daily with breakfast.    Past medical history, social, surgical and family history all reviewed in electronic medical record.  No pertanent information unless stated regarding to the chief complaint.   Review of Systems:  No headache, visual changes, nausea, vomiting, diarrhea, constipation, dizziness, abdominal pain, skin rash, fevers, chills, night sweats, weight loss, swollen lymph nodes, body aches, joint swelling, muscle aches, chest pain, shortness of breath, mood changes.   Objective  unknown if currently breastfeeding. Systems examined below as of    General: No apparent distress alert and oriented x3 mood and affect normal, dressed appropriately.  HEENT: Pupils equal, extraocular movements intact  Respiratory:  Patient's speak in full sentences and does not appear short of breath  Cardiovascular: No lower extremity edema, non tender, no erythema  Skin: Warm dry intact with no signs of infection or rash on extremities or on axial skeleton.  Abdomen: Soft nontender  Neuro: Cranial nerves II through XII are intact, neurovascularly intact in all extremities with 2+ DTRs and 2+ pulses.  Lymph: No lymphadenopathy of posterior or anterior cervical chain or axillae bilaterally.  Gait normal with good balance and coordination.  MSK:  Non tender with full range of motion and good stability and symmetric strength  and tone of shoulders, elbows, wrist, hip, knee and ankles bilaterally.  Patient does have an increase in kyphosis of the upper thoracic spine which is new from previous exam.  Neck exam shows some loss of lordosis.  Negative Spurling's are noted today.  Patient strength seems to be about stable from previous exam.    Impression and Recommendations:    . The above documentation has been reviewed and is accurate and complete Melissa Pulley, DO       Note: This dictation was prepared with Dragon dictation along with smaller phrase technology. Any transcriptional errors that result from this process are unintentional.

## 2017-11-18 ENCOUNTER — Other Ambulatory Visit (INDEPENDENT_AMBULATORY_CARE_PROVIDER_SITE_OTHER): Payer: Medicare Other

## 2017-11-18 ENCOUNTER — Encounter: Payer: Self-pay | Admitting: Family Medicine

## 2017-11-18 ENCOUNTER — Ambulatory Visit (INDEPENDENT_AMBULATORY_CARE_PROVIDER_SITE_OTHER): Payer: Medicare Other | Admitting: Family Medicine

## 2017-11-18 VITALS — BP 118/82 | HR 83 | Ht 61.0 in

## 2017-11-18 DIAGNOSIS — M2550 Pain in unspecified joint: Secondary | ICD-10-CM | POA: Diagnosis not present

## 2017-11-18 DIAGNOSIS — E669 Obesity, unspecified: Secondary | ICD-10-CM

## 2017-11-18 DIAGNOSIS — M5412 Radiculopathy, cervical region: Secondary | ICD-10-CM

## 2017-11-18 LAB — HEMOGLOBIN A1C: Hgb A1c MFr Bld: 5.6 % (ref 4.6–6.5)

## 2017-11-18 MED ORDER — HYDROXYZINE HCL 10 MG PO TABS: 10.0000 mg | ORAL_TABLET | Freq: Three times a day (TID) | ORAL | 0 refills | Status: DC | PRN

## 2017-11-18 NOTE — Patient Instructions (Signed)
Good to see you  Ice is your friend The exercises are key  Refilled the hydroxyzine  Continue the gabapentin if it helps See me again in 6 weeks

## 2017-11-18 NOTE — Assessment & Plan Note (Signed)
Patient responded somewhat to the epidural.  We discussed icing regimen and home exercises.  Discussed which ones today patient seems to have made some improvement after this epidural.  We discussed with patient about icing regimen and home exercise.  Patient will continue with conservative therapy.

## 2017-11-18 NOTE — Assessment & Plan Note (Signed)
Mild increase in lipid deposits at the kyphosis area.  A1c ordered today to further evaluate.

## 2017-11-25 ENCOUNTER — Telehealth: Payer: Self-pay | Admitting: Family Medicine

## 2017-11-25 NOTE — Telephone Encounter (Signed)
Spoke with patient. Per Dr. Tamala Julian he called in Hydroxyzine as this is the medication that he recommends her using.

## 2017-11-25 NOTE — Telephone Encounter (Signed)
Copied from [place] [id number]. Topic: Quick Communication - Rx Refill/Question >> Nov 25, 2017 11:02 AM Rayann Heman wrote: Medication: pt called and stated that she needs methylPREDNISolone (MEDROL) 4 MG tablet [[id number]] called in. Pt states that she thinks the wrong medication was called in. hydrOXYzine (ATARAX/VISTARIL) 10 MG tablet [[id number]] was originally called in. Please advise

## 2017-11-26 ENCOUNTER — Ambulatory Visit (INDEPENDENT_AMBULATORY_CARE_PROVIDER_SITE_OTHER): Payer: Medicare Other | Admitting: Podiatry

## 2017-11-26 DIAGNOSIS — M2041 Other hammer toe(s) (acquired), right foot: Secondary | ICD-10-CM

## 2017-11-26 DIAGNOSIS — R52 Pain, unspecified: Secondary | ICD-10-CM

## 2017-11-26 DIAGNOSIS — L905 Scar conditions and fibrosis of skin: Secondary | ICD-10-CM

## 2017-11-26 DIAGNOSIS — M2042 Other hammer toe(s) (acquired), left foot: Secondary | ICD-10-CM

## 2017-11-27 NOTE — Progress Notes (Signed)
She presents today for follow-up postop visit October 18, 2017 status post exostectomy third digit right foot and scar excision left dorsum of the foot.  She states that her third toe on the right foot is a little bit tender but all in all everything is doing much better.  She is very happy with the scar on the contralateral foot.  Objective: Vital signs are stable alert and oriented x3.  Pulses are palpable.  Neurologic sensorium is intact.  Degenerative flexors are intact edema has subsided considerably though she still has some scar tissue remaining at the surgical sites and mildly tender overlying the lateral aspect of the third toe.  This toe is still healing and that there is a wound that has gone on to heal over but is moderately still tender.  Assessment: Well-healing surgical foot.  Plan: Follow-up with me in about 2 months

## 2017-12-03 ENCOUNTER — Encounter: Payer: Medicare Other | Admitting: Podiatry

## 2017-12-30 ENCOUNTER — Ambulatory Visit: Payer: Medicare Other | Admitting: Family Medicine

## 2018-01-01 HISTORY — PX: REDUCTION MAMMAPLASTY: SUR839

## 2018-01-13 ENCOUNTER — Encounter: Payer: Self-pay | Admitting: Family

## 2018-01-13 ENCOUNTER — Ambulatory Visit (INDEPENDENT_AMBULATORY_CARE_PROVIDER_SITE_OTHER): Payer: Medicare Other | Admitting: Family

## 2018-01-13 ENCOUNTER — Other Ambulatory Visit (INDEPENDENT_AMBULATORY_CARE_PROVIDER_SITE_OTHER): Payer: Medicare Other

## 2018-01-13 VITALS — BP 126/78 | HR 80 | Temp 98.2°F | Ht 61.0 in | Wt 188.0 lb

## 2018-01-13 DIAGNOSIS — R635 Abnormal weight gain: Secondary | ICD-10-CM

## 2018-01-13 LAB — HEMOGLOBIN A1C: Hgb A1c MFr Bld: 5.6 % (ref 4.6–6.5)

## 2018-01-13 LAB — CBC WITH DIFFERENTIAL/PLATELET
Basophils Absolute: 0.1 10*3/uL (ref 0.0–0.1)
Basophils Relative: 0.6 % (ref 0.0–3.0)
Eosinophils Absolute: 0 10*3/uL (ref 0.0–0.7)
Eosinophils Relative: 0.5 % (ref 0.0–5.0)
HCT: 41.7 % (ref 36.0–46.0)
Hemoglobin: 14.7 g/dL (ref 12.0–15.0)
Lymphocytes Relative: 19.4 % (ref 12.0–46.0)
Lymphs Abs: 1.7 10*3/uL (ref 0.7–4.0)
MCHC: 35.2 g/dL (ref 30.0–36.0)
MCV: 90 fl (ref 78.0–100.0)
Monocytes Absolute: 0.6 10*3/uL (ref 0.1–1.0)
Monocytes Relative: 6.7 % (ref 3.0–12.0)
Neutro Abs: 6.4 10*3/uL (ref 1.4–7.7)
Neutrophils Relative %: 72.8 % (ref 43.0–77.0)
Platelets: 278 10*3/uL (ref 150.0–400.0)
RBC: 4.63 Mil/uL (ref 3.87–5.11)
RDW: 12.8 % (ref 11.5–15.5)
WBC: 8.8 10*3/uL (ref 4.0–10.5)

## 2018-01-13 LAB — VITAMIN D 25 HYDROXY (VIT D DEFICIENCY, FRACTURES): VITD: 17.33 ng/mL — ABNORMAL LOW (ref 30.00–100.00)

## 2018-01-13 LAB — VITAMIN B12: Vitamin B-12: 397 pg/mL (ref 211–911)

## 2018-01-13 NOTE — Progress Notes (Signed)
Melissa [name] is a 43 y.o. female with the following history as recorded in EpicCare:  Patient Active Problem List   Diagnosis Date Noted  . Right lateral epicondylitis 04/15/2017  . Piriformis syndrome, left 02/25/2017  . Tendinitis of extensor tendon of right hand 06/11/2016  . Mixed stress and urge urinary incontinence 02/07/2016  . Carpal tunnel syndrome 01/03/2016  . Polyarthralgia 01/03/2016  . Galactocele associated with childbirth 11/07/2015  . Pregnancy with poor obstetric history 02/21/2015  . Multigravida of advanced maternal age 77/20/2017  . Radial tunnel syndrome 06/17/2014  . Cervical radiculopathy 06/10/2014  . Bilateral low back pain with right-sided sciatica 06/10/2014  . Bipolar depression ([place]) 05/17/2014  . Chronic night sweats 06/26/2013  . History of recurrent spontaneous abortion, not currently pregnant 01/26/2012  . Fluid retention 01/25/2012  . Obesity (BMI 30.0-34.9) 01/25/2012    Current Outpatient Medications  Medication Sig Dispense Refill  . amphetamine-dextroamphetamine (ADDERALL) 20 MG tablet TAKE 1 TABLET BY MOUTH IN THE MORNING AND 1 EVERYDAY AT NOON AND 1 TABLET AT 4PM  0  . buPROPion (WELLBUTRIN XL) 150 MG 24 hr tablet TAKE 1 TABLET BY MOUTH ONCE A DAY FOR 7 DAYS THEN 2 TABLETS IN EVERDAY FOR 7 DAYS THEN 3 TABLETS BY MOUTH EVERYDAY THEREAFTER  12  . clobetasol (TEMOVATE) 0.05 % external solution APPLY SOLUTION TOPICALLY TO AFFECTED AREA UP TO TWICE DAILY AS NEEDED (NOT TO FACE GROIN AND AXILLA)  3  . escitalopram (LEXAPRO) 20 MG tablet Take 20 mg by mouth every morning.  12  . fluticasone (CUTIVATE) 0.05 % cream fluticasone propionate 0.05 % topical cream    . fluticasone (FLONASE) 50 MCG/ACT nasal spray Place 2 sprays into both nostrils daily. 16 g 6  . gabapentin (NEURONTIN) 100 MG capsule Take 1 capsule (100 mg total) by mouth at bedtime. 90 capsule 3  . hydrOXYzine (ATARAX/VISTARIL) 10 MG tablet Take 1 tablet (10 mg total) by mouth 3  (three) times daily as needed for anxiety. 90 tablet 0  . ibuprofen (ADVIL,MOTRIN) 800 MG tablet Take 1 tablet (800 mg total) by mouth every 8 (eight) hours as needed. 90 tablet 0  . levonorgestrel (MIRENA) 20 MCG/24HR IUD 1 each by Intrauterine route once.    . meloxicam (MOBIC) 7.5 MG tablet TAKE 1 TABLET BY MOUTH ONCE DAILY WITH FOOD  0  . methylPREDNISolone (MEDROL) 4 MG tablet 3 pills 3 days, 2 pills 3 days, 1 pill 3 days, 1/2 pills 3 days 20 tablet 0  . ondansetron (ZOFRAN) 4 MG tablet Take 1 tablet (4 mg total) by mouth every 8 (eight) hours as needed for nausea or vomiting. 20 tablet 0  . Salicylic Acid 3 % SHAM Use daily as directed for dry scalp 236 mL 1  . selenium sulfide (SELSUN) 2.5 % shampoo     . tranexamic acid (LYSTEDA) 650 MG TABS tablet Take 650 mg by mouth daily.  0  . venlafaxine XR (EFFEXOR-XR) 150 MG 24 hr capsule Take 1 capsule (150 mg total) by mouth daily with breakfast. 90 capsule 3   No current facility-administered medications for this visit.     Allergies: Patient has no known allergies.  Past Medical History:  Diagnosis Date  . Anxiety   . Arthritis   . Carpal tunnel syndrome   . Depression   . Hammer toe 04/2013   bilateral 4th and 5th   . Ingrown left big toenail 04/2013  . Mental disorder   . Neuromuscular disorder ([place])   .  Post term pregnancy over 40 weeks 09/21/2015  . SVD (spontaneous vaginal delivery) 09/22/2015  . UTI (lower urinary tract infection)     Past Surgical History:  Procedure Laterality Date  . BREAST SURGERY    . CORRECTION HAMMER TOE Bilateral   . CRYOABLATION  2004  . CYSTOSCOPY WITH HYDRODISTENSION AND BIOPSY  07/28/2004   with urethral dilatation  . DILATION AND CURETTAGE OF UTERUS    . DILATION AND EVACUATION  12/21/2011   Procedure: DILATATION AND EVACUATION;  Surgeon: Terrance Mass, MD;  Location: [facility];  Service: Gynecology;  Laterality: N/A;  first trimester   . HAMMER TOE SURGERY Bilateral  04/20/2013   Procedure: BILATERAL HAMMER TOE REPAIR AND EXCISION NAIL PERMANENT LEFT FIRST;  Surgeon: Harriet Masson, DPM;  Location: [facility];  Service: Podiatry;  Laterality: Bilateral;  Hammer toe repair fourth and fifth toes bilateral feet with screw placement fourth toes and partial nail excision left great toe  . HYSTEROSALPINGOGRAM    . TOE SURGERY     ingrown toenail  . WISDOM TOOTH EXTRACTION      Family History  Problem Relation Age of Onset  . Cancer Father        lung cancer  . Birth defects Paternal Grandfather   . Seizures Mother   . Arthritis Mother   . Thyroid disease Maternal Grandmother   . Heart disease Paternal Grandmother        2 CABG    Social History   Tobacco Use  . Smoking status: Never Smoker  . Smokeless tobacco: Never Used  Substance Use Topics  . Alcohol use: No    Comment: occasionally    Subjective:  Patient presents requesting "check up"- is concerned about continued weight gain/ is concerned that she is pregnant even though IUD is in place; having hot flashes and symptoms of pregnancy; requesting to get all of her labs checked;  These are similar symptoms she was mentioning when she was here in September- labs at that time were normal;  Weight is up 11 pounds since last OV in October- recently recovering from foot surgery; notes she exercises 3 x per week/ feels like her diet is good;  Patient does not have current medications with her- unsure which medications her psychiatrist is giving her Bipolar treatment;  LMP-  2 months ago/ IUD in place  Objective:  Vitals:   01/13/18 1106  BP: 126/78  Pulse: 80  Temp: 98.2 F (36.8 C)  TempSrc: Oral  SpO2: 98%  Weight: 188 lb (85.3 kg)  Height: 5\' 1"  (1.549 m)    General: Well developed, well nourished, in no acute distress  Skin : Warm and dry.  Head: Normocephalic and atraumatic  Lungs: Respirations unlabored; clear to auscultation bilaterally without wheeze, rales, rhonchi   CVS exam: normal rate and regular rhythm.  Neurologic: Alert and oriented; speech intact; face symmetrical; moves all extremities well; CNII-XII intact without focal deficit   Assessment:  1. Weight gain     Plan:  Suspicious that symptoms are due to combination of medications/ lifestyle; do not feel hormone levels needs to be re-checked today- they were all normal in September; patient has not followed through on referral to [facility] medical weight loss program; have asked her to call with updated medication list; will check pregnancy test per her request; follow-up to be determined.  No follow-ups on file.  Orders Placed This Encounter  Procedures  . hCG, serum, qualitative  Standing Status:   Future    Number of Occurrences:   1    Standing Expiration Date:   01/13/2019  . CBC w/Diff    Standing Status:   Future    Number of Occurrences:   1    Standing Expiration Date:   01/13/2019  . HgB A1c    Standing Status:   Future    Number of Occurrences:   1    Standing Expiration Date:   01/13/2019  . B12    Standing Status:   Future    Number of Occurrences:   1    Standing Expiration Date:   01/13/2019  . Vitamin D (25 hydroxy)    Standing Status:   Future    Number of Occurrences:   1    Standing Expiration Date:   01/13/2019    Requested Prescriptions    No prescriptions requested or ordered in this encounter

## 2018-01-14 ENCOUNTER — Other Ambulatory Visit: Payer: Self-pay | Admitting: Family

## 2018-01-14 ENCOUNTER — Encounter: Payer: Self-pay | Admitting: Family Medicine

## 2018-01-14 LAB — HCG, SERUM, QUALITATIVE: Preg, Serum: NEGATIVE

## 2018-01-14 MED ORDER — VITAMIN D (ERGOCALCIFEROL) 1.25 MG (50000 UNIT) PO CAPS: 50000.0000 [IU] | ORAL_CAPSULE | ORAL | 0 refills | Status: AC

## 2018-01-16 ENCOUNTER — Ambulatory Visit: Payer: Medicare Other | Admitting: Family Medicine

## 2018-01-16 ENCOUNTER — Encounter

## 2018-01-16 NOTE — Progress Notes (Deleted)
[name] [address] [place], [place] [address] Phone: [phone] Subjective:    I'm seeing this patient by the request  of:    CC:   [id number]  Melissa [name] is a 43 y.o. female coming in with complaint of ***  Onset-  Location Duration-  Character- Aggravating factors- Reliving factors-  Therapies tried-  Severity-     Past Medical History:  Diagnosis Date  . Anxiety   . Arthritis   . Carpal tunnel syndrome   . Depression   . Hammer toe 04/2013   bilateral 4th and 5th   . Ingrown left big toenail 04/2013  . Mental disorder   . Neuromuscular disorder ([place])   . Post term pregnancy over 40 weeks 09/21/2015  . SVD (spontaneous vaginal delivery) 09/22/2015  . UTI (lower urinary tract infection)    Past Surgical History:  Procedure Laterality Date  . BREAST SURGERY    . CORRECTION HAMMER TOE Bilateral   . CRYOABLATION  2004  . CYSTOSCOPY WITH HYDRODISTENSION AND BIOPSY  07/28/2004   with urethral dilatation  . DILATION AND CURETTAGE OF UTERUS    . DILATION AND EVACUATION  12/21/2011   Procedure: DILATATION AND EVACUATION;  Surgeon: Terrance Mass, MD;  Location: [facility];  Service: Gynecology;  Laterality: N/A;  first trimester   . HAMMER TOE SURGERY Bilateral 04/20/2013   Procedure: BILATERAL HAMMER TOE REPAIR AND EXCISION NAIL PERMANENT LEFT FIRST;  Surgeon: Harriet Masson, DPM;  Location: [facility];  Service: Podiatry;  Laterality: Bilateral;  Hammer toe repair fourth and fifth toes bilateral feet with screw placement fourth toes and partial nail excision left great toe  . HYSTEROSALPINGOGRAM    . TOE SURGERY     ingrown toenail  . WISDOM TOOTH EXTRACTION     Social History   Socioeconomic History  . Marital status: Single    Spouse name: Not on file  . Number of children: Not on file  . Years of education: Not on file  . Highest education level: Not on file  Occupational History    . Not on file  Social Needs  . Financial resource strain: Not on file  . Food insecurity:    Worry: Not on file    Inability: Not on file  . Transportation needs:    Medical: Not on file    Non-medical: Not on file  Tobacco Use  . Smoking status: Never Smoker  . Smokeless tobacco: Never Used  Substance and Sexual Activity  . Alcohol use: No    Comment: occasionally  . Drug use: No  . Sexual activity: Yes    Birth control/protection: I.U.D.  Lifestyle  . Physical activity:    Days per week: Not on file    Minutes per session: Not on file  . Stress: Not on file  Relationships  . Social connections:    Talks on phone: Not on file    Gets together: Not on file    Attends religious service: Not on file    Active member of club or organization: Not on file    Attends meetings of clubs or organizations: Not on file    Relationship status: Not on file  Other Topics Concern  . Not on file  Social History Narrative  . Not on file   No Known Allergies Family History  Problem Relation Age of Onset  . Cancer Father        lung  cancer  . Birth defects Paternal Grandfather   . Seizures Mother   . Arthritis Mother   . Thyroid disease Maternal Grandmother   . Heart disease Paternal Grandmother        2 CABG    Current Outpatient Medications (Endocrine & Metabolic):  .  levonorgestrel (MIRENA) 20 MCG/24HR IUD, 1 each by Intrauterine route once. .  methylPREDNISolone (MEDROL) 4 MG tablet, 3 pills 3 days, 2 pills 3 days, 1 pill 3 days, 1/2 pills 3 days   Current Outpatient Medications (Respiratory):  .  fluticasone (FLONASE) 50 MCG/ACT nasal spray, Place 2 sprays into both nostrils daily.  Current Outpatient Medications (Analgesics):  .  ibuprofen (ADVIL,MOTRIN) 800 MG tablet, Take 1 tablet (800 mg total) by mouth every 8 (eight) hours as needed. .  meloxicam (MOBIC) 7.5 MG tablet, TAKE 1 TABLET BY MOUTH ONCE DAILY WITH FOOD  Current Outpatient Medications (Hematological):   .  tranexamic acid (LYSTEDA) 650 MG TABS tablet, Take 650 mg by mouth daily.  Current Outpatient Medications (Other):  .  amphetamine-dextroamphetamine (ADDERALL) 20 MG tablet, TAKE 1 TABLET BY MOUTH IN THE MORNING AND 1 EVERYDAY AT NOON AND 1 TABLET AT 4PM .  buPROPion (WELLBUTRIN XL) 150 MG 24 hr tablet, TAKE 1 TABLET BY MOUTH ONCE A DAY FOR 7 DAYS THEN 2 TABLETS IN EVERDAY FOR 7 DAYS THEN 3 TABLETS BY MOUTH EVERYDAY THEREAFTER .  clobetasol (TEMOVATE) 0.05 % external solution, APPLY SOLUTION TOPICALLY TO AFFECTED AREA UP TO TWICE DAILY AS NEEDED (NOT TO FACE GROIN AND AXILLA) .  escitalopram (LEXAPRO) 20 MG tablet, Take 20 mg by mouth every morning. .  fluticasone (CUTIVATE) 0.05 % cream, fluticasone propionate 0.05 % topical cream .  gabapentin (NEURONTIN) 100 MG capsule, Take 1 capsule (100 mg total) by mouth at bedtime. .  hydrOXYzine (ATARAX/VISTARIL) 10 MG tablet, Take 1 tablet (10 mg total) by mouth 3 (three) times daily as needed for anxiety. .  ondansetron (ZOFRAN) 4 MG tablet, Take 1 tablet (4 mg total) by mouth every 8 (eight) hours as needed for nausea or vomiting. .  Salicylic Acid 3 % SHAM, Use daily as directed for dry scalp .  selenium sulfide (SELSUN) 2.5 % shampoo,  .  venlafaxine XR (EFFEXOR-XR) 150 MG 24 hr capsule, Take 1 capsule (150 mg total) by mouth daily with breakfast. .  Vitamin D, Ergocalciferol, (DRISDOL) 1.25 MG (50000 UT) CAPS capsule, Take 1 capsule (50,000 Units total) by mouth every 7 (seven) days for 12 doses.    Past medical history, social, surgical and family history all reviewed in electronic medical record.  No pertanent information unless stated regarding to the chief complaint.   Review of Systems:  No headache, visual changes, nausea, vomiting, diarrhea, constipation, dizziness, abdominal pain, skin rash, fevers, chills, night sweats, weight loss, swollen lymph nodes, body aches, joint swelling, muscle aches, chest pain, shortness of breath, mood  changes.   Objective  unknown if currently breastfeeding. Systems examined below as of    General: No apparent distress alert and oriented x3 mood and affect normal, dressed appropriately.  HEENT: Pupils equal, extraocular movements intact  Respiratory: Patient's speak in full sentences and does not appear short of breath  Cardiovascular: No lower extremity edema, non tender, no erythema  Skin: Warm dry intact with no signs of infection or rash on extremities or on axial skeleton.  Abdomen: Soft nontender  Neuro: Cranial nerves II through XII are intact, neurovascularly intact in all extremities with 2+ DTRs and  2+ pulses.  Lymph: No lymphadenopathy of posterior or anterior cervical chain or axillae bilaterally.  Gait normal with good balance and coordination.  MSK:  Non tender with full range of motion and good stability and symmetric strength and tone of shoulders, elbows, wrist, hip, knee and ankles bilaterally.     Impression and Recommendations:     This case required medical decision making of moderate complexity. The above documentation has been reviewed and is accurate and complete Lyndal Pulley, DO       Note: This dictation was prepared with Dragon dictation along with smaller phrase technology. Any transcriptional errors that result from this process are unintentional.

## 2018-01-21 ENCOUNTER — Encounter: Payer: Medicare Other | Admitting: Podiatry

## 2018-01-28 NOTE — Progress Notes (Signed)
This encounter was created in error - please disregard.

## 2018-03-03 ENCOUNTER — Ambulatory Visit: Payer: Medicare Other | Admitting: Family Medicine

## 2018-03-09 NOTE — Progress Notes (Deleted)
[name] [place], [place] [address] Phone: [phone] Subjective:    I'm seeing this patient by the request  of:    CC:   [id number]  Melissa [name] is a 43 y.o. female coming in with complaint of ***  Onset-  Location Duration-  Character- Aggravating factors- Reliving factors-  Therapies tried-  Severity-     Past Medical History:  Diagnosis Date  . Anxiety   . Arthritis   . Carpal tunnel syndrome   . Depression   . Hammer toe 04/2013   bilateral 4th and 5th   . Ingrown left big toenail 04/2013  . Mental disorder   . Neuromuscular disorder ([place])   . Post term pregnancy over 40 weeks 09/21/2015  . SVD (spontaneous vaginal delivery) 09/22/2015  . UTI (lower urinary tract infection)    Past Surgical History:  Procedure Laterality Date  . BREAST SURGERY    . CORRECTION HAMMER TOE Bilateral   . CRYOABLATION  2004  . CYSTOSCOPY WITH HYDRODISTENSION AND BIOPSY  07/28/2004   with urethral dilatation  . DILATION AND CURETTAGE OF UTERUS    . DILATION AND EVACUATION  12/21/2011   Procedure: DILATATION AND EVACUATION;  Surgeon: Terrance Mass, MD;  Location: [facility];  Service: Gynecology;  Laterality: N/A;  first trimester   . HAMMER TOE SURGERY Bilateral 04/20/2013   Procedure: BILATERAL HAMMER TOE REPAIR AND EXCISION NAIL PERMANENT LEFT FIRST;  Surgeon: Harriet Masson, DPM;  Location: [facility];  Service: Podiatry;  Laterality: Bilateral;  Hammer toe repair fourth and fifth toes bilateral feet with screw placement fourth toes and partial nail excision left great toe  . HYSTEROSALPINGOGRAM    . TOE SURGERY     ingrown toenail  . WISDOM TOOTH EXTRACTION     Social History   Socioeconomic History  . Marital status: Single    Spouse name: Not on file  . Number of children: Not on file  . Years of education: Not on file  . Highest education level: Not on file  Occupational History    . Not on file  Social Needs  . Financial resource strain: Not on file  . Food insecurity:    Worry: Not on file    Inability: Not on file  . Transportation needs:    Medical: Not on file    Non-medical: Not on file  Tobacco Use  . Smoking status: Never Smoker  . Smokeless tobacco: Never Used  Substance and Sexual Activity  . Alcohol use: No    Comment: occasionally  . Drug use: No  . Sexual activity: Yes    Birth control/protection: I.U.D.  Lifestyle  . Physical activity:    Days per week: Not on file    Minutes per session: Not on file  . Stress: Not on file  Relationships  . Social connections:    Talks on phone: Not on file    Gets together: Not on file    Attends religious service: Not on file    Active member of club or organization: Not on file    Attends meetings of clubs or organizations: Not on file    Relationship status: Not on file  Other Topics Concern  . Not on file  Social History Narrative  . Not on file   No Known Allergies Family History  Problem Relation Age of Onset  . Cancer Father        lung  cancer  . Birth defects Paternal Grandfather   . Seizures Mother   . Arthritis Mother   . Thyroid disease Maternal Grandmother   . Heart disease Paternal Grandmother        2 CABG    Current Outpatient Medications (Endocrine & Metabolic):  .  levonorgestrel (MIRENA) 20 MCG/24HR IUD, 1 each by Intrauterine route once. .  methylPREDNISolone (MEDROL) 4 MG tablet, 3 pills 3 days, 2 pills 3 days, 1 pill 3 days, 1/2 pills 3 days   Current Outpatient Medications (Respiratory):  .  fluticasone (FLONASE) 50 MCG/ACT nasal spray, Place 2 sprays into both nostrils daily.  Current Outpatient Medications (Analgesics):  .  ibuprofen (ADVIL,MOTRIN) 800 MG tablet, Take 1 tablet (800 mg total) by mouth every 8 (eight) hours as needed. .  meloxicam (MOBIC) 7.5 MG tablet, TAKE 1 TABLET BY MOUTH ONCE DAILY WITH FOOD  Current Outpatient Medications (Hematological):   .  tranexamic acid (LYSTEDA) 650 MG TABS tablet, Take 650 mg by mouth daily.  Current Outpatient Medications (Other):  .  amphetamine-dextroamphetamine (ADDERALL) 20 MG tablet, TAKE 1 TABLET BY MOUTH IN THE MORNING AND 1 EVERYDAY AT NOON AND 1 TABLET AT 4PM .  buPROPion (WELLBUTRIN XL) 150 MG 24 hr tablet, TAKE 1 TABLET BY MOUTH ONCE A DAY FOR 7 DAYS THEN 2 TABLETS IN EVERDAY FOR 7 DAYS THEN 3 TABLETS BY MOUTH EVERYDAY THEREAFTER .  clobetasol (TEMOVATE) 0.05 % external solution, APPLY SOLUTION TOPICALLY TO AFFECTED AREA UP TO TWICE DAILY AS NEEDED (NOT TO FACE GROIN AND AXILLA) .  escitalopram (LEXAPRO) 20 MG tablet, Take 20 mg by mouth every morning. .  fluticasone (CUTIVATE) 0.05 % cream, fluticasone propionate 0.05 % topical cream .  gabapentin (NEURONTIN) 100 MG capsule, Take 1 capsule (100 mg total) by mouth at bedtime. .  hydrOXYzine (ATARAX/VISTARIL) 10 MG tablet, Take 1 tablet (10 mg total) by mouth 3 (three) times daily as needed for anxiety. .  ondansetron (ZOFRAN) 4 MG tablet, Take 1 tablet (4 mg total) by mouth every 8 (eight) hours as needed for nausea or vomiting. .  Salicylic Acid 3 % SHAM, Use daily as directed for dry scalp .  selenium sulfide (SELSUN) 2.5 % shampoo,  .  venlafaxine XR (EFFEXOR-XR) 150 MG 24 hr capsule, Take 1 capsule (150 mg total) by mouth daily with breakfast. .  Vitamin D, Ergocalciferol, (DRISDOL) 1.25 MG (50000 UT) CAPS capsule, Take 1 capsule (50,000 Units total) by mouth every 7 (seven) days for 12 doses.    Past medical history, social, surgical and family history all reviewed in electronic medical record.  No pertanent information unless stated regarding to the chief complaint.   Review of Systems:  No headache, visual changes, nausea, vomiting, diarrhea, constipation, dizziness, abdominal pain, skin rash, fevers, chills, night sweats, weight loss, swollen lymph nodes, body aches, joint swelling, muscle aches, chest pain, shortness of breath, mood  changes.   Objective  unknown if currently breastfeeding. Systems examined below as of    General: No apparent distress alert and oriented x3 mood and affect normal, dressed appropriately.  HEENT: Pupils equal, extraocular movements intact  Respiratory: Patient's speak in full sentences and does not appear short of breath  Cardiovascular: No lower extremity edema, non tender, no erythema  Skin: Warm dry intact with no signs of infection or rash on extremities or on axial skeleton.  Abdomen: Soft nontender  Neuro: Cranial nerves II through XII are intact, neurovascularly intact in all extremities with 2+ DTRs and  2+ pulses.  Lymph: No lymphadenopathy of posterior or anterior cervical chain or axillae bilaterally.  Gait normal with good balance and coordination.  MSK:  Non tender with full range of motion and good stability and symmetric strength and tone of shoulders, elbows, wrist, hip, knee and ankles bilaterally.     Impression and Recommendations:     This case required medical decision making of moderate complexity. The above documentation has been reviewed and is accurate and complete Lyndal Pulley, DO       Note: This dictation was prepared with Dragon dictation along with smaller phrase technology. Any transcriptional errors that result from this process are unintentional.

## 2018-03-10 ENCOUNTER — Ambulatory Visit: Payer: Medicare Other | Admitting: Family Medicine

## 2018-03-19 ENCOUNTER — Other Ambulatory Visit: Payer: Self-pay | Admitting: Family

## 2018-04-23 NOTE — Progress Notes (Signed)
[name] [address] [place], [place] [address] Phone: [phone] Subjective:    Virtual Visit via Video Note  I connected with Melissa [name] on 04/23/18 at 10:45 AM EDT by a video enabled telemedicine application and verified that I am speaking with the correct person using two identifiers.   I discussed the limitations of evaluation and management by telemedicine and the availability of in person appointments. The patient expressed understanding and agreed to proceed.  Was at home when I was on work on the visual        Lyndal Pulley, DO   I discussed the assessment and treatment plan with the patient. The patient was provided an opportunity to ask questions and all were answered. The patient agreed with the plan and demonstrated an understanding of the instructions.   The patient was advised to call back or seek an in-person evaluation if the symptoms worsen or if the condition fails to improve as anticipated.  I provided 28 minutes of non-face-to-face time during this encounter.   Lyndal Pulley, DO    CC: all over pain  [id number]  Melissa [name] is a 43 y.o. female coming in with complaint of  Allover pain.  Patient has had multiple different pains previously.  Been worked up for polyarthralgia with fairly unremarkable results.  Seems to be more of a fibromyalgia.  Patient states that the pain is unrelenting at the moment.  Unable to do anything.  States that the medications including gabapentin, anti-inflammatories, Effexor does not seem to be helping.  Looking for some relief.    Past Medical History:  Diagnosis Date  . Anxiety   . Arthritis   . Carpal tunnel syndrome   . Depression   . Hammer toe 04/2013   bilateral 4th and 5th   . Ingrown left big toenail 04/2013  . Mental disorder   . Neuromuscular disorder ([place])   . Post term pregnancy over 40 weeks 09/21/2015  . SVD (spontaneous vaginal delivery) 09/22/2015   . UTI (lower urinary tract infection)    Past Surgical History:  Procedure Laterality Date  . BREAST SURGERY    . CORRECTION HAMMER TOE Bilateral   . CRYOABLATION  2004  . CYSTOSCOPY WITH HYDRODISTENSION AND BIOPSY  07/28/2004   with urethral dilatation  . DILATION AND CURETTAGE OF UTERUS    . DILATION AND EVACUATION  12/21/2011   Procedure: DILATATION AND EVACUATION;  Surgeon: Terrance Mass, MD;  Location: [facility];  Service: Gynecology;  Laterality: N/A;  first trimester   . HAMMER TOE SURGERY Bilateral 04/20/2013   Procedure: BILATERAL HAMMER TOE REPAIR AND EXCISION NAIL PERMANENT LEFT FIRST;  Surgeon: Harriet Masson, DPM;  Location: [facility];  Service: Podiatry;  Laterality: Bilateral;  Hammer toe repair fourth and fifth toes bilateral feet with screw placement fourth toes and partial nail excision left great toe  . HYSTEROSALPINGOGRAM    . TOE SURGERY     ingrown toenail  . WISDOM TOOTH EXTRACTION     Social History   Socioeconomic History  . Marital status: Single    Spouse name: Not on file  . Number of children: Not on file  . Years of education: Not on file  . Highest education level: Not on file  Occupational History  . Not on file  Social Needs  . Financial resource strain: Not on file  . Food insecurity:    Worry: Not on file  Inability: Not on file  . Transportation needs:    Medical: Not on file    Non-medical: Not on file  Tobacco Use  . Smoking status: Never Smoker  . Smokeless tobacco: Never Used  Substance and Sexual Activity  . Alcohol use: No    Comment: occasionally  . Drug use: No  . Sexual activity: Yes    Birth control/protection: I.U.D.  Lifestyle  . Physical activity:    Days per week: Not on file    Minutes per session: Not on file  . Stress: Not on file  Relationships  . Social connections:    Talks on phone: Not on file    Gets together: Not on file    Attends religious service: Not on file     Active member of club or organization: Not on file    Attends meetings of clubs or organizations: Not on file    Relationship status: Not on file  Other Topics Concern  . Not on file  Social History Narrative  . Not on file   No Known Allergies Family History  Problem Relation Age of Onset  . Cancer Father        lung cancer  . Birth defects Paternal Grandfather   . Seizures Mother   . Arthritis Mother   . Thyroid disease Maternal Grandmother   . Heart disease Paternal Grandmother        2 CABG    Current Outpatient Medications (Endocrine & Metabolic):  .  levonorgestrel (MIRENA) 20 MCG/24HR IUD, 1 each by Intrauterine route once. .  methylPREDNISolone (MEDROL) 4 MG tablet, 3 pills 3 days, 2 pills 3 days, 1 pill 3 days, 1/2 pills 3 days   Current Outpatient Medications (Respiratory):  .  fluticasone (FLONASE) 50 MCG/ACT nasal spray, Place 2 sprays into both nostrils daily.  Current Outpatient Medications (Analgesics):  .  ibuprofen (ADVIL,MOTRIN) 800 MG tablet, Take 1 tablet (800 mg total) by mouth every 8 (eight) hours as needed. .  meloxicam (MOBIC) 7.5 MG tablet, TAKE 1 TABLET BY MOUTH ONCE DAILY WITH FOOD  Current Outpatient Medications (Hematological):  .  tranexamic acid (LYSTEDA) 650 MG TABS tablet, Take 650 mg by mouth daily.  Current Outpatient Medications (Other):  .  amphetamine-dextroamphetamine (ADDERALL) 20 MG tablet, TAKE 1 TABLET BY MOUTH IN THE MORNING AND 1 EVERYDAY AT NOON AND 1 TABLET AT 4PM .  buPROPion (WELLBUTRIN XL) 150 MG 24 hr tablet, TAKE 1 TABLET BY MOUTH ONCE A DAY FOR 7 DAYS THEN 2 TABLETS IN EVERDAY FOR 7 DAYS THEN 3 TABLETS BY MOUTH EVERYDAY THEREAFTER .  clobetasol (TEMOVATE) 0.05 % external solution, APPLY SOLUTION TOPICALLY TO AFFECTED AREA UP TO TWICE DAILY AS NEEDED (NOT TO FACE GROIN AND AXILLA) .  escitalopram (LEXAPRO) 20 MG tablet, Take 20 mg by mouth every morning. .  fluticasone (CUTIVATE) 0.05 % cream, fluticasone propionate 0.05 %  topical cream .  gabapentin (NEURONTIN) 100 MG capsule, Take 1 capsule (100 mg total) by mouth at bedtime. .  hydrOXYzine (ATARAX/VISTARIL) 10 MG tablet, Take 1 tablet (10 mg total) by mouth 3 (three) times daily as needed for anxiety. .  ondansetron (ZOFRAN) 4 MG tablet, Take 1 tablet (4 mg total) by mouth every 8 (eight) hours as needed for nausea or vomiting. .  Salicylic Acid 3 % SHAM, Use daily as directed for dry scalp .  selenium sulfide (SELSUN) 2.5 % shampoo, USE  LOTION EXTERNALLY TO AFFECTED AREA ONCE DAILY AS NEEDED FOR  IRRITATION .  venlafaxine XR (EFFEXOR-XR) 150 MG 24 hr capsule, Take 1 capsule (150 mg total) by mouth daily with breakfast.    Past medical history, social, surgical and family history all reviewed in electronic medical record.  No pertanent information unless stated regarding to the chief complaint.   Review of Systems:  No headache, visual changes, nausea, vomiting, diarrhea, constipation, dizziness, abdominal pain, skin rash, fevers, chills, night sweats, weight loss, swollen lymph nodes, body aches, joint swelling, muscle aches, chest pain, shortness of breath, mood changes.   Objective  unknown if currently breastfeeding. Systems examined below as of    General: No apparent distress alert and oriented x3 mood and affect normal, dressed appropriately.  Video platform did not work well but audio did.  Did most of it though also through a chat service.    Impression and Recommendations:     . The above documentation has been reviewed and is accurate and complete Lyndal Pulley, DO       Note: This dictation was prepared with Dragon dictation along with smaller phrase technology. Any transcriptional errors that result from this process are unintentional.

## 2018-04-24 ENCOUNTER — Encounter: Payer: Self-pay | Admitting: Family Medicine

## 2018-04-24 ENCOUNTER — Ambulatory Visit (INDEPENDENT_AMBULATORY_CARE_PROVIDER_SITE_OTHER): Payer: Medicare Other | Admitting: Family Medicine

## 2018-04-24 DIAGNOSIS — M2550 Pain in unspecified joint: Secondary | ICD-10-CM | POA: Diagnosis not present

## 2018-04-24 MED ORDER — METHYLPREDNISOLONE 4 MG PO TABS: ORAL_TABLET | ORAL | 0 refills | Status: DC

## 2018-04-24 NOTE — Patient Instructions (Signed)
God  °

## 2018-04-24 NOTE — Assessment & Plan Note (Signed)
Patient is having an overall flare of the polyarthralgia.  Had some difficulty with the video platform.  Did not significant number of text messaging back-and-forth.  Patient significantly wanted prednisone.  We discussed with this as well as the coronavirus outbreak that that could potentially increase patient's risks.  Patient was adamant that she felt like she needed this medication because it is the only thing that has helped in the past.  Warned her to avoid going out and continue with social distancing at great length.  Patient is to follow-up with me again in the office within weeks.  The only reason we did send in the steroid was patient did make it clear she would have to potentially go to the emergency room otherwise.  Continue all other medications.

## 2018-05-01 ENCOUNTER — Telehealth: Payer: Self-pay

## 2018-05-01 NOTE — Telephone Encounter (Signed)
Copied from [place] [id number]. Topic: General - Other >> May 01, 2018 12:30 PM Carolyn Stare wrote:  Pt said she has been taking the medication for a week and is still having pain , would like a call back   methylPREDNISolone (MEDROL) 4 MG tablet

## 2018-05-01 NOTE — Telephone Encounter (Signed)
Spoke with patient. She is still in pain and wants to know when the medication will start working. She has 2 days left in her course of treatment. Let her know that Dr. Tamala Julian wants for her to finish the course and then assess how she is feeling at time. Patient is ok with plan. Did schedule a virtual visit for her later next week to discuss pain with Dr. Tamala Julian.

## 2018-05-05 NOTE — Telephone Encounter (Signed)
Pt would like to know how long she needs to stay home. 5 days after she take the first pill or 5 days after the last pill. Please advise

## 2018-05-08 ENCOUNTER — Ambulatory Visit (INDEPENDENT_AMBULATORY_CARE_PROVIDER_SITE_OTHER): Payer: Medicare Other | Admitting: Family Medicine

## 2018-05-08 ENCOUNTER — Encounter: Payer: Self-pay | Admitting: Family Medicine

## 2018-05-08 DIAGNOSIS — M2550 Pain in unspecified joint: Secondary | ICD-10-CM

## 2018-05-08 DIAGNOSIS — E559 Vitamin D deficiency, unspecified: Secondary | ICD-10-CM | POA: Diagnosis not present

## 2018-05-08 MED ORDER — MELOXICAM 15 MG PO TABS: 15.0000 mg | ORAL_TABLET | Freq: Every day | ORAL | 0 refills | Status: DC

## 2018-05-08 MED ORDER — VITAMIN D (ERGOCALCIFEROL) 1.25 MG (50000 UNIT) PO CAPS: 50000.0000 [IU] | ORAL_CAPSULE | ORAL | 0 refills | Status: DC

## 2018-05-08 NOTE — Assessment & Plan Note (Signed)
Re-started 

## 2018-05-08 NOTE — Progress Notes (Signed)
[name] [place], [place] [address] Phone: [phone] Subjective:     Virtual Visit via Video Note  I connected with Melissa [name] on 05/08/18 at  2:45 PM EDT by a video enabled telemedicine application and verified that I am speaking with the correct person using two identifiers.  Location: Patient: Home setting Provider: I was in office setting   I discussed the limitations of evaluation and management by telemedicine and the availability of in person appointments. The patient expressed understanding and agreed to proceed    I discussed the assessment and treatment plan with the patient. The patient was provided an opportunity to ask questions and all were answered. The patient agreed with the plan and demonstrated an understanding of the instructions.   The patient was advised to call back or seek an in-person evaluation if the symptoms worsen or if the condition fails to improve as anticipated.  I provided 16 minutes of face-to-face time during this encounter.   Melissa Pulley, DO   CC: Pain all over follow-up  [id number]  Melissa [name] is a 43 y.o. female coming in with complaint of pain all over again.  Patient has had low vitamin D but is not taking any.  Patient went into the steroid last week.  Was given it and states that it is improving but very slowly.  Still having pain everywhere.  Rates the severity of pain is 9 on a 10 sometimes.  Patient does not want to live her life this way.  Patient is taking the Effexor still.  Recently did have a surgery to have a lipoma removed.      Past Medical History:  Diagnosis Date  . Anxiety   . Arthritis   . Carpal tunnel syndrome   . Depression   . Hammer toe 04/2013   bilateral 4th and 5th   . Ingrown left big toenail 04/2013  . Mental disorder   . Neuromuscular disorder ([place])   . Post term pregnancy over 40 weeks 09/21/2015  . SVD (spontaneous vaginal delivery)  09/22/2015  . UTI (lower urinary tract infection)    Past Surgical History:  Procedure Laterality Date  . BREAST SURGERY    . CORRECTION HAMMER TOE Bilateral   . CRYOABLATION  2004  . CYSTOSCOPY WITH HYDRODISTENSION AND BIOPSY  07/28/2004   with urethral dilatation  . DILATION AND CURETTAGE OF UTERUS    . DILATION AND EVACUATION  12/21/2011   Procedure: DILATATION AND EVACUATION;  Surgeon: Melissa Mass, MD;  Location: [facility];  Service: Gynecology;  Laterality: N/A;  first trimester   . HAMMER TOE SURGERY Bilateral 04/20/2013   Procedure: BILATERAL HAMMER TOE REPAIR AND EXCISION NAIL PERMANENT LEFT FIRST;  Surgeon: Melissa [name], DPM;  Location: [facility];  Service: Podiatry;  Laterality: Bilateral;  Hammer toe repair fourth and fifth toes bilateral feet with screw placement fourth toes and partial nail excision left great toe  . HYSTEROSALPINGOGRAM    . TOE SURGERY     ingrown toenail  . WISDOM TOOTH EXTRACTION     Social History   Socioeconomic History  . Marital status: Single    Spouse name: Not on file  . Number of children: Not on file  . Years of education: Not on file  . Highest education level: Not on file  Occupational History  . Not on file  Social Needs  . Financial resource strain: Not on file  .  Food insecurity:    Worry: Not on file    Inability: Not on file  . Transportation needs:    Medical: Not on file    Non-medical: Not on file  Tobacco Use  . Smoking status: Never Smoker  . Smokeless tobacco: Never Used  Substance and Sexual Activity  . Alcohol use: No    Comment: occasionally  . Drug use: No  . Sexual activity: Yes    Birth control/protection: I.U.D.  Lifestyle  . Physical activity:    Days per week: Not on file    Minutes per session: Not on file  . Stress: Not on file  Relationships  . Social connections:    Talks on phone: Not on file    Gets together: Not on file    Attends religious service: Not  on file    Active member of club or organization: Not on file    Attends meetings of clubs or organizations: Not on file    Relationship status: Not on file  Other Topics Concern  . Not on file  Social History Narrative  . Not on file   No Known Allergies Family History  Problem Relation Age of Onset  . Cancer Father        lung cancer  . Birth defects Paternal Grandfather   . Seizures Mother   . Arthritis Mother   . Thyroid disease Maternal Grandmother   . Heart disease Paternal Grandmother        2 CABG    Current Outpatient Medications (Endocrine & Metabolic):  .  levonorgestrel (MIRENA) 20 MCG/24HR IUD, 1 each by Intrauterine route once. .  methylPREDNISolone (MEDROL) 4 MG tablet, 3 pills 3 days, 2 pills 3 days, 1 pill 3 days, 1/2 pills 3 days   Current Outpatient Medications (Respiratory):  .  fluticasone (FLONASE) 50 MCG/ACT nasal spray, Place 2 sprays into both nostrils daily.  Current Outpatient Medications (Analgesics):  .  ibuprofen (ADVIL,MOTRIN) 800 MG tablet, Take 1 tablet (800 mg total) by mouth every 8 (eight) hours as needed. .  meloxicam (MOBIC) 15 MG tablet, Take 1 tablet (15 mg total) by mouth daily.  Current Outpatient Medications (Hematological):  .  tranexamic acid (LYSTEDA) 650 MG TABS tablet, Take 650 mg by mouth daily.  Current Outpatient Medications (Other):  .  amphetamine-dextroamphetamine (ADDERALL) 20 MG tablet, TAKE 1 TABLET BY MOUTH IN THE MORNING AND 1 EVERYDAY AT NOON AND 1 TABLET AT 4PM .  buPROPion (WELLBUTRIN XL) 150 MG 24 hr tablet, TAKE 1 TABLET BY MOUTH ONCE A DAY FOR 7 DAYS THEN 2 TABLETS IN EVERDAY FOR 7 DAYS THEN 3 TABLETS BY MOUTH EVERYDAY THEREAFTER .  clobetasol (TEMOVATE) 0.05 % external solution, APPLY SOLUTION TOPICALLY TO AFFECTED AREA UP TO TWICE DAILY AS NEEDED (NOT TO FACE GROIN AND AXILLA) .  escitalopram (LEXAPRO) 20 MG tablet, Take 20 mg by mouth every morning. .  fluticasone (CUTIVATE) 0.05 % cream, fluticasone  propionate 0.05 % topical cream .  gabapentin (NEURONTIN) 100 MG capsule, Take 1 capsule (100 mg total) by mouth at bedtime. .  hydrOXYzine (ATARAX/VISTARIL) 10 MG tablet, Take 1 tablet (10 mg total) by mouth 3 (three) times daily as needed for anxiety. .  ondansetron (ZOFRAN) 4 MG tablet, Take 1 tablet (4 mg total) by mouth every 8 (eight) hours as needed for nausea or vomiting. .  Salicylic Acid 3 % SHAM, Use daily as directed for dry scalp .  selenium sulfide (SELSUN) 2.5 % shampoo, USE  LOTION EXTERNALLY TO AFFECTED AREA ONCE DAILY AS NEEDED FOR  IRRITATION .  venlafaxine XR (EFFEXOR-XR) 150 MG 24 hr capsule, Take 1 capsule (150 mg total) by mouth daily with breakfast. .  Vitamin D, Ergocalciferol, (DRISDOL) 1.25 MG (50000 UT) CAPS capsule, Take 1 capsule (50,000 Units total) by mouth every 7 (seven) days.    Past medical history, social, surgical and family history all reviewed in electronic medical record.  No pertanent information unless stated regarding to the chief complaint.   Review of Systems:  No , visual changes, nausea, vomiting, diarrhea, constipation, dizziness, abdominal pain, skin rash, fevers, chills, night sweats, weight loss, swollen lymph nodes,  chest pain, shortness of breath, mood changes.  Positive muscle aches, body aches  Objective  unknown if currently breastfeeding.    Had difficulty with the video portion so did everything over telephone     Impression and Recommendations:    y. The above documentation has been reviewed and is accurate and complete Melissa Pulley, DO       Note: This dictation was prepared with Dragon dictation along with smaller phrase technology. Any transcriptional errors that result from this process are unintentional.

## 2018-05-08 NOTE — Assessment & Plan Note (Signed)
Patient has had difficulty for quite some time.  We discussed icing regimen and home exercises.  We discussed which activities of doing which wants to avoid.  Started meloxicam at a higher dose.  Warned of potential side effects.  In addition to this we discussed the once weekly vitamin D again.  Patient seen when reviewing the chart the patient did better while on this medication.  Patient will follow-up with me again in 4 to 6 weeks.

## 2018-06-05 ENCOUNTER — Other Ambulatory Visit: Payer: Self-pay | Admitting: Family Medicine

## 2018-06-18 ENCOUNTER — Telehealth: Payer: Self-pay

## 2018-06-18 ENCOUNTER — Other Ambulatory Visit: Payer: Self-pay

## 2018-06-18 MED ORDER — IBUPROFEN 800 MG PO TABS: 800.0000 mg | ORAL_TABLET | Freq: Two times a day (BID) | ORAL | 0 refills | Status: DC

## 2018-06-19 ENCOUNTER — Other Ambulatory Visit: Payer: Self-pay

## 2018-06-19 DIAGNOSIS — M5412 Radiculopathy, cervical region: Secondary | ICD-10-CM

## 2018-06-19 NOTE — Telephone Encounter (Signed)
Sorry we can do PT  Or another epidural  Otherwise not a lot that I have at moment

## 2018-06-19 NOTE — Telephone Encounter (Signed)
Sent patient MyChart message in regards to treatment options.

## 2018-07-06 ENCOUNTER — Other Ambulatory Visit: Payer: Self-pay | Admitting: Family

## 2018-07-24 ENCOUNTER — Other Ambulatory Visit: Payer: Self-pay

## 2018-07-24 ENCOUNTER — Other Ambulatory Visit: Payer: Self-pay | Admitting: Family

## 2018-07-24 ENCOUNTER — Telehealth: Payer: Self-pay

## 2018-07-24 ENCOUNTER — Other Ambulatory Visit: Payer: Self-pay | Admitting: *Deleted

## 2018-07-24 DIAGNOSIS — Z20822 Contact with and (suspected) exposure to covid-19: Secondary | ICD-10-CM

## 2018-07-24 DIAGNOSIS — M5412 Radiculopathy, cervical region: Secondary | ICD-10-CM

## 2018-07-24 DIAGNOSIS — Z20828 Contact with and (suspected) exposure to other viral communicable diseases: Secondary | ICD-10-CM

## 2018-07-24 NOTE — Telephone Encounter (Signed)
COVID testing requested due to exposure.

## 2018-07-26 LAB — NOVEL CORONAVIRUS, NAA: SARS-CoV-2, NAA: NOT DETECTED

## 2018-08-07 ENCOUNTER — Encounter: Payer: Self-pay | Admitting: Podiatry

## 2018-08-07 ENCOUNTER — Ambulatory Visit (INDEPENDENT_AMBULATORY_CARE_PROVIDER_SITE_OTHER): Payer: Medicare Other | Admitting: Podiatry

## 2018-08-07 ENCOUNTER — Other Ambulatory Visit: Payer: Self-pay

## 2018-08-07 VITALS — Temp 98.7°F

## 2018-08-07 DIAGNOSIS — L600 Ingrowing nail: Secondary | ICD-10-CM | POA: Diagnosis not present

## 2018-08-07 MED ORDER — NEOMYCIN-POLYMYXIN-HC 1 % OT SOLN: OTIC | 1 refills | Status: DC

## 2018-08-07 NOTE — Patient Instructions (Signed)

## 2018-08-07 NOTE — Progress Notes (Signed)
She presents today chief complaint of ingrown nail or fleshy fibular border to the hallux left.  States that it seems to be ingrown and painful will be here.  Objective: Vital signs are stable alert and oriented x3 she has an area of reactive hyperkeratosis and a small fragment of nail along the fibular border of the hallux left.  Exquisitely tender on palpation no purulence no malodor.  Assessment: Ingrown nail soft tissue hyperkeratinization.  Plan: Chemical matrixectomy was performed along with fibular border the hallux left.  She tolerated procedure well without complication was was provided with both oral and written home-going structures for care and soaking of the toe as well as a prescription for Corticosporin otic.  Follow-up with her in 2 weeks

## 2018-08-08 ENCOUNTER — Other Ambulatory Visit: Payer: Self-pay | Admitting: Family Medicine

## 2018-08-21 ENCOUNTER — Ambulatory Visit: Payer: Medicare Other | Admitting: Podiatry

## 2018-08-22 ENCOUNTER — Ambulatory Visit: Payer: Medicare Other | Admitting: Family

## 2018-08-28 ENCOUNTER — Encounter: Payer: Self-pay | Admitting: Podiatry

## 2018-08-28 ENCOUNTER — Ambulatory Visit (INDEPENDENT_AMBULATORY_CARE_PROVIDER_SITE_OTHER): Payer: Medicare Other | Admitting: Podiatry

## 2018-08-28 ENCOUNTER — Other Ambulatory Visit: Payer: Self-pay

## 2018-08-28 DIAGNOSIS — L600 Ingrowing nail: Secondary | ICD-10-CM

## 2018-08-28 DIAGNOSIS — Z9889 Other specified postprocedural states: Secondary | ICD-10-CM

## 2018-08-30 ENCOUNTER — Encounter: Payer: Self-pay | Admitting: Podiatry

## 2018-08-30 NOTE — Progress Notes (Signed)
She presents today for a follow-up of her matrixectomy fibular border hallux left.  States that is doing better she states I think this is going to do the trick it looks better than the other ones have.  Objective: Vital signs are stable she is alert and oriented x3.  There is no erythema edema cellulitis drainage odor appears to be healing nicely.  Assessment: Well-healing surgical toe.  Plan: Encouraged her to soak it for about another week and then discontinue also encouraged her to apply emollient type creams once the wound has completely healed.  Also instructed her not to get a pedicure until the wound has completely healed.

## 2018-09-14 NOTE — Progress Notes (Signed)
[name] [name], [place] [address] Phone: [phone] Subjective:   I Melissa [name] am serving as a Education administrator for Dr. Hulan Saas.  I'm seeing this patient by the request  of:    CC: Bilateral neck pain with hand numbness, or lower pain  [id number]  Melissa [name] is a 43 y.o. female coming in with complaint of polyarthralgia. States she hurts everywhere. She was not happy with the Neuro surgeon that we referred her to. States he is very unprofessional and that he has not contacted her since her last visit. Been about 2.5 weeks.   Patient states that she can never be without pain at this moment.     Past Medical History:  Diagnosis Date  . Anxiety   . Arthritis   . Carpal tunnel syndrome   . Depression   . Hammer toe 04/2013   bilateral 4th and 5th   . Ingrown left big toenail 04/2013  . Mental disorder   . Neuromuscular disorder ([place])   . Post term pregnancy over 40 weeks 09/21/2015  . SVD (spontaneous vaginal delivery) 09/22/2015  . UTI (lower urinary tract infection)    Past Surgical History:  Procedure Laterality Date  . BREAST SURGERY    . CORRECTION HAMMER TOE Bilateral   . CRYOABLATION  2004  . CYSTOSCOPY WITH HYDRODISTENSION AND BIOPSY  07/28/2004   with urethral dilatation  . DILATION AND CURETTAGE OF UTERUS    . DILATION AND EVACUATION  12/21/2011   Procedure: DILATATION AND EVACUATION;  Surgeon: Terrance Mass, MD;  Location: [facility];  Service: Gynecology;  Laterality: N/A;  first trimester   . HAMMER TOE SURGERY Bilateral 04/20/2013   Procedure: BILATERAL HAMMER TOE REPAIR AND EXCISION NAIL PERMANENT LEFT FIRST;  Surgeon: Harriet Masson, DPM;  Location: [facility];  Service: Podiatry;  Laterality: Bilateral;  Hammer toe repair fourth and fifth toes bilateral feet with screw placement fourth toes and partial nail excision left great toe  . HYSTEROSALPINGOGRAM    . TOE SURGERY      ingrown toenail  . WISDOM TOOTH EXTRACTION     Social History   Socioeconomic History  . Marital status: Single    Spouse name: Not on file  . Number of children: Not on file  . Years of education: Not on file  . Highest education level: Not on file  Occupational History  . Not on file  Social Needs  . Financial resource strain: Not on file  . Food insecurity    Worry: Not on file    Inability: Not on file  . Transportation needs    Medical: Not on file    Non-medical: Not on file  Tobacco Use  . Smoking status: Never Smoker  . Smokeless tobacco: Never Used  Substance and Sexual Activity  . Alcohol use: No    Comment: occasionally  . Drug use: No  . Sexual activity: Yes    Birth control/protection: I.U.D.  Lifestyle  . Physical activity    Days per week: Not on file    Minutes per session: Not on file  . Stress: Not on file  Relationships  . Social Herbalist on phone: Not on file    Gets together: Not on file    Attends religious service: Not on file    Active member of club or organization: Not on file    Attends meetings of clubs or organizations:  Not on file    Relationship status: Not on file  Other Topics Concern  . Not on file  Social History Narrative  . Not on file   No Known Allergies Family History  Problem Relation Age of Onset  . Cancer Father        lung cancer  . Birth defects Paternal Grandfather   . Seizures Mother   . Arthritis Mother   . Thyroid disease Maternal Grandmother   . Heart disease Paternal Grandmother        2 CABG    Current Outpatient Medications (Endocrine & Metabolic):  .  levonorgestrel (MIRENA) 20 MCG/24HR IUD, 1 each by Intrauterine route once. .  methylPREDNISolone (MEDROL) 4 MG tablet, 3 pills 3 days, 2 pills 3 days, 1 pill 3 days, 1/2 pills 3 days .  levothyroxine (SYNTHROID) 50 MCG tablet, Take 1 tablet (50 mcg total) by mouth daily.   Current Outpatient Medications (Respiratory):  .  fluticasone  (FLONASE) 50 MCG/ACT nasal spray, Use 2 spray(s) in each nostril once daily  Current Outpatient Medications (Analgesics):  .  ibuprofen (ADVIL) 800 MG tablet, Take 1 tablet (800 mg total) by mouth 2 (two) times a day. .  meloxicam (MOBIC) 15 MG tablet, Take 1 tablet by mouth once daily  Current Outpatient Medications (Hematological):  .  tranexamic acid (LYSTEDA) 650 MG TABS tablet, Take 650 mg by mouth daily.  Current Outpatient Medications (Other):  .  amphetamine-dextroamphetamine (ADDERALL) 20 MG tablet, TAKE 1 TABLET BY MOUTH IN THE MORNING AND 1 EVERYDAY AT NOON AND 1 TABLET AT 4PM .  buPROPion (WELLBUTRIN XL) 150 MG 24 hr tablet, TAKE 1 TABLET BY MOUTH ONCE A DAY FOR 7 DAYS THEN 2 TABLETS IN EVERDAY FOR 7 DAYS THEN 3 TABLETS BY MOUTH EVERYDAY THEREAFTER .  clobetasol (TEMOVATE) 0.05 % external solution, APPLY SOLUTION TOPICALLY TO AFFECTED AREA UP TO TWICE DAILY AS NEEDED (NOT TO FACE GROIN AND AXILLA) .  escitalopram (LEXAPRO) 20 MG tablet, Take 20 mg by mouth every morning. .  fluticasone (CUTIVATE) 0.05 % cream, fluticasone propionate 0.05 % topical cream .  gabapentin (NEURONTIN) 100 MG capsule, Take 1 capsule (100 mg total) by mouth at bedtime. .  hydrOXYzine (ATARAX/VISTARIL) 10 MG tablet, Take 1 tablet (10 mg total) by mouth 3 (three) times daily as needed for anxiety. .  NEOMYCIN-POLYMYXIN-HYDROCORTISONE (CORTISPORIN) 1 % SOLN OTIC solution, Apply 1-2 drops to toe BID after soaking .  ondansetron (ZOFRAN) 4 MG tablet, Take 1 tablet (4 mg total) by mouth every 8 (eight) hours as needed for nausea or vomiting. .  Salicylic Acid 3 % SHAM, Use daily as directed for dry scalp .  selenium sulfide (SELSUN) 2.5 % shampoo, USE  LOTION EXTERNALLY TO AFFECTED AREA ONCE DAILY AS NEEDED FOR  IRRITATION .  venlafaxine XR (EFFEXOR-XR) 150 MG 24 hr capsule, Take 1 capsule (150 mg total) by mouth daily with breakfast. .  Vitamin D, Ergocalciferol, (DRISDOL) 1.25 MG (50000 UT) CAPS capsule, Take 1  capsule (50,000 Units total) by mouth every 7 (seven) days.    Past medical history, social, surgical and family history all reviewed in electronic medical record.  No pertanent information unless stated regarding to the chief complaint.   Review of Systems:  No headache, visual changes, nausea, vomiting, diarrhea, constipation, dizziness, abdominal pain, skin rash, fevers, chills, night sweats, weight loss, swollen lymph nodes, , chest pain, shortness of breath, mood changes.  Positive muscle aches, body aches  Objective  Blood pressure  120/70, pulse 95, height 5\' 1"  (1.549 m), last menstrual period 09/02/2018, SpO2 98 %, unknown if currently breastfeeding.    General: No apparent distress alert and oriented x3 mood and affect normal, dressed appropriately.  HEENT: Pupils equal, extraocular movements intact  Respiratory: Patient's speak in full sentences and does not appear short of breath  Cardiovascular: No lower extremity edema, non tender, no erythema  Skin: Warm dry intact with no signs of infection or rash on extremities or on axial skeleton.  Abdomen: Soft nontender  Neuro: Cranial nerves II through XII are intact, neurovascularly intact in all extremities with 2+ DTRs and 2+ pulses.  Lymph: No lymphadenopathy of posterior or anterior cervical chain or axillae bilaterally.  Gait mild antalgic MSK:  tender with mild range of motion and good stability and symmetric strength and tone of shoulders, elbows, wrist, hip, knee and ankles bilaterally.  Neck exam mild loss of lordosis.  Patient has good grip strength noted today.  Positive Tinel's at the wrist bilaterally.   Impression and Recommendations:     This case required medical decision making of moderate complexity. The above documentation has been reviewed and is accurate and complete Lyndal Pulley, DO       Note: This dictation was prepared with Dragon dictation along with smaller phrase technology. Any transcriptional  errors that result from this process are unintentional.

## 2018-09-15 ENCOUNTER — Encounter: Payer: Self-pay | Admitting: Family Medicine

## 2018-09-15 ENCOUNTER — Other Ambulatory Visit: Payer: Self-pay

## 2018-09-15 ENCOUNTER — Ambulatory Visit (INDEPENDENT_AMBULATORY_CARE_PROVIDER_SITE_OTHER)
Admission: RE | Admit: 2018-09-15 | Discharge: 2018-09-15 | Disposition: A | Discharge status: Home / Self Care | Payer: Medicare Other | Source: Ambulatory Visit | Attending: Family Medicine | Admitting: Family Medicine

## 2018-09-15 ENCOUNTER — Ambulatory Visit (INDEPENDENT_AMBULATORY_CARE_PROVIDER_SITE_OTHER): Payer: Medicare Other | Admitting: Family Medicine

## 2018-09-15 VITALS — BP 120/70 | HR 95 | Ht 61.0 in

## 2018-09-15 DIAGNOSIS — M542 Cervicalgia: Secondary | ICD-10-CM

## 2018-09-15 DIAGNOSIS — M5412 Radiculopathy, cervical region: Secondary | ICD-10-CM

## 2018-09-15 DIAGNOSIS — M2550 Pain in unspecified joint: Secondary | ICD-10-CM

## 2018-09-15 MED ORDER — LEVOTHYROXINE SODIUM 50 MCG PO TABS: 50.0000 ug | ORAL_TABLET | Freq: Every day | ORAL | 3 refills | Status: DC

## 2018-09-15 MED ORDER — METHYLPREDNISOLONE ACETATE 80 MG/ML IJ SUSP
80.0000 mg | Freq: Once | INTRAMUSCULAR | Status: AC
  Administered 2018-09-15: 80 mg via INTRAMUSCULAR

## 2018-09-15 MED ORDER — KETOROLAC TROMETHAMINE 60 MG/2ML IM SOLN
60.0000 mg | Freq: Once | INTRAMUSCULAR | Status: AC
  Administered 2018-09-15: 60 mg via INTRAMUSCULAR

## 2018-09-15 NOTE — Assessment & Plan Note (Signed)
Continues to have polyarthralgia.  Has known bilateral carpal tunnel more separate contributing to some of the discomfort and pain.  Patient has not been responding as much of the medications which she has been previously.  We discussed what doctors to do which wants to void.  Patient wants x-rays of the neck done.  We will have them done.  Patient will be following up with her chiropractor.  Try a very low dose of Synthroid in hopes that this will be beneficial.  Follow-up again with me in clinic 8 to 12 weeks

## 2018-09-15 NOTE — Patient Instructions (Signed)
See me again in 3 months Xray downstairs

## 2018-09-15 NOTE — Assessment & Plan Note (Signed)
Patient feels like she is worsening restart Synthroid at a low dose and see if this will augment some of the underlying chronic pain.  Patient is seeing a chiropractor and will continue to do so.  Follow-up with me again in 3 months.  Toradol and Depo-Medrol given today

## 2018-10-04 ENCOUNTER — Other Ambulatory Visit: Payer: Self-pay | Admitting: Family

## 2018-10-30 ENCOUNTER — Encounter: Payer: Medicare Other | Admitting: Podiatry

## 2018-10-30 ENCOUNTER — Ambulatory Visit: Payer: Medicare Other

## 2018-11-12 NOTE — Progress Notes (Signed)
This encounter was created in error - please disregard.

## 2018-11-13 ENCOUNTER — Ambulatory Visit: Payer: Medicare Other | Admitting: Podiatry

## 2018-11-18 ENCOUNTER — Ambulatory Visit (INDEPENDENT_AMBULATORY_CARE_PROVIDER_SITE_OTHER): Payer: Medicare Other | Admitting: Podiatry

## 2018-11-18 ENCOUNTER — Ambulatory Visit (INDEPENDENT_AMBULATORY_CARE_PROVIDER_SITE_OTHER): Payer: Medicare Other

## 2018-11-18 ENCOUNTER — Other Ambulatory Visit: Payer: Self-pay

## 2018-11-18 ENCOUNTER — Encounter: Payer: Self-pay | Admitting: Podiatry

## 2018-11-18 DIAGNOSIS — M7751 Other enthesopathy of right foot: Secondary | ICD-10-CM | POA: Diagnosis not present

## 2018-11-18 DIAGNOSIS — M2041 Other hammer toe(s) (acquired), right foot: Secondary | ICD-10-CM | POA: Diagnosis not present

## 2018-11-18 DIAGNOSIS — T847XXA Infection and inflammatory reaction due to other internal orthopedic prosthetic devices, implants and grafts, initial encounter: Secondary | ICD-10-CM | POA: Diagnosis not present

## 2018-11-18 NOTE — Progress Notes (Signed)
She presents today complaining of pain to the second third and fourth toes of the right foot.  States that the toes rub together and cause blisters and sometimes they bleed.  Objective: Vital signs are stable she is alert and oriented x3 pulses are palpable.  There is no bleeding to the toes today however I do see how the distal aspect of the second toe wavers slightly laterally as well as some thickening of the toes #2 and #3.  Radiographs demonstrate screws retained to toes #3 #4 which are tender on palpation.  Assessment: Painful internal fixation mild hammertoe deformity #2.  Plan: Discussed etiology pathology and surgical therapies at this point time consist consented her for removal of internal fixation screws to toes #3 #4 the left foot.  And also shortening with arthroplasty second digit.  She understands this is amenable to it we did discuss the possible postop complication she is amendable to it we will follow-up with me in the near future for surgical intervention.  We dispensed a Cam walker.

## 2018-11-18 NOTE — Patient Instructions (Signed)
Pre-Operative Instructions  Congratulations, you have decided to take an important step towards improving your quality of life.  You can be assured that the doctors and staff at [facility] will be with you every step of the way.  Here are some important things you should know:  1. Plan to be at the surgery center/hospital at least 1 (one) hour prior to your scheduled time, unless otherwise directed by the surgical center/hospital staff.  You must have a responsible adult accompany you, remain during the surgery and drive you home.  Make sure you have directions to the surgical center/hospital to ensure you arrive on time. 2. If you are having surgery at [facility] or [facility], you will need a copy of your medical history and physical form from your family physician within one month prior to the date of surgery. We will give you a form for your primary physician to complete.  3. We make every effort to accommodate the date you request for surgery.  However, there are times where surgery dates or times have to be moved.  We will contact you as soon as possible if a change in schedule is required.   4. No aspirin/ibuprofen for one week before surgery.  If you are on aspirin, any non-steroidal anti-inflammatory medications (Mobic, Aleve, Ibuprofen) should not be taken seven (7) days prior to your surgery.  You make take Tylenol for pain prior to surgery.  5. Medications - If you are taking daily heart and blood pressure medications, seizure, reflux, allergy, asthma, anxiety, pain or diabetes medications, make sure you notify the surgery center/hospital before the day of surgery so they can tell you which medications you should take or avoid the day of surgery. 6. No food or drink after midnight the night before surgery unless directed otherwise by surgical center/hospital staff. 7. No alcoholic beverages 24-hours prior to surgery.  No smoking 24-hours prior or 24-hours after  surgery. 8. Wear loose pants or shorts. They should be loose enough to fit over bandages, boots, and casts. 9. Don't wear slip-on shoes. Sneakers are preferred. 10. Bring your boot with you to the surgery center/hospital.  Also bring crutches or a walker if your physician has prescribed it for you.  If you do not have this equipment, it will be provided for you after surgery. 11. If you have not been contacted by the surgery center/hospital by the day before your surgery, call to confirm the date and time of your surgery. 12. Leave-time from work may vary depending on the type of surgery you have.  Appropriate arrangements should be made prior to surgery with your employer. 13. Prescriptions will be provided immediately following surgery by your doctor.  Fill these as soon as possible after surgery and take the medication as directed. Pain medications will not be refilled on weekends and must be approved by the doctor. 14. Remove nail polish on the operative foot and avoid getting pedicures prior to surgery. 15. Wash the night before surgery.  The night before surgery wash the foot and leg well with water and the antibacterial soap provided. Be sure to pay special attention to beneath the toenails and in between the toes.  Wash for at least three (3) minutes. Rinse thoroughly with water and dry well with a towel.  Perform this wash unless told not to do so by your physician.  Enclosed: 1 Ice pack (please put in freezer the night before surgery)   1 Hibiclens skin cleaner     Pre-op instructions  If you have any questions regarding the instructions, please do not hesitate to call our office.  [place]: [address], [place], [place] [address] -- [phone]  [place]: [address] [place], [place] [address] -- [phone]  [place]: [address]  [place],  [address] -- [phone]   Website: [email or web address] 

## 2018-11-24 ENCOUNTER — Telehealth: Payer: Self-pay | Admitting: Podiatry

## 2018-11-24 NOTE — Telephone Encounter (Signed)
DOS: 12/18/2018  SURGICAL PROCEDURES: Removal Fixation Deep Kwire/Screw 3rd and 4th toes [phone]), and Arthroplasty DiPj (shorten) 2nd [phone]).  [employer] Medicare Effective Date: 06/02/2018 - 01/01/2019  Notification or Prior Authorization is not required for the requested services  This [facility] Advant[employer] members plan does not currently require a prior authorization for these services. If you have general questions about the prior authorization requirements, please call us at [phone] or visit [email or web address] > Clinician Resources > Advance and Admission Notification Requirements. The number above acknowledges your notification. Please write this number down for future reference. Notification is not a guarantee of coverage or payment.  Decision ID [id number]  The number above acknowledges your inquiry and our response. Please write this number down and refer to it for future inquiries. Coverage and payment for an item or service is governed by the member's benefit plan document, and, if applicable, the provider's participation agreement with the Health Plan.

## 2018-11-25 ENCOUNTER — Other Ambulatory Visit: Payer: Self-pay

## 2018-11-25 DIAGNOSIS — Z20822 Contact with and (suspected) exposure to covid-19: Secondary | ICD-10-CM

## 2018-11-26 LAB — NOVEL CORONAVIRUS, NAA: SARS-CoV-2, NAA: NOT DETECTED

## 2018-12-12 ENCOUNTER — Other Ambulatory Visit: Payer: Self-pay | Admitting: Family Medicine

## 2018-12-17 ENCOUNTER — Other Ambulatory Visit: Payer: Self-pay | Admitting: Podiatry

## 2018-12-17 MED ORDER — CEPHALEXIN 500 MG PO CAPS: 500.0000 mg | ORAL_CAPSULE | Freq: Three times a day (TID) | ORAL | 0 refills | Status: DC

## 2018-12-17 MED ORDER — OXYCODONE-ACETAMINOPHEN 10-325 MG PO TABS: 1.0000 | ORAL_TABLET | Freq: Three times a day (TID) | ORAL | 0 refills | Status: AC | PRN

## 2018-12-17 MED ORDER — ONDANSETRON HCL 4 MG PO TABS: 4.0000 mg | ORAL_TABLET | Freq: Three times a day (TID) | ORAL | 0 refills | Status: DC | PRN

## 2018-12-18 ENCOUNTER — Telehealth: Payer: Self-pay | Admitting: *Deleted

## 2018-12-18 ENCOUNTER — Encounter: Payer: Self-pay | Admitting: Podiatry

## 2018-12-18 DIAGNOSIS — M2041 Other hammer toe(s) (acquired), right foot: Secondary | ICD-10-CM

## 2018-12-18 DIAGNOSIS — Z4889 Encounter for other specified surgical aftercare: Secondary | ICD-10-CM

## 2018-12-18 NOTE — Telephone Encounter (Signed)
I informed [facility] - Melissa [name] pt had surgery today and diagnosis is capsulitis.

## 2018-12-18 NOTE — Telephone Encounter (Signed)
[facility] - Robert request diagnosis for oxycodone order and if she is a post op pt.

## 2018-12-23 ENCOUNTER — Encounter: Payer: Self-pay | Admitting: Podiatry

## 2018-12-23 ENCOUNTER — Ambulatory Visit (INDEPENDENT_AMBULATORY_CARE_PROVIDER_SITE_OTHER): Payer: Medicare Other | Admitting: Podiatry

## 2018-12-23 ENCOUNTER — Ambulatory Visit (INDEPENDENT_AMBULATORY_CARE_PROVIDER_SITE_OTHER): Payer: Medicare Other

## 2018-12-23 ENCOUNTER — Other Ambulatory Visit: Payer: Self-pay

## 2018-12-23 DIAGNOSIS — M2041 Other hammer toe(s) (acquired), right foot: Secondary | ICD-10-CM

## 2018-12-23 DIAGNOSIS — T847XXD Infection and inflammatory reaction due to other internal orthopedic prosthetic devices, implants and grafts, subsequent encounter: Secondary | ICD-10-CM

## 2018-12-23 DIAGNOSIS — Z9889 Other specified postprocedural states: Secondary | ICD-10-CM

## 2018-12-23 NOTE — Progress Notes (Signed)
She denies fever chills nausea vomiting muscle aches and pains presents today for first postop visit date of surgery 12/18/2018 removal of internal fixation toes #3 and four of the right foot with a DIPJ arthroplasty second digit right foot.  Objective: Dry sterile dressing intact Cam walker intact.  Was removed demonstrates no erythema edema cellulitis drainage or odor toes are rectus appear to be healing very nicely.  Assessment: Well-healing surgical toes.  Plan: Follow-up with her in 1 to 2 weeks.

## 2018-12-28 NOTE — Progress Notes (Signed)
Virtual Visit via Video Note  I connected with Melissa [name] on 12/28/18 at  4:15 PM EST by a video enabled telemedicine application and verified that I am speaking with the correct person using two identifiers.  Location: Patient: Patient is in home setting some children in the background playing Provider: In office setting   I discussed the limitations of evaluation and management by telemedicine and the availability of in person appointments. The patient expressed understanding and agreed to proceed.  History of Present Illness: 43 year old patient who has had more of a polyarthralgia for some time including some neck pain.  Patient states that the low dose of the Synthroid has been significantly help for more helpful than usual.  Had run out of the meloxicam which she was using is more of a breakthrough pain.  Patient changed from her daily vitamin D and feels that since she is not on the once weekly anymore she is not making as much improvement.  Waiting to see if there is something else that could help with some of the improvement.    Observations/Objective: Alert and oriented x3, patient appears to be comfortable.   Assessment and Plan: 43 year old female with polyarthralgia responding to a very low dose of the Synthroid.  Discussed with patient in great length refill of meloxicam and patient vitamin D.  We have increased patient Synthroid to 75 mcg and warned of potential side effects.  Would not want to go higher likely.  Would need laboratory work-up otherwise.  Patient is in agreement with the plan   Follow Up Instructions: Possibly 4 weeks virtually or if she feels more comfortable coming to the office.    I discussed the assessment and treatment plan with the patient. The patient was provided an opportunity to ask questions and all were answered. The patient agreed with the plan and demonstrated an understanding of the instructions.   The patient was advised to call back or  seek an in-person evaluation if the symptoms worsen or if the condition fails to improve as anticipated.  I provided 25 minutes of face-to-face time during this encounter. Did have difficulty with the vendor virtual [place], DO

## 2018-12-29 ENCOUNTER — Ambulatory Visit (INDEPENDENT_AMBULATORY_CARE_PROVIDER_SITE_OTHER): Payer: Medicare Other | Admitting: Family Medicine

## 2018-12-29 ENCOUNTER — Encounter: Payer: Self-pay | Admitting: Family Medicine

## 2018-12-29 DIAGNOSIS — E559 Vitamin D deficiency, unspecified: Secondary | ICD-10-CM | POA: Diagnosis not present

## 2018-12-29 DIAGNOSIS — M2550 Pain in unspecified joint: Secondary | ICD-10-CM | POA: Diagnosis not present

## 2018-12-29 MED ORDER — VITAMIN D (ERGOCALCIFEROL) 1.25 MG (50000 UNIT) PO CAPS: 50000.0000 [IU] | ORAL_CAPSULE | ORAL | 0 refills | Status: DC

## 2018-12-29 MED ORDER — MELOXICAM 15 MG PO TABS: 15.0000 mg | ORAL_TABLET | Freq: Every day | ORAL | 0 refills | Status: DC

## 2018-12-29 MED ORDER — LEVOTHYROXINE SODIUM 75 MCG PO TABS: 75.0000 ug | ORAL_TABLET | Freq: Every day | ORAL | 3 refills | Status: DC

## 2018-12-31 ENCOUNTER — Encounter: Payer: Self-pay | Admitting: Family Medicine

## 2019-01-01 ENCOUNTER — Ambulatory Visit (INDEPENDENT_AMBULATORY_CARE_PROVIDER_SITE_OTHER): Payer: Medicare Other | Admitting: Podiatry

## 2019-01-01 ENCOUNTER — Encounter: Payer: Self-pay | Admitting: Podiatry

## 2019-01-01 ENCOUNTER — Other Ambulatory Visit: Payer: Self-pay

## 2019-01-01 DIAGNOSIS — Z9889 Other specified postprocedural states: Secondary | ICD-10-CM

## 2019-01-01 NOTE — Progress Notes (Signed)
She presents today 2 weeks status post surgery to toes 2 3 and 4 of the right foot.  She states they are doing just fine.  Sutures are in not bothering her at all.  Objective: Vital signs are stable she is alert and oriented x3 pulses are palpable no open lesions or wounds margins well coapted sutures are intact sutures were removed today margins remain well coapted there is no signs of infection.  Assessment: Well-healing surgical toes.  Plan: I went ahead and wrapped the toes with Coban today and she will continue to do so continue to wear the Darco shoe and I will follow-up with her in 2 weeks at which time we will try to progress to a regular shoe.

## 2019-01-15 ENCOUNTER — Other Ambulatory Visit: Payer: Self-pay

## 2019-01-15 ENCOUNTER — Ambulatory Visit (INDEPENDENT_AMBULATORY_CARE_PROVIDER_SITE_OTHER): Payer: Medicare Other

## 2019-01-15 ENCOUNTER — Encounter: Payer: Self-pay | Admitting: Podiatry

## 2019-01-15 ENCOUNTER — Ambulatory Visit (INDEPENDENT_AMBULATORY_CARE_PROVIDER_SITE_OTHER): Payer: Self-pay | Admitting: Podiatry

## 2019-01-15 DIAGNOSIS — M2041 Other hammer toe(s) (acquired), right foot: Secondary | ICD-10-CM

## 2019-01-15 DIAGNOSIS — Z9889 Other specified postprocedural states: Secondary | ICD-10-CM

## 2019-01-18 NOTE — Progress Notes (Signed)
She presents today date of surgery December 18, 2018 removal fixation deep right foot with hammertoe repair 3 and 4 right and repair of the second with an arthroplasty. She states that they are feeling good everything is looking much better and feeling much better. She denies fever chills nausea vomiting muscle aches pains calf pain back pain chest pain shortness of breath.  Objective: Toes are rectus there is no erythema minimal edema no cellulitis drainage or odor  Assessment well-healing surgical toes.  Plan: Continue to wrap the toes with Coban for another month just during the day and I will follow-up with her in about 6 weeks.

## 2019-01-29 ENCOUNTER — Encounter: Payer: Medicare Other | Admitting: Podiatry

## 2019-01-30 ENCOUNTER — Ambulatory Visit: Payer: Medicare Other | Attending: Internal Medicine

## 2019-01-30 DIAGNOSIS — Z20822 Contact with and (suspected) exposure to covid-19: Secondary | ICD-10-CM

## 2019-01-31 LAB — NOVEL CORONAVIRUS, NAA: SARS-CoV-2, NAA: NOT DETECTED

## 2019-02-05 ENCOUNTER — Ambulatory Visit: Payer: Medicare Other | Admitting: Family Medicine

## 2019-02-06 ENCOUNTER — Other Ambulatory Visit: Payer: Self-pay

## 2019-02-06 ENCOUNTER — Telehealth: Payer: Self-pay | Admitting: Family Medicine

## 2019-02-06 MED ORDER — NAPROXEN 500 MG PO TABS: 500.0000 mg | ORAL_TABLET | Freq: Two times a day (BID) | ORAL | 0 refills | Status: DC | PRN

## 2019-02-06 NOTE — Telephone Encounter (Signed)
Rx filled via verbal from Dr. Tamala Julian.

## 2019-02-06 NOTE — Telephone Encounter (Signed)
Patient called asking if Dr Tamala Julian would be willing to send in a prescription for Naproxen 500mg . She said that she was seen at an UC while out of town and they prescribed this for her back pain. It has really helped her and she would like to continue it if possible. Please advise.  Ph[facility]: [facility] [address]

## 2019-02-09 ENCOUNTER — Ambulatory Visit: Payer: Medicare Other | Admitting: Family Medicine

## 2019-02-16 ENCOUNTER — Ambulatory Visit: Payer: Medicare Other | Attending: Internal Medicine

## 2019-02-16 DIAGNOSIS — Z20822 Contact with and (suspected) exposure to covid-19: Secondary | ICD-10-CM | POA: Diagnosis not present

## 2019-02-17 LAB — NOVEL CORONAVIRUS, NAA: SARS-CoV-2, NAA: NOT DETECTED

## 2019-02-20 ENCOUNTER — Ambulatory Visit: Payer: Medicare Other | Admitting: Family

## 2019-02-20 ENCOUNTER — Ambulatory Visit: Payer: Medicare Other | Admitting: Family Medicine

## 2019-02-26 ENCOUNTER — Other Ambulatory Visit: Payer: Self-pay

## 2019-02-26 ENCOUNTER — Ambulatory Visit (INDEPENDENT_AMBULATORY_CARE_PROVIDER_SITE_OTHER): Payer: Medicare Other | Admitting: Podiatry

## 2019-02-26 ENCOUNTER — Ambulatory Visit (INDEPENDENT_AMBULATORY_CARE_PROVIDER_SITE_OTHER): Payer: Medicare Other

## 2019-02-26 DIAGNOSIS — L603 Nail dystrophy: Secondary | ICD-10-CM | POA: Diagnosis not present

## 2019-02-26 DIAGNOSIS — L609 Nail disorder, unspecified: Secondary | ICD-10-CM | POA: Diagnosis not present

## 2019-02-26 DIAGNOSIS — M722 Plantar fascial fibromatosis: Secondary | ICD-10-CM | POA: Diagnosis not present

## 2019-02-26 DIAGNOSIS — M2041 Other hammer toe(s) (acquired), right foot: Secondary | ICD-10-CM | POA: Diagnosis not present

## 2019-02-26 DIAGNOSIS — Z9889 Other specified postprocedural states: Secondary | ICD-10-CM

## 2019-02-26 NOTE — Progress Notes (Signed)
She presents today concerned about a hallux nail plate that is discolored along the fibular border of the hallux left.  She is also concerned about pain to her left heel.  Objective: Vital signs are stable alert and oriented x3.  Pulses are palpable.  She has a thick dystrophic clinically mycotic nail hallux left along the fibular border in particular.  She also has pain on palpation medial calcaneal tubercle.  Assessment: History of plantar fasciitis left.  Nail dystrophy hallux left.  Plan: Samples of the nail and skin were taken today to be sent for pathologic evaluation I also injected her left heel today with 20 mg Kenalog 5 mg Marcaine to the point of maximal tenderness.  She tolerated procedure well without complications follow-up with her in 1 month

## 2019-02-27 ENCOUNTER — Ambulatory Visit: Payer: Medicare Other | Admitting: Family

## 2019-02-27 ENCOUNTER — Ambulatory Visit (INDEPENDENT_AMBULATORY_CARE_PROVIDER_SITE_OTHER): Payer: Medicare Other | Admitting: Family Medicine

## 2019-02-27 ENCOUNTER — Other Ambulatory Visit: Payer: Self-pay

## 2019-02-27 ENCOUNTER — Other Ambulatory Visit (INDEPENDENT_AMBULATORY_CARE_PROVIDER_SITE_OTHER): Payer: Medicare Other

## 2019-02-27 VITALS — BP 118/82 | HR 94 | Ht 61.0 in | Wt 150.0 lb

## 2019-02-27 DIAGNOSIS — M5412 Radiculopathy, cervical region: Secondary | ICD-10-CM | POA: Diagnosis not present

## 2019-02-27 DIAGNOSIS — M9983 Other biomechanical lesions of lumbar region: Secondary | ICD-10-CM | POA: Diagnosis not present

## 2019-02-27 DIAGNOSIS — M2550 Pain in unspecified joint: Secondary | ICD-10-CM

## 2019-02-27 DIAGNOSIS — M999 Biomechanical lesion, unspecified: Secondary | ICD-10-CM | POA: Diagnosis not present

## 2019-02-27 DIAGNOSIS — M2559 Pain in other specified joint: Secondary | ICD-10-CM

## 2019-02-27 LAB — COMPREHENSIVE METABOLIC PANEL
ALT: 91 U/L — ABNORMAL HIGH (ref 0–35)
AST: 25 U/L (ref 0–37)
Albumin: 4.1 g/dL (ref 3.5–5.2)
Alkaline Phosphatase: 69 U/L (ref 39–117)
BUN: 14 mg/dL (ref 6–23)
CO2: 28 mEq/L (ref 19–32)
Calcium: 9.5 mg/dL (ref 8.4–10.5)
Chloride: 102 mEq/L (ref 96–112)
Creatinine, Ser: 0.63 mg/dL (ref 0.40–1.20)
GFR: 103 mL/min (ref >60.00)
Glucose, Bld: 106 mg/dL — ABNORMAL HIGH (ref 70–99)
Potassium: 3.3 mEq/L — ABNORMAL LOW (ref 3.5–5.1)
Sodium: 136 mEq/L (ref 135–145)
Total Bilirubin: 0.4 mg/dL (ref 0.2–1.2)
Total Protein: 7.6 g/dL (ref 6.0–8.3)

## 2019-02-27 LAB — IBC PANEL
Iron: 60 ug/dL (ref 42–145)
Saturation Ratios: 15.8 % — ABNORMAL LOW (ref 20.0–50.0)
Transferrin: 271 mg/dL (ref 212.0–360.0)

## 2019-02-27 LAB — CBC WITH DIFFERENTIAL/PLATELET
Basophils Absolute: 0 10*3/uL (ref 0.0–0.1)
Basophils Relative: 0.5 % (ref 0.0–3.0)
Eosinophils Absolute: 0 10*3/uL (ref 0.0–0.7)
Eosinophils Relative: 0.3 % (ref 0.0–5.0)
HCT: 38.8 % (ref 36.0–46.0)
Hemoglobin: 13.3 g/dL (ref 12.0–15.0)
Lymphocytes Relative: 24.9 % (ref 12.0–46.0)
Lymphs Abs: 2.4 10*3/uL (ref 0.7–4.0)
MCHC: 34.3 g/dL (ref 30.0–36.0)
MCV: 91.7 fl (ref 78.0–100.0)
Monocytes Absolute: 0.8 10*3/uL (ref 0.1–1.0)
Monocytes Relative: 8 % (ref 3.0–12.0)
Neutro Abs: 6.4 10*3/uL (ref 1.4–7.7)
Neutrophils Relative %: 66.3 % (ref 43.0–77.0)
Platelets: 291 10*3/uL (ref 150.0–400.0)
RBC: 4.23 Mil/uL (ref 3.87–5.11)
RDW: 12.5 % (ref 11.5–15.5)
WBC: 9.6 10*3/uL (ref 4.0–10.5)

## 2019-02-27 LAB — FERRITIN: Ferritin: 32.5 ng/mL (ref 10.0–291.0)

## 2019-02-27 LAB — SEDIMENTATION RATE: Sed Rate: 28 mm/hr — ABNORMAL HIGH (ref 0–20)

## 2019-02-27 LAB — URIC ACID: Uric Acid, Serum: 1.9 mg/dL — ABNORMAL LOW (ref 2.4–7.0)

## 2019-02-27 LAB — VITAMIN D 25 HYDROXY (VIT D DEFICIENCY, FRACTURES): VITD: 33.61 ng/mL (ref 30.00–100.00)

## 2019-02-27 LAB — TSH: TSH: 1.41 u[IU]/mL (ref 0.35–4.50)

## 2019-02-27 LAB — C-REACTIVE PROTEIN: CRP: <1 mg/dL (ref 0.5–20.0)

## 2019-02-27 MED ORDER — KETOROLAC TROMETHAMINE 60 MG/2ML IM SOLN
60.0000 mg | Freq: Once | INTRAMUSCULAR | Status: AC
  Administered 2019-02-27: 60 mg via INTRAMUSCULAR

## 2019-02-27 MED ORDER — METHYLPREDNISOLONE ACETATE 80 MG/ML IJ SUSP
80.0000 mg | Freq: Once | INTRAMUSCULAR | Status: AC
  Administered 2019-02-27: 80 mg via INTRAMUSCULAR

## 2019-02-27 NOTE — Patient Instructions (Signed)
Labs today Injections in back side today See me in 5-6 weeks

## 2019-02-27 NOTE — Progress Notes (Signed)
[place] Phone: [phone] Subjective:   Melissa [name], am serving as a scribe for Dr. Hulan Saas. This visit occurred during the SARS-CoV-2 public health emergency.  Safety protocols were in place, including screening questions prior to the visit, additional usage of staff PPE, and extensive cleaning of exam room while observing appropriate contact time as indicated for disinfecting solutions.   I'm seeing this patient by the request  of:  Marrian Salvage, FNP  CC: Polyarthralgia follow-up  [id number]   12/29/2018 44 year old female with polyarthralgia responding to a very low dose of the Synthroid.  Discussed with patient in great length refill of meloxicam and patient vitamin D.  We have increased patient Synthroid to 75 mcg and warned of potential side effects.  Would not want to go higher likely.  Would need laboratory work-up otherwise.  Patient is in agreement with the plan  Update 02/27/2019 Melissa [name] is a 44 y.o. female coming in with complaint of polyarthralgia. Patient states that she has been having right sided neck and shoulder pain. Is having tingling and pain into the right hand. Has continued to use Synthroid. Uses IBU prn. Feeling pain throughout her body.  Patient states mostly seems to be in her neck at the moment.  Seems to be worsening slowly.  Rates the severity of pain is 8 out of 10.     Past Medical History:  Diagnosis Date  . Anxiety   . Arthritis   . Carpal tunnel syndrome   . Depression   . Hammer toe 04/2013   bilateral 4th and 5th   . Ingrown left big toenail 04/2013  . Mental disorder   . Neuromuscular disorder ([place])   . Post term pregnancy over 40 weeks 09/21/2015  . SVD (spontaneous vaginal delivery) 09/22/2015  . UTI (lower urinary tract infection)    Past Surgical History:  Procedure Laterality Date  . BREAST SURGERY    . CORRECTION HAMMER TOE  Bilateral   . CRYOABLATION  2004  . CYSTOSCOPY WITH HYDRODISTENSION AND BIOPSY  07/28/2004   with urethral dilatation  . DILATION AND CURETTAGE OF UTERUS    . DILATION AND EVACUATION  12/21/2011   Procedure: DILATATION AND EVACUATION;  Surgeon: Terrance Mass, MD;  Location: [facility];  Service: Gynecology;  Laterality: N/A;  first trimester   . HAMMER TOE SURGERY Bilateral 04/20/2013   Procedure: BILATERAL HAMMER TOE REPAIR AND EXCISION NAIL PERMANENT LEFT FIRST;  Surgeon: Harriet Masson, DPM;  Location: [facility];  Service: Podiatry;  Laterality: Bilateral;  Hammer toe repair fourth and fifth toes bilateral feet with screw placement fourth toes and partial nail excision left great toe  . HYSTEROSALPINGOGRAM    . TOE SURGERY     ingrown toenail  . WISDOM TOOTH EXTRACTION     Social History   Socioeconomic History  . Marital status: Single    Spouse name: Not on file  . Number of children: Not on file  . Years of education: Not on file  . Highest education level: Not on file  Occupational History  . Not on file  Tobacco Use  . Smoking status: Never Smoker  . Smokeless tobacco: Never Used  Substance and Sexual Activity  . Alcohol use: [name]    Comment: occasionally  . Drug use: [name]  . Sexual activity: Yes    Birth control/protection: I.U.D.  Other Topics Concern  . Not on  file  Social History Narrative  . Not on file   Social Determinants of Health   Financial Resource Strain:   . Difficulty of Paying Living Expenses: Not on file  Food Insecurity:   . Worried About Charity fundraiser in the Last Year: Not on file  . Ran Out of Food in the Last Year: Not on file  Transportation Needs:   . Lack of Transportation (Medical): Not on file  . Lack of Transportation (Non-Medical): Not on file  Physical Activity:   . Days of Exercise per Week: Not on file  . Minutes of Exercise per Session: Not on file  Stress:   . Feeling of Stress : Not on  file  Social Connections:   . Frequency of Communication with Friends and Family: Not on file  . Frequency of Social Gatherings with Friends and Family: Not on file  . Attends Religious Services: Not on file  . Active Member of Clubs or Organizations: Not on file  . Attends Archivist Meetings: Not on file  . Marital Status: Not on file   [name] Known Allergies Family History  Problem Relation Age of Onset  . Cancer Father        lung cancer  . Birth defects Paternal Grandfather   . Seizures Mother   . Arthritis Mother   . Thyroid disease Maternal Grandmother   . Heart disease Paternal Grandmother        2 CABG    Current Outpatient Medications (Endocrine & Metabolic):  .  levonorgestrel (MIRENA) 20 MCG/24HR IUD, 1 each by Intrauterine route once. Marland Kitchen  levothyroxine (SYNTHROID) 75 MCG tablet, Take 1 tablet (75 mcg total) by mouth daily before breakfast.   Current Outpatient Medications (Respiratory):  .  fluticasone (FLONASE) 50 MCG/ACT nasal spray, Use 2 spray(s) in each nostril once daily  Current Outpatient Medications (Analgesics):  .  ibuprofen (ADVIL) 800 MG tablet, Take 1 tablet by mouth twice daily .  meloxicam (MOBIC) 15 MG tablet, Take 1 tablet (15 mg total) by mouth daily. .  naproxen (NAPROSYN) 500 MG tablet, Take 1 tablet (500 mg total) by mouth 2 (two) times daily as needed.  Current Outpatient Medications (Hematological):  .  tranexamic acid (LYSTEDA) 650 MG TABS tablet, Take 650 mg by mouth daily.  Current Outpatient Medications (Other):  .  amphetamine-dextroamphetamine (ADDERALL) 20 MG tablet, TAKE 1 TABLET BY MOUTH IN THE MORNING AND 1 EVERYDAY AT NOON AND 1 TABLET AT 4PM .  buPROPion (WELLBUTRIN XL) 150 MG 24 hr tablet, TAKE 1 TABLET BY MOUTH ONCE A DAY FOR 7 DAYS THEN 2 TABLETS IN EVERDAY FOR 7 DAYS THEN 3 TABLETS BY MOUTH EVERYDAY THEREAFTER .  cephALEXin (KEFLEX) 500 MG capsule, Take 1 capsule (500 mg total) by mouth 3 (three) times daily. .   clobetasol (TEMOVATE) 0.05 % external solution, APPLY SOLUTION TOPICALLY TO AFFECTED AREA UP TO TWICE DAILY AS NEEDED (NOT TO FACE GROIN AND AXILLA) .  escitalopram (LEXAPRO) 20 MG tablet, Take 20 mg by mouth every morning. .  fluticasone (CUTIVATE) 0.05 % cream, fluticasone propionate 0.05 % topical cream .  gabapentin (NEURONTIN) 100 MG capsule, Take 1 capsule (100 mg total) by mouth at bedtime. .  hydrOXYzine (ATARAX/VISTARIL) 10 MG tablet, Take 1 tablet (10 mg total) by mouth 3 (three) times daily as needed for anxiety. .  ondansetron (ZOFRAN) 4 MG tablet, Take 1 tablet (4 mg total) by mouth every 8 (eight) hours as needed. .  Salicylic Acid  3 % SHAM, Use daily as directed for dry scalp .  selenium sulfide (SELSUN) 2.5 % shampoo, USE  LOTION EXTERNALLY TO AFFECTED AREA ONCE DAILY AS NEEDED FOR  IRRITATION .  venlafaxine XR (EFFEXOR-XR) 150 MG 24 hr capsule, Take 1 capsule (150 mg total) by mouth daily with breakfast. .  Vitamin D, Ergocalciferol, (DRISDOL) 1.25 MG (50000 UT) CAPS capsule, Take 1 capsule (50,000 Units total) by mouth every 7 (seven) days.   Reviewed prior external information including notes and imaging from  primary care provider As well as notes that were available from care everywhere and other healthcare systems.  Past medical history, social, surgical and family history all reviewed in electronic medical record.  [name] pertanent information unless stated regarding to the chief complaint.   Review of Systems:  [name]  visual changes, nausea, vomiting, diarrhea, constipation, dizziness, abdominal pain, skin rash, fevers, chills, night sweats, weight loss, swollen lymph nodes, , joint swelling, chest pain, shortness of breath, mood changes. POSITIVE muscle aches, body aches, headache  Objective  Blood pressure 118/82, pulse 94, height 5\' 1"  (1.549 m), weight 150 lb (68 kg), SpO2 99 %, unknown if currently breastfeeding.   General: [name] apparent distress alert and oriented x3 mood  and affect normal, dressed appropriately.  HEENT: Pupils equal, extraocular movements intact  Respiratory: Patient's speak in full sentences and does not appear short of breath  Cardiovascular: [name] lower extremity edema, non tender, [name] erythema  Skin: Warm dry intact with [name] signs of infection or rash on extremities or on axial skeleton.  Abdomen: Soft nontender  Neuro: Cranial nerves II through XII are intact, neurovascularly intact in all extremities with 2+ DTRs and 2+ pulses.  Lymph: [name] lymphadenopathy of posterior or anterior cervical chain or axillae bilaterally.  Gait normal with good balance and coordination.  MSK: Pain is out of proportion to the amount of palpation.  Describes the pain as a dull aching pain when touched.  Seems to be in multiple areas.  Most the pain seems to be in the parascapular region today.  Patient does have pain going up the neck as well.  Describes it as a dull, throbbing aching sensation.  Negative Spurling's.  5 out of 5 grip strength noted.  Osteopathic findings  C6 flexed rotated and side bent left T3 extended rotated and side bent right inhaled third rib T7 extended rotated and side bent left L2 flexed rotated and side bent right Sacrum right on right     Impression and Recommendations:     This case required medical decision making of moderate complexity. The above documentation has been reviewed and is accurate and complete Lyndal Pulley, DO       Note: This dictation was prepared with Dragon dictation along with smaller phrase technology. Any transcriptional errors that result from this process are unintentional.

## 2019-03-01 ENCOUNTER — Encounter: Payer: Self-pay | Admitting: Family Medicine

## 2019-03-01 DIAGNOSIS — M999 Biomechanical lesion, unspecified: Secondary | ICD-10-CM | POA: Insufficient documentation

## 2019-03-01 NOTE — Assessment & Plan Note (Signed)
Continues to have neck pain.  Chronic problem with exacerbation.  Discussed posture and ergonomics, discussed which activities to do which wants to avoid.  Patient is to increase activity slowly.  Laboratory work-up secondary to the polyarthralgia.  Underlying bipolar depression is a social determinant of health that can contribute to why patient's pain seems to wax and wane.  Patient will follow up with me again in 4 to 6 weeks

## 2019-03-01 NOTE — Assessment & Plan Note (Signed)
Decision today to treat with OMT was based on Physical Exam  After verbal consent patient was treated with  ME, FPR techniques in cervical, thoracic, rib, lumbar and sacral areas  Patient tolerated the procedure well with improvement in symptoms  Patient given exercises, stretches and lifestyle modifications  See medications in patient instructions if given  Patient will follow up in 4-8 weeks

## 2019-03-02 ENCOUNTER — Ambulatory Visit (INDEPENDENT_AMBULATORY_CARE_PROVIDER_SITE_OTHER): Payer: Medicare Other | Admitting: Family

## 2019-03-02 ENCOUNTER — Ambulatory Visit: Payer: Medicare Other | Admitting: Family

## 2019-03-02 ENCOUNTER — Encounter: Payer: Self-pay | Admitting: Family

## 2019-03-02 ENCOUNTER — Encounter: Payer: Self-pay | Admitting: Gastroenterology

## 2019-03-02 ENCOUNTER — Other Ambulatory Visit: Payer: Self-pay

## 2019-03-02 VITALS — BP 112/76 | Ht 61.0 in | Wt 150.0 lb

## 2019-03-02 DIAGNOSIS — K219 Gastro-esophageal reflux disease without esophagitis: Secondary | ICD-10-CM | POA: Diagnosis not present

## 2019-03-02 DIAGNOSIS — R1310 Dysphagia, unspecified: Secondary | ICD-10-CM

## 2019-03-02 LAB — ANA: Anti Nuclear Antibody (ANA): NEGATIVE

## 2019-03-02 LAB — CYCLIC CITRUL PEPTIDE ANTIBODY, IGG: Cyclic Citrullin Peptide Ab: <16 UNITS

## 2019-03-02 LAB — PTH, INTACT AND CALCIUM
Calcium: 9.4 mg/dL (ref 8.6–10.2)
PTH: 17 pg/mL (ref 14–64)

## 2019-03-02 LAB — RHEUMATOID FACTOR: Rheumatoid fact SerPl-aCnc: <14 IU/mL (ref <14)

## 2019-03-02 LAB — CALCIUM, IONIZED: Calcium, Ion: 4.9 mg/dL (ref 4.8–5.6)

## 2019-03-02 LAB — ANGIOTENSIN CONVERTING ENZYME: Angiotensin-Converting Enzyme: 20 U/L (ref 9–67)

## 2019-03-02 MED ORDER — PANTOPRAZOLE SODIUM 40 MG PO TBEC: 40.0000 mg | DELAYED_RELEASE_TABLET | Freq: Every day | ORAL | 3 refills | Status: DC

## 2019-03-02 NOTE — Progress Notes (Signed)
Melissa [name] is a 44 y.o. female with the following history as recorded in EpicCare:  Patient Active Problem List   Diagnosis Date Noted  . Nonallopathic lesion of lumbosacral region 03/01/2019  . Nonallopathic lesion of cervical region 03/01/2019  . Nonallopathic lesion of sacral region 03/01/2019  . Nonallopathic lesion of thoracic region 03/01/2019  . Nonallopathic lesion of rib cage 85/28/2021  . Vitamin D deficiency 05/08/2018  . Neck pain 06/17/2017  . Right lateral epicondylitis 04/15/2017  . Piriformis syndrome, left 02/25/2017  . Vaginal discharge 09/11/2016  . Tendinitis of extensor tendon of right hand 06/11/2016  . Mixed stress and urge urinary incontinence 02/07/2016  . Carpal tunnel syndrome 01/03/2016  . Polyarthralgia 01/03/2016  . Galactocele associated with childbirth 11/07/2015  . Pregnancy with poor obstetric history 02/21/2015  . Multigravida of advanced maternal age 85/20/2017  . Radial tunnel syndrome 06/17/2014  . Cervical radiculopathy 06/10/2014  . Bilateral low back pain with right-sided sciatica 06/10/2014  . Bipolar depression ([place]) 05/17/2014  . Chronic night sweats 06/26/2013  . History of recurrent spontaneous abortion, not currently pregnant 01/26/2012  . Fluid retention 01/25/2012  . Obesity (BMI 30.0-34.9) 01/25/2012    Current Outpatient Medications  Medication Sig Dispense Refill  . amphetamine-dextroamphetamine (ADDERALL) 20 MG tablet TAKE 1 TABLET BY MOUTH IN THE MORNING AND 1 EVERYDAY AT NOON AND 1 TABLET AT 4PM  0  . buPROPion (WELLBUTRIN XL) 150 MG 24 hr tablet TAKE 1 TABLET BY MOUTH ONCE A DAY FOR 7 DAYS THEN 2 TABLETS IN EVERDAY FOR 7 DAYS THEN 3 TABLETS BY MOUTH EVERYDAY THEREAFTER  12  . cephALEXin (KEFLEX) 500 MG capsule Take 1 capsule (500 mg total) by mouth 3 (three) times daily. 30 capsule 0  . clobetasol (TEMOVATE) 0.05 % external solution APPLY SOLUTION TOPICALLY TO AFFECTED AREA UP TO TWICE DAILY AS NEEDED (NOT TO FACE  GROIN AND AXILLA)  3  . escitalopram (LEXAPRO) 20 MG tablet Take 20 mg by mouth every morning.  12  . fluticasone (CUTIVATE) 0.05 % cream fluticasone propionate 0.05 % topical cream    . fluticasone (FLONASE) 50 MCG/ACT nasal spray Use 2 spray(s) in each nostril once daily 16 g 11  . gabapentin (NEURONTIN) 100 MG capsule Take 1 capsule (100 mg total) by mouth at bedtime. 90 capsule 3  . hydrOXYzine (ATARAX/VISTARIL) 10 MG tablet Take 1 tablet (10 mg total) by mouth 3 (three) times daily as needed for anxiety. 90 tablet 0  . ibuprofen (ADVIL) 800 MG tablet Take 1 tablet by mouth twice daily 180 tablet 0  . levonorgestrel (MIRENA) 20 MCG/24HR IUD 1 each by Intrauterine route once.    Marland Kitchen levothyroxine (SYNTHROID) 75 MCG tablet Take 1 tablet (75 mcg total) by mouth daily before breakfast. 30 tablet 3  . meloxicam (MOBIC) 15 MG tablet Take 1 tablet (15 mg total) by mouth daily. 30 tablet 0  . naproxen (NAPROSYN) 500 MG tablet Take 1 tablet (500 mg total) by mouth 2 (two) times daily as needed. 20 tablet 0  . ondansetron (ZOFRAN) 4 MG tablet Take 1 tablet (4 mg total) by mouth every 8 (eight) hours as needed. 20 tablet 0  . Salicylic Acid 3 % SHAM Use daily as directed for dry scalp 236 mL 1  . selenium sulfide (SELSUN) 2.5 % shampoo USE  LOTION EXTERNALLY TO AFFECTED AREA ONCE DAILY AS NEEDED FOR  IRRITATION 120 mL 0  . tranexamic acid (LYSTEDA) 650 MG TABS tablet Take 650 mg by mouth  daily.  0  . venlafaxine XR (EFFEXOR-XR) 150 MG 24 hr capsule Take 1 capsule (150 mg total) by mouth daily with breakfast. 90 capsule 3  . Vitamin D, Ergocalciferol, (DRISDOL) 1.25 MG (50000 UT) CAPS capsule Take 1 capsule (50,000 Units total) by mouth every 7 (seven) days. 12 capsule 0   No current facility-administered medications for this visit.    Allergies: Patient has no known allergies.  Past Medical History:  Diagnosis Date  . Anxiety   . Arthritis   . Carpal tunnel syndrome   . Depression   . Hammer toe  04/2013   bilateral 4th and 5th   . Ingrown left big toenail 04/2013  . Mental disorder   . Neuromuscular disorder ([place])   . Post term pregnancy over 40 weeks 09/21/2015  . SVD (spontaneous vaginal delivery) 09/22/2015  . UTI (lower urinary tract infection)     Past Surgical History:  Procedure Laterality Date  . BREAST SURGERY    . CORRECTION HAMMER TOE Bilateral   . CRYOABLATION  2004  . CYSTOSCOPY WITH HYDRODISTENSION AND BIOPSY  07/28/2004   with urethral dilatation  . DILATION AND CURETTAGE OF UTERUS    . DILATION AND EVACUATION  12/21/2011   Procedure: DILATATION AND EVACUATION;  Surgeon: Terrance Mass, MD;  Location: [facility];  Service: Gynecology;  Laterality: N/A;  first trimester   . HAMMER TOE SURGERY Bilateral 04/20/2013   Procedure: BILATERAL HAMMER TOE REPAIR AND EXCISION NAIL PERMANENT LEFT FIRST;  Surgeon: Harriet Masson, DPM;  Location: [facility];  Service: Podiatry;  Laterality: Bilateral;  Hammer toe repair fourth and fifth toes bilateral feet with screw placement fourth toes and partial nail excision left great toe  . HYSTEROSALPINGOGRAM    . TOE SURGERY     ingrown toenail  . WISDOM TOOTH EXTRACTION      Family History  Problem Relation Age of Onset  . Cancer Father        lung cancer  . Birth defects Paternal Grandfather   . Seizures Mother   . Arthritis Mother   . Thyroid disease Maternal Grandmother   . Heart disease Paternal Grandmother        2 CABG    Social History   Tobacco Use  . Smoking status: Never Smoker  . Smokeless tobacco: Never Used  Substance Use Topics  . Alcohol use: No    Comment: occasionally    Subjective:  Increased sensation of choking with eating in the past few months; feels like food is getting stuck; does have some increased abdominal pain recently but no nausea/ vomiting or changes in bowel movements; no blood in stool/ no coffee grounds emesis; no prior history of GERD; Has lost almost  40 pounds in the past year- planned weight loss/ had "tummy tuck" out of the [place] done last February;   Objective:  Vitals:   03/02/19 1506  BP: 112/76  Weight: 150 lb (68 kg)  Height: 5\' 1"  (1.549 m)    General: Well developed, well nourished, in no acute distress  Skin : Warm and dry.  Head: Normocephalic and atraumatic  Lungs: Respirations unlabored; clear to auscultation bilaterally without wheeze, rales, rhonchi  CVS exam: normal rate and regular rhythm.  Abdomen: Soft; nontender; nondistended; normoactive bowel sounds; no masses or hepatosplenomegaly  Neurologic: Alert and oriented; speech intact; face symmetrical; moves all extremities well; CNII-XII intact without focal deficit  Assessment:  1. Dysphagia, unspecified type   2. Gastroesophageal reflux  disease, unspecified whether esophagitis present     Plan:  ? Need for dilatation; trial of Protonix 40 mg daily; refer to GI to discuss further treatment;   This visit occurred during the SARS-CoV-2 public health emergency.  Safety protocols were in place, including screening questions prior to the visit, additional usage of staff PPE, and extensive cleaning of exam room while observing appropriate contact time as indicated for disinfecting solutions.     No follow-ups on file.  Orders Placed This Encounter  Procedures  . Ambulatory referral to Gastroenterology    Referral Priority:   Routine    Referral Type:   Consultation    Referral Reason:   Specialty Services Required    Number of Visits Requested:   1    Requested Prescriptions    No prescriptions requested or ordered in this encounter

## 2019-03-05 ENCOUNTER — Other Ambulatory Visit: Payer: Self-pay | Admitting: Family Medicine

## 2019-03-19 ENCOUNTER — Telehealth: Payer: Self-pay | Admitting: *Deleted

## 2019-03-19 NOTE — Telephone Encounter (Signed)
-----   Message from Garrel Ridgel, [place] sent at 03/17/2019  4:55 PM EDT ----- Negative for fungus and doesn't need to see me for it.

## 2019-03-19 NOTE — Telephone Encounter (Signed)
I informed pt of Dr. Hyatt's review of results and orders. 

## 2019-03-20 ENCOUNTER — Other Ambulatory Visit: Payer: Self-pay | Admitting: Family Medicine

## 2019-03-20 MED ORDER — NAPROXEN 500 MG PO TABS: 500.0000 mg | ORAL_TABLET | Freq: Two times a day (BID) | ORAL | 0 refills | Status: DC | PRN

## 2019-03-27 ENCOUNTER — Ambulatory Visit (INDEPENDENT_AMBULATORY_CARE_PROVIDER_SITE_OTHER): Payer: Medicare Other | Admitting: Gastroenterology

## 2019-03-27 ENCOUNTER — Encounter: Payer: Self-pay | Admitting: Gastroenterology

## 2019-03-27 VITALS — BP 114/74 | HR 88 | Temp 98.3°F | Ht 61.0 in | Wt 151.1 lb

## 2019-03-27 DIAGNOSIS — Z01818 Encounter for other preprocedural examination: Secondary | ICD-10-CM | POA: Diagnosis not present

## 2019-03-27 DIAGNOSIS — R1310 Dysphagia, unspecified: Secondary | ICD-10-CM | POA: Diagnosis not present

## 2019-03-27 DIAGNOSIS — K219 Gastro-esophageal reflux disease without esophagitis: Secondary | ICD-10-CM

## 2019-03-27 MED ORDER — LANSOPRAZOLE 30 MG PO TBDD: 30.0000 mg | DELAYED_RELEASE_TABLET | Freq: Two times a day (BID) | ORAL | 3 refills | Status: DC

## 2019-03-27 NOTE — Patient Instructions (Signed)
If you are age 44 or older, your body mass index should be between 23-30. Your Body mass index is 28.55 kg/m. If this is out of the aforementioned range listed, please consider follow up with your Primary Care Provider.  If you are age 15 or younger, your body mass index should be between 19-25. Your Body mass index is 28.55 kg/m. If this is out of the aformentioned range listed, please consider follow up with your Primary Care Provider.   You have been scheduled for an endoscopy. Please follow written instructions given to you at your visit today. If you use inhalers (even only as needed), please bring them with you on the day of your procedure. Your physician has requested that you go to www.startemmi.com and enter the access code given to you at your visit today. This web site gives a general overview about your procedure. However, you should still follow specific instructions given to you by our office regarding your preparation for the procedure.  We have sent the following medications to your pharmacy for you to pick up at your convenience: Prevacid  Soft diet until EGD procedure.  Thank you for choosing me and [facility].   Harl Bowie, MD

## 2019-03-27 NOTE — Progress Notes (Signed)
Melissa [name]    DH:[id number]    08/21/1975  Primary Care Physician:Murray, Marvis Repress, FNP  Referring Physician: Marrian Salvage, [address],  [place] [address]   Chief complaint:  Dysphagia  HPI: 44 year old female here for new patient visit complaints of dysphagia  Has history of chronic GERD symptoms, but she has been having worsening symptoms in the past few months and she is choking almost daily with difficulty swallowing progressively worse and last 1 to 2 months. She has dysphagia to mostly solids, cannot eat meat, bread, vegetables without choking and she has to regurgitate it back almost 3-4 times a week.  Never had a food impaction has been always able to get it down with sips of water or regurgitate it back.  Her mother had esophageal stricture with similar symptoms and had EGD with dilation many years ago. No family history of GI malignancy  She stopped taking Protonix as it was difficult to swallow it and she also felt it was causing night sweats.  She is having to cut a lot of her pills because she cannot swallow them.  Denies any abdominal pain, loss of appetite, weight loss, change in bowel habits, melena or blood per rectum.   Outpatient Encounter Medications as of 03/27/2019  Medication Sig  . amphetamine-dextroamphetamine (ADDERALL) 20 MG tablet TAKE 1 TABLET BY MOUTH IN THE MORNING AND 1 EVERYDAY AT NOON AND 1 TABLET AT 4PM  . clobetasol (TEMOVATE) 0.05 % external solution APPLY SOLUTION TOPICALLY TO AFFECTED AREA UP TO TWICE DAILY AS NEEDED (NOT TO FACE GROIN AND AXILLA)  . escitalopram (LEXAPRO) 20 MG tablet Take 20 mg by mouth every morning.  . fluticasone (FLONASE) 50 MCG/ACT nasal spray Use 2 spray(s) in each nostril once daily  . gabapentin (NEURONTIN) 100 MG capsule Take 1 capsule (100 mg total) by mouth at bedtime.  . hydrOXYzine (ATARAX/VISTARIL) 10 MG tablet TAKE 1 TABLET BY MOUTH THREE TIMES DAILY AS NEEDED  FOR ANXIETY  . ibuprofen (ADVIL) 800 MG tablet Take 1 tablet by mouth twice daily (Patient taking differently: as needed. )  . levonorgestrel (MIRENA) 20 MCG/24HR IUD 1 each by Intrauterine route once.  Marland Kitchen levothyroxine (SYNTHROID) 75 MCG tablet Take 1 tablet (75 mcg total) by mouth daily before breakfast.  . meloxicam (MOBIC) 15 MG tablet Take 1 tablet (15 mg total) by mouth daily.  . Salicylic Acid 3 % SHAM Use daily as directed for dry scalp  . selenium sulfide (SELSUN) 2.5 % shampoo USE  LOTION EXTERNALLY TO AFFECTED AREA ONCE DAILY AS NEEDED FOR  IRRITATION  . tranexamic acid (LYSTEDA) 650 MG TABS tablet Take 650 mg by mouth daily.  Marland Kitchen venlafaxine XR (EFFEXOR-XR) 150 MG 24 hr capsule Take 1 capsule (150 mg total) by mouth daily with breakfast.  . Vitamin D, Ergocalciferol, (DRISDOL) 1.25 MG (50000 UT) CAPS capsule Take 1 capsule (50,000 Units total) by mouth every 7 (seven) days.  . [DISCONTINUED] buPROPion (WELLBUTRIN XL) 150 MG 24 hr tablet TAKE 1 TABLET BY MOUTH ONCE A DAY FOR 7 DAYS THEN 2 TABLETS IN EVERDAY FOR 7 DAYS THEN 3 TABLETS BY MOUTH EVERYDAY THEREAFTER  . [DISCONTINUED] fluticasone (CUTIVATE) 0.05 % cream fluticasone propionate 0.05 % topical cream  . [DISCONTINUED] naproxen (NAPROSYN) 500 MG tablet Take 1 tablet (500 mg total) by mouth 2 (two) times daily as needed.  . [DISCONTINUED] ondansetron (ZOFRAN) 4 MG tablet Take 1 tablet (4 mg  total) by mouth every 8 (eight) hours as needed.  . [DISCONTINUED] pantoprazole (PROTONIX) 40 MG tablet Take 1 tablet (40 mg total) by mouth daily.   No facility-administered encounter medications on file as of 03/27/2019.    Allergies as of 03/27/2019  . (No Known Allergies)    Past Medical History:  Diagnosis Date  . Anxiety   . Arthritis   . Carpal tunnel syndrome   . Depression   . Hammer toe 04/2013   bilateral 4th and 5th   . Ingrown left big toenail 04/2013  . Mental disorder   . Neuromuscular disorder ([place])   . Post term  pregnancy over 40 weeks 09/21/2015  . SVD (spontaneous vaginal delivery) 09/22/2015  . UTI (lower urinary tract infection)     Past Surgical History:  Procedure Laterality Date  . BREAST LIFT Bilateral   . CORRECTION HAMMER TOE Bilateral   . CRYOABLATION  2004  . CYSTOSCOPY WITH HYDRODISTENSION AND BIOPSY  07/28/2004   with urethral dilatation  . DILATION AND CURETTAGE OF UTERUS    . DILATION AND EVACUATION  12/21/2011   Procedure: DILATATION AND EVACUATION;  Surgeon: Terrance Mass, MD;  Location: [facility];  Service: Gynecology;  Laterality: N/A;  first trimester   . HAMMER TOE SURGERY Bilateral 04/20/2013   Procedure: BILATERAL HAMMER TOE REPAIR AND EXCISION NAIL PERMANENT LEFT FIRST;  Surgeon: Harriet Masson, DPM;  Location: [facility];  Service: Podiatry;  Laterality: Bilateral;  Hammer toe repair fourth and fifth toes bilateral feet with screw placement fourth toes and partial nail excision left great toe  . HYSTEROSALPINGOGRAM    . TOE SURGERY     ingrown toenail  . WISDOM TOOTH EXTRACTION      Family History  Problem Relation Age of Onset  . Lung cancer Father   . Lung cancer Paternal Grandfather   . Seizures Mother   . Arthritis Mother   . Thyroid disease Maternal Grandmother   . Heart disease Paternal Grandmother        2 CABG    Social History   Socioeconomic History  . Marital status: Single    Spouse name: Not on file  . Number of children: 1  . Years of education: Not on file  . Highest education level: Not on file  Occupational History  . Occupation: Homemaker  Tobacco Use  . Smoking status: Never Smoker  . Smokeless tobacco: Never Used  Substance and Sexual Activity  . Alcohol use: Yes    Comment: occasionally  . Drug use: No  . Sexual activity: Yes    Birth control/protection: I.U.D.  Other Topics Concern  . Not on file  Social History Narrative  . Not on file   Social Determinants of Health   Financial  Resource Strain:   . Difficulty of Paying Living Expenses:   Food Insecurity:   . Worried About Charity fundraiser in the Last Year:   . Arboriculturist in the Last Year:   Transportation Needs:   . Film/video editor (Medical):   Marland Kitchen Lack of Transportation (Non-Medical):   Physical Activity:   . Days of Exercise per Week:   . Minutes of Exercise per Session:   Stress:   . Feeling of Stress :   Social Connections:   . Frequency of Communication with Friends and Family:   . Frequency of Social Gatherings with Friends and Family:   . Attends Religious Services:   . Active  Member of Clubs or Organizations:   . Attends Archivist Meetings:   Marland Kitchen Marital Status:   Intimate Partner Violence:   . Fear of Current or Ex-Partner:   . Emotionally Abused:   Marland Kitchen Physically Abused:   . Sexually Abused:       Review of systems: All other review of systems negative except as mentioned in the HPI.   Physical Exam: Vitals:   03/27/19 1100  BP: 114/74  Pulse: 88  Temp: 98.3 F (36.8 C)   Body mass index is 28.55 kg/m. Gen:      No acute distress Neuro: alert and oriented x 3 Psych: normal mood and affect  Data Reviewed:  Reviewed labs, radiology imaging, old records and pertinent past GI work up   Assessment and Plan/Recommendations:  44 year old female with history of chronic GERD here for evaluation of solid dysphagia  Differential includes peptic stricture, eosinophilic esophagitis, large hiatal hernia, esophageal dysmotility and less likely esophageal cancer or neoplastic lesion We will proceed with EGD with esophageal biopsies and dilation if needed Advised patient to stay on soft diet until EGD to avoid food impaction  GERD: Unable to swallow pills Will send prescription for lansoprazole Solutab 30 mg twice daily, take it 30 minutes before breakfast and dinner Antireflux measures  The risks and benefits as well as alternatives of endoscopic procedure(s)  have been discussed and reviewed. All questions answered. The patient agrees to proceed.    The patient was provided an opportunity to ask questions and all were answered. The patient agreed with the plan and demonstrated an understanding of the instructions.  Damaris Hippo , MD    CC: Marrian Salvage,*

## 2019-03-31 ENCOUNTER — Telehealth: Payer: Self-pay

## 2019-03-31 NOTE — Telephone Encounter (Signed)
I have started a prior authorization for patients lansoprazole solutabs for her chronic GERD K21.9. This was done thru [facility]. Will await the outcome.

## 2019-04-02 NOTE — Telephone Encounter (Signed)
The patients Lansoprazole has been approved thru 01/01/2020 according to Covermymeds. Marana informed.

## 2019-04-06 ENCOUNTER — Ambulatory Visit (INDEPENDENT_AMBULATORY_CARE_PROVIDER_SITE_OTHER): Payer: Medicare Other | Admitting: Family Medicine

## 2019-04-06 ENCOUNTER — Encounter: Payer: Self-pay | Admitting: Family Medicine

## 2019-04-06 DIAGNOSIS — M2550 Pain in unspecified joint: Secondary | ICD-10-CM

## 2019-04-06 MED ORDER — VITAMIN D (ERGOCALCIFEROL) 1.25 MG (50000 UNIT) PO CAPS: 50000.0000 [IU] | ORAL_CAPSULE | ORAL | 0 refills | Status: DC

## 2019-04-06 MED ORDER — CELECOXIB 200 MG PO CAPS: ORAL_CAPSULE | ORAL | 2 refills | Status: DC

## 2019-04-06 NOTE — Progress Notes (Signed)
[place] Phone: [phone] Subjective:    I'm seeing this patient by the request  of:  Marrian Salvage, [place]  Virtual Visit via Video Note  I connected with Melissa [name] on 04/06/19 at 10:45 AM EDT by a video enabled telemedicine application and verified that I am speaking with the correct person using two identifiers.  Location: Patient: Patient is in home setting alone Provider: in office    I discussed the limitations of evaluation and management by telemedicine and the availability of in person appointments. The patient expressed understanding and agreed to proceed.   CC: Polyarthralgia  [id number]  Melissa [name] is a 44 y.o. female coming in with complaint of patient is a 44 year old female who complains of some polyarthralgia.  Last visit patient was unable to do significant amount and felt like she was worsening.  Patient is on Effexor, Synthroid 75 mcg, laboratory work-up continue to show low vitamin D and is on once weekly prescription.  Past medical history significant for bipolar and depression.  Patient states continues to have pain at this moment.  Patient states that it is severe overall.  Patient states that laboratory work-up did not show anything.  We did show some very mild elevation of sedimentation rate but improved from previous exam.  Patient had her Synthroid decreased again.  Been taking meloxicam regularly.       Past Medical History:  Diagnosis Date  . Anxiety   . Arthritis   . Carpal tunnel syndrome   . Depression   . GERD (gastroesophageal reflux disease)   . Hammer toe 04/2013   bilateral 4th and 5th   . Ingrown left big toenail 04/2013  . Mental disorder   . Neuromuscular disorder ([place])   . Post term pregnancy over 40 weeks 09/21/2015  . SVD (spontaneous vaginal delivery) 09/22/2015  . UTI (lower urinary tract infection)    Past Surgical History:    Procedure Laterality Date  . BREAST LIFT Bilateral   . CORRECTION HAMMER TOE Bilateral   . CRYOABLATION  2004  . CYSTOSCOPY WITH HYDRODISTENSION AND BIOPSY  07/28/2004   with urethral dilatation  . DILATION AND CURETTAGE OF UTERUS    . DILATION AND EVACUATION  12/21/2011   Procedure: DILATATION AND EVACUATION;  Surgeon: Terrance Mass, MD;  Location: [facility];  Service: Gynecology;  Laterality: N/A;  first trimester   . HAMMER TOE SURGERY Bilateral 04/20/2013   Procedure: BILATERAL HAMMER TOE REPAIR AND EXCISION NAIL PERMANENT LEFT FIRST;  Surgeon: Harriet Masson, DPM;  Location: [place];  Service: Podiatry;  Laterality: Bilateral;  Hammer toe repair fourth and fifth toes bilateral feet with screw placement fourth toes and partial nail excision left great toe  . HYSTEROSALPINGOGRAM    . TOE SURGERY     ingrown toenail  . WISDOM TOOTH EXTRACTION     Social History   Socioeconomic History  . Marital status: Single    Spouse name: Not on file  . Number of children: 1  . Years of education: Not on file  . Highest education level: Not on file  Occupational History  . Occupation: Homemaker  Tobacco Use  . Smoking status: Never Smoker  . Smokeless tobacco: Never Used  Substance and Sexual Activity  . Alcohol use: Yes    Comment: occasionally  . Drug use: No  . Sexual activity: Yes    Birth control/protection: I.U.D.  Other Topics Concern  . Not on file  Social History Narrative  . Not on file   Social Determinants of Health   Financial Resource Strain:   . Difficulty of Paying Living Expenses:   Food Insecurity:   . Worried About Charity fundraiser in the Last Year:   . Arboriculturist in the Last Year:   Transportation Needs:   . Film/video editor (Medical):   Marland Kitchen Lack of Transportation (Non-Medical):   Physical Activity:   . Days of Exercise per Week:   . Minutes of Exercise per Session:   Stress:   . Feeling of Stress :    Social Connections:   . Frequency of Communication with Friends and Family:   . Frequency of Social Gatherings with Friends and Family:   . Attends Religious Services:   . Active Member of Clubs or Organizations:   . Attends Archivist Meetings:   Marland Kitchen Marital Status:    No Known Allergies Family History  Problem Relation Age of Onset  . Lung cancer Father   . Lung cancer Paternal Grandfather   . Seizures Mother   . Arthritis Mother   . Thyroid disease Maternal Grandmother   . Heart disease Paternal Grandmother        2 CABG    Current Outpatient Medications (Endocrine & Metabolic):  .  levonorgestrel (MIRENA) 20 MCG/24HR IUD, 1 each by Intrauterine route once. Marland Kitchen  levothyroxine (SYNTHROID) 75 MCG tablet, Take 1 tablet (75 mcg total) by mouth daily before breakfast.   Current Outpatient Medications (Respiratory):  .  fluticasone (FLONASE) 50 MCG/ACT nasal spray, Use 2 spray(s) in each nostril once daily  Current Outpatient Medications (Analgesics):  .  celecoxib (CELEBREX) 200 MG capsule, One to 2 tablets by mouth daily as needed for pain. Marland Kitchen  ibuprofen (ADVIL) 800 MG tablet, Take 1 tablet by mouth twice daily (Patient taking differently: as needed. ) .  meloxicam (MOBIC) 15 MG tablet, Take 1 tablet (15 mg total) by mouth daily.  Current Outpatient Medications (Hematological):  .  tranexamic acid (LYSTEDA) 650 MG TABS tablet, Take 650 mg by mouth daily.  Current Outpatient Medications (Other):  .  amphetamine-dextroamphetamine (ADDERALL) 20 MG tablet, TAKE 1 TABLET BY MOUTH IN THE MORNING AND 1 EVERYDAY AT NOON AND 1 TABLET AT 4PM .  clobetasol (TEMOVATE) 0.05 % external solution, APPLY SOLUTION TOPICALLY TO AFFECTED AREA UP TO TWICE DAILY AS NEEDED (NOT TO FACE GROIN AND AXILLA) .  escitalopram (LEXAPRO) 20 MG tablet, Take 20 mg by mouth every morning. .  gabapentin (NEURONTIN) 100 MG capsule, Take 1 capsule (100 mg total) by mouth at bedtime. .  hydrOXYzine  (ATARAX/VISTARIL) 10 MG tablet, TAKE 1 TABLET BY MOUTH THREE TIMES DAILY AS NEEDED FOR ANXIETY .  lansoprazole (PREVACID SOLUTAB) 30 MG disintegrating tablet, Take 1 tablet (30 mg total) by mouth in the morning and at bedtime. .  Salicylic Acid 3 % SHAM, Use daily as directed for dry scalp .  selenium sulfide (SELSUN) 2.5 % shampoo, USE  LOTION EXTERNALLY TO AFFECTED AREA ONCE DAILY AS NEEDED FOR  IRRITATION .  venlafaxine XR (EFFEXOR-XR) 150 MG 24 hr capsule, Take 1 capsule (150 mg total) by mouth daily with breakfast. .  Vitamin D, Ergocalciferol, (DRISDOL) 1.25 MG (50000 UNIT) CAPS capsule, Take 1 capsule (50,000 Units total) by mouth every 7 (seven) days.   Reviewed prior external information including notes and imaging from  primary care provider As well as  notes that were available from care everywhere and other healthcare systems.  Past medical history, social, surgical and family history all reviewed in electronic medical record.  No pertanent information unless stated regarding to the chief complaint.   Review of Systems:  No , visual changes, nausea, vomiting, diarrhea, constipation, dizziness, abdominal pain, skin rash, fevers, chills, night sweats, weight loss, swollen lymph nodes, , chest pain, shortness of breath, mood changes. POSITIVE muscle aches, body aches, joint swelling, headaches  Objective    General: No apparent distress alert and oriented x3 mood and affect normal, dressed difficulty though with the virtual platform had to change most of it to audio    Impression and Recommendations:     This case required medical decision making of moderate complexity. The above documentation has been reviewed and is accurate and complete Lyndal Pulley, DO       Note: This dictation was prepared with Dragon dictation along with smaller phrase technology. Any transcriptional errors that result from this process are unintentional.

## 2019-04-06 NOTE — Assessment & Plan Note (Signed)
Continues to have polyarthralgia.  Seems to have maybe a mild seronegative arthritis.  Discussed with patient in great length about icing regimen, home exercise, which activities to do which wants to avoid.  Patient is to increase activity slowly over the course of the next several weeks again.  Discussed Celebrex for medication management and discontinue the meloxicam to see if this will be more beneficial.  Patient brought up auto immune medications that I do not want to do at the moment.  Patient is to increase activity slowly.  Follow-up again 4 to 8 weeks.  Total time reviewing patient's chart, including laboratory work-up with patient, refilling medications and discussing with her 40 minutes

## 2019-04-08 ENCOUNTER — Other Ambulatory Visit: Payer: Self-pay | Admitting: Gastroenterology

## 2019-04-08 ENCOUNTER — Ambulatory Visit (INDEPENDENT_AMBULATORY_CARE_PROVIDER_SITE_OTHER): Payer: Medicare Other

## 2019-04-08 ENCOUNTER — Encounter: Payer: Self-pay | Admitting: Gastroenterology

## 2019-04-08 DIAGNOSIS — Z1159 Encounter for screening for other viral diseases: Secondary | ICD-10-CM

## 2019-04-09 ENCOUNTER — Encounter: Payer: Medicare Other | Admitting: Podiatry

## 2019-04-09 LAB — SARS CORONAVIRUS 2 (TAT 6-24 HRS): SARS Coronavirus 2: NEGATIVE

## 2019-04-10 ENCOUNTER — Other Ambulatory Visit: Payer: Self-pay

## 2019-04-10 ENCOUNTER — Ambulatory Visit (AMBULATORY_SURGERY_CENTER): Payer: Medicare Other | Admitting: Gastroenterology

## 2019-04-10 ENCOUNTER — Encounter: Payer: Self-pay | Admitting: Gastroenterology

## 2019-04-10 VITALS — BP 96/52 | HR 48 | Temp 97.1°F | Resp 14 | Ht 61.0 in | Wt 151.0 lb

## 2019-04-10 DIAGNOSIS — K2950 Unspecified chronic gastritis without bleeding: Secondary | ICD-10-CM | POA: Diagnosis not present

## 2019-04-10 DIAGNOSIS — K259 Gastric ulcer, unspecified as acute or chronic, without hemorrhage or perforation: Secondary | ICD-10-CM | POA: Diagnosis not present

## 2019-04-10 DIAGNOSIS — R1310 Dysphagia, unspecified: Secondary | ICD-10-CM | POA: Diagnosis not present

## 2019-04-10 DIAGNOSIS — B9681 Helicobacter pylori [H. pylori] as the cause of diseases classified elsewhere: Secondary | ICD-10-CM | POA: Diagnosis not present

## 2019-04-10 MED ORDER — SODIUM CHLORIDE 0.9 % IV SOLN: 500.0000 mL | Freq: Once | INTRAVENOUS | Status: DC

## 2019-04-10 NOTE — Progress Notes (Signed)
Temp by JB  Vitals by CW 

## 2019-04-10 NOTE — Patient Instructions (Signed)
YOU HAD AN ENDOSCOPIC PROCEDURE TODAY AT THE [facility]:   Refer to the procedure report that was given to you for any specific questions about what was found during the examination.  If the procedure report does not answer your questions, please call your gastroenterologist to clarify.  If you requested that your care partner not be given the details of your procedure findings, then the procedure report has been included in a sealed envelope for you to review at your convenience later.  YOU SHOULD EXPECT: Some feelings of bloating in the abdomen. Passage of more gas than usual.  Walking can help get rid of the air that was put into your GI tract during the procedure and reduce the bloating. If you had a lower endoscopy (such as a colonoscopy or flexible sigmoidoscopy) you may notice spotting of blood in your stool or on the toilet paper. If you underwent a bowel prep for your procedure, you may not have a normal bowel movement for a few days.  Please Note:  You might notice some irritation and congestion in your nose or some drainage.  This is from the oxygen used during your procedure.  There is no need for concern and it should clear up in a day or so.  SYMPTOMS TO REPORT IMMEDIATELY:    Following upper endoscopy (EGD)  Vomiting of blood or coffee ground material  New chest pain or pain under the shoulder blades  Painful or persistently difficult swallowing  New shortness of breath  Fever of 100F or higher  Black, tarry-looking stools  For urgent or emergent issues, a gastroenterologist can be reached at any hour by calling [phone]. Do not use MyChart messaging for urgent concerns.    DIET:  We do recommend a small meal at first, but then you may proceed to your regular diet.  Drink plenty of fluids but you should avoid alcoholic beverages for 24 hours.  ACTIVITY:  You should plan to take it easy for the rest of today and you should NOT DRIVE or use heavy machinery  until tomorrow (because of the sedation medicines used during the test).    FOLLOW UP: Our staff will call the number listed on your records 48-72 hours following your procedure to check on you and address any questions or concerns that you may have regarding the information given to you following your procedure. If we do not reach you, we will leave a message.  We will attempt to reach you two times.  During this call, we will ask if you have developed any symptoms of COVID 19. If you develop any symptoms (ie: fever, flu-like symptoms, shortness of breath, cough etc.) before then, please call [phone].  If you test positive for Covid 19 in the 2 weeks post procedure, please call and report this information to us.    If any biopsies were taken you will be contacted by phone or by letter within the next 1-3 weeks.  Please call us at [phone] if you have not heard about the biopsies in 3 weeks.    SIGNATURES/CONFIDENTIALITY: You and/or your care partner have signed paperwork which will be entered into your electronic medical record.  These signatures attest to the fact that that the information above on your After Visit Summary has been reviewed and is understood.  Full responsibility of the confidentiality of this discharge information lies with you and/or your care-partner. 

## 2019-04-10 NOTE — Progress Notes (Signed)
Called to room to assist during endoscopic procedure.  Patient ID and intended procedure confirmed with present staff. Received instructions for my participation in the procedure from the performing physician.  

## 2019-04-10 NOTE — Op Note (Signed)
[place] Patient Name: Melissa [name] Procedure Date: 04/10/2019 9:54 AM MRN: [id number] Endoscopist: Mauri Pole , MD Age: 44 Referring MD:  Date of Birth: 1975-06-02 Gender: Female Account #: [id number] Procedure:                Upper GI endoscopy Indications:              Dysphagia, Epigastric abdominal pain Medicines:                Monitored Anesthesia Care Procedure:                Pre-Anesthesia Assessment:                           - Prior to the procedure, a History and Physical                            was performed, and patient medications and                            allergies were reviewed. The patient's tolerance of                            previous anesthesia was also reviewed. The risks                            and benefits of the procedure and the sedation                            options and risks were discussed with the patient.                            All questions were answered, and informed consent                            was obtained. Prior Anticoagulants: The patient has                            taken no previous anticoagulant or antiplatelet                            agents. ASA Grade Assessment: II - A patient with                            mild systemic disease. After reviewing the risks                            and benefits, the patient was deemed in                            satisfactory condition to undergo the procedure.                           After obtaining informed consent, the endoscope was  passed under direct vision. Throughout the                            procedure, the patient's blood pressure, pulse, and                            oxygen saturations were monitored continuously. The                            Endoscope was introduced through the mouth, and                            advanced to the second part of duodenum. The upper                            GI endoscopy  was accomplished without difficulty.                            The patient tolerated the procedure well. Scope In: Scope Out: Findings:                 The Z-line was regular and was found 35 cm from the                            incisors.                           No endoscopic abnormality was evident in the                            esophagus to explain the patient's complaint of                            dysphagia. It was decided, however, to proceed with                            dilation in the distal esophagus and at the                            gastroesophageal junction. A TTS dilator was passed                            through the scope. Dilation with a 16-17-18 mm                            balloon dilator was performed to 18 mm. The                            dilation site was examined following endoscope                            reinsertion and showed no change. Biopsies were  obtained from the proximal and distal esophagus                            with cold forceps for histology of suspected                            eosinophilic esophagitis.                           The gastroesophageal flap valve was visualized                            endoscopically and classified as Hill Grade II                            (fold present, opens with respiration).                           Few non-bleeding linear and superficial gastric                            ulcers with no stigmata of bleeding were found in                            the prepyloric region of the stomach. The largest                            lesion was 4 mm in largest dimension. Biopsies were                            taken with a cold forceps for Helicobacter pylori                            testing.                           The examined duodenum was normal. Complications:            No immediate complications. Estimated Blood Loss:     Estimated blood loss was  minimal. Impression:               - Z-line regular, 35 cm from the incisors.                           - No endoscopic esophageal abnormality to explain                            patient's dysphagia. Esophagus dilated. Dilated.                            Biopsied.                           - Gastroesophageal flap valve classified as Hill  Grade II (fold present, opens with respiration).                           - Non-bleeding gastric ulcers with no stigmata of                            bleeding. Biopsied.                           - Normal examined duodenum. Recommendation:           - Patient has a contact number available for                            emergencies. The signs and symptoms of potential                            delayed complications were discussed with the                            patient. Return to normal activities tomorrow.                            Written discharge instructions were provided to the                            patient.                           - Resume previous diet.                           - Continue present medications.                           - Continue Lansoprazole                           - No aspirin, ibuprofen, naproxen, or other                            non-steroidal anti-inflammatory drugs.                           - Await pathology results.                           - Return to GI office in 3 months for dysphagia                            management and follow up. Please call [phone]                            to schedule an appointment. Mauri Pole, MD 04/10/2019 10:18:13 AM This report has been signed electronically.

## 2019-04-10 NOTE — Progress Notes (Signed)
To PACU, VSS. Report to [email or web address] 

## 2019-04-14 ENCOUNTER — Telehealth: Payer: Self-pay

## 2019-04-14 NOTE — Telephone Encounter (Signed)
First post procedure follow up call, no answer 

## 2019-04-14 NOTE — Telephone Encounter (Signed)
  Follow up Call-  Call back number 04/10/2019  Post procedure Call Back phone  # [phone]  Permission to leave phone message Yes  Some recent data might be hidden     Patient questions:  Do you have a fever, pain , or abdominal swelling? No. Pain Score  0 *  Have you tolerated food without any problems? Yes.    Have you been able to return to your normal activities? Yes.    Do you have any questions about your discharge instructions: Diet   No. Medications  No. Follow up visit  No.  Do you have questions or concerns about your Care? No.  Actions: * If pain score is 4 or above: 1. No action needed, pain <4.Have you developed a fever since your procedure? no  2.   Have you had an respiratory symptoms (SOB or cough) since your procedure? no  3.   Have you tested positive for COVID 19 since your procedure no  4.   Have you had any family members/close contacts diagnosed with the COVID 19 since your procedure?  no   If yes to any of these questions please route to Joylene John, RN and Erenest Rasher, RN

## 2019-04-21 ENCOUNTER — Encounter: Payer: Self-pay | Admitting: Gastroenterology

## 2019-04-21 ENCOUNTER — Other Ambulatory Visit: Payer: Self-pay

## 2019-04-21 MED ORDER — METRONIDAZOLE 250 MG PO TABS: 250.0000 mg | ORAL_TABLET | Freq: Four times a day (QID) | ORAL | 0 refills | Status: AC

## 2019-04-21 MED ORDER — OMEPRAZOLE 40 MG PO CPDR: 40.0000 mg | DELAYED_RELEASE_CAPSULE | Freq: Two times a day (BID) | ORAL | 0 refills | Status: DC

## 2019-04-21 MED ORDER — BISMUTH SUBSALICYLATE 262 MG PO TABS: 2.0000 | ORAL_TABLET | Freq: Four times a day (QID) | ORAL | 0 refills | Status: AC

## 2019-04-21 MED ORDER — DOXYCYCLINE HYCLATE 100 MG PO CAPS: 100.0000 mg | ORAL_CAPSULE | Freq: Two times a day (BID) | ORAL | 0 refills | Status: AC

## 2019-04-23 ENCOUNTER — Encounter: Payer: Self-pay | Admitting: Plastic Surgery

## 2019-04-23 ENCOUNTER — Other Ambulatory Visit: Payer: Self-pay

## 2019-04-23 ENCOUNTER — Ambulatory Visit (INDEPENDENT_AMBULATORY_CARE_PROVIDER_SITE_OTHER): Payer: Medicare Other | Admitting: Plastic Surgery

## 2019-04-23 VITALS — BP 126/84 | HR 86 | Temp 98.6°F | Ht 61.0 in | Wt 149.2 lb

## 2019-04-23 DIAGNOSIS — N644 Mastodynia: Secondary | ICD-10-CM | POA: Diagnosis not present

## 2019-04-23 MED ORDER — LANSOPRAZOLE 30 MG PO TBDD: 30.0000 mg | DELAYED_RELEASE_TABLET | Freq: Two times a day (BID) | ORAL | 3 refills | Status: DC

## 2019-04-23 NOTE — Progress Notes (Signed)
Referring Provider Marrian Salvage, [place],  [place] [address]   CC:  Chief Complaint  Patient presents with  . Consult    breast issues from previous breast lift/reduction      Melissa [name] is an 44 y.o. female.  HPI: Patient is here to discuss a number of cosmetic concerns regarding previous aesthetic surgery.  About 2 years ago she had a bilateral mastopexy and abdominoplasty.  She is currently bothered by the appearance of her breasts in a number of regards.  She feels that her areola are too big bilaterally.  She is particularly bothered by the excess tissue laterally extending up towards the axilla.  She would also like for her breast to be higher but is not interested in them being bigger or and getting implants.  Regarding her abdomen she would like her anterior abdomen to be flatter and is bothered by some prominent dogears in the lateral aspect of the incision.  She says she is up-to-date on her mammograms.  In addition she complains of a vague left breast pain that she describes as burning and pulling.  This been present since her previous cosmetic surgery.  No Known Allergies  Outpatient Encounter Medications as of 04/23/2019  Medication Sig Note  . amphetamine-dextroamphetamine (ADDERALL) 20 MG tablet TAKE 1 TABLET BY MOUTH IN THE MORNING AND 1 EVERYDAY AT NOON AND 1 TABLET AT 4PM   . Bismuth Subsalicylate 99991111 MG TABS Take 2 tablets (524 mg total) by mouth in the morning, at noon, in the evening, and at bedtime for 10 days.   . celecoxib (CELEBREX) 200 MG capsule One to 2 tablets by mouth daily as needed for pain.   . clobetasol (TEMOVATE) 0.05 % external solution APPLY SOLUTION TOPICALLY TO AFFECTED AREA UP TO TWICE DAILY AS NEEDED (NOT TO FACE GROIN AND AXILLA)   . doxycycline (VIBRAMYCIN) 100 MG capsule Take 1 capsule (100 mg total) by mouth 2 (two) times daily for 10 days.   Marland Kitchen escitalopram (LEXAPRO) 20 MG tablet Take 20 mg by mouth every  morning.   . fluticasone (FLONASE) 50 MCG/ACT nasal spray Use 2 spray(s) in each nostril once daily   . gabapentin (NEURONTIN) 100 MG capsule Take 1 capsule (100 mg total) by mouth at bedtime.   . hydrOXYzine (ATARAX/VISTARIL) 10 MG tablet TAKE 1 TABLET BY MOUTH THREE TIMES DAILY AS NEEDED FOR ANXIETY   . ibuprofen (ADVIL) 800 MG tablet Take 1 tablet by mouth twice daily (Patient taking differently: as needed. )   . lansoprazole (PREVACID SOLUTAB) 30 MG disintegrating tablet Take 1 tablet (30 mg total) by mouth in the morning and at bedtime.   Marland Kitchen levonorgestrel (MIRENA) 20 MCG/24HR IUD 1 each by Intrauterine route once.   Marland Kitchen levothyroxine (SYNTHROID) 75 MCG tablet Take 1 tablet (75 mcg total) by mouth daily before breakfast.   . meloxicam (MOBIC) 15 MG tablet Take 1 tablet (15 mg total) by mouth daily.   . metroNIDAZOLE (FLAGYL) 250 MG tablet Take 1 tablet (250 mg total) by mouth 4 (four) times daily for 10 days.   . Salicylic Acid 3 % SHAM Use daily as directed for dry scalp   . selenium sulfide (SELSUN) 2.5 % shampoo USE  LOTION EXTERNALLY TO AFFECTED AREA ONCE DAILY AS NEEDED FOR  IRRITATION   . tranexamic acid (LYSTEDA) 650 MG TABS tablet Take 650 mg by mouth daily.   Marland Kitchen venlafaxine XR (EFFEXOR-XR) 150 MG 24 hr capsule Take 1  capsule (150 mg total) by mouth daily with breakfast.   . Vitamin D, Ergocalciferol, (DRISDOL) 1.25 MG (50000 UNIT) CAPS capsule Take 1 capsule (50,000 Units total) by mouth every 7 (seven) days.   . [DISCONTINUED] lansoprazole (PREVACID SOLUTAB) 30 MG disintegrating tablet Take 1 tablet (30 mg total) by mouth in the morning and at bedtime.   . [DISCONTINUED] omeprazole (PRILOSEC) 40 MG capsule Take 1 capsule (40 mg total) by mouth 2 (two) times daily before a meal. 04/23/2019: cost was high   No facility-administered encounter medications on file as of 04/23/2019.     Past Medical History:  Diagnosis Date  . Anxiety   . Arthritis   . Carpal tunnel syndrome   .  Depression   . GERD (gastroesophageal reflux disease)   . Hammer toe 04/2013   bilateral 4th and 5th   . Ingrown left big toenail 04/2013  . Mental disorder   . Neuromuscular disorder ([place])   . Post term pregnancy over 40 weeks 09/21/2015  . SVD (spontaneous vaginal delivery) 09/22/2015  . UTI (lower urinary tract infection)     Past Surgical History:  Procedure Laterality Date  . BREAST LIFT Bilateral   . CORRECTION HAMMER TOE Bilateral   . CRYOABLATION  2004  . CYSTOSCOPY WITH HYDRODISTENSION AND BIOPSY  07/28/2004   with urethral dilatation  . DILATION AND CURETTAGE OF UTERUS    . DILATION AND EVACUATION  12/21/2011   Procedure: DILATATION AND EVACUATION;  Surgeon: Terrance Mass, MD;  Location: [facility];  Service: Gynecology;  Laterality: N/A;  first trimester   . HAMMER TOE SURGERY Bilateral 04/20/2013   Procedure: BILATERAL HAMMER TOE REPAIR AND EXCISION NAIL PERMANENT LEFT FIRST;  Surgeon: Harriet Masson, DPM;  Location: [facility];  Service: Podiatry;  Laterality: Bilateral;  Hammer toe repair fourth and fifth toes bilateral feet with screw placement fourth toes and partial nail excision left great toe  . HYSTEROSALPINGOGRAM    . TOE SURGERY     ingrown toenail  . WISDOM TOOTH EXTRACTION      Family History  Problem Relation Age of Onset  . Lung cancer Father   . Lung cancer Paternal Grandfather   . Seizures Mother   . Arthritis Mother   . Thyroid disease Maternal Grandmother   . Heart disease Paternal Grandmother        2 CABG    Social History   Social History Narrative  . Not on file     Review of Systems General: Denies fevers, chills, weight loss CV: Denies chest pain, shortness of breath, palpitations  Physical Exam Vitals with BMI 04/23/2019 04/10/2019 04/10/2019  Height 5\' 1"  - -  Weight 149 lbs 3 oz - -  BMI 99991111 - -  Systolic [id number] 96 91  Diastolic 84 52 51  Pulse 86 48 54  Some encounter information is confidential  and restricted. Go to Review Flowsheets activity to see all data.    General:  No acute distress,  Alert and oriented, Non-Toxic, Normal speech and affect Breast: She has no nipple ptosis but some pseudoptosis.  She is fairly symmetric in terms of her size and shape.  Her areola are enlarged on both sides with some thickened scar on the left side superiorly.  She does have excess skin and fat in the lateral aspect extending out towards the axilla on both sides.  The scar is a periareolar scar that goes down to the inframammary crease and extends out  laterally for just a few centimeters.  I do not see any obvious masses or other scars.  I do not feel any areas of tenderness that correlate with her pain symptoms. Abdomen: She has a transverse lower scar consistent with a previous abdominoplasty.  She has some fullness in the infra and supraumbilical areas.  There is a very mild dogears laterally.  Assessment/Plan Patient presents with a number of aesthetic concerns.  I recommended revision mastopexy along with abdominal liposuction and excision of the dogears.  We discussed the risk that include bleeding, infection, demonstrating structures, need for additional procedures.  We discussed the potential for hip areolar complex vascular compromise.  I would need to use a permanent Gore-Tex suture around her areola to present prevent them from enlarging again.  Also explained that her mastopexy scar would have to extend out to her axilla.  The abdominal contour could be improved with liposuction as I do not think she has enough skin excess to ox excise anymore.  The dogear excision would be a subtle change.  I explained that I do not have a good explanation for her pain.  I am uncertain if revision surgery would fix it but I would be excising her previous scar and doing a revision mastopexy.  She is fully understanding of that fact.  Cindra Presume 04/23/2019, 4:05 PM

## 2019-04-29 ENCOUNTER — Telehealth: Payer: Self-pay | Admitting: Plastic Surgery

## 2019-04-29 NOTE — Telephone Encounter (Signed)
Called patient; left message for her to return my call at her convenience. Office staff is providing the cosmetic quote to patient, as the desired surgery is aesthetic in nature and does not meet insurance guidelines.

## 2019-05-08 ENCOUNTER — Other Ambulatory Visit: Payer: Self-pay | Admitting: Family Medicine

## 2019-05-16 ENCOUNTER — Other Ambulatory Visit: Payer: Self-pay | Admitting: Family Medicine

## 2019-05-18 ENCOUNTER — Ambulatory Visit (INDEPENDENT_AMBULATORY_CARE_PROVIDER_SITE_OTHER): Payer: Medicare Other | Admitting: Family Medicine

## 2019-05-18 ENCOUNTER — Encounter: Payer: Self-pay | Admitting: Family Medicine

## 2019-05-18 DIAGNOSIS — M5412 Radiculopathy, cervical region: Secondary | ICD-10-CM | POA: Diagnosis not present

## 2019-05-18 DIAGNOSIS — G5601 Carpal tunnel syndrome, right upper limb: Secondary | ICD-10-CM

## 2019-05-18 MED ORDER — VILAZODONE HCL 20 MG PO TABS: 1.0000 | ORAL_TABLET | Freq: Every day | ORAL | 1 refills | Status: DC

## 2019-05-18 NOTE — Progress Notes (Signed)
Virtual Visit via Video Note  I connected with Melissa [name] on 05/18/19 at 11:45 AM EDT by a video enabled telemedicine application and verified that I am speaking with the correct person using two identifiers.  Location: Patient: Patient is on her phone and is outside. Provider: In office setting   I discussed the limitations of evaluation and management by telemedicine and the availability of in person appointments. The patient expressed understanding and agreed to proceed.  History of Present Illness: Patient is a 44 year old female who has had chronic neck issues for no greater than 2 years.  MRI showed maybe some mild nerve impingement and patient did get response to 2 epidurals but both were short-lived less than a week.  Patient has been on different medications including Celebrex with minimal improvement at this time.  Effexor initially made improvement but no longer.    Observations/Objective: Alert and oriented x3, patient knows her history very well. Difficult with virtual platform  Assessment and Plan: 44 year old female with continued neck pain with radiation.  Patient 10/20/2017 did have a nerve conduction study showing carpal tunnel and we discussed potentially trying another injection of this.  Discussed home exercises, icing regimen, change medication of the Effexor to vilazodone hoping for improvement with the newer medication.  Discussed oral anti-inflammatories patient has already seen neurosurgery who did not feel she was a surgical candidate either.  Follow-up with me again in office in 4 weeks total time with patient as well as reviewing chart 40 minutes   Follow Up Instructions: 4 weeks in office for potential carpal tunnel injection to see if we can help with some of the arm pain    I discussed the assessment and treatment plan with the patient. The patient was provided an opportunity to ask questions and all were answered. The patient agreed with the plan and  demonstrated an understanding of the instructions.   The patient was advised to call back or seek an in-person evaluation if the symptoms worsen or if the condition fails to improve as anticipated.    Lyndal Pulley, DO

## 2019-06-05 LAB — LAB REPORT - SCANNED: A1c: 7

## 2019-06-10 ENCOUNTER — Other Ambulatory Visit: Payer: Self-pay

## 2019-06-10 ENCOUNTER — Ambulatory Visit (INDEPENDENT_AMBULATORY_CARE_PROVIDER_SITE_OTHER): Payer: Medicare Other | Admitting: Family

## 2019-06-10 ENCOUNTER — Encounter: Payer: Self-pay | Admitting: Family

## 2019-06-10 VITALS — BP 120/82 | HR 80 | Temp 98.5°F | Wt 151.4 lb

## 2019-06-10 DIAGNOSIS — H9193 Unspecified hearing loss, bilateral: Secondary | ICD-10-CM | POA: Diagnosis not present

## 2019-06-10 DIAGNOSIS — L299 Pruritus, unspecified: Secondary | ICD-10-CM

## 2019-06-10 MED ORDER — PREDNISONE 20 MG PO TABS: 40.0000 mg | ORAL_TABLET | Freq: Every day | ORAL | 0 refills | Status: DC

## 2019-06-10 MED ORDER — FAMOTIDINE 20 MG PO TABS: 20.0000 mg | ORAL_TABLET | Freq: Two times a day (BID) | ORAL | 0 refills | Status: DC

## 2019-06-10 MED ORDER — HYDROXYZINE HCL 10 MG PO TABS: 10.0000 mg | ORAL_TABLET | Freq: Three times a day (TID) | ORAL | 0 refills | Status: DC | PRN

## 2019-06-10 NOTE — Progress Notes (Signed)
Melissa [name] is a 44 y.o. female with the following history as recorded in EpicCare:  Patient Active Problem List   Diagnosis Date Noted  . Nonallopathic lesion of lumbosacral region 03/01/2019  . Nonallopathic lesion of cervical region 03/01/2019  . Nonallopathic lesion of sacral region 03/01/2019  . Nonallopathic lesion of thoracic region 03/01/2019  . Nonallopathic lesion of rib cage 63/28/2021  . Vitamin D deficiency 05/08/2018  . Neck pain 06/17/2017  . Right lateral epicondylitis 04/15/2017  . Piriformis syndrome, left 02/25/2017  . Vaginal discharge 09/11/2016  . Tendinitis of extensor tendon of right hand 06/11/2016  . Mixed stress and urge urinary incontinence 02/07/2016  . Carpal tunnel syndrome 01/03/2016  . Polyarthralgia 01/03/2016  . Galactocele associated with childbirth 11/07/2015  . Pregnancy with poor obstetric history 02/21/2015  . Multigravida of advanced maternal age 63/20/2017  . Radial tunnel syndrome 06/17/2014  . Cervical radiculopathy 06/10/2014  . Bilateral low back pain with right-sided sciatica 06/10/2014  . Bipolar depression ([place]) 05/17/2014  . Chronic night sweats 06/26/2013  . History of recurrent spontaneous abortion, not currently pregnant 01/26/2012  . Fluid retention 01/25/2012  . Obesity (BMI 30.0-34.9) 01/25/2012    Current Outpatient Medications  Medication Sig Dispense Refill  . amphetamine-dextroamphetamine (ADDERALL) 20 MG tablet TAKE 1 TABLET BY MOUTH IN THE MORNING AND 1 EVERYDAY AT NOON AND 1 TABLET AT 4PM  0  . celecoxib (CELEBREX) 200 MG capsule One to 2 tablets by mouth daily as needed for pain. 60 capsule 2  . clobetasol (TEMOVATE) 0.05 % external solution APPLY SOLUTION TOPICALLY TO AFFECTED AREA UP TO TWICE DAILY AS NEEDED (NOT TO FACE GROIN AND AXILLA)  3  . escitalopram (LEXAPRO) 20 MG tablet Take 20 mg by mouth every morning.  12  . fluticasone (FLONASE) 50 MCG/ACT nasal spray Use 2 spray(s) in each nostril once daily 16  g 11  . gabapentin (NEURONTIN) 100 MG capsule Take 1 capsule (100 mg total) by mouth at bedtime. 90 capsule 3  . hydrOXYzine (ATARAX/VISTARIL) 10 MG tablet Take 1 tablet (10 mg total) by mouth 3 (three) times daily as needed for itching. for anxiety 30 tablet 0  . ibuprofen (ADVIL) 800 MG tablet Take 1 tablet by mouth twice daily (Patient taking differently: as needed. ) 180 tablet 0  . lansoprazole (PREVACID SOLUTAB) 30 MG disintegrating tablet Take 1 tablet (30 mg total) by mouth in the morning and at bedtime. 60 tablet 3  . levonorgestrel (MIRENA) 20 MCG/24HR IUD 1 each by Intrauterine route once.    Marland Kitchen levothyroxine (SYNTHROID) 75 MCG tablet Take 1 tablet (75 mcg total) by mouth daily before breakfast. 30 tablet 3  . meloxicam (MOBIC) 15 MG tablet Take 1 tablet by mouth once daily 30 tablet 0  . naproxen (NAPROSYN) 500 MG tablet Take 1 tablet by mouth twice daily as needed 20 tablet 0  . Salicylic Acid 3 % SHAM Use daily as directed for dry scalp 236 mL 1  . selenium sulfide (SELSUN) 2.5 % shampoo USE  LOTION EXTERNALLY TO AFFECTED AREA ONCE DAILY AS NEEDED FOR  IRRITATION 120 mL 0  . tranexamic acid (LYSTEDA) 650 MG TABS tablet Take 650 mg by mouth daily.  0  . Vilazodone HCl 20 MG TABS Take 1 tablet (20 mg total) by mouth daily. 30 tablet 1  . Vitamin D, Ergocalciferol, (DRISDOL) 1.25 MG (50000 UNIT) CAPS capsule Take 1 capsule (50,000 Units total) by mouth every 7 (seven) days. 12 capsule 0  .  famotidine (PEPCID) 20 MG tablet Take 1 tablet (20 mg total) by mouth 2 (two) times daily. 20 tablet 0  . predniSONE (DELTASONE) 20 MG tablet Take 2 tablets (40 mg total) by mouth daily with breakfast. 10 tablet 0   No current facility-administered medications for this visit.    Allergies: Patient has no known allergies.  Past Medical History:  Diagnosis Date  . Anxiety   . Arthritis   . Carpal tunnel syndrome   . Depression   . GERD (gastroesophageal reflux disease)   . Hammer toe 04/2013    bilateral 4th and 5th   . Ingrown left big toenail 04/2013  . Mental disorder   . Neuromuscular disorder ([place])   . Post term pregnancy over 40 weeks 09/21/2015  . SVD (spontaneous vaginal delivery) 09/22/2015  . UTI (lower urinary tract infection)     Past Surgical History:  Procedure Laterality Date  . BREAST LIFT Bilateral   . CORRECTION HAMMER TOE Bilateral   . CRYOABLATION  2004  . CYSTOSCOPY WITH HYDRODISTENSION AND BIOPSY  07/28/2004   with urethral dilatation  . DILATION AND CURETTAGE OF UTERUS    . DILATION AND EVACUATION  12/21/2011   Procedure: DILATATION AND EVACUATION;  Surgeon: Terrance Mass, MD;  Location: [facility];  Service: Gynecology;  Laterality: N/A;  first trimester   . HAMMER TOE SURGERY Bilateral 04/20/2013   Procedure: BILATERAL HAMMER TOE REPAIR AND EXCISION NAIL PERMANENT LEFT FIRST;  Surgeon: Harriet Masson, DPM;  Location: [facility];  Service: Podiatry;  Laterality: Bilateral;  Hammer toe repair fourth and fifth toes bilateral feet with screw placement fourth toes and partial nail excision left great toe  . HYSTEROSALPINGOGRAM    . TOE SURGERY     ingrown toenail  . WISDOM TOOTH EXTRACTION      Family History  Problem Relation Age of Onset  . Lung cancer Father   . Lung cancer Paternal Grandfather   . Seizures Mother   . Arthritis Mother   . Thyroid disease Maternal Grandmother   . Heart disease Paternal Grandmother        2 CABG    Social History   Tobacco Use  . Smoking status: Never Smoker  . Smokeless tobacco: Never Used  Substance Use Topics  . Alcohol use: Yes    Comment: occasionally    Subjective:  Patient notes she has been "itching" all over for the past week; no rash noted; has taken one dose of Benadryl with some benefit but has not continued; denies any new soaps, foods, detergents or medications. Did use some sun screen while she was the beach last week and states that she had sun poisoning last  week;  Also requesting referral to ENT- "wants to get her ears washed out." Used ear wax removal kit yesterday with success.    Objective:  Vitals:   06/10/19 1201  BP: 120/82  Pulse: 80  Temp: 98.5 F (36.9 C)  TempSrc: Oral  SpO2: 98%  Weight: 151 lb 6.4 oz (68.7 kg)    General: Well developed, well nourished, in no acute distress  Skin : Warm and dry. No rash noted;  Head: Normocephalic and atraumatic  Eyes: Sclera and conjunctiva clear; pupils round and reactive to light; extraocular movements intact  Ears: External normal; canals clear; tympanic membranes normal  Lungs: Respirations unlabored;  Neurologic: Alert and oriented; speech intact; face symmetrical; moves all extremities well; CNII-XII intact without focal deficit   Assessment:  1. Itching   2. Decreased hearing of both ears     Plan:  1. Will treat with combination of prednisone, Atarax and Pepcid; if symptoms persist, will need to update labs; 2. Reassurance that ears are normal but patient wants to see ENT and requests referral to "have her ears washed out."  This visit occurred during the SARS-CoV-2 public health emergency.  Safety protocols were in place, including screening questions prior to the visit, additional usage of staff PPE, and extensive cleaning of exam room while observing appropriate contact time as indicated for disinfecting solutions.    No follow-ups on file.  Orders Placed This Encounter  Procedures  . Ambulatory referral to ENT    Referral Priority:   Routine    Referral Type:   Consultation    Referral Reason:   Specialty Services Required    Requested Specialty:   Otolaryngology    Number of Visits Requested:   1    Requested Prescriptions   Signed Prescriptions Disp Refills  . hydrOXYzine (ATARAX/VISTARIL) 10 MG tablet 30 tablet 0    Sig: Take 1 tablet (10 mg total) by mouth 3 (three) times daily as needed for itching. for anxiety  . famotidine (PEPCID) 20 MG tablet 20 tablet  0    Sig: Take 1 tablet (20 mg total) by mouth 2 (two) times daily.  . predniSONE (DELTASONE) 20 MG tablet 10 tablet 0    Sig: Take 2 tablets (40 mg total) by mouth daily with breakfast.

## 2019-06-15 ENCOUNTER — Ambulatory Visit: Payer: Self-pay

## 2019-06-15 ENCOUNTER — Other Ambulatory Visit: Payer: Self-pay

## 2019-06-15 ENCOUNTER — Encounter: Payer: Self-pay | Admitting: Family Medicine

## 2019-06-15 ENCOUNTER — Ambulatory Visit (INDEPENDENT_AMBULATORY_CARE_PROVIDER_SITE_OTHER): Payer: Medicare Other | Admitting: Family Medicine

## 2019-06-15 VITALS — BP 130/98 | HR 89 | Ht 61.0 in | Wt 151.0 lb

## 2019-06-15 DIAGNOSIS — G5601 Carpal tunnel syndrome, right upper limb: Secondary | ICD-10-CM

## 2019-06-15 DIAGNOSIS — M25531 Pain in right wrist: Secondary | ICD-10-CM

## 2019-06-15 NOTE — Patient Instructions (Addendum)
Injected both wrists  See me in 3 months

## 2019-06-15 NOTE — Progress Notes (Signed)
[facility] Phone: [phone] Subjective:   [name], am serving as a scribe for Dr. Hulan Saas. This visit occurred during the SARS-CoV-2 public health emergency.  Safety protocols were in place, including screening questions prior to the visit, additional usage of staff PPE, and extensive cleaning of exam room while observing appropriate contact time as indicated for disinfecting solutions.   I'm seeing this patient by the request  of:  Marrian Salvage, FNP  CC: Bilateral wrist pain  SAY:[id number]   05/18/2019 4 weeks in office for potential carpal tunnel injection to see if we can help with some of the arm pain   Update 06/15/2019 Melissa [name] is a 44 y.o. female coming in with complaint of bilateral wrist pain. Patient states she  She continues have numbness.  Pain on a daily basis.  The bracing does not seem to be helpful at the moment.     Past Medical History:  Diagnosis Date  . Anxiety   . Arthritis   . Carpal tunnel syndrome   . Depression   . GERD (gastroesophageal reflux disease)   . Hammer toe 04/2013   bilateral 4th and 5th   . Ingrown left big toenail 04/2013  . Mental disorder   . Neuromuscular disorder ([place])   . Post term pregnancy over 40 weeks 09/21/2015  . SVD (spontaneous vaginal delivery) 09/22/2015  . UTI (lower urinary tract infection)    Past Surgical History:  Procedure Laterality Date  . BREAST LIFT Bilateral   . CORRECTION HAMMER TOE Bilateral   . CRYOABLATION  2004  . CYSTOSCOPY WITH HYDRODISTENSION AND BIOPSY  07/28/2004   with urethral dilatation  . DILATION AND CURETTAGE OF UTERUS    . DILATION AND EVACUATION  12/21/2011   Procedure: DILATATION AND EVACUATION;  Surgeon: Terrance Mass, MD;  Location: [facility];  Service: Gynecology;  Laterality: N/A;  first trimester   . HAMMER TOE SURGERY Bilateral 04/20/2013   Procedure: BILATERAL  HAMMER TOE REPAIR AND EXCISION NAIL PERMANENT LEFT FIRST;  Surgeon: Harriet Masson, DPM;  Location: [facility];  Service: Podiatry;  Laterality: Bilateral;  Hammer toe repair fourth and fifth toes bilateral feet with screw placement fourth toes and partial nail excision left great toe  . HYSTEROSALPINGOGRAM    . TOE SURGERY     ingrown toenail  . WISDOM TOOTH EXTRACTION     Social History   Socioeconomic History  . Marital status: Single    Spouse name: Not on file  . Number of children: 1  . Years of education: Not on file  . Highest education level: Not on file  Occupational History  . Occupation: Homemaker  Tobacco Use  . Smoking status: Never Smoker  . Smokeless tobacco: Never Used  Vaping Use  . Vaping Use: Never used  Substance and Sexual Activity  . Alcohol use: Yes    Comment: occasionally  . Drug use: No  . Sexual activity: Yes    Birth control/protection: I.U.D.  Other Topics Concern  . Not on file  Social History Narrative  . Not on file   Social Determinants of Health   Financial Resource Strain:   . Difficulty of Paying Living Expenses:   Food Insecurity:   . Worried About Charity fundraiser in the Last Year:   . Arboriculturist in the Last Year:   Transportation Needs:   . Lack  of Transportation (Medical):   Marland Kitchen Lack of Transportation (Non-Medical):   Physical Activity:   . Days of Exercise per Week:   . Minutes of Exercise per Session:   Stress:   . Feeling of Stress :   Social Connections:   . Frequency of Communication with Friends and Family:   . Frequency of Social Gatherings with Friends and Family:   . Attends Religious Services:   . Active Member of Clubs or Organizations:   . Attends Archivist Meetings:   Marland Kitchen Marital Status:    No Known Allergies Family History  Problem Relation Age of Onset  . Lung cancer Father   . Lung cancer Paternal Grandfather   . Seizures Mother   . Arthritis Mother   . Thyroid disease  Maternal Grandmother   . Heart disease Paternal Grandmother        2 CABG    Current Outpatient Medications (Endocrine & Metabolic):  .  levonorgestrel (MIRENA) 20 MCG/24HR IUD, 1 each by Intrauterine route once. Marland Kitchen  levothyroxine (SYNTHROID) 75 MCG tablet, Take 1 tablet (75 mcg total) by mouth daily before breakfast. .  predniSONE (DELTASONE) 20 MG tablet, Take 2 tablets (40 mg total) by mouth daily with breakfast.   Current Outpatient Medications (Respiratory):  .  fluticasone (FLONASE) 50 MCG/ACT nasal spray, Use 2 spray(s) in each nostril once daily  Current Outpatient Medications (Analgesics):  .  celecoxib (CELEBREX) 200 MG capsule, One to 2 tablets by mouth daily as needed for pain. Marland Kitchen  ibuprofen (ADVIL) 800 MG tablet, Take 1 tablet by mouth twice daily (Patient taking differently: as needed. ) .  meloxicam (MOBIC) 15 MG tablet, Take 1 tablet by mouth once daily .  naproxen (NAPROSYN) 500 MG tablet, Take 1 tablet by mouth twice daily as needed  Current Outpatient Medications (Hematological):  .  tranexamic acid (LYSTEDA) 650 MG TABS tablet, Take 650 mg by mouth daily.  Current Outpatient Medications (Other):  .  amphetamine-dextroamphetamine (ADDERALL) 20 MG tablet, TAKE 1 TABLET BY MOUTH IN THE MORNING AND 1 EVERYDAY AT NOON AND 1 TABLET AT 4PM .  clobetasol (TEMOVATE) 0.05 % external solution, APPLY SOLUTION TOPICALLY TO AFFECTED AREA UP TO TWICE DAILY AS NEEDED (NOT TO FACE GROIN AND AXILLA) .  escitalopram (LEXAPRO) 20 MG tablet, Take 20 mg by mouth every morning. .  famotidine (PEPCID) 20 MG tablet, Take 1 tablet (20 mg total) by mouth 2 (two) times daily. Marland Kitchen  gabapentin (NEURONTIN) 100 MG capsule, Take 1 capsule (100 mg total) by mouth at bedtime. .  hydrOXYzine (ATARAX/VISTARIL) 10 MG tablet, Take 1 tablet (10 mg total) by mouth 3 (three) times daily as needed for itching. for anxiety .  lansoprazole (PREVACID SOLUTAB) 30 MG disintegrating tablet, Take 1 tablet (30 mg total)  by mouth in the morning and at bedtime. .  Salicylic Acid 3 % SHAM, Use daily as directed for dry scalp .  selenium sulfide (SELSUN) 2.5 % shampoo, USE  LOTION EXTERNALLY TO AFFECTED AREA ONCE DAILY AS NEEDED FOR  IRRITATION .  Vilazodone HCl 20 MG TABS, Take 1 tablet (20 mg total) by mouth daily. .  Vitamin D, Ergocalciferol, (DRISDOL) 1.25 MG (50000 UNIT) CAPS capsule, Take 1 capsule (50,000 Units total) by mouth every 7 (seven) days.   Reviewed prior external information including notes and imaging from  primary care provider As well as notes that were available from care everywhere and other healthcare systems.  Past medical history, social, surgical and family history  all reviewed in electronic medical record.  No pertanent information unless stated regarding to the chief complaint.   Review of Systems:  No headache, visual changes, nausea, vomiting, diarrhea, constipation, dizziness, abdominal pain, skin rash, fevers, chills, night sweats, weight loss, swollen lymph nodes, body aches, joint swelling, chest pain, shortness of breath, mood changes. POSITIVE muscle aches  Objective  Blood pressure (!) 130/98, pulse 89, height 5\' 1"  (1.549 m), weight 151 lb (68.5 kg), SpO2 98 %.   General: No apparent distress alert and oriented x3 mood and affect normal, dressed appropriately.  HEENT: Pupils equal, extraocular movements intact  Respiratory: Patient's speak in full sentences and does not appear short of breath  Cardiovascular: No lower extremity edema, non tender, no erythema  Neuro: Cranial nerves II through XII are intact, neurovascularly intact in all extremities with 2+ DTRs and 2+ pulses.  Gait normal with good balance and coordination.  MSK:   Bilateral wrist exam shows the patient does have a positive Tinel and positive Phalen's test bilaterally.  No wasting of the thenar eminence noted.  Procedure: Real-time Ultrasound Guided Injection of right carpal tunnel Device: GE Logiq Q7   Ultrasound guided injection is preferred based studies that show increased duration, increased effect, greater accuracy, decreased procedural pain, increased response rate with ultrasound guided versus blind injection.  Verbal informed consent obtained.  Time-out conducted.  Noted no overlying erythema, induration, or other signs of local infection.  Skin prepped in a sterile fashion.  Local anesthesia: Topical Ethyl chloride.  With sterile technique and under real time ultrasound guidance:  median nerve visualized.  23g 5/8 inch needle inserted distal to proximal approach into nerve sheath. Pictures taken nfor needle placement. Patient did have injection of 2 cc of 1% lidocaine, 1 cc of 0.5% Marcaine, and 1 cc of Kenalog 40 mg/dL. Completed without difficulty  Pain immediately resolved suggesting accurate placement of the medication.  Advised to call if fevers/chills, erythema, induration, drainage, or persistent bleeding.  Images permanently stored and available for review in the ultrasound unit.  Impression: Technically successful ultrasound guided injection.  Procedure: Real-time Ultrasound Guided Injection of left carpal tunnel Device: GE Logiq Q7 Ultrasound guided injection is preferred based studies that show increased duration, increased effect, greater accuracy, decreased procedural pain, increased response rate with ultrasound guided versus blind injection.  Verbal informed consent obtained.  Time-out conducted.  Noted no overlying erythema, induration, or other signs of local infection.  Skin prepped in a sterile fashion.  Local anesthesia: Topical Ethyl chloride.  With sterile technique and under real time ultrasound guidance:  median nerve visualized.  23g 5/8 inch needle inserted distal to proximal approach into nerve sheath. Pictures taken nfor needle placement. Patient did have injection of 2 cc of 0.5% Marcaine, and 1 cc of Kenalog 40 mg/dL. Completed without difficulty   Pain immediately resolved suggesting accurate placement of the medication.  Advised to call if fevers/chills, erythema, induration, drainage, or persistent bleeding.  Images permanently stored and available for review in the ultrasound unit.  Impression: Technically successful ultrasound guided injection.    Impression and Recommendations:     The above documentation has been reviewed and is accurate and complete Lyndal Pulley, DO       Note: This dictation was prepared with Dragon dictation along with smaller phrase technology. Any transcriptional errors that result from this process are unintentional.

## 2019-06-15 NOTE — Assessment & Plan Note (Signed)
Bilateral injections given today, tolerated the procedure well, discussed icing regimen and home exercises.  Patient has had EMGs done showing that this is mostly carpal tunnel and not as much of the cervical radiculopathy.  We can always repeat of the epidurals in the neck if necessary.  Discussed bracing at night.  Gabapentin.  Chronic problem with exacerbation.  Follow-up again in 3 months

## 2019-06-18 ENCOUNTER — Telehealth: Payer: Self-pay | Admitting: Podiatry

## 2019-06-18 ENCOUNTER — Other Ambulatory Visit: Payer: Self-pay

## 2019-06-18 ENCOUNTER — Ambulatory Visit (INDEPENDENT_AMBULATORY_CARE_PROVIDER_SITE_OTHER): Payer: Medicare Other | Admitting: Podiatry

## 2019-06-18 DIAGNOSIS — M205X1 Other deformities of toe(s) (acquired), right foot: Secondary | ICD-10-CM | POA: Diagnosis not present

## 2019-06-18 DIAGNOSIS — Z9889 Other specified postprocedural states: Secondary | ICD-10-CM

## 2019-06-18 DIAGNOSIS — T847XXD Infection and inflammatory reaction due to other internal orthopedic prosthetic devices, implants and grafts, subsequent encounter: Secondary | ICD-10-CM

## 2019-06-18 NOTE — Telephone Encounter (Signed)
Pt called and wanted to know if she could get a referral for a plastic surgeon for scar that is not healing. Please advise she would like a call back

## 2019-06-18 NOTE — Telephone Encounter (Signed)
Dr. Stephanie Coup

## 2019-06-19 NOTE — Telephone Encounter (Signed)
I got fax number to Dr. Eusebio Friendly office and faxed referral, [employer] and insurance.

## 2019-06-19 NOTE — Addendum Note (Signed)
Addended by: Harriett Sine D on: 06/19/2019 01:03 PM   Modules accepted: Orders

## 2019-06-19 NOTE — Telephone Encounter (Signed)
Left message informing pt of Dr. Stephenie Acres referral.

## 2019-06-19 NOTE — Telephone Encounter (Signed)
I spoke with [facility] - Dr. Lowell Bouton office and she states he is not taking scar revision pts, but Dr. Marla Roe [phone], does scar revision.

## 2019-06-20 NOTE — Progress Notes (Signed)
She presents today complaining of toes #2 and #3 he states that they are still painful since surgery.  She would like to have surgery again if necessary to help get the toes into better alignment and get rid of some of the thick scarring.  Objective: Vital signs are stable she is alert and oriented x3.  Pulses are palpable.  Toes #2 and 3 are longer than 4 and 5 and appear to be swollen and painful at the level of the DIPJ.  Radiographs taken demonstrate osteoarthritic changes at the DIPJ second and third.  Assessment: Pain in limb secondary to osteoarthritic changes of the DIPJ #2 #3 exquisitely tender on palpation  Plan: At this point we are going to go ahead and perform arthroplasties to the second third digits of the right foot at the level of the DIPJ.  We consented her today discussed etiology pathology conservative surgical therapy she understands that this may not result in a good outcome since these toes have been treated before she states that she understands and is amenable to it but they are painful and they irritate her shoe with shoe gear so she is willing to take the chance.  We did discuss the possible postop complications which may include but not limited to postop pain bleeding swelling infection recurrence need for further surgery overcorrection under correction loss of digit loss of limb loss of life.  Follow-up with her in the near future for surgical evaluation

## 2019-06-24 ENCOUNTER — Telehealth: Payer: Self-pay

## 2019-06-24 NOTE — Telephone Encounter (Signed)
DOS 07/10/2019  HAMMERTOE REPAIR 2,3 RT - 28285  [facility] MEDICARE EFFECTIVE DATE - 01/02/2019  PLAN DEDUCTIBLE - $0.00 OUT OF POCKET - $4500.00 F/$3692.23 REMAINING  CO-INSURANCE 0% / Day OUTPATIENT SURGERY 0% / [place] $325 / Day OUTPATIENT SURGERY $325 / [place]  Tracking #: [id number]  Message: [phone]: Procedures performed at an ambulatory surgery center do not require prior authorization.

## 2019-07-09 ENCOUNTER — Other Ambulatory Visit: Payer: Self-pay | Admitting: Podiatry

## 2019-07-09 ENCOUNTER — Telehealth: Payer: Self-pay | Admitting: Podiatry

## 2019-07-09 MED ORDER — OXYCODONE-ACETAMINOPHEN 10-325 MG PO TABS: 1.0000 | ORAL_TABLET | Freq: Three times a day (TID) | ORAL | 0 refills | Status: AC | PRN

## 2019-07-09 MED ORDER — CEPHALEXIN 500 MG PO CAPS: 500.0000 mg | ORAL_CAPSULE | Freq: Three times a day (TID) | ORAL | 0 refills | Status: DC

## 2019-07-09 MED ORDER — ONDANSETRON HCL 4 MG PO TABS: 4.0000 mg | ORAL_TABLET | Freq: Three times a day (TID) | ORAL | 0 refills | Status: DC | PRN

## 2019-07-09 NOTE — Telephone Encounter (Signed)
I spoke with [facility] assistant, and informed that pt had hammer toe surgery.

## 2019-07-09 NOTE — Telephone Encounter (Signed)
[place] and needs a diagnosis documented  to fill the pain  medication

## 2019-07-10 DIAGNOSIS — K219 Gastro-esophageal reflux disease without esophagitis: Secondary | ICD-10-CM | POA: Diagnosis not present

## 2019-07-10 DIAGNOSIS — M2041 Other hammer toe(s) (acquired), right foot: Secondary | ICD-10-CM | POA: Diagnosis not present

## 2019-07-13 ENCOUNTER — Telehealth: Payer: Self-pay | Admitting: Podiatry

## 2019-07-13 ENCOUNTER — Telehealth: Payer: Self-pay | Admitting: Sports Medicine

## 2019-07-13 NOTE — Telephone Encounter (Signed)
Patient called answering service reporting that on her surgical foot there is a itchy rash at the ankle.  Advised patient to apply cortisone cream and to closely monitor if area of rash worsens to call office or come in sooner to be rechecked.  I also advised patient if she is able to take Benadryl twice daily to see if this will assist with clearing up the rash.  Patient denies nausea vomiting fever chills shortness of breath or any other rashes outside of this area.

## 2019-07-13 NOTE — Telephone Encounter (Signed)
Pt called stated that she has a rash on surgical foot and it itches really bad and this is the 2nd time its happened and she wants to know why please assist

## 2019-07-15 NOTE — Telephone Encounter (Signed)
If it is only itching and there is no blistering then Benadryl would be her best option until we take the dressing off Thursday.  Otherwise she will need to come in for dressing change.

## 2019-07-16 ENCOUNTER — Ambulatory Visit (INDEPENDENT_AMBULATORY_CARE_PROVIDER_SITE_OTHER): Payer: Medicare Other

## 2019-07-16 ENCOUNTER — Encounter: Payer: Self-pay | Admitting: Podiatry

## 2019-07-16 ENCOUNTER — Ambulatory Visit (INDEPENDENT_AMBULATORY_CARE_PROVIDER_SITE_OTHER): Payer: Medicare Other | Admitting: Podiatry

## 2019-07-16 ENCOUNTER — Other Ambulatory Visit: Payer: Self-pay

## 2019-07-16 DIAGNOSIS — M2041 Other hammer toe(s) (acquired), right foot: Secondary | ICD-10-CM

## 2019-07-16 DIAGNOSIS — Z9889 Other specified postprocedural states: Secondary | ICD-10-CM

## 2019-07-16 NOTE — Progress Notes (Signed)
She presents today for her first postop visit date of surgery 07/10/2019 status post hammertoe repair #2 #3 of the right foot.  She states that they look much better and she is happy with the outcome thus far.  She states that she developed an itchy rash along the medial aspect of the right leg and it did not extend down to the foot. Objective: She presents today Darco shoe intact was removed dressed her dressing intact was removed demonstrates minimal edema no cellulitis drainage or odor sutures are intact margins appear to be coapting well.  She has a rash that is not elevated it appears to be a petechial type rash very possibly associated with venous insufficiency or venous dermatitis rather than any chemical.  Primarily because this is for her tourniquet was.  And has a very straight line of delineation for this rash.  Assessment: Well-healing surgical toes.  Plan: Redressed today dressed a compressive dressing I will follow-up with her in 10 to 11 days.  She will continue to keep these dry keep them elevated we will remove the sutures next visit

## 2019-07-17 ENCOUNTER — Encounter: Payer: Medicare Other | Admitting: Podiatry

## 2019-07-21 ENCOUNTER — Encounter: Payer: Medicare Other | Admitting: Podiatry

## 2019-07-23 ENCOUNTER — Ambulatory Visit: Payer: Medicare Other | Admitting: Podiatry

## 2019-07-23 ENCOUNTER — Encounter: Payer: Medicare Other | Admitting: Podiatry

## 2019-07-28 ENCOUNTER — Ambulatory Visit (INDEPENDENT_AMBULATORY_CARE_PROVIDER_SITE_OTHER): Payer: Medicare Other | Admitting: Podiatry

## 2019-07-28 ENCOUNTER — Other Ambulatory Visit: Payer: Self-pay

## 2019-07-28 ENCOUNTER — Encounter: Payer: Self-pay | Admitting: Podiatry

## 2019-07-28 DIAGNOSIS — Z9889 Other specified postprocedural states: Secondary | ICD-10-CM

## 2019-07-28 DIAGNOSIS — M2041 Other hammer toe(s) (acquired), right foot: Secondary | ICD-10-CM

## 2019-07-29 NOTE — Progress Notes (Signed)
She presents today for her second postop visit date of surgery 07/10/2019 status post hammertoe repair second and third toes of the right foot.  There is no erythema edema cellulitis drainage or odor sutures are intact margins are well coapted there is no signs of infection.  Assessment: Well-healing surgical toes.  Plan: Removed all of the sutures today placed her in little compression dressings provided her with a roll of Coban so that she can continue to dress them on a daily basis.  I would like to follow-up with her in about 2weeks will allow her to try to get back into tennis shoes as long it is nonpainful.  I will follow-up with her at that time

## 2019-08-03 ENCOUNTER — Telehealth: Payer: Self-pay | Admitting: Gastroenterology

## 2019-08-03 NOTE — Telephone Encounter (Signed)
She had positive H. pylori gastritis.  Esophageal biopsies negative for eosinophilic esophagitis.  S/p esophageal dilation 18 mm Please check if patient completed the treatment for H. pylori.  She did not do stool antigen test to confirm eradication.  Please bring her in sooner for follow-up visit if an appointment becomes available either with me or app.

## 2019-08-03 NOTE — Telephone Encounter (Signed)
Pt is requesting a call back from a nurse stating the colonoscopy procedure performed did not help her. Pt did not provider further details

## 2019-08-03 NOTE — Telephone Encounter (Signed)
Spoke with this extremely nice patient. She had an EGD with balloon dilation in April. She has continued with Lansoprazole dissolving tablets. She calls today because she has continued to choke. She avoids eating certain things that she knows are big offenders. Sometimes she chokes on liquids.  I have scheduled her for the next opening 10/05/19. (She also mentions big concerns with her medical bill)  Do you want her to see an APP sooner or stay with the appointment to see you?

## 2019-08-04 ENCOUNTER — Other Ambulatory Visit: Payer: Self-pay

## 2019-08-04 DIAGNOSIS — Z20822 Contact with and (suspected) exposure to covid-19: Secondary | ICD-10-CM | POA: Diagnosis not present

## 2019-08-04 DIAGNOSIS — K219 Gastro-esophageal reflux disease without esophagitis: Secondary | ICD-10-CM

## 2019-08-04 NOTE — Telephone Encounter (Signed)
Discussed with the patient. She agrees to the testing for eradication of the H Pylori infection.  Monitoring the schedule for a sooner follow up appointment.

## 2019-08-06 ENCOUNTER — Encounter: Payer: Medicare Other | Admitting: Podiatry

## 2019-08-13 ENCOUNTER — Encounter: Payer: Medicare Other | Admitting: Podiatry

## 2019-08-25 ENCOUNTER — Other Ambulatory Visit: Payer: Self-pay

## 2019-08-25 ENCOUNTER — Ambulatory Visit (INDEPENDENT_AMBULATORY_CARE_PROVIDER_SITE_OTHER): Payer: Medicare Other

## 2019-08-25 ENCOUNTER — Ambulatory Visit (INDEPENDENT_AMBULATORY_CARE_PROVIDER_SITE_OTHER): Payer: Medicare Other | Admitting: Podiatry

## 2019-08-25 ENCOUNTER — Encounter: Payer: Self-pay | Admitting: Podiatry

## 2019-08-25 VITALS — Temp 97.6°F

## 2019-08-25 DIAGNOSIS — M2041 Other hammer toe(s) (acquired), right foot: Secondary | ICD-10-CM

## 2019-08-25 DIAGNOSIS — Z9889 Other specified postprocedural states: Secondary | ICD-10-CM

## 2019-08-25 NOTE — Progress Notes (Signed)
  Subjective:  Patient ID: Melissa [name], female    DOB: April 15, 1975,  MRN: [id number]  Chief Complaint  Patient presents with  . Routine Post Op    DOS 07/10/19 HAMMERTOE REPAIR 2ND & 3RD TOES RT; pt stated, "I am doing much better, but has some discomfort in 3rd toe"    DOS: 07/10/2019 Procedure: Right foot second and third hammertoe repair  44 y.o. female returns for post-op check.  She states that she has had multiple surgeries on her right foot.  She is concerned that now the second and third toes are still too long for her and they do not follow the natural parabola of the toes that the left foot has.  She states she wants "her toes and her foot to look normal".  The most painful area is on the lateral third toe.  She states that she wants the toes to be shorter and she would like to have another surgery.  Review of Systems: Negative except as noted in the HPI. Denies N/V/F/Ch.   Objective:   Vitals:   08/25/19 0956  Temp: 97.6 F (36.4 C)   There is no height or weight on file to calculate BMI. Constitutional Well developed. Well nourished.  Vascular Foot warm and well perfused. Capillary refill normal to all digits.   Neurologic Normal speech. Oriented to person, place, and time. Epicritic sensation to light touch grossly present bilaterally.  Dermatologic  skin incisions are well-healed  Orthopedic:  She has tenderness to the lateral third toe at the level of the distal portion of the proximal phalanx.  Range of motion at the arthroplasty site in the MTPJ is intact.  The second and third toes are slightly longer than the corresponding problem the left foot but not excessively.  The fourth toe has a slight adductovarus attitude.   Radiographs: New weightbearing radiographs of the right foot were taken today.  Overall postoperative alignment is maintained.  There has been arthroplasty of previous digital fusion sites of the PIP and DIPJ's.  There are some prominent bone on  the lateral third proximal phalanx distally Assessment:   1. Hammertoe of right foot   2. Status post right foot surgery    Plan:  Patient was evaluated and treated and all questions answered.  S/p foot surgery right -She states she is concerned and unhappy about the appearance of her toes.  She states she would like to have this corrected surgically again.  I discussed with her that would be in her best interest to wait at least 3 months but preferably up to 6 months following the last surgery before proceeding for revision surgery.  Also discussed with her that it would be best for her to discuss this again with Dr. Milinda Pointer in detail.  She states that the most one thing for her is her toes to "look normal".  I discussed with her that there is a good chance that they may never look normal.  I think the most important goal for her would be to have functioning and pain-free digits. -I will have her see Dr. Milinda Pointer in the next 2 to 4 weeks to discuss the above, and I will see her in 6 to 8 weeks which will be 3 months after surgery -Continue WBAT in regular shoe gear   Return in about 6 weeks (around 10/06/2019).

## 2019-09-11 NOTE — Progress Notes (Signed)
[place] Phone: [phone] Subjective:   [name], am serving as a scribe for Dr. Hulan Saas. This visit occurred during the SARS-CoV-2 public health emergency.  Safety protocols were in place, including screening questions prior to the visit, additional usage of staff PPE, and extensive cleaning of exam room while observing appropriate contact time as indicated for disinfecting solutions.   I'm seeing this patient by the request  of:  Marrian Salvage, FNP  CC: Bilateral wrist pain follow-up  WCB:[id number]   06/15/2019 Bilateral injections given today, tolerated the procedure well, discussed icing regimen and home exercises.  Patient has had EMGs done showing that this is mostly carpal tunnel and not as much of the cervical radiculopathy.  We can always repeat of the epidurals in the neck if necessary.  Discussed bracing at night.  Gabapentin.  Chronic problem with exacerbation.  Follow-up again in 3 months  Update 09/15/2019 Melissa [name] is a 44 y.o. female coming in with complaint of bilateral carpal tunnel. Patient states that she has been having pain in both wrists. Pain is increasing.  Patient states that it is affecting daily activities, starting to have chronic numbness again.  Patient does not know if it is her neck or her wrist again.  States that the best benefit she has had though has been the injections in the carpal tunnel area last visit     Past Medical History:  Diagnosis Date  . Anxiety   . Arthritis   . Carpal tunnel syndrome   . Depression   . GERD (gastroesophageal reflux disease)   . Hammer toe 04/2013   bilateral 4th and 5th   . Ingrown left big toenail 04/2013  . Mental disorder   . Neuromuscular disorder ([place])   . Post term pregnancy over 40 weeks 09/21/2015  . SVD (spontaneous vaginal delivery) 09/22/2015  . UTI (lower urinary tract infection)    Past Surgical  History:  Procedure Laterality Date  . BREAST LIFT Bilateral   . CORRECTION HAMMER TOE Bilateral   . CRYOABLATION  2004  . CYSTOSCOPY WITH HYDRODISTENSION AND BIOPSY  07/28/2004   with urethral dilatation  . DILATION AND CURETTAGE OF UTERUS    . DILATION AND EVACUATION  12/21/2011   Procedure: DILATATION AND EVACUATION;  Surgeon: Terrance Mass, MD;  Location: [facility];  Service: Gynecology;  Laterality: N/A;  first trimester   . HAMMER TOE SURGERY Bilateral 04/20/2013   Procedure: BILATERAL HAMMER TOE REPAIR AND EXCISION NAIL PERMANENT LEFT FIRST;  Surgeon: Harriet Masson, DPM;  Location: [place];  Service: Podiatry;  Laterality: Bilateral;  Hammer toe repair fourth and fifth toes bilateral feet with screw placement fourth toes and partial nail excision left great toe  . HYSTEROSALPINGOGRAM    . TOE SURGERY     ingrown toenail  . WISDOM TOOTH EXTRACTION     Social History   Socioeconomic History  . Marital status: Single    Spouse name: Not on file  . Number of children: 1  . Years of education: Not on file  . Highest education level: Not on file  Occupational History  . Occupation: Homemaker  Tobacco Use  . Smoking status: Never Smoker  . Smokeless tobacco: Never Used  Vaping Use  . Vaping Use: Never used  Substance and Sexual Activity  . Alcohol use: Yes    Comment: occasionally  . Drug use: No  .  Sexual activity: Yes    Birth control/protection: I.U.D.  Other Topics Concern  . Not on file  Social History Narrative  . Not on file   Social Determinants of Health   Financial Resource Strain:   . Difficulty of Paying Living Expenses: Not on file  Food Insecurity:   . Worried About Charity fundraiser in the Last Year: Not on file  . Ran Out of Food in the Last Year: Not on file  Transportation Needs:   . Lack of Transportation (Medical): Not on file  . Lack of Transportation (Non-Medical): Not on file  Physical Activity:   .  Days of Exercise per Week: Not on file  . Minutes of Exercise per Session: Not on file  Stress:   . Feeling of Stress : Not on file  Social Connections:   . Frequency of Communication with Friends and Family: Not on file  . Frequency of Social Gatherings with Friends and Family: Not on file  . Attends Religious Services: Not on file  . Active Member of Clubs or Organizations: Not on file  . Attends Archivist Meetings: Not on file  . Marital Status: Not on file   No Known Allergies Family History  Problem Relation Age of Onset  . Lung cancer Father   . Lung cancer Paternal Grandfather   . Seizures Mother   . Arthritis Mother   . Thyroid disease Maternal Grandmother   . Heart disease Paternal Grandmother        2 CABG    Current Outpatient Medications (Endocrine & Metabolic):  .  levonorgestrel (MIRENA) 20 MCG/24HR IUD, 1 each by Intrauterine route once. Marland Kitchen  levothyroxine (SYNTHROID) 75 MCG tablet, Take 1 tablet (75 mcg total) by mouth daily before breakfast.   Current Outpatient Medications (Respiratory):  .  fluticasone (FLONASE) 50 MCG/ACT nasal spray, Use 2 spray(s) in each nostril once daily  Current Outpatient Medications (Analgesics):  .  celecoxib (CELEBREX) 200 MG capsule, One to 2 tablets by mouth daily as needed for pain. Marland Kitchen  ibuprofen (ADVIL) 800 MG tablet, Take 1 tablet by mouth twice daily (Patient taking differently: as needed. ) .  meloxicam (MOBIC) 15 MG tablet, Take 1 tablet by mouth once daily .  naproxen (NAPROSYN) 500 MG tablet, Take 1 tablet by mouth twice daily as needed  Current Outpatient Medications (Hematological):  .  tranexamic acid (LYSTEDA) 650 MG TABS tablet, Take 650 mg by mouth daily.  Current Outpatient Medications (Other):  .  amphetamine-dextroamphetamine (ADDERALL) 20 MG tablet, TAKE 1 TABLET BY MOUTH IN THE MORNING AND 1 EVERYDAY AT NOON AND 1 TABLET AT 4PM .  buPROPion (WELLBUTRIN XL) 150 MG 24 hr tablet, Take 450 mg by mouth  daily. .  clobetasol (TEMOVATE) 0.05 % external solution, APPLY SOLUTION TOPICALLY TO AFFECTED AREA UP TO TWICE DAILY AS NEEDED (NOT TO FACE GROIN AND AXILLA) .  escitalopram (LEXAPRO) 20 MG tablet, Take 20 mg by mouth every morning. .  famotidine (PEPCID) 20 MG tablet, Take 1 tablet (20 mg total) by mouth 2 (two) times daily. Marland Kitchen  gabapentin (NEURONTIN) 100 MG capsule, Take 1 capsule (100 mg total) by mouth at bedtime. .  hydrOXYzine (ATARAX/VISTARIL) 10 MG tablet, Take 1 tablet (10 mg total) by mouth 3 (three) times daily as needed for itching. for anxiety .  lansoprazole (PREVACID SOLUTAB) 30 MG disintegrating tablet, Take 1 tablet (30 mg total) by mouth in the morning and at bedtime. .  ondansetron (ZOFRAN) 4 MG  tablet, Take 1 tablet (4 mg total) by mouth every 8 (eight) hours as needed. .  Salicylic Acid 3 % SHAM, Use daily as directed for dry scalp .  selenium sulfide (SELSUN) 2.5 % shampoo, USE  LOTION EXTERNALLY TO AFFECTED AREA ONCE DAILY AS NEEDED FOR  IRRITATION .  Vilazodone HCl 20 MG TABS, Take 1 tablet (20 mg total) by mouth daily. .  Vitamin D, Ergocalciferol, (DRISDOL) 1.25 MG (50000 UNIT) CAPS capsule, Take 1 capsule (50,000 Units total) by mouth every 7 (seven) days.   Reviewed prior external information including notes and imaging from  primary care provider As well as notes that were available from care everywhere and other healthcare systems.  Past medical history, social, surgical and family history all reviewed in electronic medical record.  No pertanent information unless stated regarding to the chief complaint.   Review of Systems:  No headache, visual changes, nausea, vomiting, diarrhea, constipation, dizziness, abdominal pain, skin rash, fevers, chills, night sweats, weight loss, swollen lymph nodesjoint swelling, chest pain, shortness of breath, mood changes. POSITIVE muscle aches, body aches, numbness in the extremities  Objective  Blood pressure 120/88, pulse 94,  height 5\' 1"  (1.549 m), weight 150 lb (68 kg), SpO2 99 %.   General: No apparent distress alert and oriented x3 mood and affect normal, dressed appropriately.  HEENT: Pupils equal, extraocular movements intact  Respiratory: Patient's speak in full sentences and does not appear short of breath  Cardiovascular: No lower extremity edema, non tender, no erythema  Neuro: Cranial nerves II through XII are intact, neurovascularly intact in all extremities with 2+ DTRs and 2+ pulses.  Gait normal with good balance and coordination.  MSK: Hand exam shows the patient does have positive Tinel's bilaterally.  Positive Phalen's noted.  The patient does not have any significant weakness of the hands noted today.  Good range of motion.  Neck does have some mild decreased range of motion with extension lacking the last 5 degrees but negative Spurling's today  Procedure: Real-time Ultrasound Guided Injection of right carpal tunnel Device: GE Logiq Q7  Ultrasound guided injection is preferred based studies that show increased duration, increased effect, greater accuracy, decreased procedural pain, increased response rate with ultrasound guided versus blind injection.  Verbal informed consent obtained.  Time-out conducted.  Noted no overlying erythema, induration, or other signs of local infection.  Skin prepped in a sterile fashion.  Local anesthesia: Topical Ethyl chloride.  With sterile technique and under real time ultrasound guidance:  median nerve visualized.  23g 5/8 inch needle inserted distal to proximal approach into nerve sheath. Pictures taken nfor needle placement. Patient did have injection of 2 cc of 1% lidocaine, 1 cc of 0.5% Marcaine, and 1 cc of Kenalog 40 mg/dL. Completed without difficulty  Pain immediately resolved suggesting accurate placement of the medication.  Advised to call if fevers/chills, erythema, induration, drainage, or persistent bleeding.  Impression: Technically successful  ultrasound guided injection.  Procedure: Real-time Ultrasound Guided Injection of left carpal tunnel Device: GE Logiq Q7 Ultrasound guided injection is preferred based studies that show increased duration, increased effect, greater accuracy, decreased procedural pain, increased response rate with ultrasound guided versus blind injection.  Verbal informed consent obtained.  Time-out conducted.  Noted no overlying erythema, induration, or other signs of local infection.  Skin prepped in a sterile fashion.  Local anesthesia: Topical Ethyl chloride.  With sterile technique and under real time ultrasound guidance:  median nerve visualized.  23g 5/8 inch needle inserted  distal to proximal approach into nerve sheath. Pictures taken nfor needle placement. Patient did have injection of 2 cc of 0.5% Marcaine, and 1 cc of Kenalog 40 mg/dL. Completed without difficulty  Pain immediately resolved suggesting accurate placement of the medication.  Advised to call if fevers/chills, erythema, induration, drainage, or persistent bleeding.  Impression: Technically successful ultrasound guided injection.   Impression and Recommendations:     The above documentation has been reviewed and is accurate and complete Lyndal Pulley, DO       Note: This dictation was prepared with Dragon dictation along with smaller phrase technology. Any transcriptional errors that result from this process are unintentional.

## 2019-09-14 ENCOUNTER — Ambulatory Visit: Payer: Medicare Other | Admitting: Family Medicine

## 2019-09-15 ENCOUNTER — Ambulatory Visit: Payer: Self-pay

## 2019-09-15 ENCOUNTER — Other Ambulatory Visit: Payer: Self-pay

## 2019-09-15 ENCOUNTER — Encounter: Payer: Self-pay | Admitting: Family Medicine

## 2019-09-15 ENCOUNTER — Ambulatory Visit (INDEPENDENT_AMBULATORY_CARE_PROVIDER_SITE_OTHER): Payer: Medicare Other | Admitting: Family Medicine

## 2019-09-15 VITALS — BP 120/88 | HR 94 | Ht 61.0 in | Wt 150.0 lb

## 2019-09-15 DIAGNOSIS — G5603 Carpal tunnel syndrome, bilateral upper limbs: Secondary | ICD-10-CM | POA: Diagnosis not present

## 2019-09-15 NOTE — Assessment & Plan Note (Addendum)
Discussed with patient with 3 injections over the course last 2 years we do need to consider the possibility of surgical intervention.  Patient wants to hold on referral at this time.  Continue bracing otherwise.  We discussed the medications with no significant changes including Celebrex, gabapentin intermittently use the naproxen as well as the vilazodone patient can follow-up with me again in 3 months but if pain comes back before this I would like to refer patient to hand surgery.  We did do bilateral injections with patient stating that she does have a driver with her today.

## 2019-09-15 NOTE — Patient Instructions (Addendum)
Injected both wrists today Continue brace at night See me again in 3 months If pain comes back will need to consider speaking with Dr. Amedeo Plenty

## 2019-09-22 ENCOUNTER — Encounter: Payer: Medicare Other | Admitting: Podiatry

## 2019-10-05 ENCOUNTER — Ambulatory Visit: Payer: Medicare Other | Admitting: Gastroenterology

## 2019-10-05 ENCOUNTER — Telehealth: Payer: Self-pay | Admitting: Family Medicine

## 2019-10-05 ENCOUNTER — Other Ambulatory Visit: Payer: Self-pay

## 2019-10-05 DIAGNOSIS — G5603 Carpal tunnel syndrome, bilateral upper limbs: Secondary | ICD-10-CM

## 2019-10-05 NOTE — Telephone Encounter (Signed)
Patient called stating that she has decided to continue with the carpel tunnel surgery. Would Dr Tamala Julian be able to refer her for that? (She also wanted to let Dr Tamala Julian know that she has started having blurry vision as well.)

## 2019-10-06 ENCOUNTER — Other Ambulatory Visit: Payer: Self-pay

## 2019-10-06 ENCOUNTER — Encounter: Payer: Medicare Other | Admitting: Podiatry

## 2019-10-06 ENCOUNTER — Other Ambulatory Visit: Payer: Self-pay | Admitting: Family Medicine

## 2019-10-06 MED ORDER — NAPROXEN 500 MG PO TABS
500.0000 mg | ORAL_TABLET | Freq: Two times a day (BID) | ORAL | 1 refills | Status: DC | PRN
Start: 2019-10-06 — End: 2021-06-09

## 2019-10-08 ENCOUNTER — Encounter: Payer: Medicare Other | Admitting: Podiatry

## 2019-10-12 ENCOUNTER — Ambulatory Visit: Payer: Medicare Other | Admitting: Family

## 2019-10-14 ENCOUNTER — Other Ambulatory Visit: Payer: Self-pay

## 2019-10-14 ENCOUNTER — Ambulatory Visit (INDEPENDENT_AMBULATORY_CARE_PROVIDER_SITE_OTHER): Payer: Medicare Other | Admitting: Family

## 2019-10-14 VITALS — BP 132/80 | HR 90 | Temp 98.6°F | Ht 61.0 in | Wt 151.8 lb

## 2019-10-14 DIAGNOSIS — N644 Mastodynia: Secondary | ICD-10-CM

## 2019-10-14 NOTE — Patient Instructions (Signed)
[facility], [address], [address], [place].  The phone number is [phone].  Her name is Dr. Marda Stalker [facility]).

## 2019-10-14 NOTE — Progress Notes (Signed)
Melissa [name] is a 44 y.o. female with the following history as recorded in EpicCare:  Patient Active Problem List   Diagnosis Date Noted  . Nonallopathic lesion of lumbosacral region 03/01/2019  . Nonallopathic lesion of cervical region 03/01/2019  . Nonallopathic lesion of sacral region 03/01/2019  . Nonallopathic lesion of thoracic region 03/01/2019  . Nonallopathic lesion of rib cage 56/28/2021  . Vitamin D deficiency 05/08/2018  . Neck pain 06/17/2017  . Right lateral epicondylitis 04/15/2017  . Piriformis syndrome, left 02/25/2017  . Vaginal discharge 09/11/2016  . Tendinitis of extensor tendon of right hand 06/11/2016  . Mixed stress and urge urinary incontinence 02/07/2016  . Carpal tunnel syndrome 01/03/2016  . Polyarthralgia 01/03/2016  . Galactocele associated with childbirth 11/07/2015  . Pregnancy with poor obstetric history 02/21/2015  . Multigravida of advanced maternal age 56/20/2017  . Radial tunnel syndrome 06/17/2014  . Cervical radiculopathy 06/10/2014  . Bilateral low back pain with right-sided sciatica 06/10/2014  . Bipolar depression ([place]) 05/17/2014  . Chronic night sweats 06/26/2013  . History of recurrent spontaneous abortion, not currently pregnant 01/26/2012  . Fluid retention 01/25/2012  . Obesity (BMI 30.0-34.9) 01/25/2012    Current Outpatient Medications  Medication Sig Dispense Refill  . clobetasol (TEMOVATE) 0.05 % external solution APPLY SOLUTION TOPICALLY TO AFFECTED AREA UP TO TWICE DAILY AS NEEDED (NOT TO FACE GROIN AND AXILLA)  3  . fluticasone (FLONASE) 50 MCG/ACT nasal spray Use 2 spray(s) in each nostril once daily 16 g 11  . ibuprofen (ADVIL) 800 MG tablet Take 1 tablet by mouth twice daily (Patient taking differently: as needed. ) 180 tablet 0  . levonorgestrel (MIRENA) 20 MCG/24HR IUD 1 each by Intrauterine route once.    . selenium sulfide (SELSUN) 2.5 % shampoo USE  LOTION EXTERNALLY TO AFFECTED AREA ONCE DAILY AS NEEDED FOR   IRRITATION 120 mL 0  . Vitamin D, Ergocalciferol, (DRISDOL) 1.25 MG (50000 UNIT) CAPS capsule Take 1 capsule (50,000 Units total) by mouth every 7 (seven) days. 12 capsule 0  . amphetamine-dextroamphetamine (ADDERALL) 20 MG tablet TAKE 1 TABLET BY MOUTH IN THE MORNING AND 1 EVERYDAY AT NOON AND 1 TABLET AT 4PM (Patient not taking: Reported on 10/14/2019)  0  . buPROPion (WELLBUTRIN XL) 150 MG 24 hr tablet Take 450 mg by mouth daily. (Patient not taking: Reported on 10/14/2019)    . celecoxib (CELEBREX) 200 MG capsule One to 2 tablets by mouth daily as needed for pain. (Patient not taking: Reported on 10/14/2019) 60 capsule 2  . escitalopram (LEXAPRO) 20 MG tablet Take 20 mg by mouth every morning. (Patient not taking: Reported on 10/14/2019)  12  . famotidine (PEPCID) 20 MG tablet Take 1 tablet (20 mg total) by mouth 2 (two) times daily. (Patient not taking: Reported on 10/14/2019) 20 tablet 0  . gabapentin (NEURONTIN) 100 MG capsule Take 1 capsule (100 mg total) by mouth at bedtime. (Patient not taking: Reported on 10/14/2019) 90 capsule 3  . hydrOXYzine (ATARAX/VISTARIL) 10 MG tablet Take 1 tablet (10 mg total) by mouth 3 (three) times daily as needed for itching. for anxiety (Patient not taking: Reported on 10/14/2019) 30 tablet 0  . lansoprazole (PREVACID SOLUTAB) 30 MG disintegrating tablet Take 1 tablet (30 mg total) by mouth in the morning and at bedtime. (Patient not taking: Reported on 10/14/2019) 60 tablet 3  . levothyroxine (SYNTHROID) 75 MCG tablet Take 1 tablet (75 mcg total) by mouth daily before breakfast. (Patient not taking: Reported on 10/14/2019)  30 tablet 3  . meloxicam (MOBIC) 15 MG tablet Take 1 tablet by mouth once daily (Patient not taking: Reported on 10/14/2019) 30 tablet 0  . naproxen (NAPROSYN) 500 MG tablet Take 1 tablet (500 mg total) by mouth 2 (two) times daily as needed. (Patient not taking: Reported on 10/14/2019) 180 tablet 1  . ondansetron (ZOFRAN) 4 MG tablet Take 1  tablet (4 mg total) by mouth every 8 (eight) hours as needed. (Patient not taking: Reported on 10/14/2019) 20 tablet 0  . Salicylic Acid 3 % SHAM Use daily as directed for dry scalp (Patient not taking: Reported on 10/14/2019) 236 mL 1  . tranexamic acid (LYSTEDA) 650 MG TABS tablet Take 650 mg by mouth daily. (Patient not taking: Reported on 10/14/2019)  0  . Vilazodone HCl 20 MG TABS Take 1 tablet (20 mg total) by mouth daily. (Patient not taking: Reported on 10/14/2019) 30 tablet 1   No current facility-administered medications for this visit.    Allergies: Patient has no known allergies.  Past Medical History:  Diagnosis Date  . Anxiety   . Arthritis   . Carpal tunnel syndrome   . Depression   . GERD (gastroesophageal reflux disease)   . Hammer toe 04/2013   bilateral 4th and 5th   . Ingrown left big toenail 04/2013  . Mental disorder   . Neuromuscular disorder ([place])   . Post term pregnancy over 40 weeks 09/21/2015  . SVD (spontaneous vaginal delivery) 09/22/2015  . UTI (lower urinary tract infection)     Past Surgical History:  Procedure Laterality Date  . BREAST LIFT Bilateral   . CORRECTION HAMMER TOE Bilateral   . CRYOABLATION  2004  . CYSTOSCOPY WITH HYDRODISTENSION AND BIOPSY  07/28/2004   with urethral dilatation  . DILATION AND CURETTAGE OF UTERUS    . DILATION AND EVACUATION  12/21/2011   Procedure: DILATATION AND EVACUATION;  Surgeon: Terrance Mass, MD;  Location[facility];  Service: Gynecology;  Laterality: N/A;  first trimester   . HAMMER TOE SURGERY Bilateral 04/20/2013   Procedure: BILATERAL HAMMER TOE REPAIR AND EXCISION NAIL PERMANENT LEFT FIRST;  Surgeon: Harriet Masson, DPM;  Location: [facility];  Service: Podiatry;  Laterality: Bilateral;  Hammer toe repair fourth and fifth toes bilateral feet with screw placement fourth toes and partial nail excision left great toe  . HYSTEROSALPINGOGRAM    . TOE SURGERY     ingrown toenail   . WISDOM TOOTH EXTRACTION      Family History  Problem Relation Age of Onset  . Lung cancer Father   . Lung cancer Paternal Grandfather   . Seizures Mother   . Arthritis Mother   . Thyroid disease Maternal Grandmother   . Heart disease Paternal Grandmother        2 CABG    Social History   Tobacco Use  . Smoking status: Never Smoker  . Smokeless tobacco: Never Used  Substance Use Topics  . Alcohol use: Yes    Comment: occasionally    Subjective:  Patient notes that her trainer wanted her to ask about getting prescriptions for Phentermine or Qsymia; she feels like she has plateaued in her weight loss goals;  Also mentions bilateral breast pain x 1 year since she had breast surgery to lift her breasts; does not have plastic surgeon in this country; notes that no complications from surgery at time it was done;   Objective:  Vitals:   10/14/19 0932  BP: 132/80  Pulse: 90  Temp: 98.6 F (37 C)  TempSrc: Oral  SpO2: 99%  Weight: 151 lb 12.8 oz (68.9 kg)  Height: 5\' 1"  (1.549 m)    General: Well developed, well nourished, in no acute distress  Head: Normocephalic and atraumatic  Lungs: Respirations unlabored;  Neurologic: Alert and oriented; speech intact; face symmetrical; moves all extremities well; CNII-XII intact without focal deficit   Assessment:  1. Breast pain     Plan:  Refer for diagnostic mammogram; keep planned follow-up with her GYN;  Explained to patient that in general, I do not feel comfortable writing weight loss medications- she is given contact information for weight loss provider;   This visit occurred during the SARS-CoV-2 public health emergency.  Safety protocols were in place, including screening questions prior to the visit, additional usage of staff PPE, and extensive cleaning of exam room while observing appropriate contact time as indicated for disinfecting solutions.    No follow-ups on file.  Orders Placed This Encounter  Procedures  .  MM Digital Diagnostic Bilat    Standing Status:   Future    Standing Expiration Date:   10/13/2020    Order Specific Question:   Reason for Exam (SYMPTOM  OR DIAGNOSIS REQUIRED)    Answer:   bilateral breast pain; history of breast lift x 1 year;    Order Specific Question:   Is the patient pregnant?    Answer:   No    Order Specific Question:   Preferred imaging location?    Answer:   [facility]    Requested Prescriptions    No prescriptions requested or ordered in this encounter

## 2019-10-15 ENCOUNTER — Other Ambulatory Visit: Payer: Self-pay

## 2019-10-15 ENCOUNTER — Other Ambulatory Visit: Payer: Self-pay | Admitting: Family Medicine

## 2019-10-15 ENCOUNTER — Telehealth: Payer: Self-pay

## 2019-10-15 MED ORDER — AMMONIUM LACTATE 12 % EX LOTN: 1.0000 "application " | TOPICAL_LOTION | CUTANEOUS | 5 refills | Status: DC | PRN

## 2019-10-15 MED ORDER — VITAMIN D (ERGOCALCIFEROL) 1.25 MG (50000 UNIT) PO CAPS: 50000.0000 [IU] | ORAL_CAPSULE | ORAL | 0 refills | Status: DC

## 2019-10-15 NOTE — Telephone Encounter (Signed)
Rx sent to [facility] on [address]

## 2019-10-15 NOTE — Telephone Encounter (Signed)
Patient called, requesting an Rx for ammonium lactate. She said you prescribed it for her a long time ago.  Thanks

## 2019-10-20 DIAGNOSIS — Z20822 Contact with and (suspected) exposure to covid-19: Secondary | ICD-10-CM | POA: Diagnosis not present

## 2019-10-21 ENCOUNTER — Other Ambulatory Visit: Payer: Self-pay | Admitting: Family

## 2019-10-21 DIAGNOSIS — N644 Mastodynia: Secondary | ICD-10-CM

## 2019-10-23 NOTE — Progress Notes (Signed)
This encounter was created in error - please disregard.

## 2019-10-27 ENCOUNTER — Encounter: Payer: Medicare Other | Admitting: Podiatry

## 2019-10-27 DIAGNOSIS — Z1231 Encounter for screening mammogram for malignant neoplasm of breast: Secondary | ICD-10-CM | POA: Diagnosis not present

## 2019-10-27 DIAGNOSIS — R300 Dysuria: Secondary | ICD-10-CM | POA: Diagnosis not present

## 2019-10-28 ENCOUNTER — Other Ambulatory Visit: Payer: Self-pay | Admitting: Family

## 2019-11-03 ENCOUNTER — Encounter: Payer: Medicare Other | Admitting: Podiatry

## 2019-11-03 LAB — HM PAP SMEAR: HPV 16/18/45 genotyping: NEGATIVE

## 2019-11-04 ENCOUNTER — Telehealth: Payer: Self-pay | Admitting: Family

## 2019-11-04 NOTE — Telephone Encounter (Signed)
    Patient calling to report fluticasone (FLONASE) 50 MCG/ACT nasal spray is not working,stuffy nose worse at night

## 2019-11-05 ENCOUNTER — Telehealth: Payer: Self-pay

## 2019-11-05 ENCOUNTER — Other Ambulatory Visit: Payer: Self-pay | Admitting: Family Medicine

## 2019-11-05 MED ORDER — AZELASTINE HCL 0.1 % NA SOLN: 2.0000 | Freq: Two times a day (BID) | NASAL | 12 refills | Status: DC

## 2019-11-05 NOTE — Telephone Encounter (Signed)
Informed patient of the addition.

## 2019-11-05 NOTE — Telephone Encounter (Signed)
Will send in Astelin for her which is a nasal antihistamine; she can use this with her Flonase. The Astelin is twice a day.

## 2019-11-12 ENCOUNTER — Other Ambulatory Visit: Payer: Medicare Other

## 2019-11-18 ENCOUNTER — Other Ambulatory Visit: Payer: Self-pay | Admitting: Obstetrics and Gynecology

## 2019-11-18 ENCOUNTER — Other Ambulatory Visit: Payer: Self-pay | Admitting: Family

## 2019-11-18 DIAGNOSIS — R928 Other abnormal and inconclusive findings on diagnostic imaging of breast: Secondary | ICD-10-CM

## 2019-11-25 DIAGNOSIS — Z20822 Contact with and (suspected) exposure to covid-19: Secondary | ICD-10-CM | POA: Diagnosis not present

## 2019-12-07 ENCOUNTER — Ambulatory Visit: Payer: Medicare Other | Admitting: Gastroenterology

## 2019-12-09 ENCOUNTER — Ambulatory Visit
Admission: RE | Admit: 2019-12-09 | Discharge: 2019-12-09 | Disposition: A | Discharge status: Home / Self Care | Payer: Medicare Other | Source: Ambulatory Visit | Attending: Family | Admitting: Family

## 2019-12-09 ENCOUNTER — Ambulatory Visit: Payer: Medicare Other

## 2019-12-09 ENCOUNTER — Other Ambulatory Visit: Payer: Self-pay | Admitting: Family

## 2019-12-09 ENCOUNTER — Other Ambulatory Visit: Payer: Self-pay

## 2019-12-09 DIAGNOSIS — N6314 Unspecified lump in the right breast, lower inner quadrant: Secondary | ICD-10-CM | POA: Diagnosis not present

## 2019-12-09 DIAGNOSIS — R928 Other abnormal and inconclusive findings on diagnostic imaging of breast: Secondary | ICD-10-CM

## 2019-12-09 DIAGNOSIS — R921 Mammographic calcification found on diagnostic imaging of breast: Secondary | ICD-10-CM | POA: Diagnosis not present

## 2019-12-15 ENCOUNTER — Ambulatory Visit (INDEPENDENT_AMBULATORY_CARE_PROVIDER_SITE_OTHER): Payer: Medicare Other | Admitting: Family Medicine

## 2019-12-15 ENCOUNTER — Encounter: Payer: Self-pay | Admitting: Family Medicine

## 2019-12-15 ENCOUNTER — Other Ambulatory Visit: Payer: Self-pay

## 2019-12-15 DIAGNOSIS — G5601 Carpal tunnel syndrome, right upper limb: Secondary | ICD-10-CM

## 2019-12-15 NOTE — Progress Notes (Signed)
Virtual Visit via Video Note  I connected with Melissa [name] on 12/15/19 at  4:00 PM EST by a video enabled telemedicine application and verified that I am speaking with the correct person using two identifiers.  Patient is alone, phone call difficulty with virtual platform  Location: Patient: Patient is in home setting Provider: In office setting   I discussed the limitations of evaluation and management by telemedicine and the availability of in person appointments. The patient expressed understanding and agreed to proceed.  History of Present Illness: Patient is a 44 year old female who does have what seems to be more of a bilateral carpal tunnel syndrome.  Patient was referred for surgical intervention but missed her appointment.  Has a wait till 8 weeks.  Would like another referral.  Still having pain on a daily basis.  Continues to have numbness.    Observations/Objective: Alert and oriented x3. Difficulty with virtual platform.  Assessment and Plan: 44 year old female with bilateral carpal tunnel syndrome that seems to be worsening.  Will refer to orthopedic surgery for intervention.   Follow Up Instructions: As needed    I discussed the assessment and treatment plan with the patient. The patient was provided an opportunity to ask questions and all were answered. The patient agreed with the plan and demonstrated an understanding of the instructions.   The patient was advised to call back or seek an in-person evaluation if the symptoms worsen or if the condition fails to improve as anticipated.  I provided 15 minutes of non-face-to-face time during this encounter.   Lyndal Pulley, DO

## 2019-12-16 ENCOUNTER — Other Ambulatory Visit: Payer: Self-pay

## 2019-12-16 ENCOUNTER — Other Ambulatory Visit: Payer: Medicare Other

## 2019-12-16 DIAGNOSIS — G5601 Carpal tunnel syndrome, right upper limb: Secondary | ICD-10-CM

## 2019-12-18 ENCOUNTER — Other Ambulatory Visit: Payer: Self-pay | Admitting: Family

## 2019-12-18 DIAGNOSIS — R928 Other abnormal and inconclusive findings on diagnostic imaging of breast: Secondary | ICD-10-CM

## 2019-12-24 DIAGNOSIS — G5601 Carpal tunnel syndrome, right upper limb: Secondary | ICD-10-CM | POA: Diagnosis not present

## 2019-12-24 DIAGNOSIS — G5602 Carpal tunnel syndrome, left upper limb: Secondary | ICD-10-CM | POA: Diagnosis not present

## 2020-01-08 DIAGNOSIS — G5601 Carpal tunnel syndrome, right upper limb: Secondary | ICD-10-CM | POA: Diagnosis not present

## 2020-01-14 ENCOUNTER — Ambulatory Visit: Payer: Medicare Other

## 2020-01-14 ENCOUNTER — Other Ambulatory Visit: Payer: Self-pay

## 2020-01-14 ENCOUNTER — Ambulatory Visit
Admission: RE | Admit: 2020-01-14 | Discharge: 2020-01-14 | Disposition: A | Discharge status: Home / Self Care | Payer: Medicare Other | Source: Ambulatory Visit | Attending: Family | Admitting: Family

## 2020-01-14 DIAGNOSIS — R928 Other abnormal and inconclusive findings on diagnostic imaging of breast: Secondary | ICD-10-CM

## 2020-01-14 DIAGNOSIS — N6031 Fibrosclerosis of right breast: Secondary | ICD-10-CM | POA: Diagnosis not present

## 2020-01-14 DIAGNOSIS — R590 Localized enlarged lymph nodes: Secondary | ICD-10-CM | POA: Diagnosis not present

## 2020-01-14 DIAGNOSIS — N6314 Unspecified lump in the right breast, lower inner quadrant: Secondary | ICD-10-CM | POA: Diagnosis not present

## 2020-01-14 DIAGNOSIS — M2040 Other hammer toe(s) (acquired), unspecified foot: Secondary | ICD-10-CM

## 2020-01-20 ENCOUNTER — Telehealth: Payer: Self-pay | Admitting: Family

## 2020-01-20 NOTE — Telephone Encounter (Signed)
Patient called and said that her insurance company told her that she would need a prescription for an at home Covid 19 test and she was wondering if one could be written for her. She can be reached at [phone].

## 2020-01-20 NOTE — Telephone Encounter (Signed)
There is no prescription to write for the test. She can get free tests mailed to her from the [place] government. Google this information and follow directions.

## 2020-01-22 ENCOUNTER — Other Ambulatory Visit: Payer: Self-pay | Admitting: Family

## 2020-01-22 MED ORDER — COVID-19 HOME COLLECTION TEST VI KIT: PACK | 0 refills | Status: DC

## 2020-01-27 NOTE — Telephone Encounter (Signed)
Pt notified that prescriptions for her & her mother are waiting at front desk.  Pt states she will come to pick it up tomorrow.

## 2020-02-01 ENCOUNTER — Ambulatory Visit (INDEPENDENT_AMBULATORY_CARE_PROVIDER_SITE_OTHER): Payer: Medicare Other | Admitting: Family

## 2020-02-01 ENCOUNTER — Ambulatory Visit (INDEPENDENT_AMBULATORY_CARE_PROVIDER_SITE_OTHER): Payer: Medicare Other

## 2020-02-01 ENCOUNTER — Encounter: Payer: Self-pay | Admitting: Family

## 2020-02-01 ENCOUNTER — Other Ambulatory Visit: Payer: Self-pay

## 2020-02-01 VITALS — BP 124/82 | HR 83 | Temp 98.0°F | Ht 61.0 in | Wt 154.0 lb

## 2020-02-01 DIAGNOSIS — M79641 Pain in right hand: Secondary | ICD-10-CM | POA: Diagnosis not present

## 2020-02-01 DIAGNOSIS — M7989 Other specified soft tissue disorders: Secondary | ICD-10-CM | POA: Diagnosis not present

## 2020-02-01 DIAGNOSIS — M79644 Pain in right finger(s): Secondary | ICD-10-CM | POA: Diagnosis not present

## 2020-02-01 MED ORDER — SULFAMETHOXAZOLE-TRIMETHOPRIM 800-160 MG PO TABS: 1.0000 | ORAL_TABLET | Freq: Two times a day (BID) | ORAL | 0 refills | Status: DC

## 2020-02-01 NOTE — Progress Notes (Signed)
Melissa [name] is a 45 y.o. female with the following history as recorded in EpicCare:  Patient Active Problem List   Diagnosis Date Noted  . Nonallopathic lesion of lumbosacral region 03/01/2019  . Nonallopathic lesion of cervical region 03/01/2019  . Nonallopathic lesion of sacral region 03/01/2019  . Nonallopathic lesion of thoracic region 03/01/2019  . Nonallopathic lesion of rib cage 66/28/2021  . Vitamin D deficiency 05/08/2018  . Neck pain 06/17/2017  . Right lateral epicondylitis 04/15/2017  . Piriformis syndrome, left 02/25/2017  . Vaginal discharge 09/11/2016  . Tendinitis of extensor tendon of right hand 06/11/2016  . Mixed stress and urge urinary incontinence 02/07/2016  . Carpal tunnel syndrome 01/03/2016  . Polyarthralgia 01/03/2016  . Galactocele associated with childbirth 11/07/2015  . Pregnancy with poor obstetric history 02/21/2015  . Multigravida of advanced maternal age 66/20/2017  . Radial tunnel syndrome 06/17/2014  . Cervical radiculopathy 06/10/2014  . Bilateral low back pain with right-sided sciatica 06/10/2014  . Bipolar depression ([place]) 05/17/2014  . Chronic night sweats 06/26/2013  . History of recurrent spontaneous abortion, not currently pregnant 01/26/2012  . Fluid retention 01/25/2012  . Obesity (BMI 30.0-34.9) 01/25/2012    Current Outpatient Medications  Medication Sig Dispense Refill  . ammonium lactate (LAC-HYDRIN) 12 % lotion Apply 1 application topically as needed for dry skin. 400 g 5  . amphetamine-dextroamphetamine (ADDERALL) 20 MG tablet TAKE 1 TABLET BY MOUTH IN THE MORNING AND 1 EVERYDAY AT NOON AND 1 TABLET AT 4PM  0  . azelastine (ASTELIN) 0.1 % nasal spray Place 2 sprays into both nostrils 2 (two) times daily. Use in each nostril as directed 30 mL 12  . fluticasone (FLONASE) 50 MCG/ACT nasal spray Use 2 spray(s) in each nostril once daily 16 g 11  . ibuprofen (ADVIL) 800 MG tablet Take 1 tablet by mouth twice daily (Patient taking  differently: as needed.) 180 tablet 0  . levonorgestrel (MIRENA) 20 MCG/24HR IUD 1 each by Intrauterine route once.    . naproxen (NAPROSYN) 500 MG tablet Take 1 tablet (500 mg total) by mouth 2 (two) times daily as needed. 180 tablet 1  . ondansetron (ZOFRAN) 4 MG tablet Take 1 tablet (4 mg total) by mouth every 8 (eight) hours as needed. 20 tablet 0  . Salicylic Acid 3 % SHAM Use daily as directed for dry scalp 236 mL 1  . selenium sulfide (SELSUN) 2.5 % shampoo USE  LOTION EXTERNALLY TO AFFECTED AREA ONCE DAILY AS NEEDED FOR  IRRITATION 120 mL 0  . sulfamethoxazole-trimethoprim (BACTRIM DS) 800-160 MG tablet Take 1 tablet by mouth 2 (two) times daily. 10 tablet 0  . Vitamin D, Ergocalciferol, (DRISDOL) 1.25 MG (50000 UNIT) CAPS capsule Take 1 capsule (50,000 Units total) by mouth every 7 (seven) days. 12 capsule 0   No current facility-administered medications for this visit.    Allergies: Patient has no known allergies.  Past Medical History:  Diagnosis Date  . Anxiety   . Arthritis   . Carpal tunnel syndrome   . Depression   . GERD (gastroesophageal reflux disease)   . Hammer toe 04/2013   bilateral 4th and 5th   . Ingrown left big toenail 04/2013  . Mental disorder   . Neuromuscular disorder ([place])   . Post term pregnancy over 40 weeks 09/21/2015  . SVD (spontaneous vaginal delivery) 09/22/2015  . UTI (lower urinary tract infection)     Past Surgical History:  Procedure Laterality Date  . BREAST LIFT Bilateral   .  CORRECTION HAMMER TOE Bilateral   . CRYOABLATION  2004  . CYSTOSCOPY WITH HYDRODISTENSION AND BIOPSY  07/28/2004   with urethral dilatation  . DILATION AND CURETTAGE OF UTERUS    . DILATION AND EVACUATION  12/21/2011   Procedure: DILATATION AND EVACUATION;  Surgeon: Terrance Mass, MD;  Location: [facility];  Service: Gynecology;  Laterality: N/A;  first trimester   . HAMMER TOE SURGERY Bilateral 04/20/2013   Procedure: BILATERAL HAMMER TOE REPAIR AND  EXCISION NAIL PERMANENT LEFT FIRST;  Surgeon: Harriet Masson, DPM;  Location: [place];  Service: Podiatry;  Laterality: Bilateral;  Hammer toe repair fourth and fifth toes bilateral feet with screw placement fourth toes and partial nail excision left great toe  . HYSTEROSALPINGOGRAM    . REDUCTION MAMMAPLASTY Bilateral 2020  . TOE SURGERY     ingrown toenail  . WISDOM TOOTH EXTRACTION      Family History  Problem Relation Age of Onset  . Lung cancer Father   . Lung cancer Paternal Grandfather   . Seizures Mother   . Arthritis Mother   . Thyroid disease Maternal Grandmother   . Heart disease Paternal Grandmother        2 CABG    Social History   Tobacco Use  . Smoking status: Never Smoker  . Smokeless tobacco: Never Used  Substance Use Topics  . Alcohol use: Yes    Comment: occasionally    Subjective:   Injured her 4th finger right hand while doing yard work; notes she pulled out a splinter from the finger but is not sure if she got the entire splinter; is concerned about localized area of swelling on lateral side of tip of finger; no warmth or streaking noted on the finger itself; patient notes she has been doing Epsom salt soaks;    Objective:  Vitals:   02/01/20 1526  BP: 124/82  Pulse: 83  Temp: 98 F (36.7 C)  TempSrc: Oral  SpO2: 99%  Weight: 154 lb (69.9 kg)  Height: 5\' 1"  (1.549 m)    General: Well developed, well nourished, in no acute distress  Skin : Warm and dry.  Head: Normocephalic and atraumatic  Lungs: Respirations unlabored;  Neurologic: Alert and oriented; speech intact; face symmetrical; moves all extremities well; CNII-XII intact without focal deficit   Assessment:  1. Right hand pain     Plan:  Update Xray; epsom salt soaks; Rx for Bactrim Ds bid x 5 days; will need to see specialist if symptoms persist;  This visit occurred during the SARS-CoV-2 public health emergency.  Safety protocols were in place, including screening  questions prior to the visit, additional usage of staff PPE, and extensive cleaning of exam room while observing appropriate contact time as indicated for disinfecting solutions.     No follow-ups on file.  Orders Placed This Encounter  Procedures  . DG Hand Complete Right    Standing Status:   Future    Number of Occurrences:   1    Standing Expiration Date:   01/31/2021    Order Specific Question:   Reason for Exam (SYMPTOM  OR DIAGNOSIS REQUIRED)    Answer:   4th finger redness/ swelling    Order Specific Question:   Is patient pregnant?    Answer:   No    Order Specific Question:   Preferred imaging location?    Answer:   [facility]    Requested Prescriptions   Signed Prescriptions Disp  Refills  . sulfamethoxazole-trimethoprim (BACTRIM DS) 800-160 MG tablet 10 tablet 0    Sig: Take 1 tablet by mouth 2 (two) times daily.

## 2020-02-02 DIAGNOSIS — H04123 Dry eye syndrome of bilateral lacrimal glands: Secondary | ICD-10-CM | POA: Diagnosis not present

## 2020-02-04 DIAGNOSIS — Z20822 Contact with and (suspected) exposure to covid-19: Secondary | ICD-10-CM | POA: Diagnosis not present

## 2020-02-13 ENCOUNTER — Other Ambulatory Visit: Payer: Self-pay | Admitting: Family

## 2020-02-26 DIAGNOSIS — G5602 Carpal tunnel syndrome, left upper limb: Secondary | ICD-10-CM | POA: Diagnosis not present

## 2020-03-02 DIAGNOSIS — R300 Dysuria: Secondary | ICD-10-CM | POA: Diagnosis not present

## 2020-03-04 ENCOUNTER — Other Ambulatory Visit: Payer: Self-pay | Admitting: Surgery

## 2020-03-04 DIAGNOSIS — N644 Mastodynia: Secondary | ICD-10-CM

## 2020-03-08 ENCOUNTER — Telehealth: Payer: Self-pay | Admitting: Family

## 2020-03-08 ENCOUNTER — Other Ambulatory Visit: Payer: Self-pay | Admitting: Family

## 2020-03-08 NOTE — Telephone Encounter (Signed)
   Patient requesting referral to a hand specialist; pain in finger. Patient requesting call from [place] to discuss another matter

## 2020-03-09 ENCOUNTER — Telehealth: Payer: Self-pay | Admitting: Gastroenterology

## 2020-03-09 NOTE — Telephone Encounter (Signed)
Pt is requesting a call back from a nurse to discuss scheduling a repeat for an EGD

## 2020-03-09 NOTE — Telephone Encounter (Signed)
Pt c/o slow healing process that has been noted over some time; would like to discuss it & possibly have labs drawn before discussing referrals.  Appt made with PCP on 03/11/20.

## 2020-03-09 NOTE — Telephone Encounter (Signed)
Discussed with pt that she was supposed to have a follow-up office visit and stool test to see if H pylori had cleared. Offered to schedule appt for pt to see Dr. Silverio Decamp and then pt was not there on the line, child was yelling in the background.

## 2020-03-09 NOTE — Telephone Encounter (Signed)
Please see note below. Pt needs an appt with Dr. Silverio Decamp, please schedule pt.

## 2020-03-11 ENCOUNTER — Encounter: Payer: Self-pay | Admitting: Family

## 2020-03-11 ENCOUNTER — Other Ambulatory Visit (INDEPENDENT_AMBULATORY_CARE_PROVIDER_SITE_OTHER): Payer: Medicare Other

## 2020-03-11 ENCOUNTER — Other Ambulatory Visit: Payer: Self-pay

## 2020-03-11 ENCOUNTER — Ambulatory Visit (INDEPENDENT_AMBULATORY_CARE_PROVIDER_SITE_OTHER): Payer: Medicare Other | Admitting: Family

## 2020-03-11 VITALS — BP 118/82 | HR 92 | Ht 61.0 in | Wt 155.0 lb

## 2020-03-11 DIAGNOSIS — R5383 Other fatigue: Secondary | ICD-10-CM

## 2020-03-11 DIAGNOSIS — T148XXA Other injury of unspecified body region, initial encounter: Secondary | ICD-10-CM | POA: Diagnosis not present

## 2020-03-11 LAB — CBC WITH DIFFERENTIAL/PLATELET
Basophils Absolute: 0.1 10*3/uL (ref 0.0–0.1)
Basophils Relative: 1 % (ref 0.0–3.0)
Eosinophils Absolute: 0.1 10*3/uL (ref 0.0–0.7)
Eosinophils Relative: 0.8 % (ref 0.0–5.0)
HCT: 39.9 % (ref 36.0–46.0)
Hemoglobin: 14 g/dL (ref 12.0–15.0)
Lymphocytes Relative: 27.6 % (ref 12.0–46.0)
Lymphs Abs: 2.3 10*3/uL (ref 0.7–4.0)
MCHC: 35 g/dL (ref 30.0–36.0)
MCV: 89.1 fl (ref 78.0–100.0)
Monocytes Absolute: 0.4 10*3/uL (ref 0.1–1.0)
Monocytes Relative: 5.2 % (ref 3.0–12.0)
Neutro Abs: 5.5 10*3/uL (ref 1.4–7.7)
Neutrophils Relative %: 65.4 % (ref 43.0–77.0)
Platelets: 304 10*3/uL (ref 150.0–400.0)
RBC: 4.48 Mil/uL (ref 3.87–5.11)
RDW: 12.8 % (ref 11.5–15.5)
WBC: 8.4 10*3/uL (ref 4.0–10.5)

## 2020-03-11 LAB — COMPREHENSIVE METABOLIC PANEL
ALT: 17 U/L (ref 0–35)
AST: 17 U/L (ref 0–37)
Albumin: 4.4 g/dL (ref 3.5–5.2)
Alkaline Phosphatase: 53 U/L (ref 39–117)
BUN: 12 mg/dL (ref 6–23)
CO2: 28 mEq/L (ref 19–32)
Calcium: 9.3 mg/dL (ref 8.4–10.5)
Chloride: 102 mEq/L (ref 96–112)
Creatinine, Ser: 0.59 mg/dL (ref 0.40–1.20)
GFR: 109.69 mL/min (ref >60.00)
Glucose, Bld: 85 mg/dL (ref 70–99)
Potassium: 3.6 mEq/L (ref 3.5–5.1)
Sodium: 137 mEq/L (ref 135–145)
Total Bilirubin: 0.5 mg/dL (ref 0.2–1.2)
Total Protein: 7.7 g/dL (ref 6.0–8.3)

## 2020-03-11 MED ORDER — SULFAMETHOXAZOLE-TRIMETHOPRIM 800-160 MG PO TABS: 1.0000 | ORAL_TABLET | Freq: Two times a day (BID) | ORAL | 0 refills | Status: DC

## 2020-03-11 NOTE — Progress Notes (Signed)
Melissa [name] is a 45 y.o. female with the following history as recorded in EpicCare:  Patient Active Problem List   Diagnosis Date Noted  . Nonallopathic lesion of lumbosacral region 03/01/2019  . Nonallopathic lesion of cervical region 03/01/2019  . Nonallopathic lesion of sacral region 03/01/2019  . Nonallopathic lesion of thoracic region 03/01/2019  . Nonallopathic lesion of rib cage 67/28/2021  . Vitamin D deficiency 05/08/2018  . Neck pain 06/17/2017  . Right lateral epicondylitis 04/15/2017  . Piriformis syndrome, left 02/25/2017  . Vaginal discharge 09/11/2016  . Tendinitis of extensor tendon of right hand 06/11/2016  . Mixed stress and urge urinary incontinence 02/07/2016  . Carpal tunnel syndrome 01/03/2016  . Polyarthralgia 01/03/2016  . Galactocele associated with childbirth 11/07/2015  . Pregnancy with poor obstetric history 02/21/2015  . Multigravida of advanced maternal age 67/20/2017  . Radial tunnel syndrome 06/17/2014  . Cervical radiculopathy 06/10/2014  . Bilateral low back pain with right-sided sciatica 06/10/2014  . Bipolar depression ([place]) 05/17/2014  . Chronic night sweats 06/26/2013  . History of recurrent spontaneous abortion, not currently pregnant 01/26/2012  . Fluid retention 01/25/2012  . Obesity (BMI 30.0-34.9) 01/25/2012    Current Outpatient Medications  Medication Sig Dispense Refill  . ammonium lactate (LAC-HYDRIN) 12 % lotion Apply 1 application topically as needed for dry skin. 400 g 5  . amphetamine-dextroamphetamine (ADDERALL) 20 MG tablet TAKE 1 TABLET BY MOUTH IN THE MORNING AND 1 EVERYDAY AT NOON AND 1 TABLET AT 4PM  0  . azelastine (ASTELIN) 0.1 % nasal spray Place 2 sprays into both nostrils 2 (two) times daily. Use in each nostril as directed 30 mL 12  . fluticasone (FLONASE) 50 MCG/ACT nasal spray Use 2 spray(s) in each nostril once daily 16 g 11  . ibuprofen (ADVIL) 800 MG tablet Take 1 tablet by mouth twice daily (Patient taking  differently: as needed.) 180 tablet 0  . levonorgestrel (MIRENA) 20 MCG/24HR IUD 1 each by Intrauterine route once.    . naproxen (NAPROSYN) 500 MG tablet Take 1 tablet (500 mg total) by mouth 2 (two) times daily as needed. 180 tablet 1  . ondansetron (ZOFRAN) 4 MG tablet Take 1 tablet (4 mg total) by mouth every 8 (eight) hours as needed. 20 tablet 0  . Salicylic Acid 3 % SHAM Use daily as directed for dry scalp 236 mL 1  . selenium sulfide (SELSUN) 2.5 % shampoo USE LOTION EXTERNALLY  TO AFFECTED AREA ONCE DAILY AS NEEDED IRRITATION 120 mL 0  . sulfamethoxazole-trimethoprim (BACTRIM DS) 800-160 MG tablet Take 1 tablet by mouth 2 (two) times daily. 10 tablet 0  . Vitamin D, Ergocalciferol, (DRISDOL) 1.25 MG (50000 UNIT) CAPS capsule Take 1 capsule (50,000 Units total) by mouth every 7 (seven) days. 12 capsule 0  . meloxicam (MOBIC) 15 MG tablet Take 15 mg by mouth daily.    Marland Kitchen XIIDRA 5 % SOLN INSTILL 1 DROP TWICE DAILY INTO EACH EYE     No current facility-administered medications for this visit.    Allergies: Patient has no known allergies.  Past Medical History:  Diagnosis Date  . Anxiety   . Arthritis   . Carpal tunnel syndrome   . Depression   . GERD (gastroesophageal reflux disease)   . Hammer toe 04/2013   bilateral 4th and 5th   . Ingrown left big toenail 04/2013  . Mental disorder   . Neuromuscular disorder ([place])   . Post term pregnancy over 40 weeks 09/21/2015  .  SVD (spontaneous vaginal delivery) 09/22/2015  . UTI (lower urinary tract infection)     Past Surgical History:  Procedure Laterality Date  . BREAST LIFT Bilateral   . CORRECTION HAMMER TOE Bilateral   . CRYOABLATION  2004  . CYSTOSCOPY WITH HYDRODISTENSION AND BIOPSY  07/28/2004   with urethral dilatation  . DILATION AND CURETTAGE OF UTERUS    . DILATION AND EVACUATION  12/21/2011   Procedure: DILATATION AND EVACUATION;  Surgeon: Terrance Mass, MD;  Location: [facility];  Service: Gynecology;   Laterality: N/A;  first trimester   . HAMMER TOE SURGERY Bilateral 04/20/2013   Procedure: BILATERAL HAMMER TOE REPAIR AND EXCISION NAIL PERMANENT LEFT FIRST;  Surgeon: Harriet Masson, DPM;  Location: [place];  Service: Podiatry;  Laterality: Bilateral;  Hammer toe repair fourth and fifth toes bilateral feet with screw placement fourth toes and partial nail excision left great toe  . HYSTEROSALPINGOGRAM    . REDUCTION MAMMAPLASTY Bilateral 2020  . TOE SURGERY     ingrown toenail  . WISDOM TOOTH EXTRACTION      Family History  Problem Relation Age of Onset  . Lung cancer Father   . Lung cancer Paternal Grandfather   . Seizures Mother   . Arthritis Mother   . Thyroid disease Maternal Grandmother   . Heart disease Paternal Grandmother        2 CABG    Social History   Tobacco Use  . Smoking status: Never Smoker  . Smokeless tobacco: Never Used  Substance Use Topics  . Alcohol use: Yes    Comment: occasionally    Subjective:   Patient was seen at the end of January with concerns for retained splinter; was treated with antibiotics and noted symptoms had improved; however, earlier this week, she felt like the area of concern "re-flared" and she was able to get more pus out of the wound; she thinks there was a small piece of the splinter retained; would like to re-treat with one more round of antibiotics before going to see a specialist;   Objective:  Vitals:   03/11/20 1600  BP: 118/82  Pulse: 92  SpO2: 98%  Weight: 155 lb (70.3 kg)  Height: _0  (1.549 m)    General: Well developed, well nourished, in no acute distress  Skin : Warm and dry. Small open sore at affected finger- no warmth, redness or streaking noted;  Head: Normocephalic and atraumatic  Lungs: Respirations unlabored;  Musculoskeletal: No deformities; no active joint inflammation  Extremities: No edema, cyanosis, clubbing  Vessels: Symmetric bilaterally  Neurologic: Alert and oriented; speech  intact; face symmetrical; moves all extremities well; CNII-XII intact without focal deficit   Assessment:  1. Splinter in skin   2. Other fatigue     Plan:  Suspect there was a retained splinter from the original injury in January and patient sounds like she was able to get it out this week; she does not want to see specialist at this time but requesting to re-treat with antibiotics; Rx for Bactrim Ds bid x 5 days; warm soaks; Check CBC, CMP per patient request;  Follow up to be determined;  This visit occurred during the SARS-CoV-2 public health emergency.  Safety protocols were in place, including screening questions prior to the visit, additional usage of staff PPE, and extensive cleaning of exam room while observing appropriate contact time as indicated for disinfecting solutions.     No follow-ups on file.  Orders Placed  This Encounter  Procedures  . CBC with Differential/Platelet    Standing Status:   Future    Standing Expiration Date:   03/11/2021  . Comp Met (CMET)    Standing Status:   Future    Standing Expiration Date:   03/11/2021  . CBC w/Diff    Standing Status:   Future    Standing Expiration Date:   03/11/2021  . Comp Met (CMET)    Standing Status:   Future    Standing Expiration Date:   03/11/2021    Requested Prescriptions   Signed Prescriptions Disp Refills  . sulfamethoxazole-trimethoprim (BACTRIM DS) 800-160 MG tablet 10 tablet 0    Sig: Take 1 tablet by mouth 2 (two) times daily.

## 2020-03-11 NOTE — Patient Instructions (Addendum)
You will need to establish with a new PCP at this office as I am transferring to a different location.

## 2020-03-18 ENCOUNTER — Ambulatory Visit: Payer: Medicare Other | Admitting: Family Medicine

## 2020-03-30 NOTE — Progress Notes (Signed)
[facility] [place] Phone: [phone] Subjective:   [name], am serving as a scribe for Dr. Hulan Saas. This visit occurred during the SARS-CoV-2 public health emergency.  Safety protocols were in place, including screening questions prior to the visit, additional usage of staff PPE, and extensive cleaning of exam room while observing appropriate contact time as indicated for disinfecting solutions.   I'm seeing this patient by the request  of:  Marrian Salvage, FNP  CC: wrist pain and back pain   [id number]   12/15/2019 Patient is a 45 year old female who does have what seems to be more of a bilateral carpal tunnel syndrome.  Patient was referred for surgical intervention but missed her appointment.  Has a wait till 8 weeks.  Would like another referral.  Still having pain on a daily basis.  Continues to have numbness.    Update 03/31/2020 KHALIAH BARNICK is a 45 y.o. female coming in with complaint of carpal tunnel syndrome. Had surgery in both wrists Dec and February. Radiating pain to R thumb. Patient feels that neck pain is coming back and radiating into both arms. Patient also has chronic lumbar spine pain. Denies any numbness in arms/wrist.   Also states that she had bilateral calf tightness last week that lasted 7 days. States that it was hard to walk due to pain.        Past Medical History:  Diagnosis Date  . Anxiety   . Arthritis   . Carpal tunnel syndrome   . Depression   . GERD (gastroesophageal reflux disease)   . Hammer toe 04/2013   bilateral 4th and 5th   . Ingrown left big toenail 04/2013  . Mental disorder   . Neuromuscular disorder ([place])   . Post term pregnancy over 40 weeks 09/21/2015  . SVD (spontaneous vaginal delivery) 09/22/2015  . UTI (lower urinary tract infection)    Past Surgical History:  Procedure Laterality Date  . BREAST LIFT Bilateral   . CORRECTION  HAMMER TOE Bilateral   . CRYOABLATION  2004  . CYSTOSCOPY WITH HYDRODISTENSION AND BIOPSY  07/28/2004   with urethral dilatation  . DILATION AND CURETTAGE OF UTERUS    . DILATION AND EVACUATION  12/21/2011   Procedure: DILATATION AND EVACUATION;  Surgeon: Terrance Mass, MD;  Location: [facility];  Service: Gynecology;  Laterality: N/A;  first trimester   . HAMMER TOE SURGERY Bilateral 04/20/2013   Procedure: BILATERAL HAMMER TOE REPAIR AND EXCISION NAIL PERMANENT LEFT FIRST;  Surgeon: Harriet Masson, DPM;  Location: [place];  Service: Podiatry;  Laterality: Bilateral;  Hammer toe repair fourth and fifth toes bilateral feet with screw placement fourth toes and partial nail excision left great toe  . HYSTEROSALPINGOGRAM    . REDUCTION MAMMAPLASTY Bilateral 2020  . TOE SURGERY     ingrown toenail  . WISDOM TOOTH EXTRACTION     Social History   Socioeconomic History  . Marital status: Single    Spouse name: Not on file  . Number of children: 1  . Years of education: Not on file  . Highest education level: Not on file  Occupational History  . Occupation: Homemaker  Tobacco Use  . Smoking status: Never Smoker  . Smokeless tobacco: Never Used  Vaping Use  . Vaping Use: Never used  Substance and Sexual Activity  . Alcohol use: Yes    Comment: occasionally  . Drug use:  No  . Sexual activity: Yes    Birth control/protection: I.U.D.  Other Topics Concern  . Not on file  Social History Narrative  . Not on file   Social Determinants of Health   Financial Resource Strain: Not on file  Food Insecurity: Not on file  Transportation Needs: Not on file  Physical Activity: Not on file  Stress: Not on file  Social Connections: Not on file   No Known Allergies Family History  Problem Relation Age of Onset  . Lung cancer Father   . Lung cancer Paternal Grandfather   . Seizures Mother   . Arthritis Mother   . Thyroid disease Maternal Grandmother    . Heart disease Paternal Grandmother        2 CABG    Current Outpatient Medications (Endocrine & Metabolic):  .  levonorgestrel (MIRENA) 20 MCG/24HR IUD, 1 each by Intrauterine route once.   Current Outpatient Medications (Respiratory):  .  azelastine (ASTELIN) 0.1 % nasal spray, Place 2 sprays into both nostrils 2 (two) times daily. Use in each nostril as directed .  fluticasone (FLONASE) 50 MCG/ACT nasal spray, Use 2 spray(s) in each nostril once daily  Current Outpatient Medications (Analgesics):  .  ibuprofen (ADVIL) 800 MG tablet, Take 1 tablet by mouth twice daily (Patient taking differently: as needed.) .  meloxicam (MOBIC) 15 MG tablet, Take 15 mg by mouth daily. .  naproxen (NAPROSYN) 500 MG tablet, Take 1 tablet (500 mg total) by mouth 2 (two) times daily as needed.   Current Outpatient Medications (Other):  .  ammonium lactate (LAC-HYDRIN) 12 % lotion, Apply 1 application topically as needed for dry skin. Marland Kitchen  amphetamine-dextroamphetamine (ADDERALL) 20 MG tablet, TAKE 1 TABLET BY MOUTH IN THE MORNING AND 1 EVERYDAY AT NOON AND 1 TABLET AT 4PM .  ondansetron (ZOFRAN) 4 MG tablet, Take 1 tablet (4 mg total) by mouth every 8 (eight) hours as needed. .  Salicylic Acid 3 % SHAM, Use daily as directed for dry scalp .  selenium sulfide (SELSUN) 2.5 % shampoo, USE LOTION EXTERNALLY  TO AFFECTED AREA ONCE DAILY AS NEEDED IRRITATION .  sulfamethoxazole-trimethoprim (BACTRIM DS) 800-160 MG tablet, Take 1 tablet by mouth 2 (two) times daily. .  Vitamin D, Ergocalciferol, (DRISDOL) 1.25 MG (50000 UNIT) CAPS capsule, Take 1 capsule (50,000 Units total) by mouth every 7 (seven) days. Marland Kitchen  XIIDRA 5 % SOLN, INSTILL 1 DROP TWICE DAILY INTO EACH EYE   Reviewed prior external information including notes and imaging from  primary care provider As well as notes that were available from care everywhere and other healthcare systems.  Past medical history, social, surgical and family history all  reviewed in electronic medical record.  No pertanent information unless stated regarding to the chief complaint.   Review of Systems:  No headache, visual changes, nausea, vomiting, diarrhea, constipation, dizziness, abdominal pain, skin rash, fevers, chills, night sweats, weight loss, swollen lymph nodes, joint swelling, chest pain, shortness of breath, mood changes. POSITIVE muscle aches, body aches   Objective  Blood pressure 120/88, pulse (!) 108, height 5\' 1"  (1.549 m), SpO2 97 %.   General: No apparent distress alert and oriented x3 mood and affect normal, dressed appropriately.  HEENT: Pupils equal, extraocular movements intact  Respiratory: Patient's speak in full sentences and does not appear short of breath  Cardiovascular: No lower extremity edema, non tender, no erythema  Gait normal with good balance and coordination.  MSK: back exam shows mild loss of lordosis  positive FABER, negative SLT. TTP diffusely in the soft tissue mostly TL area   Right wrist positive finklestein  Osteopathic findings C5 flexed rotated and side bent right T3 extended rotated and side bent right inhaled third rib T11 extended rotated and side bent left L2 flexed rotated and side bent right Sacrum right on right  Procedure: Real-time Ultrasound Guided Injection of abductor pollicis longus tendon sheath Device: GE Logiq Q7 Ultrasound guided injection is preferred based studies that show increased duration, increased effect, greater accuracy, decreased procedural pain, increased response rate, and decreased cost with ultrasound guided versus blind injection.  Verbal informed consent obtained.  Time-out conducted.  Noted no overlying erythema, induration, or other signs of local infection.  Skin prepped in a sterile fashion.  Local anesthesia: Topical Ethyl chloride.  With sterile technique and under real time ultrasound guidance: With a 25-gauge half inch needle injected with 0.5 cc of 0.5% Marcaine  and 0.5 cc of Kenalog 40 mg/mL Completed without difficulty  Pain immediately resolved suggesting accurate placement of the medication.  Advised to call if fevers/chills, erythema, induration, drainage, or persistent bleeding.  Impression: Technically successful ultrasound guided injection.    Impression and Recommendations:     The above documentation has been reviewed and is accurate and complete Lyndal Pulley, DO

## 2020-03-31 ENCOUNTER — Ambulatory Visit (INDEPENDENT_AMBULATORY_CARE_PROVIDER_SITE_OTHER): Payer: Medicare Other | Admitting: Family Medicine

## 2020-03-31 ENCOUNTER — Other Ambulatory Visit: Payer: Self-pay

## 2020-03-31 ENCOUNTER — Ambulatory Visit (INDEPENDENT_AMBULATORY_CARE_PROVIDER_SITE_OTHER): Payer: Medicare Other

## 2020-03-31 ENCOUNTER — Ambulatory Visit: Payer: Medicare Other | Admitting: Family Medicine

## 2020-03-31 VITALS — BP 120/88 | HR 108 | Ht 61.0 in

## 2020-03-31 DIAGNOSIS — M654 Radial styloid tenosynovitis [de Quervain]: Secondary | ICD-10-CM | POA: Insufficient documentation

## 2020-03-31 DIAGNOSIS — M999 Biomechanical lesion, unspecified: Secondary | ICD-10-CM | POA: Diagnosis not present

## 2020-03-31 DIAGNOSIS — M545 Low back pain, unspecified: Secondary | ICD-10-CM

## 2020-03-31 DIAGNOSIS — M5459 Other low back pain: Secondary | ICD-10-CM

## 2020-03-31 DIAGNOSIS — G8929 Other chronic pain: Secondary | ICD-10-CM | POA: Diagnosis not present

## 2020-03-31 DIAGNOSIS — M5442 Lumbago with sciatica, left side: Secondary | ICD-10-CM | POA: Diagnosis not present

## 2020-03-31 DIAGNOSIS — G5601 Carpal tunnel syndrome, right upper limb: Secondary | ICD-10-CM | POA: Diagnosis not present

## 2020-03-31 DIAGNOSIS — M5441 Lumbago with sciatica, right side: Secondary | ICD-10-CM | POA: Diagnosis not present

## 2020-03-31 NOTE — Assessment & Plan Note (Signed)
Chronic problem, with exacerbation  Respond to OMT and hopefully will continue, Will get new xrays, with worsening radicular symptoms would consider MRI but hope it is not necessary

## 2020-03-31 NOTE — Assessment & Plan Note (Signed)
Injected today,, I do think the CTS is better, discussed HEP, discussed bracing  rtc in 6 weeks

## 2020-03-31 NOTE — Patient Instructions (Addendum)
Good to see you Tried injection in the tendon Exercise 3 times a week Back xray today  Tried manipulation See me again in 6 weeks

## 2020-04-01 ENCOUNTER — Encounter: Payer: Self-pay | Admitting: Family Medicine

## 2020-04-06 ENCOUNTER — Telehealth: Payer: Self-pay | Admitting: Family Medicine

## 2020-04-08 NOTE — Telephone Encounter (Signed)
Patient called stating that she is needing this medication refilled.

## 2020-04-11 ENCOUNTER — Telehealth: Payer: Self-pay | Admitting: Family Medicine

## 2020-04-11 NOTE — Telephone Encounter (Signed)
No to pain meds,  Can get compression socks  Get heel lift in the shoes,  Ice after activity If worsening pain or swelling, she does need to go to ER or urgent care, especially if any shortness of breath as well

## 2020-04-11 NOTE — Telephone Encounter (Signed)
Pt called about her calf pain, said it was discussed during last visit. Has appt with [place] 5/19 and unable to come in much earlier.  Pt remembers her Uncle suffered from something similar that was caused by "liquid" in his body.  Both calves hurt, unable to workout due to the pain. Would like something called in for pain.

## 2020-04-13 ENCOUNTER — Other Ambulatory Visit: Payer: Self-pay

## 2020-04-13 DIAGNOSIS — M542 Cervicalgia: Secondary | ICD-10-CM

## 2020-04-13 NOTE — Telephone Encounter (Signed)
Patient would like to know if she needs xray and MRI of neck as she is still having symptoms

## 2020-04-13 NOTE — Telephone Encounter (Signed)
Next step is xray and MRI We can order it, she can get xray closer to where she lives if she wants

## 2020-04-13 NOTE — Telephone Encounter (Signed)
Talked to patient. She is still having neck pain and wants to know next steps. Would like a look at calf at next visit.

## 2020-04-13 NOTE — Telephone Encounter (Signed)
Left VM to call back 

## 2020-04-26 ENCOUNTER — Other Ambulatory Visit: Payer: Medicare Other

## 2020-04-27 ENCOUNTER — Ambulatory Visit
Admission: RE | Admit: 2020-04-27 | Discharge: 2020-04-27 | Disposition: A | Discharge status: Home / Self Care | Payer: Medicare Other | Source: Ambulatory Visit | Attending: Surgery | Admitting: Surgery

## 2020-04-27 ENCOUNTER — Other Ambulatory Visit: Payer: Self-pay

## 2020-04-27 DIAGNOSIS — L811 Chloasma: Secondary | ICD-10-CM | POA: Diagnosis not present

## 2020-04-27 DIAGNOSIS — L218 Other seborrheic dermatitis: Secondary | ICD-10-CM | POA: Diagnosis not present

## 2020-04-27 DIAGNOSIS — N644 Mastodynia: Secondary | ICD-10-CM

## 2020-05-02 ENCOUNTER — Telehealth: Payer: Self-pay | Admitting: Family

## 2020-05-02 DIAGNOSIS — H938X9 Other specified disorders of ear, unspecified ear: Secondary | ICD-10-CM

## 2020-05-02 NOTE — Telephone Encounter (Signed)
Yes, we will put in the referral for her.

## 2020-05-02 NOTE — Telephone Encounter (Signed)
Noted. Referral already in

## 2020-05-02 NOTE — Telephone Encounter (Signed)
    Patient requesting new referral to ENT; clogged ears Patient states she never saw ENT last year (referral made in 2021) Declined appointment

## 2020-05-02 NOTE — Telephone Encounter (Signed)
Pt has an appointment coming up with [facility] on 06/30/20 and was last seen for a splinter in skin on 03/11/20.  Please advise if we are able to send in another referral for ENT?

## 2020-05-03 ENCOUNTER — Telehealth: Payer: Self-pay

## 2020-05-04 ENCOUNTER — Other Ambulatory Visit: Payer: Self-pay | Admitting: Family Medicine

## 2020-05-05 DIAGNOSIS — G5601 Carpal tunnel syndrome, right upper limb: Secondary | ICD-10-CM | POA: Diagnosis not present

## 2020-05-05 DIAGNOSIS — M1811 Unilateral primary osteoarthritis of first carpometacarpal joint, right hand: Secondary | ICD-10-CM | POA: Diagnosis not present

## 2020-05-05 DIAGNOSIS — G5602 Carpal tunnel syndrome, left upper limb: Secondary | ICD-10-CM | POA: Diagnosis not present

## 2020-05-07 ENCOUNTER — Other Ambulatory Visit: Payer: Self-pay

## 2020-05-07 ENCOUNTER — Ambulatory Visit
Admission: RE | Admit: 2020-05-07 | Discharge: 2020-05-07 | Disposition: A | Discharge status: Home / Self Care | Payer: Medicare Other | Source: Ambulatory Visit | Attending: Family Medicine | Admitting: Family Medicine

## 2020-05-07 DIAGNOSIS — M50222 Other cervical disc displacement at C5-C6 level: Secondary | ICD-10-CM | POA: Diagnosis not present

## 2020-05-07 DIAGNOSIS — R531 Weakness: Secondary | ICD-10-CM | POA: Diagnosis not present

## 2020-05-07 DIAGNOSIS — M542 Cervicalgia: Secondary | ICD-10-CM

## 2020-05-07 DIAGNOSIS — G8929 Other chronic pain: Secondary | ICD-10-CM | POA: Diagnosis not present

## 2020-05-07 DIAGNOSIS — R200 Anesthesia of skin: Secondary | ICD-10-CM | POA: Diagnosis not present

## 2020-05-18 NOTE — Progress Notes (Signed)
[place] Phone: [phone] Subjective:   [name], am serving as a scribe for Dr. Hulan Saas. This visit occurred during the SARS-CoV-2 public health emergency.  Safety protocols were in place, including screening questions prior to the visit, additional usage of staff PPE, and extensive cleaning of exam room while observing appropriate contact time as indicated for disinfecting solutions.   I'm seeing this patient by the request  of:  Marrian Salvage, FNP  CC: Back and neck pain follow-up  [id number]  Melissa [name] is a 45 y.o. female coming in with complaint of back and neck pain. OMT 03/31/2020 as well as B carpal tunnel f/u. Continues to have neck pain daily.  Patient did have MRI done and it is stated below.  This was independently visualized by me today.    Medications patient has been prescribed: Naproxen  Taking:  MRI cervical 05/07/2020 IMPRESSION: Essentially normal MRI of the cervical spine. Minimal non-compressive disc bulges C5-6 and C6-7. No change since 2019         Past Medical History:  Diagnosis Date  . Anxiety   . Arthritis   . Carpal tunnel syndrome   . Depression   . GERD (gastroesophageal reflux disease)   . Hammer toe 04/2013   bilateral 4th and 5th   . Ingrown left big toenail 04/2013  . Mental disorder   . Neuromuscular disorder ([place])   . Post term pregnancy over 40 weeks 09/21/2015  . SVD (spontaneous vaginal delivery) 09/22/2015  . UTI (lower urinary tract infection)     No Known Allergies   Review of Systems:  No , visual changes, nausea, vomiting, diarrhea, constipation, dizziness, abdominal pain, skin rash, fevers, chills, night sweats, weight loss, swollen lymph nodes, joint swelling, chest pain, shortness of breath, mood changes. POSITIVE muscle aches, body aches, headache  Objective  Blood pressure 110/76, pulse 86, height 5\' 1"  (1.549 m),  weight 148 lb (67.1 kg), SpO2 99 %.   General: No apparent distress alert and oriented x3 mood and affect normal, dressed appropriately.  HEENT: Pupils equal, extraocular movements intact  Respiratory: Patient's speak in full sentences and does not appear short of breath  Cardiovascular: No lower extremity edema, non tender, no erythema  Gait normal with good balance and coordination.  MSK: Patient does have diffuse tenderness of multiple different areas mostly of the soft tissue.  Seems to have tightness noted in the left side of the trapezius.  Patient also has tenderness noted in the parascapular region left greater than right. Tightness of the low back in the lumbar area.  Mild pain over the right sacroiliac joint.  Mild tightness on Corky Sox on the right compared to left. Neck exam does have good range of motion.  Negative Spurling's today.  Still though has radicular symptoms with Tinel's moderately.  Good grip strength are noted.  Osteopathic findings C4 flexed rotated and side bent right C7 flexed rotated and side bent left T5 extended rotated and side bent left with inhaled rib L5 flexed rotated and side bent left Sacrum right on right       Assessment and Plan:  Polyarthralgia Patient's MRI of her cervical spine seems to be fairly unremarkable.  Discussed with patient at great length about him potentially still considering the possibility of epidural but I do not think it is necessary.  At this point I think we need to treat this is more of a  polyarthralgia and consider the possibility of Effexor again.  Patient was on in 2018 and was making some progress.  He did go and titrated all the way up to 250 mg at 1 point.  Discussed with patient about icing regimen and home exercises, discussed avoiding certain activities.  Follow-up with me again 5 to 6 weeks.  Patient is responding well to the manipulation therapy as well.    Nonallopathic problems  Decision today to treat with OMT  was based on Physical Exam  After verbal consent patient was treated with HVLA, ME, FPR techniques in cervical, rib, thoracic, lumbar, and sacral  areas  Patient tolerated the procedure well with improvement in symptoms  Patient given exercises, stretches and lifestyle modifications  See medications in patient instructions if given  Patient will follow up in 4-8 weeks     The above documentation has been reviewed and is accurate and complete Lyndal Pulley, DO       Note: This dictation was prepared with Dragon dictation along with smaller phrase technology. Any transcriptional errors that result from this process are unintentional.

## 2020-05-19 ENCOUNTER — Encounter: Payer: Self-pay | Admitting: Family Medicine

## 2020-05-19 ENCOUNTER — Ambulatory Visit: Payer: Medicare Other | Admitting: Family Medicine

## 2020-05-19 ENCOUNTER — Other Ambulatory Visit: Payer: Self-pay

## 2020-05-19 VITALS — BP 110/76 | HR 86 | Ht 61.0 in | Wt 148.0 lb

## 2020-05-19 DIAGNOSIS — M9908 Segmental and somatic dysfunction of rib cage: Secondary | ICD-10-CM | POA: Diagnosis not present

## 2020-05-19 DIAGNOSIS — M2550 Pain in unspecified joint: Secondary | ICD-10-CM

## 2020-05-19 DIAGNOSIS — M9902 Segmental and somatic dysfunction of thoracic region: Secondary | ICD-10-CM | POA: Diagnosis not present

## 2020-05-19 DIAGNOSIS — M9904 Segmental and somatic dysfunction of sacral region: Secondary | ICD-10-CM

## 2020-05-19 DIAGNOSIS — M9901 Segmental and somatic dysfunction of cervical region: Secondary | ICD-10-CM | POA: Diagnosis not present

## 2020-05-19 DIAGNOSIS — M9903 Segmental and somatic dysfunction of lumbar region: Secondary | ICD-10-CM | POA: Diagnosis not present

## 2020-05-19 MED ORDER — VENLAFAXINE HCL ER 37.5 MG PO CP24: 37.5000 mg | ORAL_CAPSULE | Freq: Every day | ORAL | 0 refills | Status: DC

## 2020-05-19 NOTE — Patient Instructions (Addendum)
Effexor 37.5mg  daily Continue exercises See me in 5-6 weeks

## 2020-05-19 NOTE — Assessment & Plan Note (Signed)
Patient's MRI of her cervical spine seems to be fairly unremarkable.  Discussed with patient at great length about him potentially still considering the possibility of epidural but I do not think it is necessary.  At this point I think we need to treat this is more of a polyarthralgia and consider the possibility of Effexor again.  Patient was on in 2018 and was making some progress.  He did go and titrated all the way up to 250 mg at 1 point.  Discussed with patient about icing regimen and home exercises, discussed avoiding certain activities.  Follow-up with me again 5 to 6 weeks.  Patient is responding well to the manipulation therapy as well.

## 2020-06-07 ENCOUNTER — Ambulatory Visit (INDEPENDENT_AMBULATORY_CARE_PROVIDER_SITE_OTHER): Payer: Medicare Other | Admitting: Otolaryngology

## 2020-06-16 ENCOUNTER — Other Ambulatory Visit: Payer: Self-pay | Admitting: Family Medicine

## 2020-06-23 ENCOUNTER — Ambulatory Visit: Payer: Medicare Other | Admitting: Family Medicine

## 2020-06-23 NOTE — Progress Notes (Deleted)
[place] Phone: [phone] Subjective:    I'm seeing this patient by the request  of:  Marrian Salvage, FNP  CC: back pain follow up all over pain   [id number]  AKAYSHA COBERN is a 45 y.o. female coming in with complaint of back pain. OMT 05/19/2020. Patient states   Onset-  Location Duration-  Character- Aggravating factors- Reliving factors-  Therapies tried-  Severity-   On effexor   Past Medical History:  Diagnosis Date   Anxiety    Arthritis    Carpal tunnel syndrome    Depression    GERD (gastroesophageal reflux disease)    Hammer toe 04/2013   bilateral 4th and 5th    Ingrown left big toenail 04/2013   Mental disorder    Neuromuscular disorder ([place])    Post term pregnancy over 40 weeks 09/21/2015   SVD (spontaneous vaginal delivery) 09/22/2015   UTI (lower urinary tract infection)    Past Surgical History:  Procedure Laterality Date   BREAST LIFT Bilateral    CORRECTION HAMMER TOE Bilateral    CRYOABLATION  2004   CYSTOSCOPY WITH HYDRODISTENSION AND BIOPSY  07/28/2004   with urethral dilatation   DILATION AND CURETTAGE OF UTERUS     DILATION AND EVACUATION  12/21/2011   Procedure: DILATATION AND EVACUATION;  Surgeon: Terrance Mass, MD;  Location: [facility];  Service: Gynecology;  Laterality: N/A;  first trimester    HAMMER TOE SURGERY Bilateral 04/20/2013   Procedure: BILATERAL HAMMER TOE REPAIR AND EXCISION NAIL PERMANENT LEFT FIRST;  Surgeon: Harriet Masson, DPM;  Location: [facility];  Service: Podiatry;  Laterality: Bilateral;  Hammer toe repair fourth and fifth toes bilateral feet with screw placement fourth toes and partial nail excision left great toe   HYSTEROSALPINGOGRAM     REDUCTION MAMMAPLASTY Bilateral 2020   TOE SURGERY     ingrown toenail   WISDOM TOOTH EXTRACTION     Social History   Socioeconomic History   Marital  status: Single    Spouse name: Not on file   Number of children: 1   Years of education: Not on file   Highest education level: Not on file  Occupational History   Occupation: Homemaker  Tobacco Use   Smoking status: Never   Smokeless tobacco: Never  Vaping Use   Vaping Use: Never used  Substance and Sexual Activity   Alcohol use: Yes    Comment: occasionally   Drug use: No   Sexual activity: Yes    Birth control/protection: I.U.D.  Other Topics Concern   Not on file  Social History Narrative   Not on file   Social Determinants of Health   Financial Resource Strain: Not on file  Food Insecurity: Not on file  Transportation Needs: Not on file  Physical Activity: Not on file  Stress: Not on file  Social Connections: Not on file   No Known Allergies Family History  Problem Relation Age of Onset   Lung cancer Father    Lung cancer Paternal Grandfather    Seizures Mother    Arthritis Mother    Thyroid disease Maternal Grandmother    Heart disease Paternal Grandmother        2 CABG    Current Outpatient Medications (Endocrine & Metabolic):    levonorgestrel (MIRENA) 20 MCG/24HR IUD, 1 each by Intrauterine route once.   Current Outpatient Medications (Respiratory):  azelastine (ASTELIN) 0.1 % nasal spray, Place 2 sprays into both nostrils 2 (two) times daily. Use in each nostril as directed   fluticasone (FLONASE) 50 MCG/ACT nasal spray, Use 2 spray(s) in each nostril once daily  Current Outpatient Medications (Analgesics):    ibuprofen (ADVIL) 800 MG tablet, Take 1 tablet by mouth twice daily (Patient taking differently: as needed.)   naproxen (NAPROSYN) 500 MG tablet, Take 1 tablet (500 mg total) by mouth 2 (two) times daily as needed.   Current Outpatient Medications (Other):    ammonium lactate (LAC-HYDRIN) 12 % lotion, Apply 1 application topically as needed for dry skin.   amphetamine-dextroamphetamine (ADDERALL) 20 MG tablet, TAKE 1 TABLET BY MOUTH IN THE  MORNING AND 1 EVERYDAY AT NOON AND 1 TABLET AT 4PM   ondansetron (ZOFRAN) 4 MG tablet, Take 1 tablet (4 mg total) by mouth every 8 (eight) hours as needed.   Salicylic Acid 3 % SHAM, Use daily as directed for dry scalp   selenium sulfide (SELSUN) 2.5 % shampoo, USE LOTION EXTERNALLY  TO AFFECTED AREA ONCE DAILY AS NEEDED IRRITATION   sulfamethoxazole-trimethoprim (BACTRIM DS) 800-160 MG tablet, Take 1 tablet by mouth 2 (two) times daily.   venlafaxine XR (EFFEXOR-XR) 37.5 MG 24 hr capsule, TAKE 1 CAPSULE BY MOUTH DAILY WITH BREAKFAST.   Vitamin D, Ergocalciferol, (DRISDOL) 1.25 MG (50000 UNIT) CAPS capsule, Take 1 capsule (50,000 Units total) by mouth every 7 (seven) days.   XIIDRA 5 % SOLN, INSTILL 1 DROP TWICE DAILY INTO EACH EYE   Reviewed prior external information including notes and imaging from  primary care provider As well as notes that were available from care everywhere and other healthcare systems.  Past medical history, social, surgical and family history all reviewed in electronic medical record.  No pertanent information unless stated regarding to the chief complaint.   Review of Systems:  No headache, visual changes, nausea, vomiting, diarrhea, constipation, dizziness, abdominal pain, skin rash, fevers, chills, night sweats, weight loss, swollen lymph nodes, body aches, joint swelling, chest pain, shortness of breath, mood changes. POSITIVE muscle aches  Objective  There were no vitals taken for this visit.   General: No apparent distress alert and oriented x3 mood and affect normal, dressed appropriately.  HEENT: Pupils equal, extraocular movements intact  Respiratory: Patient's speak in full sentences and does not appear short of breath  Cardiovascular: No lower extremity edema, non tender, no erythema  Gait normal with good balance and coordination.  MSK:  Non tender with full range of motion and good stability and symmetric strength and tone of shoulders, elbows, wrist,  hip, knee and ankles bilaterally.  Back exam     Impression and Recommendations:     The above documentation has been reviewed and is accurate and complete Lyndal Pulley, DO

## 2020-06-28 NOTE — Telephone Encounter (Signed)
error 

## 2020-06-30 ENCOUNTER — Encounter: Payer: Medicare Other | Admitting: Internal Medicine

## 2020-07-21 ENCOUNTER — Other Ambulatory Visit: Payer: Self-pay | Admitting: Gastroenterology

## 2020-07-26 NOTE — Progress Notes (Signed)
[place] Phone: [phone] Subjective:   Melissa [name], am serving as a scribe for Dr. Hulan Saas.  This visit occurred during the SARS-CoV-2 public health emergency.  Safety protocols were in place, including screening questions prior to the visit, additional usage of staff PPE, and extensive cleaning of exam room while observing appropriate contact time as indicated for disinfecting solutions.   I'm seeing this patient by the request  of:  Marrian Salvage, FNP  CC: Back pain follow-up  [id number]  Melissa [name] is a 45 y.o. female coming in with complaint of back and neck pain. OMT 05/19/2020. Patient states that she is having a more lower back pain recently.   Noticed pain in cervical spine after drinking beer. Did not happen with wine. Patient states that she is not a big drinker.   Medications patient has been prescribed: Effexor         Past Medical History:  Diagnosis Date   Anxiety    Arthritis    Carpal tunnel syndrome    Depression    GERD (gastroesophageal reflux disease)    Hammer toe 04/2013   bilateral 4th and 5th    Ingrown left big toenail 04/2013   Mental disorder    Neuromuscular disorder ([place])    Post term pregnancy over 40 weeks 09/21/2015   SVD (spontaneous vaginal delivery) 09/22/2015   UTI (lower urinary tract infection)     [name] Known Allergies   Review of Systems:  [name] headache, visual changes, nausea, vomiting, diarrhea, constipation, dizziness, abdominal pain, skin rash, fevers, chills, night sweats, weight loss, swollen lymph nodes,  joint swelling, chest pain, shortness of breath, mood changes. POSITIVE muscle aches, body aches  Objective  Blood pressure 124/84, pulse 99, height '5\' 1"'$  (1.549 m), weight 147 lb (66.7 kg), SpO2 92 %.   General: [name] apparent distress alert and oriented x3 mood and affect normal, dressed appropriately.  HEENT: Pupils equal,  extraocular movements intact  Respiratory: Patient's speak in full sentences and does not appear short of breath  Cardiovascular: [name] lower extremity edema, non tender, [name] erythema  Patient is in pain all over.  Patient does have tenderness in the parascapular region as well as the paraspinal musculature of the cervical spine.  [name] radicular symptoms noted.  Patient's pain now is out of proportion to the amount of palpation.  Osteopathic findings  C2 flexed rotated and side bent right C6 flexed rotated and side bent left T3 extended rotated and side bent right inhaled rib T9 extended rotated and side bent left L2 flexed rotated and side bent right Sacrum right on right     Assessment and Plan:  Bilateral low back pain with right-sided sciatica Chronic pain in the low back and neck.  Seems to be still more muscular in potentially chronic fatigue versus fibromyalgia with work-up again.  Unremarkable.  Responded well to the Effexor previously.  Hopefully patient will continue to do so.  We discussed different medications as well.  Has been on naproxen previously as well.  Increase activity slowly.  Follow-up again in 6 to 8 weeks   Nonallopathic problems  Decision today to treat with OMT was based on Physical Exam  After verbal consent patient was treated with HVLA, ME, FPR techniques in cervical, rib, thoracic, lumbar, and sacral  areas  Patient tolerated the procedure well with improvement in symptoms  Patient given exercises, stretches and lifestyle modifications  See medications in patient instructions if given  Patient will follow up in 4-8 weeks     The above documentation has been reviewed and is accurate and complete Lyndal Pulley, DO        Note: This dictation was prepared with Dragon dictation along with smaller phrase technology. Any transcriptional errors that result from this process are unintentional.

## 2020-07-27 ENCOUNTER — Ambulatory Visit: Payer: Medicare Other | Admitting: Family Medicine

## 2020-07-27 ENCOUNTER — Other Ambulatory Visit: Payer: Self-pay

## 2020-07-27 DIAGNOSIS — G8929 Other chronic pain: Secondary | ICD-10-CM | POA: Diagnosis not present

## 2020-07-27 DIAGNOSIS — M2550 Pain in unspecified joint: Secondary | ICD-10-CM

## 2020-07-27 DIAGNOSIS — M5441 Lumbago with sciatica, right side: Secondary | ICD-10-CM

## 2020-07-27 MED ORDER — VENLAFAXINE HCL ER 37.5 MG PO CP24: 37.5000 mg | ORAL_CAPSULE | Freq: Every day | ORAL | 0 refills | Status: DC

## 2020-07-27 NOTE — Assessment & Plan Note (Signed)
Continues to have polyarthralgia.  Work-up has been fairly unremarkable over the course of the years.  Patient is going to increase activity where tolerated.  Patient has been on multiple different antiinflammatories.  Responding somewhat to the Effexor.

## 2020-07-27 NOTE — Patient Instructions (Signed)
See me again in 6 weeks 

## 2020-07-27 NOTE — Assessment & Plan Note (Signed)
Chronic pain in the low back and neck.  Seems to be still more muscular in potentially chronic fatigue versus fibromyalgia with work-up again.  Unremarkable.  Responded well to the Effexor previously.  Hopefully patient will continue to do so.  We discussed different medications as well.  Has been on naproxen previously as well.  Increase activity slowly.  Follow-up again in 6 to 8 weeks

## 2020-08-29 ENCOUNTER — Ambulatory Visit (INDEPENDENT_AMBULATORY_CARE_PROVIDER_SITE_OTHER): Payer: Medicare Other

## 2020-08-29 DIAGNOSIS — Z0000 Encounter for general adult medical examination without abnormal findings: Secondary | ICD-10-CM | POA: Diagnosis not present

## 2020-08-29 NOTE — Patient Instructions (Signed)
Melissa [name] , Thank you for taking time to come for your Medicare Wellness Visit. I appreciate your ongoing commitment to your health goals. Please review the following plan we discussed and let me know if I can assist you in the future.   Screening recommendations/referrals: Colonoscopy: never done Mammogram: 04/27/2020 Bone Density: never done Recommended yearly ophthalmology/optometry visit for glaucoma screening and checkup Recommended yearly dental visit for hygiene and checkup  Vaccinations: Influenza vaccine: declined Pneumococcal vaccine: never done Tdap vaccine: 09/23/2015; due every 10 years Shingles vaccine: never done  Covid-19: 04/06/2019, 04/27/2019  Advanced directives: Please bring a copy of your health care power of attorney and living will to the office at your convenience.  Conditions/risks identified: Yes; Client understands the importance of follow-up with providers by attending scheduled visits and discussed goals to eat healthier, increase physical activity, exercise the brain, socialize more, get enough sleep and make time for laughter.  Next appointment: Please schedule your next Medicare Wellness Visit with your Nurse Health Advisor in 1 year by calling [phone].  Preventive Care 40-64 Years, Female Preventive care refers to lifestyle choices and visits with your health care provider that can promote health and wellness. What does preventive care include? A yearly physical exam. This is also called an annual well check. Dental exams once or twice a year. Routine eye exams. Ask your health care provider how often you should have your eyes checked. Personal lifestyle choices, including: Daily care of your teeth and gums. Regular physical activity. Eating a healthy diet. Avoiding tobacco and drug use. Limiting alcohol use. Practicing safe sex. Taking low-dose aspirin daily starting at age 40. Taking vitamin and mineral supplements as recommended by your  health care provider. What happens during an annual well check? The services and screenings done by your health care provider during your annual well check will depend on your age, overall health, lifestyle risk factors, and family history of disease. Counseling  Your health care provider may ask you questions about your: Alcohol use. Tobacco use. Drug use. Emotional well-being. Home and relationship well-being. Sexual activity. Eating habits. Work and work Statistician. Method of birth control. Menstrual cycle. Pregnancy history. Screening  You may have the following tests or measurements: Height, weight, and BMI. Blood pressure. Lipid and cholesterol levels. These may be checked every 5 years, or more frequently if you are over 77 years old. Skin check. Lung cancer screening. You may have this screening every year starting at age 66 if you have a 30-pack-year history of smoking and currently smoke or have quit within the past 15 years. Fecal occult blood test (FOBT) of the stool. You may have this test every year starting at age 52. Flexible sigmoidoscopy or colonoscopy. You may have a sigmoidoscopy every 5 years or a colonoscopy every 10 years starting at age 30. Hepatitis C blood test. Hepatitis B blood test. Sexually transmitted disease (STD) testing. Diabetes screening. This is done by checking your blood sugar (glucose) after you have not eaten for a while (fasting). You may have this done every 1-3 years. Mammogram. This may be done every 1-2 years. Talk to your health care provider about when you should start having regular mammograms. This may depend on whether you have a family history of breast cancer. BRCA-related cancer screening. This may be done if you have a family history of breast, ovarian, tubal, or peritoneal cancers. Pelvic exam and Pap test. This may be done every 3 years starting at age 5. Starting at age 10,  this may be done every 5 years if you have a Pap test  in combination with an HPV test. Bone density scan. This is done to screen for osteoporosis. You may have this scan if you are at high risk for osteoporosis. Discuss your test results, treatment options, and if necessary, the need for more tests with your health care provider. Vaccines  Your health care provider may recommend certain vaccines, such as: Influenza vaccine. This is recommended every year. Tetanus, diphtheria, and acellular pertussis (Tdap, Td) vaccine. You may need a Td booster every 10 years. Zoster vaccine. You may need this after age 71. Pneumococcal 13-valent conjugate (PCV13) vaccine. You may need this if you have certain conditions and were not previously vaccinated. Pneumococcal polysaccharide (PPSV23) vaccine. You may need one or two doses if you smoke cigarettes or if you have certain conditions. Talk to your health care provider about which screenings and vaccines you need and how often you need them. This information is not intended to replace advice given to you by your health care provider. Make sure you discuss any questions you have with your health care provider. Document Released: 01/14/2015 Document Revised: 09/07/2015 Document Reviewed: 10/19/2014 Elsevier Interactive Patient Education  2017 [employer] Prevention in the Home Falls can cause injuries. They can happen to people of all ages. There are many things you can do to make your home safe and to help prevent falls. What can I do on the outside of my home? Regularly fix the edges of walkways and driveways and fix any cracks. Remove anything that might make you trip as you walk through a door, such as a raised step or threshold. Trim any bushes or trees on the path to your home. Use bright outdoor lighting. Clear any walking paths of anything that might make someone trip, such as rocks or tools. Regularly check to see if handrails are loose or broken. Make sure that both sides of any steps have  handrails. Any raised decks and porches should have guardrails on the edges. Have any leaves, snow, or ice cleared regularly. Use sand or salt on walking paths during winter. Clean up any spills in your garage right away. This includes oil or grease spills. What can I do in the bathroom? Use night lights. Install grab bars by the toilet and in the tub and shower. Do not use towel bars as grab bars. Use non-skid mats or decals in the tub or shower. If you need to sit down in the shower, use a plastic, non-slip stool. Keep the floor dry. Clean up any water that spills on the floor as soon as it happens. Remove soap buildup in the tub or shower regularly. Attach bath mats securely with double-sided non-slip rug tape. Do not have throw rugs and other things on the floor that can make you trip. What can I do in the bedroom? Use night lights. Make sure that you have a light by your bed that is easy to reach. Do not use any sheets or blankets that are too big for your bed. They should not hang down onto the floor. Have a firm chair that has side arms. You can use this for support while you get dressed. Do not have throw rugs and other things on the floor that can make you trip. What can I do in the kitchen? Clean up any spills right away. Avoid walking on wet floors. Keep items that you use a lot in easy-to-reach  places. If you need to reach something above you, use a strong step stool that has a grab bar. Keep electrical cords out of the way. Do not use floor polish or wax that makes floors slippery. If you must use wax, use non-skid floor wax. Do not have throw rugs and other things on the floor that can make you trip. What can I do with my stairs? Do not leave any items on the stairs. Make sure that there are handrails on both sides of the stairs and use them. Fix handrails that are broken or loose. Make sure that handrails are as long as the stairways. Check any carpeting to make sure  that it is firmly attached to the stairs. Fix any carpet that is loose or worn. Avoid having throw rugs at the top or bottom of the stairs. If you do have throw rugs, attach them to the floor with carpet tape. Make sure that you have a light switch at the top of the stairs and the bottom of the stairs. If you do not have them, ask someone to add them for you. What else can I do to help prevent falls? Wear shoes that: Do not have high heels. Have rubber bottoms. Are comfortable and fit you well. Are closed at the toe. Do not wear sandals. If you use a stepladder: Make sure that it is fully opened. Do not climb a closed stepladder. Make sure that both sides of the stepladder are locked into place. Ask someone to hold it for you, if possible. Clearly mark and make sure that you can see: Any grab bars or handrails. First and last steps. Where the edge of each step is. Use tools that help you move around (mobility aids) if they are needed. These include: Canes. Walkers. Scooters. Crutches. Turn on the lights when you go into a dark area. Replace any light bulbs as soon as they burn out. Set up your furniture so you have a clear path. Avoid moving your furniture around. If any of your floors are uneven, fix them. If there are any pets around you, be aware of where they are. Review your medicines with your doctor. Some medicines can make you feel dizzy. This can increase your chance of falling. Ask your doctor what other things that you can do to help prevent falls. This information is not intended to replace advice given to you by your health care provider. Make sure you discuss any questions you have with your health care provider. Document Released: 10/14/2008 Document Revised: 05/26/2015 Document Reviewed: 01/22/2014 Elsevier Interactive Patient Education  2017 [employer].

## 2020-08-29 NOTE — Progress Notes (Signed)
I connected with Melissa [name] today by telephone and verified that I am speaking with the correct person using two identifiers. Location patient: home Location provider: work Persons participating in the virtual visit: patient, provider.   I discussed the limitations, risks, security and privacy concerns of performing an evaluation and management service by telephone and the availability of in person appointments. I also discussed with the patient that there may be a patient responsible charge related to this service. The patient expressed understanding and verbally consented to this telephonic visit.    Interactive audio and video telecommunications were attempted between this provider and patient, however failed, due to patient having technical difficulties OR patient did not have access to video capability.  We continued and completed visit with audio only.  Some vital signs may be absent or patient reported.   Time Spent with patient on telephone encounter: 30 minutes  Subjective:   Melissa [name] is a 45 y.o. female who presents for an Initial Medicare Annual Wellness Visit.  Review of Systems     Cardiac Risk Factors include: family history of premature cardiovascular disease     Objective:    There were no vitals filed for this visit. There is no height or weight on file to calculate BMI.  Advanced Directives 08/29/2020 09/21/2015 06/17/2014 05/17/2014 01/27/2014 04/17/2013 04/14/2013  Does Patient Have a Medical Advance Directive? Yes No No No No Patient does not have advance directive Patient does not have advance directive;Patient would not like information  Type of Advance Directive Living will - - - - - -  Would patient like information on creating a medical advance directive? - No - patient declined information No - patient declined information No - patient declined information Yes - Educational materials given - -    Current Medications (verified) Outpatient  Encounter Medications as of 08/29/2020  Medication Sig   ammonium lactate (LAC-HYDRIN) 12 % lotion Apply 1 application topically as needed for dry skin.   amphetamine-dextroamphetamine (ADDERALL) 20 MG tablet TAKE 1 TABLET BY MOUTH IN THE MORNING AND 1 EVERYDAY AT NOON AND 1 TABLET AT 4PM   azelastine (ASTELIN) 0.1 % nasal spray Place 2 sprays into both nostrils 2 (two) times daily. Use in each nostril as directed   fluticasone (FLONASE) 50 MCG/ACT nasal spray Use 2 spray(s) in each nostril once daily   ibuprofen (ADVIL) 800 MG tablet Take 1 tablet by mouth twice daily (Patient taking differently: as needed.)   lansoprazole (PREVACID SOLUTAB) 30 MG disintegrating tablet DISSOLVE 1 TABLET IN MOUTH IN THE MORNING AND AT BEDTIME   levonorgestrel (MIRENA) 20 MCG/24HR IUD 1 each by Intrauterine route once.   naproxen (NAPROSYN) 500 MG tablet Take 1 tablet (500 mg total) by mouth 2 (two) times daily as needed.   ondansetron (ZOFRAN) 4 MG tablet Take 1 tablet (4 mg total) by mouth every 8 (eight) hours as needed.   Salicylic Acid 3 % SHAM Use daily as directed for dry scalp   selenium sulfide (SELSUN) 2.5 % shampoo USE LOTION EXTERNALLY  TO AFFECTED AREA ONCE DAILY AS NEEDED IRRITATION   sulfamethoxazole-trimethoprim (BACTRIM DS) 800-160 MG tablet Take 1 tablet by mouth 2 (two) times daily.   venlafaxine XR (EFFEXOR XR) 37.5 MG 24 hr capsule Take 1 capsule (37.5 mg total) by mouth daily with breakfast.   Vitamin D, Ergocalciferol, (DRISDOL) 1.25 MG (50000 UNIT) CAPS capsule Take 1 capsule (50,000 Units total) by mouth every 7 (seven) days.   XIIDRA 5 %  SOLN INSTILL 1 DROP TWICE DAILY INTO EACH EYE   No facility-administered encounter medications on file as of 08/29/2020.    Allergies (verified) Patient has no known allergies.   History: Past Medical History:  Diagnosis Date   Anxiety    Arthritis    Carpal tunnel syndrome    Depression    GERD (gastroesophageal reflux disease)    Hammer toe  04/2013   bilateral 4th and 5th    Ingrown left big toenail 04/2013   Mental disorder    Neuromuscular disorder ([place])    Post term pregnancy over 40 weeks 09/21/2015   SVD (spontaneous vaginal delivery) 09/22/2015   UTI (lower urinary tract infection)    Past Surgical History:  Procedure Laterality Date   BREAST LIFT Bilateral    CORRECTION HAMMER TOE Bilateral    CRYOABLATION  2004   CYSTOSCOPY WITH HYDRODISTENSION AND BIOPSY  07/28/2004   with urethral dilatation   DILATION AND CURETTAGE OF UTERUS     DILATION AND EVACUATION  12/21/2011   Procedure: DILATATION AND EVACUATION;  Surgeon: Terrance Mass, MD;  Location: [facility];  Service: Gynecology;  Laterality: N/A;  first trimester    HAMMER TOE SURGERY Bilateral 04/20/2013   Procedure: BILATERAL HAMMER TOE REPAIR AND EXCISION NAIL PERMANENT LEFT FIRST;  Surgeon: Harriet Masson, DPM;  Location: [facility];  Service: Podiatry;  Laterality: Bilateral;  Hammer toe repair fourth and fifth toes bilateral feet with screw placement fourth toes and partial nail excision left great toe   HYSTEROSALPINGOGRAM     REDUCTION MAMMAPLASTY Bilateral 2020   TOE SURGERY     ingrown toenail   WISDOM TOOTH EXTRACTION     Family History  Problem Relation Age of Onset   Lung cancer Father    Lung cancer Paternal Grandfather    Seizures Mother    Arthritis Mother    Thyroid disease Maternal Grandmother    Heart disease Paternal Grandmother        2 CABG   Social History   Socioeconomic History   Marital status: Single    Spouse name: Not on file   Number of children: 1   Years of education: Not on file   Highest education level: Not on file  Occupational History   Occupation: Homemaker  Tobacco Use   Smoking status: Never   Smokeless tobacco: Never  Vaping Use   Vaping Use: Never used  Substance and Sexual Activity   Alcohol use: Yes    Comment: occasionally   Drug use: No   Sexual activity: Yes     Birth control/protection: I.U.D.  Other Topics Concern   Not on file  Social History Narrative   Not on file   Social Determinants of Health   Financial Resource Strain: Low Risk    Difficulty of Paying Living Expenses: Not hard at all  Food Insecurity: No Food Insecurity   Worried About Charity fundraiser in the Last Year: Never true   [place] in the Last Year: Never true  Transportation Needs: No Transportation Needs   Lack of Transportation (Medical): No   Lack of Transportation (Non-Medical): No  Physical Activity: Sufficiently Active   Days of Exercise per Week: 5 days   Minutes of Exercise per Session: 30 min  Stress: No Stress Concern Present   Feeling of Stress : Not at all  Social Connections: Moderately Integrated   Frequency of Communication with Friends and Family: More than three  times a week   Frequency of Social Gatherings with Friends and Family: More than three times a week   Attends Religious Services: More than 4 times per year   Active Member of [employer] or Organizations: Yes   Attends Music therapist: More than 4 times per year   Marital Status: Never married    Tobacco Counseling Counseling given: Not Answered   Clinical Intake:  Pre-visit preparation completed: Yes  Pain : No/denies pain     Nutritional Risks: None Diabetes: No  How often do you need to have someone help you when you read instructions, pamphlets, or other written materials from your doctor or pharmacy?: 1 - Never What is the last grade level you completed in school?: High School Graduate  Diabetic? no  Interpreter Needed?: No  Information entered by :: Lisette Abu, LPN   Activities of Daily Living In your present state of health, do you have any difficulty performing the following activities: 08/29/2020  Hearing? N  Vision? N  Difficulty concentrating or making decisions? N  Walking or climbing stairs? N  Dressing or bathing? N  Doing  errands, shopping? N  Preparing Food and eating ? N  Using the Toilet? N  In the past six months, have you accidently leaked urine? N  Do you have problems with loss of bowel control? N  Managing your Medications? N  Managing your Finances? N  Housekeeping or managing your Housekeeping? N  Some recent data might be hidden    Patient Care Team: Marrian Salvage, [place] as PCP - General (Internal Medicine) [place], [facility] as Consulting Physician (Urology)  Indicate any recent Medical Services you may have received from other than [facility] providers in the past year (date may be approximate).     Assessment:   This is a routine wellness examination for K[place].  Hearing/Vision screen Hearing Screening - Comments:: Patient denied any hearing difficulty. Vision Screening - Comments:: Patient wears eyeglasses.  Annual [facility] exam done by [facility], [facility]. [facility]).  Dietary issues and exercise activities discussed: Current Exercise Habits: Home exercise routine, Type of exercise: walking, Time (Minutes): 30, Frequency (Times/Week): 5, Weekly Exercise (Minutes/Week): 150, Intensity: Moderate, Exercise limited by: None identified   Goals Addressed               This Visit's Progress     Patient Stated (pt-stated)        My goal is to lose 10 pounds.      Depression Screen PHQ 2/9 Scores 08/29/2020 10/09/2016 12/12/2015 06/17/2014 05/17/2014 04/17/2013  PHQ - 2 Score 0 0 0 0 0 0  PHQ- 9 Score - 12 - - - -  Exception Documentation - - - Other- indicate reason in comment box - -    Fall Risk Fall Risk  08/29/2020 10/09/2016 12/12/2015 06/17/2014  Falls in the past year? 0 No No No  Number falls in past yr: 0 - - -  Injury with Fall? 0 - - -  Risk for fall due to : No Fall Risks - - Other (Comment)  Follow up Falls evaluation completed - - -    FALL RISK PREVENTION PERTAINING TO THE HOME:  Any stairs in or around the home? Yes  If so, are there any  without handrails? No  Home free of loose throw rugs in walkways, pet beds, electrical cords, etc? Yes  Adequate lighting in your home to reduce risk of falls? Yes   ASSISTIVE  DEVICES UTILIZED TO PREVENT FALLS:  Life alert? No  Use of a cane, walker or w/c? No  Grab bars in the bathroom? No  Shower chair or bench in shower? No  Elevated toilet seat or a handicapped toilet? No   TIMED UP AND GO:  Was the test performed? No .  Length of time to ambulate 10 feet: n/a sec.   Gait steady and fast without use of assistive device  Cognitive Function: Normal cognitive status assessed by direct observation by this Nurse Health Advisor. No abnormalities found.          Immunizations Immunization History  Administered Date(s) Administered   PFIZER(Purple Top)SARS-COV-2 Vaccination 04/06/2019, 04/27/2019   PPD Test 06/26/2013   Rho (D) Immune Globulin 01/22/2011, 12/21/2011   Tdap 09/23/2015    TDAP status: Up to date  Flu Vaccine status: Declined, Education has been provided regarding the importance of this vaccine but patient still declined. Advised may receive this vaccine at local pharmacy or Health Dept. Aware to provide a copy of the vaccination record if obtained from local pharmacy or Health Dept. Verbalized acceptance and understanding.  Pneumococcal vaccine status: Declined,  Education has been provided regarding the importance of this vaccine but patient still declined. Advised may receive this vaccine at local pharmacy or Health Dept. Aware to provide a copy of the vaccination record if obtained from local pharmacy or Health Dept. Verbalized acceptance and understanding.   Covid-19 vaccine status: Completed vaccines  Qualifies for Shingles Vaccine? No   Zostavax completed No   Shingrix Completed?: No.    Education has been provided regarding the importance of this vaccine. Patient has been advised to call insurance company to determine out of pocket expense if they have  not yet received this vaccine. Advised may also receive vaccine at local pharmacy or Health Dept. Verbalized acceptance and understanding.  Screening Tests Health Maintenance  Topic Date Due   Pneumococcal Vaccine 52-5 Years old (1 - PCV) Never done   PAP SMEAR-Modifier  02/16/2019   TETANUS/TDAP  09/22/2025   Hepatitis C Screening  Completed   HIV Screening  Completed   HPV VACCINES  Aged Out   INFLUENZA VACCINE  Discontinued   COVID-19 Vaccine  Discontinued    Health Maintenance  Health Maintenance Due  Topic Date Due   Pneumococcal Vaccine 47-68 Years old (1 - PCV) Never done   PAP SMEAR-Modifier  02/16/2019    Colorectal screening: never done  Mammogram status: Completed 04/27/2020. Repeat every year  Bone density status: never done  Lung Cancer Screening: (Low Dose CT Chest recommended if Age 6-80 years, 30 pack-year currently smoking OR have quit w/in 15years.) does not qualify.   Lung Cancer Screening Referral: no  Additional Screening:  Hepatitis C Screening: does not qualify; Completed no  Vision Screening: Recommended annual ophthalmology exams for early detection of glaucoma and other disorders of the eye. Is the patient up to date with their annual eye exam?  Yes  Who is the provider or what is the name of the office in which the patient attends annual eye exams? [place], [place]. If pt is not established with a provider, would they like to be referred to a provider to establish care? No .   Dental Screening: Recommended annual dental exams for proper oral hygiene  Community Resource Referral / Chronic Care Management: CRR required this visit?  No   CCM required this visit?  No      Plan:  I have personally reviewed and noted the following in the patient's chart:   Medical and social history Use of alcohol, tobacco or illicit drugs  Current medications and supplements including opioid prescriptions. Patient is not currently taking opioid  prescriptions. Functional ability and status Nutritional status Physical activity Advanced directives List of other physicians Hospitalizations, surgeries, and ER visits in previous 12 months Vitals Screenings to include cognitive, depression, and falls Referrals and appointments  In addition, I have reviewed and discussed with patient certain preventive protocols, quality metrics, and best practice recommendations. A written personalized care plan for preventive services as well as general preventive health recommendations were provided to patient.     Sheral Flow, LPN   [id number]   Nurse Notes:  Patient is cogitatively intact. There were no vitals filed for this visit. There is no height or weight on file to calculate BMI. Patient stated that she has no issues with gait or balance; does not use any assistive devices. Hearing Screening - Comments:: Patient denied any hearing difficulty. Vision Screening - Comments:: Patient wears eyeglasses.  Annual eye exam done by [facility], [facility]. [facility]).

## 2020-09-06 NOTE — Progress Notes (Deleted)
[facility] Phone: [phone] Subjective:    I'm seeing this patient by the request  of:  Marrian Salvage, FNP  CC:   [id number]  ROLLA HARDIMON is a 45 y.o. female coming in with complaint of back and neck pain. OMT 07/27/2020. Patient states   Medications patient has been prescribed: None  Taking:         Reviewed prior external information including notes and imaging from previsou exam, outside providers and external EMR if available.   As well as notes that were available from care everywhere and other healthcare systems.  Past medical history, social, surgical and family history all reviewed in electronic medical record.  No pertanent information unless stated regarding to the chief complaint.   Past Medical History:  Diagnosis Date   Anxiety    Arthritis    Carpal tunnel syndrome    Depression    GERD (gastroesophageal reflux disease)    Hammer toe 04/2013   bilateral 4th and 5th    Ingrown left big toenail 04/2013   Mental disorder    Neuromuscular disorder ([place])    Post term pregnancy over 40 weeks 09/21/2015   SVD (spontaneous vaginal delivery) 09/22/2015   UTI (lower urinary tract infection)     No Known Allergies   Review of Systems:  No headache, visual changes, nausea, vomiting, diarrhea, constipation, dizziness, abdominal pain, skin rash, fevers, chills, night sweats, weight loss, swollen lymph nodes, body aches, joint swelling, chest pain, shortness of breath, mood changes. POSITIVE muscle aches  Objective  There were no vitals taken for this visit.   General: No apparent distress alert and oriented x3 mood and affect normal, dressed appropriately.  HEENT: Pupils equal, extraocular movements intact  Respiratory: Patient's speak in full sentences and does not appear short of breath  Cardiovascular: No lower extremity edema, non tender, no erythema  Neuro: Cranial  nerves II through XII are intact, neurovascularly intact in all extremities with 2+ DTRs and 2+ pulses.  Gait normal with good balance and coordination.  MSK:  Non tender with full range of motion and good stability and symmetric strength and tone of shoulders, elbows, wrist, hip, knee and ankles bilaterally.  Back - Normal skin, Spine with normal alignment and no deformity.  No tenderness to vertebral process palpation.  Paraspinous muscles are not tender and without spasm.   Range of motion is full at neck and lumbar sacral regions  Osteopathic findings  C2 flexed rotated and side bent right C6 flexed rotated and side bent left T3 extended rotated and side bent right inhaled rib T9 extended rotated and side bent left L2 flexed rotated and side bent right Sacrum right on right       Assessment and Plan:    Nonallopathic problems  Decision today to treat with OMT was based on Physical Exam  After verbal consent patient was treated with HVLA, ME, FPR techniques in cervical, rib, thoracic, lumbar, and sacral  areas  Patient tolerated the procedure well with improvement in symptoms  Patient given exercises, stretches and lifestyle modifications  See medications in patient instructions if given  Patient will follow up in 4-8 weeks      The above documentation has been reviewed and is accurate and complete [name]       Note: This dictation was prepared with Dragon dictation along with smaller phrase technology. Any transcriptional errors that result from this process are  unintentional.

## 2020-09-07 ENCOUNTER — Ambulatory Visit: Payer: Medicare Other | Admitting: Family Medicine

## 2020-09-19 ENCOUNTER — Encounter: Payer: Medicare Other | Admitting: Emergency Medicine

## 2020-09-20 ENCOUNTER — Ambulatory Visit (INDEPENDENT_AMBULATORY_CARE_PROVIDER_SITE_OTHER): Payer: Medicare Other | Admitting: Emergency Medicine

## 2020-09-20 ENCOUNTER — Other Ambulatory Visit: Payer: Self-pay

## 2020-09-20 ENCOUNTER — Encounter: Payer: Self-pay | Admitting: Emergency Medicine

## 2020-09-20 VITALS — BP 122/82 | HR 83 | Temp 98.0°F | Ht 61.0 in | Wt 153.0 lb

## 2020-09-20 DIAGNOSIS — Z7689 Persons encountering health services in other specified circumstances: Secondary | ICD-10-CM | POA: Diagnosis not present

## 2020-09-20 DIAGNOSIS — Z8659 Personal history of other mental and behavioral disorders: Secondary | ICD-10-CM

## 2020-09-20 DIAGNOSIS — Z23 Encounter for immunization: Secondary | ICD-10-CM

## 2020-09-20 DIAGNOSIS — F319 Bipolar disorder, unspecified: Secondary | ICD-10-CM | POA: Diagnosis not present

## 2020-09-20 DIAGNOSIS — Z8719 Personal history of other diseases of the digestive system: Secondary | ICD-10-CM | POA: Diagnosis not present

## 2020-09-20 NOTE — Patient Instructions (Signed)
Health Maintenance, Female Adopting a healthy lifestyle and getting preventive care are important in promoting health and wellness. Ask your health care provider about: The right schedule for you to have regular tests and exams. Things you can do on your own to prevent diseases and keep yourself healthy. What should I know about diet, weight, and exercise? Eat a healthy diet  Eat a diet that includes plenty of vegetables, fruits, low-fat dairy products, and lean protein. Do not eat a lot of foods that are high in solid fats, added sugars, or sodium. Maintain a healthy weight Body mass index (BMI) is used to identify weight problems. It estimates body fat based on height and weight. Your health care provider can help determine your BMI and help you achieve or maintain a healthy weight. Get regular exercise Get regular exercise. This is one of the most important things you can do for your health. Most adults should: Exercise for at least 150 minutes each week. The exercise should increase your heart rate and make you sweat (moderate-intensity exercise). Do strengthening exercises at least twice a week. This is in addition to the moderate-intensity exercise. Spend less time sitting. Even light physical activity can be beneficial. Watch cholesterol and blood lipids Have your blood tested for lipids and cholesterol at 45 years of age, then have this test every 5 years. Have your cholesterol levels checked more often if: Your lipid or cholesterol levels are high. You are older than 45 years of age. You are at high risk for heart disease. What should I know about cancer screening? Depending on your health history and family history, you may need to have cancer screening at various ages. This may include screening for: Breast cancer. Cervical cancer. Colorectal cancer. Skin cancer. Lung cancer. What should I know about heart disease, diabetes, and high blood pressure? Blood pressure and heart  disease High blood pressure causes heart disease and increases the risk of stroke. This is more likely to develop in people who have high blood pressure readings, are of African descent, or are overweight. Have your blood pressure checked: Every 3-5 years if you are 18-39 years of age. Every year if you are 40 years old or older. Diabetes Have regular diabetes screenings. This checks your fasting blood sugar level. Have the screening done: Once every three years after age 40 if you are at a normal weight and have a low risk for diabetes. More often and at a younger age if you are overweight or have a high risk for diabetes. What should I know about preventing infection? Hepatitis B If you have a higher risk for hepatitis B, you should be screened for this virus. Talk with your health care provider to find out if you are at risk for hepatitis B infection. Hepatitis C Testing is recommended for: Everyone born from 1945 through 1965. Anyone with known risk factors for hepatitis C. Sexually transmitted infections (STIs) Get screened for STIs, including gonorrhea and chlamydia, if: You are sexually active and are younger than 45 years of age. You are older than 45 years of age and your health care provider tells you that you are at risk for this type of infection. Your sexual activity has changed since you were last screened, and you are at increased risk for chlamydia or gonorrhea. Ask your health care provider if you are at risk. Ask your health care provider about whether you are at high risk for HIV. Your health care provider may recommend a prescription medicine   to help prevent HIV infection. If you choose to take medicine to prevent HIV, you should first get tested for HIV. You should then be tested every 3 months for as long as you are taking the medicine. Pregnancy If you are about to stop having your period (premenopausal) and you may become pregnant, seek counseling before you get  pregnant. Take 400 to 800 micrograms (mcg) of folic acid every day if you become pregnant. Ask for birth control (contraception) if you want to prevent pregnancy. Osteoporosis and menopause Osteoporosis is a disease in which the bones lose minerals and strength with aging. This can result in bone fractures. If you are 65 years old or older, or if you are at risk for osteoporosis and fractures, ask your health care provider if you should: Be screened for bone loss. Take a calcium or vitamin D supplement to lower your risk of fractures. Be given hormone replacement therapy (HRT) to treat symptoms of menopause. Follow these instructions at home: Lifestyle Do not use any products that contain nicotine or tobacco, such as cigarettes, e-cigarettes, and chewing tobacco. If you need help quitting, ask your health care provider. Do not use street drugs. Do not share needles. Ask your health care provider for help if you need support or information about quitting drugs. Alcohol use Do not drink alcohol if: Your health care provider tells you not to drink. You are pregnant, may be pregnant, or are planning to become pregnant. If you drink alcohol: Limit how much you use to 0-1 drink a day. Limit intake if you are breastfeeding. Be aware of how much alcohol is in your drink. In the [place] one drink equals one 12 oz bottle of beer (355 mL), one 5 oz glass of wine (148 mL), or one 1 oz glass of hard liquor (44 mL). General instructions Schedule regular health, dental, and eye exams. Stay current with your vaccines. Tell your health care provider if: You often feel depressed. You have ever been abused or do not feel safe at home. Summary Adopting a healthy lifestyle and getting preventive care are important in promoting health and wellness. Follow your health care provider's instructions about healthy diet, exercising, and getting tested or screened for diseases. Follow your health care provider's  instructions on monitoring your cholesterol and blood pressure. This information is not intended to replace advice given to you by your health care provider. Make sure you discuss any questions you have with your health care provider. Document Revised: 02/26/2020 Document Reviewed: 12/11/2017 [employer] Patient Education  2022 [employer]  

## 2020-09-20 NOTE — Progress Notes (Signed)
Melissa [name] 44 y.o.   Chief Complaint  Patient presents with   Transfer of Care    Melissa [name] pt    HISTORY OF PRESENT ILLNESS: This is a 45 y.o. female here for transfer of care.  Used to see Marvis Repress, FNP. Has multiple chronic medical problems including the following: 1.  History of bipolar disorder and ADD.  Sees Dr. Robina Ade, psychiatrist on a regular basis.  Also has a history of chronic anxiety and occasional depression.  On Adderall, Lexapro, and Effexor 2.  History of chronic musculoskeletal problems.  Sees Dr. Tamala Julian, sports medicine on a regular basis. 3.  History of GERD with recent esophageal dilatation.  However requesting new GI doctor and referral. 4.  Hormonal imbalances.  Sees gynecologist on a regular basis 5.  Concerns about her weight.  Has a nutritionist she sees on a regular basis. Today also complaining of small lump to dorsum of right foot.  HPI   Prior to Admission medications   Medication Sig Start Date End Date Taking? Authorizing Provider  ammonium lactate (LAC-HYDRIN) 12 % lotion Apply 1 application topically as needed for dry skin. 10/15/19  Yes Hyatt, Max T, DPM  amphetamine-dextroamphetamine (ADDERALL) 20 MG tablet TAKE 1 TABLET BY MOUTH IN THE MORNING AND 1 EVERYDAY AT NOON AND 1 TABLET AT 4PM 11/11/17  Yes [provider]  azelastine (ASTELIN) 0.1 % nasal spray Place 2 sprays into both nostrils 2 (two) times daily. Use in each nostril as directed 11/05/19  Yes Marrian Salvage, FNP  Fluocinolone Acetonide Body 0.01 % OIL fluocinolone 0.01 % scalp oil and shower cap  APPLY TO SCALP AT [facility] UPON AWAKENING   Yes [provider]  fluticasone (FLONASE) 50 MCG/ACT nasal spray Use 2 spray(s) in each nostril once daily 10/28/19  Yes Marrian Salvage, FNP  lansoprazole (PREVACID SOLUTAB) 30 MG disintegrating tablet DISSOLVE 1 TABLET IN MOUTH IN THE MORNING AND AT BEDTIME 07/21/20  Yes Nandigam, Venia Minks, MD   levonorgestrel (MIRENA) 20 MCG/24HR IUD 1 each by Intrauterine route once.   Yes [provider]  Salicylic Acid 3 % SHAM Use daily as directed for dry scalp 09/23/17  Yes Marrian Salvage, FNP  selenium sulfide (SELSUN) 2.5 % shampoo USE LOTION EXTERNALLY  TO AFFECTED AREA ONCE DAILY AS NEEDED IRRITATION 02/15/20  Yes Marrian Salvage, FNP  Vitamin D, Ergocalciferol, (DRISDOL) 1.25 MG (50000 UNIT) CAPS capsule Take 1 capsule (50,000 Units total) by mouth every 7 (seven) days. 10/15/19  Yes Hulan Saas M, DO  XIIDRA 5 % SOLN INSTILL 1 DROP TWICE DAILY INTO [facility] 02/02/20  Yes [provider]  escitalopram (LEXAPRO) 20 MG tablet escitalopram 20 mg tablet  TAKE 1 TABLET BY MOUTH EVERY MORNING Patient not taking: Reported on 09/20/2020    [provider]  ibuprofen (ADVIL) 800 MG tablet Take 1 tablet by mouth twice daily Patient not taking: Reported on 09/20/2020 12/12/18   Lyndal Pulley, DO  naproxen (NAPROSYN) 500 MG tablet Take 1 tablet (500 mg total) by mouth 2 (two) times daily as needed. Patient not taking: Reported on 09/20/2020 10/06/19   Lyndal Pulley, DO  ondansetron (ZOFRAN) 4 MG tablet Take 1 tablet (4 mg total) by mouth every 8 (eight) hours as needed. Patient not taking: Reported on 09/20/2020 07/09/19   Tyson Dense T, DPM  venlafaxine XR (EFFEXOR XR) 37.5 MG 24 hr capsule Take 1 capsule (37.5 mg total) by mouth daily with breakfast. Patient  not taking: Reported on 09/20/2020 07/27/20   Lyndal Pulley, DO    Allergies  Allergen Reactions   Bactrim [Sulfamethoxazole-Trimethoprim]     Patient Active Problem List   Diagnosis Date Noted   Harriet Pho disease (radial styloid tenosynovitis) 03/31/2020   Nonallopathic lesion of lumbosacral region 03/01/2019   Nonallopathic lesion of cervical region 03/01/2019   Nonallopathic lesion of sacral region 03/01/2019   Nonallopathic lesion of thoracic region 03/01/2019   Nonallopathic lesion of rib  cage 12/29/2019   Vitamin D deficiency 05/08/2018   Neck pain 06/17/2017   Right lateral epicondylitis 04/15/2017   Piriformis syndrome, left 02/25/2017   Vaginal discharge 09/11/2016   Tendinitis of extensor tendon of right hand 06/11/2016   Mixed stress and urge urinary incontinence 02/07/2016   Carpal tunnel syndrome 01/03/2016   Polyarthralgia 01/03/2016   Galactocele associated with childbirth 11/07/2015   Pregnancy with poor obstetric history 02/21/2015   Multigravida of advanced maternal age 12/21/2015   Radial tunnel syndrome 06/17/2014   Cervical radiculopathy 06/10/2014   Bilateral low back pain with right-sided sciatica 06/10/2014   Bipolar depression ([place]) 05/17/2014   Chronic night sweats 06/26/2013   History of recurrent spontaneous abortion, not currently pregnant 01/26/2012   Fluid retention 01/25/2012   Obesity (BMI 30.0-34.9) 01/25/2012    Past Medical History:  Diagnosis Date   Anxiety    Arthritis    Carpal tunnel syndrome    Depression    GERD (gastroesophageal reflux disease)    Hammer toe 04/2013   bilateral 4th and 5th    Ingrown left big toenail 04/2013   Mental disorder    Neuromuscular disorder ([place])    Post term pregnancy over 40 weeks 09/21/2015   SVD (spontaneous vaginal delivery) 09/22/2015   UTI (lower urinary tract infection)     Past Surgical History:  Procedure Laterality Date   BREAST LIFT Bilateral    CORRECTION HAMMER TOE Bilateral    CRYOABLATION  2004   CYSTOSCOPY WITH HYDRODISTENSION AND BIOPSY  07/28/2004   with urethral dilatation   DILATION AND CURETTAGE OF UTERUS     DILATION AND EVACUATION  12/21/2011   Procedure: DILATATION AND EVACUATION;  Surgeon: Terrance Mass, MD;  Location: [place];  Service: Gynecology;  Laterality: N/A;  first trimester    HAMMER TOE SURGERY Bilateral 04/20/2013   Procedure: BILATERAL HAMMER TOE REPAIR AND EXCISION NAIL PERMANENT LEFT FIRST;  Surgeon: Harriet Masson, DPM;   Location: [place];  Service: Podiatry;  Laterality: Bilateral;  Hammer toe repair fourth and fifth toes bilateral feet with screw placement fourth toes and partial nail excision left great toe   HYSTEROSALPINGOGRAM     REDUCTION MAMMAPLASTY Bilateral 2020   TOE SURGERY     ingrown toenail   WISDOM TOOTH EXTRACTION      Social History   Socioeconomic History   Marital status: Single    Spouse name: Not on file   Number of children: 1   Years of education: Not on file   Highest education level: Not on file  Occupational History   Occupation: Homemaker  Tobacco Use   Smoking status: Never   Smokeless tobacco: Never  Vaping Use   Vaping Use: Never used  Substance and Sexual Activity   Alcohol use: Yes    Comment: occasionally   Drug use: No   Sexual activity: Yes    Birth control/protection: I.U.D.  Other Topics Concern   Not on file  Social History Narrative  Not on file   Social Determinants of Health   Financial Resource Strain: Low Risk    Difficulty of Paying Living Expenses: Not hard at all  Food Insecurity: No Food Insecurity   Worried About Charity fundraiser in the Last Year: Never true   [place] in the Last Year: Never true  Transportation Needs: No Transportation Needs   Lack of Transportation (Medical): No   Lack of Transportation (Non-Medical): No  Physical Activity: Sufficiently Active   Days of Exercise per Week: 5 days   Minutes of Exercise per Session: 30 min  Stress: No Stress Concern Present   Feeling of Stress : Not at all  Social Connections: Moderately Integrated   Frequency of Communication with Friends and Family: More than three times a week   Frequency of Social Gatherings with Friends and Family: More than three times a week   Attends Religious Services: More than 4 times per year   Active Member of [employer] or Organizations: Yes   Attends Music therapist: More than 4 times per year   Marital Status:  Never married  Human resources officer Violence: Not At Risk   Fear of Current or Ex-Partner: No   Emotionally Abused: No   Physically Abused: No   Sexually Abused: No    Family History  Problem Relation Age of Onset   Lung cancer Father    Lung cancer Paternal Grandfather    Seizures Mother    Arthritis Mother    Thyroid disease Maternal Grandmother    Heart disease Paternal Grandmother        2 CABG     Review of Systems  Constitutional: Negative.  Negative for chills and fever.  HENT: Negative.  Negative for congestion and sore throat.   Eyes: Negative.   Respiratory: Negative.  Negative for cough and shortness of breath.   Cardiovascular: Negative.  Negative for chest pain and palpitations.  Gastrointestinal:  Negative for abdominal pain, diarrhea, nausea and vomiting.  Genitourinary: Negative.  Negative for dysuria and hematuria.  Musculoskeletal:  Positive for back pain.  Skin: Negative.  Negative for rash.       Small lump to right foot  Neurological:  Negative for dizziness and headaches.  All other systems reviewed and are negative.   Physical Exam Vitals reviewed.  Constitutional:      Appearance: Normal appearance.  HENT:     Head: Normocephalic.  Eyes:     Extraocular Movements: Extraocular movements intact.     Pupils: Pupils are equal, round, and reactive to light.  Cardiovascular:     Rate and Rhythm: Normal rate and regular rhythm.     Pulses: Normal pulses.     Heart sounds: Normal heart sounds.  Pulmonary:     Effort: Pulmonary effort is normal.     Breath sounds: Normal breath sounds.  Musculoskeletal:        General: Normal range of motion.     Cervical back: Normal range of motion and neck supple. No tenderness.     Right lower leg: No edema.     Left lower leg: No edema.  Lymphadenopathy:     Cervical: No cervical adenopathy.  Skin:    General: Skin is warm and dry.     Comments: Right foot: Very small sebaceous cyst to dorsum of the foot.   Nontender.  No signs of infection.  Neurological:     General: No focal deficit present.     Mental  Status: She is alert and oriented to person, place, and time.  Psychiatric:        Mood and Affect: Mood normal.        Behavior: Behavior normal.     ASSESSMENT & PLAN: A total of 35 minutes was spent with the patient and counseling/coordination of care regarding preparing for this visit, reviewing most recent office visit notes, review of past medical history, review of all medications, establishing care with me, comprehensive history and physical exam, education on nutrition, health maintenance items, documentation, prognosis and need for follow-up.  Melissa [name] was seen today for transfer of care.  Diagnoses and all orders for this visit:  Bipolar depression ([place])  Need for influenza vaccination -     Flu Vaccine QUAD 108mo+IM (Fluarix, Fluzone & Alfiuria Quad PF)  Encounter to establish care  History of gastroesophageal reflux (GERD) -     Ambulatory referral to Gastroenterology  History of attention deficit disorder Clinically stable.  No medical concerns identified during this visit.  Continue present medications.  No changes. Must continue to follow-up with psychiatrist and sports medicine doctor as already scheduled. Follow-up for chronic medical problems in 6 months.  Patient Instructions  Health Maintenance, Female Adopting a healthy lifestyle and getting preventive care are important in promoting health and wellness. Ask your health care provider about: The right schedule for you to have regular tests and exams. Things you can do on your own to prevent diseases and keep yourself healthy. What should I know about diet, weight, and exercise? Eat a healthy diet  Eat a diet that includes plenty of vegetables, fruits, low-fat dairy products, and lean protein. Do not eat a lot of foods that are high in solid fats, added sugars, or sodium. Maintain a healthy weight Body mass  index (BMI) is used to identify weight problems. It estimates body fat based on height and weight. Your health care provider can help determine your BMI and help you achieve or maintain a healthy weight. Get regular exercise Get regular exercise. This is one of the most important things you can do for your health. Most adults should: Exercise for at least 150 minutes each week. The exercise should increase your heart rate and make you sweat (moderate-intensity exercise). Do strengthening exercises at least twice a week. This is in addition to the moderate-intensity exercise. Spend less time sitting. Even light physical activity can be beneficial. Watch cholesterol and blood lipids Have your blood tested for lipids and cholesterol at 45 years of age, then have this test every 5 years. Have your cholesterol levels checked more often if: Your lipid or cholesterol levels are high. You are older than 45 years of age. You are at high risk for heart disease. What should I know about cancer screening? Depending on your health history and family history, you may need to have cancer screening at various ages. This may include screening for: Breast cancer. Cervical cancer. Colorectal cancer. Skin cancer. Lung cancer. What should I know about heart disease, diabetes, and high blood pressure? Blood pressure and heart disease High blood pressure causes heart disease and increases the risk of stroke. This is more likely to develop in people who have high blood pressure readings, are of African descent, or are overweight. Have your blood pressure checked: Every 3-5 years if you are 17-73 years of age. Every year if you are 64 years old or older. Diabetes Have regular diabetes screenings. This checks your fasting blood sugar level. Have the screening  done: Once every three years after age 74 if you are at a normal weight and have a low risk for diabetes. More often and at a younger age if you are  overweight or have a high risk for diabetes. What should I know about preventing infection? Hepatitis B If you have a higher risk for hepatitis B, you should be screened for this virus. Talk with your health care provider to find out if you are at risk for hepatitis B infection. Hepatitis C Testing is recommended for: Everyone born from [birth year] through 1965. Anyone with known risk factors for hepatitis C. Sexually transmitted infections (STIs) Get screened for STIs, including gonorrhea and chlamydia, if: You are sexually active and are younger than 45 years of age. You are older than 45 years of age and your health care provider tells you that you are at risk for this type of infection. Your sexual activity has changed since you were last screened, and you are at increased risk for chlamydia or gonorrhea. Ask your health care provider if you are at risk. Ask your health care provider about whether you are at high risk for HIV. Your health care provider may recommend a prescription medicine to help prevent HIV infection. If you choose to take medicine to prevent HIV, you should first get tested for HIV. You should then be tested every 3 months for as long as you are taking the medicine. Pregnancy If you are about to stop having your period (premenopausal) and you may become pregnant, seek counseling before you get pregnant. Take 400 to 800 micrograms (mcg) of folic acid every day if you become pregnant. Ask for birth control (contraception) if you want to prevent pregnancy. Osteoporosis and menopause Osteoporosis is a disease in which the bones lose minerals and strength with aging. This can result in bone fractures. If you are 67 years old or older, or if you are at risk for osteoporosis and fractures, ask your health care provider if you should: Be screened for bone loss. Take a calcium or vitamin D supplement to lower your risk of fractures. Be given hormone replacement therapy (HRT) to treat  symptoms of menopause. Follow these instructions at home: Lifestyle Do not use any products that contain nicotine or tobacco, such as cigarettes, e-cigarettes, and chewing tobacco. If you need help quitting, ask your health care provider. Do not use street drugs. Do not share needles. Ask your health care provider for help if you need support or information about quitting drugs. Alcohol use Do not drink alcohol if: Your health care provider tells you not to drink. You are pregnant, may be pregnant, or are planning to become pregnant. If you drink alcohol: Limit how much you use to 0-1 drink a day. Limit intake if you are breastfeeding. Be aware of how much alcohol is in your drink. In the [place] one drink equals one 12 oz bottle of beer (355 mL), one 5 oz glass of wine (148 mL), or one 1 oz glass of hard liquor (44 mL). General instructions Schedule regular health, dental, and eye exams. Stay current with your vaccines. Tell your health care provider if: You often feel depressed. You have ever been abused or do not feel safe at home. Summary Adopting a healthy lifestyle and getting preventive care are important in promoting health and wellness. Follow your health care provider's instructions about healthy diet, exercising, and getting tested or screened for diseases. Follow your health care provider's instructions on monitoring your cholesterol and  blood pressure. This information is not intended to replace advice given to you by your health care provider. Make sure you discuss any questions you have with your health care provider. Document Revised: 02/26/2020 Document Reviewed: 12/11/2017 Elsevier Patient Education  2022 [place], MD [facility]

## 2020-10-04 ENCOUNTER — Encounter: Payer: Self-pay | Admitting: Podiatry

## 2020-10-04 ENCOUNTER — Ambulatory Visit (INDEPENDENT_AMBULATORY_CARE_PROVIDER_SITE_OTHER): Payer: Medicare Other

## 2020-10-04 ENCOUNTER — Other Ambulatory Visit: Payer: Self-pay

## 2020-10-04 ENCOUNTER — Ambulatory Visit: Payer: Medicare Other | Admitting: Podiatry

## 2020-10-04 DIAGNOSIS — M7751 Other enthesopathy of right foot: Secondary | ICD-10-CM

## 2020-10-04 DIAGNOSIS — M7752 Other enthesopathy of left foot: Secondary | ICD-10-CM

## 2020-10-04 MED ORDER — DEXAMETHASONE SODIUM PHOSPHATE 120 MG/30ML IJ SOLN
4.0000 mg | Freq: Once | INTRAMUSCULAR | Status: AC
  Administered 2020-10-04: 4 mg via INTRA_ARTICULAR

## 2020-10-05 NOTE — Progress Notes (Signed)
She presents today chief concern of painful fifth digit right foot.  States that both of her fifth toes but particularly the right been bothering her for quite some time now.  Objective: Vital signs are stable alert oriented x3 she has some mild soft tissue swelling around the PIPJ fifth digits bilateral no erythema cellulitis drainage or odor fluctuance is palpable fifth right.  Radiographs taken today demonstrate multiple arthroplasties to toes bilaterally.  She has internal fixation no fractures.  Assessment bursitis fifth digits bilateral.  Plan: Injected 2 mg of dexamethasone into each toe.  Should this not alleviate her symptoms she will notify us immediately.  Otherwise as needed.

## 2020-10-06 NOTE — Progress Notes (Signed)
[facility] [address] [place] Phone: [phone] Subjective:   IVilma Meckel, am serving as a scribe for Dr. Hulan Saas. This visit occurred during the SARS-CoV-2 public health emergency.  Safety protocols were in place, including screening questions prior to the visit, additional usage of staff PPE, and extensive cleaning of exam room while observing appropriate contact time as indicated for disinfecting solutions.   I'm seeing this patient by the request  of:  Sagardia, Ines Bloomer, MD  CC: Neck and back pain follow-up  [id number]  YAELIS SCHARFENBERG is a 45 y.o. female coming in with complaint of back and neck pain. OMT 07/27/2020. Patient states back and neck pain remain the same. Right wrist is in pain. Would like a possible injection  Medications patient has been prescribed: None           Reviewed prior external information including notes and imaging from previsou exam, outside providers and external EMR if available.   As well as notes that were available from care everywhere and other healthcare systems.  Past medical history, social, surgical and family history all reviewed in electronic medical record.  No pertanent information unless stated regarding to the chief complaint.   Past Medical History:  Diagnosis Date   Anxiety    Arthritis    Carpal tunnel syndrome    Depression    GERD (gastroesophageal reflux disease)    Hammer toe 04/2013   bilateral 4th and 5th    Ingrown left big toenail 04/2013   Mental disorder    Neuromuscular disorder ([place])    Post term pregnancy over 40 weeks 09/21/2015   SVD (spontaneous vaginal delivery) 09/22/2015   UTI (lower urinary tract infection)     Allergies  Allergen Reactions   Bactrim [Sulfamethoxazole-Trimethoprim]      Review of Systems:  No headache, visual changes, nausea, vomiting, diarrhea, constipation, dizziness, abdominal pain, skin rash, fevers,  chills, night sweats, weight loss, swollen lymph nodes,, joint swelling, chest pain, shortness of breath, mood changes. POSITIVE muscle aches, body aches  Objective  Blood pressure 124/82, pulse 98, height 5\' 1"  (1.549 m), weight 155 lb (70.3 kg), SpO2 98 %.   General: No apparent distress alert and oriented x3 mood and affect normal, dressed appropriately.  HEENT: Pupils equal, extraocular movements intact  Respiratory: Patient's speak in full sentences and does not appear short of breath  Cardiovascular: No lower extremity edema, non tender, no erythema  Right wrist exam shows the patient does have a positive Finkelstein.  Negative Tinel sign.  Good grip strength noted.  Neck exam does have some loss of lordosis.  Some tenderness to palpation in the right side of the parascapular region. Osteopathic findings  C2 flexed rotated and side bent right C6 flexed rotated and side bent right T3 extended rotated and side bent right inhaled rib L2 flexed rotated and side bent right Sacrum right on right  Procedure: Real-time Ultrasound Guided Injection of right Abductor pollicis longs tendon sheath Device: GE Logiq E  Ultrasound guided injection is preferred based studies that show increased duration, increased effect, greater accuracy, decreased procedural pain, increased response rate with ultrasound guided versus blind injection.  Verbal informed consent obtained.  Time-out conducted.  Noted no overlying erythema, induration, or other signs of local infection.  Skin prepped in a sterile fashion.  Local anesthesia: Topical Ethyl chloride.  With sterile technique and under real time ultrasound guidance:  tendon visualized.  23g 5/8  inch needle inserted distal to proximal approach into tendon sheath. Pictures taken  for needle placement. Patient did have injection of 0.5 cc of 0.5% Marcaine, and 0.5 cc of Kenalog 40 mg/dL. Completed without difficulty  Pain immediately resolved suggesting  accurate placement of the medication.  Advised to call if fevers/chills, erythema, induration, drainage, or persistent bleeding.  Impression: Technically successful ultrasound guided injection. Assessment and Plan: Harriet Pho disease (radial styloid tenosynovitis) Repeat injection given today.  Tolerated the procedure well.  Discussed icing regimen and home exercises.  Discussed which activities to doing which wants to avoid.  He is activity slowly.  Follow-up again in 6 to 8 weeks.  Cervical radiculopathy Patient is doing really well with conservative therapy.  We discussed home exercises and icing regimen.  Discussed which activities to do which wants to avoid.  Feels that the.  Manipulation has been the most beneficial.    Nonallopathic problems  Decision today to treat with OMT was based on Physical Exam  After verbal consent patient was treated with HVLA, ME, FPR techniques in cervical, rib, thoracic, lumbar, and sacral  areas  Patient tolerated the procedure well with improvement in symptoms  Patient given exercises, stretches and lifestyle modifications  See medications in patient instructions if given  Patient will follow up in 6 weeks      The above documentation has been reviewed and is accurate and complete Lyndal Pulley, DO        Note: This dictation was prepared with Dragon dictation along with smaller phrase technology. Any transcriptional errors that result from this process are unintentional.

## 2020-10-07 ENCOUNTER — Encounter: Payer: Self-pay | Admitting: Family Medicine

## 2020-10-07 ENCOUNTER — Ambulatory Visit (INDEPENDENT_AMBULATORY_CARE_PROVIDER_SITE_OTHER): Payer: Medicare Other | Admitting: Family Medicine

## 2020-10-07 ENCOUNTER — Other Ambulatory Visit: Payer: Self-pay

## 2020-10-07 ENCOUNTER — Ambulatory Visit: Payer: Self-pay

## 2020-10-07 VITALS — BP 124/82 | HR 98 | Ht 61.0 in | Wt 155.0 lb

## 2020-10-07 DIAGNOSIS — M9902 Segmental and somatic dysfunction of thoracic region: Secondary | ICD-10-CM

## 2020-10-07 DIAGNOSIS — M9903 Segmental and somatic dysfunction of lumbar region: Secondary | ICD-10-CM | POA: Diagnosis not present

## 2020-10-07 DIAGNOSIS — M25531 Pain in right wrist: Secondary | ICD-10-CM

## 2020-10-07 DIAGNOSIS — M9901 Segmental and somatic dysfunction of cervical region: Secondary | ICD-10-CM

## 2020-10-07 DIAGNOSIS — M9904 Segmental and somatic dysfunction of sacral region: Secondary | ICD-10-CM

## 2020-10-07 DIAGNOSIS — M654 Radial styloid tenosynovitis [de Quervain]: Secondary | ICD-10-CM

## 2020-10-07 DIAGNOSIS — M5412 Radiculopathy, cervical region: Secondary | ICD-10-CM | POA: Diagnosis not present

## 2020-10-07 DIAGNOSIS — M9908 Segmental and somatic dysfunction of rib cage: Secondary | ICD-10-CM | POA: Diagnosis not present

## 2020-10-07 NOTE — Patient Instructions (Signed)
Injection today See you again in 6 weeks

## 2020-10-07 NOTE — Assessment & Plan Note (Signed)
Patient is doing really well with conservative therapy.  We discussed home exercises and icing regimen.  Discussed which activities to do which wants to avoid.  Feels that the.  Manipulation has been the most beneficial.

## 2020-10-07 NOTE — Assessment & Plan Note (Signed)
Repeat injection given today.  Tolerated the procedure well.  Discussed icing regimen and home exercises.  Discussed which activities to doing which wants to avoid.  He is activity slowly.  Follow-up again in 6 to 8 weeks.

## 2020-10-20 DIAGNOSIS — M79644 Pain in right finger(s): Secondary | ICD-10-CM | POA: Diagnosis not present

## 2020-10-20 DIAGNOSIS — G5601 Carpal tunnel syndrome, right upper limb: Secondary | ICD-10-CM | POA: Diagnosis not present

## 2020-10-20 DIAGNOSIS — M1811 Unilateral primary osteoarthritis of first carpometacarpal joint, right hand: Secondary | ICD-10-CM | POA: Diagnosis not present

## 2020-10-22 ENCOUNTER — Other Ambulatory Visit: Payer: Self-pay | Admitting: Family Medicine

## 2020-11-01 DIAGNOSIS — R3129 Other microscopic hematuria: Secondary | ICD-10-CM | POA: Diagnosis not present

## 2020-11-01 DIAGNOSIS — Z1231 Encounter for screening mammogram for malignant neoplasm of breast: Secondary | ICD-10-CM | POA: Diagnosis not present

## 2020-11-01 DIAGNOSIS — Z130 Encounter for screening for diseases of the blood and blood-forming organs and certain disorders involving the immune mechanism: Secondary | ICD-10-CM | POA: Diagnosis not present

## 2020-11-18 ENCOUNTER — Ambulatory Visit: Payer: Medicare Other | Admitting: Family Medicine

## 2020-11-29 ENCOUNTER — Other Ambulatory Visit: Payer: Self-pay | Admitting: Family Medicine

## 2020-11-29 ENCOUNTER — Ambulatory Visit: Payer: Medicare Other | Admitting: Gastroenterology

## 2020-11-29 ENCOUNTER — Encounter: Payer: Self-pay | Admitting: Gastroenterology

## 2020-11-29 VITALS — BP 112/84 | HR 97 | Ht 61.0 in | Wt 158.4 lb

## 2020-11-29 DIAGNOSIS — Z8619 Personal history of other infectious and parasitic diseases: Secondary | ICD-10-CM

## 2020-11-29 DIAGNOSIS — R1310 Dysphagia, unspecified: Secondary | ICD-10-CM | POA: Diagnosis not present

## 2020-11-29 DIAGNOSIS — K219 Gastro-esophageal reflux disease without esophagitis: Secondary | ICD-10-CM

## 2020-11-29 MED ORDER — LANSOPRAZOLE 30 MG PO TBDD: 30.0000 mg | DELAYED_RELEASE_TABLET | Freq: Two times a day (BID) | ORAL | 3 refills | Status: DC

## 2020-11-29 NOTE — Patient Instructions (Signed)
Your provider has requested that you go to the basement level for lab work before leaving today. Press "B" on the elevator. The lab is located at the first door on the left as you exit the elevator.  Prior to completing H. Pylori stool study will need to stop Prevacid for 14 days, may use Pepcid 20 mg daily up until 24 hours before completing the test.   We have sent the following medications to your pharmacy for you to pick up at your convenience: Prevacid 30 mg solutab twice daily.   You have been scheduled for a Barium Esophogram at [facility] (1st floor of the hospital) on Thursday 12/01/20 at 10 am. Please arrive 15 minutes prior to your appointment for registration. Make certain not to have anything to eat or drink 3 hours prior to your test. If you need to reschedule for any reason, please contact radiology at [phone] to do so. __________________________________________________________________ A barium swallow is an examination that concentrates on views of the esophagus. This tends to be a double contrast exam (barium and two liquids which, when combined, create a gas to distend the wall of the oesophagus) or single contrast (non-ionic iodine based). The study is usually tailored to your symptoms so a good history is essential. Attention is paid during the study to the form, structure and configuration of the esophagus, looking for functional disorders (such as aspiration, dysphagia, achalasia, motility and reflux) EXAMINATION You may be asked to change into a gown, depending on the type of swallow being performed. A radiologist and radiographer will perform the procedure. The radiologist will advise you of the type of contrast selected for your procedure and direct you during the exam. You will be asked to stand, sit or lie in several different positions and to hold a small amount of fluid in your mouth before being asked to swallow while the imaging is performed .In some  instances you may be asked to swallow barium coated marshmallows to assess the motility of a solid food bolus. The exam can be recorded as a digital or video fluoroscopy procedure. POST PROCEDURE It will take 1-2 days for the barium to pass through your system. To facilitate this, it is important, unless otherwise directed, to increase your fluids for the next 24-48hrs and to resume your normal diet.  This test typically takes about 30 minutes to perform. ___________________________________________________________________  Melissa [name] have been scheduled for an esophageal manometry test at [facility] on Friday 02/10/21 at 8:30 am. Please arrive 30 minutes prior to your procedure for registration. You will need to go to outpatient registration (1st floor of the hospital) first. Make certain to bring your insurance cards as well as a complete list of medications.  Please remember the following:  1) Do not take any muscle relaxants, xanax (alprazolam) or ativan for 1 day prior to your test as well as the day of the test.  2) Nothing to eat or drink after 12:00 midnight on the night before your test.  3) Hold all diabetic medications/insulin the morning of the test. You may eat and take your medications after the test.  It will take at least 2 weeks to receive the results of this test from your physician.  ------------------------------------------ ABOUT ESOPHAGEAL MANOMETRY Esophageal manometry (muh-NOM-uh-tree) is a test that gauges how well your esophagus works. Your esophagus is the long, muscular tube that connects your throat to your stomach. Esophageal manometry measures the rhythmic muscle contractions (peristalsis) that occur in your esophagus  when you swallow. Esophageal manometry also measures the coordination and force exerted by the muscles of your esophagus.  During esophageal manometry, a thin, flexible tube (catheter) that contains sensors is passed through your nose, down your  esophagus and into your stomach. Esophageal manometry can be helpful in diagnosing some mostly uncommon disorders that affect your esophagus.  Why it's done Esophageal manometry is used to evaluate the movement (motility) of food through the esophagus and into the stomach. The test measures how well the circular bands of muscle (sphincters) at the top and bottom of your esophagus open and close, as well as the pressure, strength and pattern of the wave of esophageal muscle contractions that moves food along.  What you can expect Esophageal manometry is an outpatient procedure done without sedation. Most people tolerate it well. You may be asked to change into a hospital gown before the test starts.  During esophageal manometry  While you are sitting up, a member of your health care team sprays your throat with a numbing medication or puts numbing gel in your nose or both.  A catheter is guided through your nose into your esophagus. The catheter may be sheathed in a water-filled sleeve. It doesn't interfere with your breathing. However, your eyes may water, and you may gag. You may have a slight nosebleed from irritation.  After the catheter is in place, you may be asked to lie on your back on an exam table, or you may be asked to remain seated.  You then swallow small sips of water. As you do, a computer connected to the catheter records the pressure, strength and pattern of your esophageal muscle contractions.  During the test, you'll be asked to breathe slowly and smoothly, remain as still as possible, and swallow only when you're asked to do so.  A member of your health care team may move the catheter down into your stomach while the catheter continues its measurements.  The catheter then is slowly withdrawn. The test usually lasts 20 to 30 minutes.  After esophageal manometry  When your esophageal manometry is complete, you may return to your normal activities  This test typically takes 30-45  minutes to complete. ___________________________________________________________________

## 2020-11-29 NOTE — Progress Notes (Signed)
11/29/2020 Melissa [name] [id number] 1975-02-28   HISTORY OF PRESENT ILLNESS: This is a 45 year old female who is a patient of Dr. Woodward Ku.  She was seen by her for complaints of dysphagia in March 2021.  EGD was scheduled and performed as follows:  EGD 04/2019:  - Z-line regular, 35 cm from the incisors. - No endoscopic esophageal abnormality to explain patient's dysphagia. Esophagus dilated. Dilated. Biopsied. - Gastroesophageal flap valve classified as Hill Grade II (fold present, opens with respiration). - Non-bleeding gastric ulcers with no stigmata of bleeding. Biopsied. - Normal examined duodenum.  1. Surgical [P], gastric antrum and gastric body - CHRONIC ACTIVE GASTRITIS - H. PYLORI ORGANISMS PRESENT - NO INTESTINAL METAPLASIA IDENTIFIED - SEE COMMENT 2. Surgical [P], distal esophagus - BENIGN SQUAMOUS MUCOSA - NO INCREASED INTRAEPITHELIAL EOSINOPHILS 3. Surgical [P], proximal esophagus - BENIGN SQUAMOUS MUCOSA - NO INCREASED INTRAEPITHELIAL EOSINOPHILS  She reports having no improvement in her symptoms after the EGD with dilation.  She continues on her Prevacid SoluTab 30 mg twice daily and feels like that her acid reflux is well controlled.  She says that this dysphagia occurs most of the time.  She says that she takes precaution when eating and avoids certain foods, etc.  Past Medical History:  Diagnosis Date   Anxiety    Arthritis    Carpal tunnel syndrome    Depression    GERD (gastroesophageal reflux disease)    Hammer toe 04/2013   bilateral 4th and 5th    Ingrown left big toenail 04/2013   Mental disorder    Neuromuscular disorder ([place])    Post term pregnancy over 40 weeks 09/21/2015   SVD (spontaneous vaginal delivery) 09/22/2015   UTI (lower urinary tract infection)    Past Surgical History:  Procedure Laterality Date   BREAST LIFT Bilateral    CARPAL TUNNEL RELEASE     CORRECTION HAMMER TOE Bilateral    CRYOABLATION  2004   CYSTOSCOPY  WITH HYDRODISTENSION AND BIOPSY  07/28/2004   with urethral dilatation   DILATION AND CURETTAGE OF UTERUS     DILATION AND EVACUATION  12/21/2011   Procedure: DILATATION AND EVACUATION;  Surgeon: Terrance Mass, MD;  Location: [place];  Service: Gynecology;  Laterality: N/A;  first trimester    HAMMER TOE SURGERY Bilateral 04/20/2013   Procedure: BILATERAL HAMMER TOE REPAIR AND EXCISION NAIL PERMANENT LEFT FIRST;  Surgeon: Harriet Masson, DPM;  Location: [place];  Service: Podiatry;  Laterality: Bilateral;  Hammer toe repair fourth and fifth toes bilateral feet with screw placement fourth toes and partial nail excision left great toe   HYSTEROSALPINGOGRAM     REDUCTION MAMMAPLASTY Bilateral 2020   TOE SURGERY     ingrown toenail   WISDOM TOOTH EXTRACTION      reports that she has never smoked. She has never used smokeless tobacco. She reports current alcohol use. She reports that she does not use drugs. family history includes Arthritis in her mother; Heart disease in her paternal grandmother; Lung cancer in her father and paternal grandfather; Seizures in her mother; Thyroid disease in her maternal grandmother. Allergies  Allergen Reactions   Bactrim [Sulfamethoxazole-Trimethoprim]       Outpatient Encounter Medications as of 11/29/2020  Medication Sig   ammonium lactate (LAC-HYDRIN) 12 % lotion Apply 1 application topically as needed for dry skin.   amphetamine-dextroamphetamine (ADDERALL) 20 MG tablet TAKE 1 TABLET BY MOUTH IN THE MORNING AND 1 EVERYDAY AT NOON  AND 1 TABLET AT 4PM   azelastine (ASTELIN) 0.1 % nasal spray Place 2 sprays into both nostrils 2 (two) times daily. Use in each nostril as directed   escitalopram (LEXAPRO) 20 MG tablet    Fluocinolone Acetonide Body 0.01 % OIL fluocinolone 0.01 % scalp oil and shower cap  APPLY TO SCALP AT BEDTIME,MAY WASH UPON AWAKENING   fluticasone (FLONASE) 50 MCG/ACT nasal spray Use 2 spray(s) in  each nostril once daily   ibuprofen (ADVIL) 800 MG tablet Take 1 tablet by mouth twice daily   lansoprazole (PREVACID SOLUTAB) 30 MG disintegrating tablet DISSOLVE 1 TABLET IN MOUTH IN THE MORNING AND AT BEDTIME   naproxen (NAPROSYN) 500 MG tablet Take 1 tablet (500 mg total) by mouth 2 (two) times daily as needed.   ondansetron (ZOFRAN) 4 MG tablet Take 1 tablet (4 mg total) by mouth every 8 (eight) hours as needed.   Salicylic Acid 3 % SHAM Use daily as directed for dry scalp   selenium sulfide (SELSUN) 2.5 % shampoo USE LOTION EXTERNALLY  TO AFFECTED AREA ONCE DAILY AS NEEDED IRRITATION   SRONYX 0.1-20 MG-MCG tablet Take 1 tablet by mouth daily.   tranexamic acid (LYSTEDA) 650 MG TABS tablet Take 650 mg by mouth daily.   venlafaxine XR (EFFEXOR-XR) 37.5 MG 24 hr capsule TAKE 1 CAPSULE BY MOUTH DAILY WITH BREAKFAST.   Vitamin D, Ergocalciferol, (DRISDOL) 1.25 MG (50000 UNIT) CAPS capsule Take 1 capsule (50,000 Units total) by mouth every 7 (seven) days.   XIIDRA 5 % SOLN INSTILL 1 DROP TWICE DAILY INTO EACH EYE   [DISCONTINUED] venlafaxine XR (EFFEXOR-XR) 37.5 MG 24 hr capsule TAKE 1 CAPSULE BY MOUTH DAILY WITH BREAKFAST.   No facility-administered encounter medications on file as of 11/29/2020.     REVIEW OF SYSTEMS  : All other systems reviewed and negative except where noted in the History of Present Illness.   PHYSICAL EXAM: Ht 5\' 1"  (1.549 m)   Wt 158 lb 6.4 oz (71.8 kg)   BMI 29.93 kg/m  General: Well developed female in no acute distress Head: Normocephalic and atraumatic Eyes:  Sclerae anicteric, conjunctiva pink. Ears: Normal auditory acuity Lungs: Clear throughout to auscultation; no W/R/R. Heart: Regular rate and rhythm; no M/R/G. Abdomen: Soft, non-distended.  BS present.  Non-tender. Musculoskeletal: Symmetrical with no gross deformities  Skin: No lesions on visible extremities Extremities: No edema  Neurological: Alert oriented x 4, grossly  non-focal Psychological:  Alert and cooperative. Normal mood and affect  ASSESSMENT AND PLAN: *Dysphagia: EGD in 2021 did not show any endoscopic cause of her dysphagia, but esophagus was empirically dilated.  No improvement in her symptoms, however.  We will plan for esophageal manometry, but since those are not being performed until February or March since they have been put on hold, we will order an esophagram in the interim to reassess for any structural issues while we wait. *GERD: Well-controlled on Prevacid 30 mg SoluTab twice daily.  New prescription with refill sent to her pharmacy *History of H. Pylori: Was treated, but never had follow-up stool testing performed.  We will order H. pylori stool antigen.  She was advised that she needs to be off of her PPI for 2 weeks prior to performing the test   CC:  [place], [place], *

## 2020-12-01 ENCOUNTER — Ambulatory Visit (HOSPITAL_COMMUNITY): Payer: Medicare Other

## 2020-12-03 ENCOUNTER — Other Ambulatory Visit: Payer: Self-pay | Admitting: Family Medicine

## 2020-12-07 ENCOUNTER — Ambulatory Visit (INDEPENDENT_AMBULATORY_CARE_PROVIDER_SITE_OTHER): Payer: Medicare Other | Admitting: Nurse Practitioner

## 2020-12-07 ENCOUNTER — Encounter: Payer: Self-pay | Admitting: Nurse Practitioner

## 2020-12-07 VITALS — Temp 97.5°F

## 2020-12-07 DIAGNOSIS — R051 Acute cough: Secondary | ICD-10-CM | POA: Diagnosis not present

## 2020-12-07 DIAGNOSIS — J069 Acute upper respiratory infection, unspecified: Secondary | ICD-10-CM | POA: Insufficient documentation

## 2020-12-07 MED ORDER — BENZONATATE 200 MG PO CAPS: 200.0000 mg | ORAL_CAPSULE | Freq: Three times a day (TID) | ORAL | 0 refills | Status: AC | PRN

## 2020-12-07 MED ORDER — LEVOCETIRIZINE DIHYDROCHLORIDE 5 MG PO TABS: 5.0000 mg | ORAL_TABLET | Freq: Every evening | ORAL | 0 refills | Status: DC

## 2020-12-07 NOTE — Progress Notes (Signed)
Patient ID: Melissa [name], female    DOB: 08/10/1975, 45 y.o.   MRN: [id number]  Virtual visit completed through [employer], a video enabled telemedicine application. Due to national recommendations of social distancing due to COVID-19, a virtual visit is felt to be most appropriate for this patient at this time. Reviewed limitations, risks, security and privacy concerns of performing a virtual visit and the availability of in person appointments. I also reviewed that there may be a patient responsible charge related to this service. The patient agreed to proceed.  Attempted to connect via video enabled device but was unsuccessful  Phone call was 8 mins and 31 seconds   Patient location: home Provider location: [place] [facility], office Persons participating in this virtual visit: patient, provider   If any vitals were documented, they were collected by patient at home unless specified below.    Temp (!) 97.5 F (36.4 C) Comment: per patient   CC: cough Subjective:   HPI: Melissa [name] is a 45 y.o. female presenting on 12/07/2020 for Cough (Sx started on 12/04/20. Worse at night, itchy throat, runny nose, stuffy nose, post nasal drip, headache, eyes watery. No fever or body aches. Covid test negative on 12/4 and 12/07/20. Has been taking OTC tylenol and cold medications.)  Symptoms started on 12/04/2020 Covid test was negative on 12/04/2020 and on 12/07/2020. Pfizer x 2 and booster Mother is a sick contact, with a terrible cold Tylenol and OTC mucinex with no relief    Relevant past medical, surgical, family and social history reviewed and updated as indicated. Interim medical history since our last visit reviewed. Allergies and medications reviewed and updated. Outpatient Medications Prior to Visit  Medication Sig Dispense Refill   ammonium lactate (LAC-HYDRIN) 12 % lotion Apply 1 application topically as needed for dry skin. 400 g 5   amphetamine-dextroamphetamine  (ADDERALL) 20 MG tablet TAKE 1 TABLET BY MOUTH IN THE MORNING AND 1 EVERYDAY AT NOON AND 1 TABLET AT 4PM  0   azelastine (ASTELIN) 0.1 % nasal spray Place 2 sprays into both nostrils 2 (two) times daily. Use in each nostril as directed 30 mL 12   buPROPion (ZYBAN) 150 MG 12 hr tablet Take 450 mg by mouth daily.     escitalopram (LEXAPRO) 20 MG tablet      Fluocinolone Acetonide Body 0.01 % OIL fluocinolone 0.01 % scalp oil and shower cap  APPLY TO SCALP AT BEDTIME,MAY WASH UPON AWAKENING     fluticasone (FLONASE) 50 MCG/ACT nasal spray Use 2 spray(s) in each nostril once daily 16 g 11   ibuprofen (ADVIL) 800 MG tablet Take 1 tablet by mouth twice daily 180 tablet 0   lansoprazole (PREVACID SOLUTAB) 30 MG disintegrating tablet Take 1 tablet (30 mg total) by mouth 2 (two) times daily. 180 tablet 3   naproxen (NAPROSYN) 500 MG tablet Take 1 tablet (500 mg total) by mouth 2 (two) times daily as needed. 180 tablet 1   ondansetron (ZOFRAN) 4 MG tablet Take 1 tablet (4 mg total) by mouth every 8 (eight) hours as needed. 20 tablet 0   Salicylic Acid 3 % SHAM Use daily as directed for dry scalp 236 mL 1   selenium sulfide (SELSUN) 2.5 % shampoo USE LOTION EXTERNALLY  TO AFFECTED AREA ONCE DAILY AS NEEDED IRRITATION 120 mL 0   XIIDRA 5 % SOLN INSTILL 1 DROP TWICE DAILY INTO EACH EYE     SRONYX 0.1-20 MG-MCG tablet Take 1 tablet  by mouth daily.     tranexamic acid (LYSTEDA) 650 MG TABS tablet Take 650 mg by mouth daily.     venlafaxine XR (EFFEXOR-XR) 37.5 MG 24 hr capsule TAKE 1 CAPSULE BY MOUTH DAILY WITH BREAKFAST. 30 capsule 0   Vitamin D, Ergocalciferol, (DRISDOL) 1.25 MG (50000 UNIT) CAPS capsule Take 1 capsule (50,000 Units total) by mouth every 7 (seven) days. 12 capsule 0   No facility-administered medications prior to visit.     Per HPI unless specifically indicated in ROS section below Review of Systems  Constitutional:  Positive for fatigue. Negative for chills and fever.  HENT:  Positive  for congestion, postnasal drip and sore throat (at night and in the morning. Ithcing and tingling). Negative for ear discharge, ear pain, sinus pressure and sinus pain.   Respiratory:  Positive for cough (dry). Negative for shortness of breath.   Cardiovascular:  Negative for chest pain.  Gastrointestinal:  Negative for abdominal pain, diarrhea, nausea and vomiting.  Musculoskeletal:  Negative for arthralgias and myalgias.  Neurological:  Negative for headaches.  Objective:  Temp (!) 97.5 F (36.4 C) Comment: per patient  Wt Readings from Last 3 Encounters:  11/29/20 158 lb 6.4 oz (71.8 kg)  10/07/20 155 lb (70.3 kg)  09/20/20 153 lb (69.4 kg)       Physical exam: Gen: alert, NAD, not ill appearing Pulm: speaks in complete sentences without increased work of breathing Psych: normal mood, normal thought content      Results for orders placed or performed in visit on 03/11/20  Comp Met (CMET)  Result Value Ref Range   Sodium 137 135 - 145 mEq/L   Potassium 3.6 3.5 - 5.1 mEq/L   Chloride 102 96 - 112 mEq/L   CO2 28 19 - 32 mEq/L   Glucose, Bld 85 70 - 99 mg/dL   BUN 12 6 - 23 mg/dL   Creatinine, Ser 0.59 0.40 - 1.20 mg/dL   Total Bilirubin 0.5 0.2 - 1.2 mg/dL   Alkaline Phosphatase 53 39 - 117 U/L   AST 17 0 - 37 U/L   ALT 17 0 - 35 U/L   Total Protein 7.7 6.0 - 8.3 g/dL   Albumin 4.4 3.5 - 5.2 g/dL   GFR 109.69 >60.00 mL/min   Calcium 9.3 8.4 - 10.5 mg/dL  CBC w/Diff  Result Value Ref Range   WBC 8.4 4.0 - 10.5 K/uL   RBC 4.48 3.87 - 5.11 Mil/uL   Hemoglobin 14.0 12.0 - 15.0 g/dL   HCT 39.9 36.0 - 46.0 %   MCV 89.1 78.0 - 100.0 fl   MCHC 35.0 30.0 - 36.0 g/dL   RDW 12.8 11.5 - 15.5 %   Platelets 304.0 150.0 - 400.0 K/uL   Neutrophils Relative % 65.4 43.0 - 77.0 %   Lymphocytes Relative 27.6 12.0 - 46.0 %   Monocytes Relative 5.2 3.0 - 12.0 %   Eosinophils Relative 0.8 0.0 - 5.0 %   Basophils Relative 1.0 0.0 - 3.0 %   Neutro Abs 5.5 1.4 - 7.7 K/uL   Lymphs Abs  2.3 0.7 - 4.0 K/uL   Monocytes Absolute 0.4 0.1 - 1.0 K/uL   Eosinophils Absolute 0.1 0.0 - 0.7 K/uL   Basophils Absolute 0.1 0.0 - 0.1 K/uL   Assessment & Plan:   Problem List Items Addressed This Visit       Respiratory   Viral upper respiratory tract infection - Primary    Patient symptoms sound most likely  viral in nature.  Can continue symptomatic over-the-counter treatment.  Continue to monitor if symptoms fail to improve or get worse she will reach back out to the clinic.      Relevant Medications   levocetirizine (XYZAL) 5 MG tablet     Other   Acute cough    Patient states the cough is troublesome we will send in some Tessalon Perles to help with cough.  Follow-up if symptoms fail to improve      Relevant Medications   benzonatate (TESSALON) 200 MG capsule     No orders of the defined types were placed in this encounter.  No orders of the defined types were placed in this encounter.   I discussed the assessment and treatment plan with the patient. The patient was provided an opportunity to ask questions and all were answered. The patient agreed with the plan and demonstrated an understanding of the instructions. The patient was advised to call back or seek an in-person evaluation if the symptoms worsen or if the condition fails to improve as anticipated.  Follow up plan: No follow-ups on file.  Romilda Garret, NP

## 2020-12-07 NOTE — Assessment & Plan Note (Signed)
Patient states the cough is troublesome we will send in some Tessalon Perles to help with cough.  Follow-up if symptoms fail to improve

## 2020-12-07 NOTE — Assessment & Plan Note (Signed)
Patient symptoms sound most likely viral in nature.  Can continue symptomatic over-the-counter treatment.  Continue to monitor if symptoms fail to improve or get worse she will reach back out to the clinic.

## 2020-12-20 NOTE — Progress Notes (Signed)
[facility] Phone: [phone] Subjective:   Melissa [name], am serving as a scribe for Dr. Hulan Saas. This visit occurred during the SARS-CoV-2 public health emergency.  Safety protocols were in place, including screening questions prior to the visit, additional usage of staff PPE, and extensive cleaning of exam room while observing appropriate contact time as indicated for disinfecting solutions.  I'm seeing this patient by the request  of:  [name], Melissa Bloomer, MD  CC: Neck and back pain follow-up  [id number]  Melissa [name] is a 45 y.o. female coming in with complaint of back and neck pain. OMT 10/07/2020. R wrist injection given last visit for DeQuervains. Patient states that sciatic nerve is painful on L side. Started 3 weeks ago and radiates into L hamstring.  Patient states that the pain is worsening over the course of time.  Does not remember any true injury.  Has had things like this similar.  Has responded well to manipulation she thinks.  Pain in lumbar and thoracic spine since last visit.   Medications patient has been prescribed: None  Taking:         Reviewed prior external information including notes and imaging from previsou exam, outside providers and external EMR if available.   As well as notes that were available from care everywhere and other healthcare systems.  Past medical history, social, surgical and family history all reviewed in electronic medical record.  [name] pertanent information unless stated regarding to the chief complaint.   Past Medical History:  Diagnosis Date   Anxiety    Arthritis    Carpal tunnel syndrome    Depression    GERD (gastroesophageal reflux disease)    Hammer toe 04/2013   bilateral 4th and 5th    Ingrown left big toenail 04/2013   Mental disorder    Neuromuscular disorder ([place])    Post term pregnancy over 40 weeks 09/21/2015   SVD (spontaneous  vaginal delivery) 09/22/2015   UTI (lower urinary tract infection)     Allergies  Allergen Reactions   Bactrim [Sulfamethoxazole-Trimethoprim]      Review of Systems:  [name] headache, visual changes, nausea, vomiting, diarrhea, constipation, dizziness, abdominal pain, skin rash, fevers, chills, night sweats, weight loss, swollen lymph nodes,  joint swelling, chest pain, shortness of breath, mood changes. POSITIVE muscle aches, body aches  Objective  Blood pressure 122/86, pulse (!) 101, height 5\' 1"  (1.549 m), weight 158 lb (71.7 kg), SpO2 98 %.   General: [name] apparent distress alert and oriented x3 mood and affect normal, dressed appropriately.  HEENT: Pupils equal, extraocular movements intact  Respiratory: Patient's speak in full sentences and does not appear short of breath  Cardiovascular: [name] lower extremity edema, non tender, [name] erythema  Neck exam still has some loss of lordosis.  Some tenderness to palpation of the paraspinal musculature.  Low back exam does have some more tightness than usual.  Patient has tightness of the straight leg test bilaterally worse on the right.  Patient does state that there is some radicular symptoms but [name] weakness.  Osteopathic findings  C2 flexed rotated and side bent right C6 flexed rotated and side bent left T3 extended rotated and side bent right inhaled rib T9 extended rotated and side bent left L2 flexed rotated and side bent right Sacrum right on right       Assessment and Plan:  Bilateral low back pain with right-sided sciatica Low  back exam does have some mild loss of lordosis.  Some tenderness to palpation of the paraspinal musculature.  Patient likely is only having some right-sided sciatica intermittently.  He is having exacerbation of the underlying problem.  Discussed which activities to do which wants to avoid.  Follow-up with me again in 6 to 8 weeks.   Nonallopathic problems  Decision today to treat with OMT was based on  Physical Exam  After verbal consent patient was treated with HVLA, ME, FPR techniques in cervical, rib, thoracic, lumbar, and sacral  areas  Patient tolerated the procedure well with improvement in symptoms  Patient given exercises, stretches and lifestyle modifications  See medications in patient instructions if given  Patient will follow up in 4-8 weeks      The above documentation has been reviewed and is accurate and complete Melissa Pulley, DO       Note: This dictation was prepared with Dragon dictation along with smaller phrase technology. Any transcriptional errors that result from this process are unintentional.

## 2020-12-21 ENCOUNTER — Ambulatory Visit (INDEPENDENT_AMBULATORY_CARE_PROVIDER_SITE_OTHER): Payer: Medicare Other | Admitting: Family Medicine

## 2020-12-21 ENCOUNTER — Other Ambulatory Visit: Payer: Self-pay

## 2020-12-21 ENCOUNTER — Encounter: Payer: Self-pay | Admitting: Family Medicine

## 2020-12-21 VITALS — BP 122/86 | HR 101 | Ht 61.0 in | Wt 158.0 lb

## 2020-12-21 DIAGNOSIS — M9901 Segmental and somatic dysfunction of cervical region: Secondary | ICD-10-CM | POA: Diagnosis not present

## 2020-12-21 DIAGNOSIS — M9904 Segmental and somatic dysfunction of sacral region: Secondary | ICD-10-CM | POA: Diagnosis not present

## 2020-12-21 DIAGNOSIS — M9908 Segmental and somatic dysfunction of rib cage: Secondary | ICD-10-CM

## 2020-12-21 DIAGNOSIS — M9902 Segmental and somatic dysfunction of thoracic region: Secondary | ICD-10-CM | POA: Diagnosis not present

## 2020-12-21 DIAGNOSIS — M9903 Segmental and somatic dysfunction of lumbar region: Secondary | ICD-10-CM | POA: Diagnosis not present

## 2020-12-21 DIAGNOSIS — G8929 Other chronic pain: Secondary | ICD-10-CM

## 2020-12-21 DIAGNOSIS — M5441 Lumbago with sciatica, right side: Secondary | ICD-10-CM

## 2020-12-21 MED ORDER — KETOROLAC TROMETHAMINE 30 MG/ML IJ SOLN
30.0000 mg | Freq: Once | INTRAMUSCULAR | Status: AC
  Administered 2020-12-21: 11:00:00 30 mg via INTRAMUSCULAR

## 2020-12-21 MED ORDER — METHYLPREDNISOLONE ACETATE 40 MG/ML IJ SUSP
40.0000 mg | Freq: Once | INTRAMUSCULAR | Status: AC
  Administered 2020-12-21: 11:00:00 40 mg via INTRAMUSCULAR

## 2020-12-21 NOTE — Assessment & Plan Note (Signed)
Low back exam does have some mild loss of lordosis.  Some tenderness to palpation of the paraspinal musculature.  Patient likely is only having some right-sided sciatica intermittently.  He is having exacerbation of the underlying problem.  Discussed which activities to do which wants to avoid.  Follow-up with me again in 6 to 8 weeks.

## 2020-12-21 NOTE — Patient Instructions (Signed)
See me in 6-8 weeks 

## 2021-01-10 NOTE — Progress Notes (Signed)
Reviewed and agree with documentation and assessment and plan. K. Veena Jowel Waltner , MD   

## 2021-01-31 ENCOUNTER — Other Ambulatory Visit: Payer: Self-pay | Admitting: Family Medicine

## 2021-02-02 ENCOUNTER — Encounter (HOSPITAL_COMMUNITY): Payer: Self-pay | Admitting: Gastroenterology

## 2021-02-08 ENCOUNTER — Ambulatory Visit (INDEPENDENT_AMBULATORY_CARE_PROVIDER_SITE_OTHER): Payer: Medicare Other | Admitting: Emergency Medicine

## 2021-02-08 ENCOUNTER — Other Ambulatory Visit: Payer: Self-pay

## 2021-02-08 ENCOUNTER — Encounter: Payer: Self-pay | Admitting: Emergency Medicine

## 2021-02-08 VITALS — BP 132/70 | HR 100 | Ht 61.0 in | Wt 163.0 lb

## 2021-02-08 DIAGNOSIS — Z8349 Family history of other endocrine, nutritional and metabolic diseases: Secondary | ICD-10-CM | POA: Diagnosis not present

## 2021-02-08 DIAGNOSIS — R635 Abnormal weight gain: Secondary | ICD-10-CM | POA: Diagnosis not present

## 2021-02-08 DIAGNOSIS — G5601 Carpal tunnel syndrome, right upper limb: Secondary | ICD-10-CM

## 2021-02-08 LAB — CBC WITH DIFFERENTIAL/PLATELET
Basophils Absolute: 0.1 10*3/uL (ref 0.0–0.1)
Basophils Relative: 0.8 % (ref 0.0–3.0)
Eosinophils Absolute: 0.1 10*3/uL (ref 0.0–0.7)
Eosinophils Relative: 0.9 % (ref 0.0–5.0)
HCT: 39.6 % (ref 36.0–46.0)
Hemoglobin: 13.5 g/dL (ref 12.0–15.0)
Lymphocytes Relative: 21.4 % (ref 12.0–46.0)
Lymphs Abs: 1.7 10*3/uL (ref 0.7–4.0)
MCHC: 34 g/dL (ref 30.0–36.0)
MCV: 90.2 fl (ref 78.0–100.0)
Monocytes Absolute: 0.7 10*3/uL (ref 0.1–1.0)
Monocytes Relative: 8.3 % (ref 3.0–12.0)
Neutro Abs: 5.5 10*3/uL (ref 1.4–7.7)
Neutrophils Relative %: 68.6 % (ref 43.0–77.0)
Platelets: 297 10*3/uL (ref 150.0–400.0)
RBC: 4.39 Mil/uL (ref 3.87–5.11)
RDW: 12.9 % (ref 11.5–15.5)
WBC: 8 10*3/uL (ref 4.0–10.5)

## 2021-02-08 LAB — COMPREHENSIVE METABOLIC PANEL
ALT: 21 U/L (ref 0–35)
AST: 24 U/L (ref 0–37)
Albumin: 4.2 g/dL (ref 3.5–5.2)
Alkaline Phosphatase: 58 U/L (ref 39–117)
BUN: 19 mg/dL (ref 6–23)
CO2: 29 mEq/L (ref 19–32)
Calcium: 9.3 mg/dL (ref 8.4–10.5)
Chloride: 100 mEq/L (ref 96–112)
Creatinine, Ser: 0.71 mg/dL (ref 0.40–1.20)
GFR: 102.82 mL/min (ref >60.00)
Glucose, Bld: 87 mg/dL (ref 70–99)
Potassium: 3.8 mEq/L (ref 3.5–5.1)
Sodium: 138 mEq/L (ref 135–145)
Total Bilirubin: 0.9 mg/dL (ref 0.2–1.2)
Total Protein: 7.8 g/dL (ref 6.0–8.3)

## 2021-02-08 LAB — LIPID PANEL
Cholesterol: 204 mg/dL — ABNORMAL HIGH (ref 0–200)
HDL: 59.8 mg/dL (ref >39.00)
LDL Cholesterol: 130 mg/dL — ABNORMAL HIGH (ref 0–99)
NonHDL: 143.89
Total CHOL/HDL Ratio: 3
Triglycerides: 68 mg/dL (ref 0.0–149.0)
VLDL: 13.6 mg/dL (ref 0.0–40.0)

## 2021-02-08 LAB — HEMOGLOBIN A1C: Hgb A1c MFr Bld: 5.6 % (ref 4.6–6.5)

## 2021-02-08 MED ORDER — METHYLPREDNISOLONE ACETATE 80 MG/ML IJ SUSP
80.0000 mg | Freq: Once | INTRAMUSCULAR | Status: AC
  Administered 2021-02-08: 80 mg via INTRAMUSCULAR

## 2021-02-08 NOTE — Assessment & Plan Note (Signed)
Thyroid panel with TSH done today. Clinically euthyroid.

## 2021-02-08 NOTE — Progress Notes (Signed)
Melissa [name] 46 y.o.   Chief Complaint  Patient presents with   Arm Pain    Right, x2 wks.  Pt would like a shoot. Check thyroid   Nasal Congestion    HISTORY OF PRESENT ILLNESS: This is a 46 y.o. female with history of carpal tunnel syndrome complaining of typical pain to right forearm and wrist for the past 2 weeks.  Requesting a corticosteroid shot. Also complaining of weight gain.  Has family history of thyroid disease.  Requesting to have thyroid checked. Also complaining of chronic nasal congestion with increased mucus discharge for the past several years. No other complaints or medical concerns today.  HPI   Prior to Admission medications   Medication Sig Start Date End Date Taking? Authorizing Provider  ammonium lactate (LAC-HYDRIN) 12 % lotion Apply 1 application topically as needed for dry skin. 10/15/19  Yes Hyatt, Max T, DPM  amphetamine-dextroamphetamine (ADDERALL) 20 MG tablet TAKE 1 TABLET BY MOUTH IN THE MORNING AND 1 EVERYDAY AT NOON AND 1 TABLET AT 4PM 11/11/17  Yes [provider]  azelastine (ASTELIN) 0.1 % nasal spray Place 2 sprays into both nostrils 2 (two) times daily. Use in each nostril as directed 11/05/19  Yes Marrian Salvage, FNP  buPROPion (ZYBAN) 150 MG 12 hr tablet Take 450 mg by mouth daily.   Yes [provider]  escitalopram (LEXAPRO) 20 MG tablet    Yes [provider]  Fluocinolone Acetonide Body 0.01 % OIL fluocinolone 0.01 % scalp oil and shower cap  APPLY TO SCALP AT [facility] UPON AWAKENING   Yes [provider]  fluticasone (FLONASE) 50 MCG/ACT nasal spray Use 2 spray(s) in each nostril once daily 10/28/19  Yes Marrian Salvage, FNP  ibuprofen (ADVIL) 800 MG tablet Take 1 tablet by mouth twice daily 12/12/18  Yes Hulan Saas M, DO  lansoprazole (PREVACID SOLUTAB) 30 MG disintegrating tablet Take 1 tablet (30 mg total) by mouth 2 (two) times daily. 11/29/20  Yes Zehr, Laban Emperor, PA-C   levocetirizine (XYZAL) 5 MG tablet Take 1 tablet (5 mg total) by mouth every evening for 15 days. 12/07/20 02/08/21 Yes Michela Pitcher, NP  naproxen (NAPROSYN) 500 MG tablet Take 1 tablet (500 mg total) by mouth 2 (two) times daily as needed. 10/06/19  Yes Hulan Saas M, DO  ondansetron (ZOFRAN) 4 MG tablet Take 1 tablet (4 mg total) by mouth every 8 (eight) hours as needed. 07/09/19  Yes Hyatt, Max T, DPM  Salicylic Acid 3 % SHAM Use daily as directed for dry scalp 09/23/17  Yes Marrian Salvage, FNP  selenium sulfide (SELSUN) 2.5 % shampoo USE LOTION EXTERNALLY  TO AFFECTED AREA ONCE DAILY AS NEEDED IRRITATION 02/15/20  Yes Marrian Salvage, FNP  XIIDRA 5 % SOLN INSTILL 1 DROP TWICE DAILY INTO [facility] EYE 02/02/20  Yes [provider]    Allergies  Allergen Reactions   Bactrim [Sulfamethoxazole-Trimethoprim]     Patient Active Problem List   Diagnosis Date Noted   Acute cough 12/07/2020   Viral upper respiratory tract infection 12/07/2020   Gastroesophageal reflux disease 11/29/2020   Dysphagia 56/81/2751   History of Helicobacter pylori infection 11/29/2020   De Quervain's disease (radial styloid tenosynovitis) 03/31/2020   Nonallopathic lesion of lumbosacral region 03/01/2019   Nonallopathic lesion of cervical region 03/01/2019   Nonallopathic lesion of sacral region 03/01/2019   Nonallopathic lesion of thoracic region 03/01/2019   Nonallopathic lesion of rib cage 03/01/2019   Vitamin  D deficiency 05/08/2018   Piriformis syndrome, left 02/25/2017   Mixed stress and urge urinary incontinence 02/07/2016   Carpal tunnel syndrome 01/03/2016   Polyarthralgia 01/03/2016   Galactocele associated with childbirth 11/07/2015   Multigravida of advanced maternal age 18/20/2017   Radial tunnel syndrome 06/17/2014   Cervical radiculopathy 06/10/2014   Bilateral low back pain with right-sided sciatica 06/10/2014   Bipolar depression ([place]) 05/17/2014   Chronic night sweats  06/26/2013   History of recurrent spontaneous abortion, not currently pregnant 01/26/2012   Obesity (BMI 30.0-34.9) 01/25/2012    Past Medical History:  Diagnosis Date   Anxiety    Arthritis    Carpal tunnel syndrome    Depression    GERD (gastroesophageal reflux disease)    Hammer toe 04/2013   bilateral 4th and 5th    Ingrown left big toenail 04/2013   Mental disorder    Neuromuscular disorder ([place])    Post term pregnancy over 40 weeks 09/21/2015   SVD (spontaneous vaginal delivery) 09/22/2015   UTI (lower urinary tract infection)     Past Surgical History:  Procedure Laterality Date   BREAST LIFT Bilateral    CARPAL TUNNEL RELEASE     CORRECTION HAMMER TOE Bilateral    CRYOABLATION  2004   CYSTOSCOPY WITH HYDRODISTENSION AND BIOPSY  07/28/2004   with urethral dilatation   DILATION AND CURETTAGE OF UTERUS     DILATION AND EVACUATION  12/21/2011   Procedure: DILATATION AND EVACUATION;  Surgeon: Terrance Mass, MD;  Location: [place];  Service: Gynecology;  Laterality: N/A;  first trimester    HAMMER TOE SURGERY Bilateral 04/20/2013   Procedure: BILATERAL HAMMER TOE REPAIR AND EXCISION NAIL PERMANENT LEFT FIRST;  Surgeon: Harriet Masson, DPM;  Location: [place];  Service: Podiatry;  Laterality: Bilateral;  Hammer toe repair fourth and fifth toes bilateral feet with screw placement fourth toes and partial nail excision left great toe   HYSTEROSALPINGOGRAM     REDUCTION MAMMAPLASTY Bilateral 2020   TOE SURGERY     ingrown toenail   WISDOM TOOTH EXTRACTION      Social History   Socioeconomic History   Marital status: Single    Spouse name: Not on file   Number of children: 1   Years of education: Not on file   Highest education level: Not on file  Occupational History   Occupation: Homemaker  Tobacco Use   Smoking status: Never   Smokeless tobacco: Never  Vaping Use   Vaping Use: Never used  Substance and Sexual Activity    Alcohol use: Yes    Comment: occasionally   Drug use: No   Sexual activity: Yes    Birth control/protection: I.U.D.  Other Topics Concern   Not on file  Social History Narrative   Not on file   Social Determinants of Health   Financial Resource Strain: Low Risk    Difficulty of Paying Living Expenses: Not hard at all  Food Insecurity: No Food Insecurity   Worried About Charity fundraiser in the Last Year: Never true   [place] in the Last Year: Never true  Transportation Needs: No Transportation Needs   Lack of Transportation (Medical): No   Lack of Transportation (Non-Medical): No  Physical Activity: Sufficiently Active   Days of Exercise per Week: 5 days   Minutes of Exercise per Session: 30 min  Stress: No Stress Concern Present   Feeling of Stress : Not at all  Social Connections: Moderately Integrated   Frequency of Communication with Friends and Family: More than three times a week   Frequency of Social Gatherings with Friends and Family: More than three times a week   Attends Religious Services: More than 4 times per year   Active Member of [employer] or Organizations: Yes   Attends Music therapist: More than 4 times per year   Marital Status: Never married  Human resources officer Violence: Not At Risk   Fear of Current or Ex-Partner: No   Emotionally Abused: No   Physically Abused: No   Sexually Abused: No    Family History  Problem Relation Age of Onset   Lung cancer Father    Lung cancer Paternal Grandfather    Seizures Mother    Arthritis Mother    Thyroid disease Maternal Grandmother    Heart disease Paternal Grandmother        2 CABG     Review of Systems  Constitutional: Negative.  Negative for chills and fever.       Weight gain  HENT: Negative.  Negative for congestion and sore throat.   Respiratory: Negative.  Negative for cough and shortness of breath.   Cardiovascular: Negative.  Negative for chest pain and palpitations.   Gastrointestinal: Negative.  Negative for abdominal pain, nausea and vomiting.  Genitourinary: Negative.  Negative for dysuria.  Musculoskeletal:  Positive for joint pain.  Skin: Negative.  Negative for rash.  Neurological: Negative.  Negative for dizziness and headaches.    Physical Exam Vitals reviewed.  Constitutional:      Appearance: Normal appearance.  HENT:     Head: Normocephalic.  Eyes:     Extraocular Movements: Extraocular movements intact.     Conjunctiva/sclera: Conjunctivae normal.     Pupils: Pupils are equal, round, and reactive to light.  Cardiovascular:     Rate and Rhythm: Normal rate and regular rhythm.     Pulses: Normal pulses.     Heart sounds: Normal heart sounds.  Pulmonary:     Effort: Pulmonary effort is normal.     Breath sounds: Normal breath sounds.  Abdominal:     Palpations: Abdomen is soft.     Tenderness: There is no abdominal tenderness.  Musculoskeletal:        General: No swelling or deformity.     Cervical back: No tenderness.     Comments: Mild tenderness to right forearm and right wrist.  No erythema or visible swelling  Lymphadenopathy:     Cervical: No cervical adenopathy.  Skin:    General: Skin is warm and dry.     Capillary Refill: Capillary refill takes less than 2 seconds.  Neurological:     General: No focal deficit present.     Mental Status: She is alert and oriented to person, place, and time.  Psychiatric:        Mood and Affect: Mood normal.        Behavior: Behavior normal.     ASSESSMENT & PLAN: Problem List Items Addressed This Visit       Nervous and Auditory   Carpal tunnel syndrome - Primary    Clinically active.  May benefit from corticosteroid injection.        Other   Weight gain    Multifactorial.  Blood work today to rule out metabolic/endocrine disease. Could be stress related or medication induced.      Relevant Orders   CBC with Differential/Platelet   Comprehensive metabolic panel  Hemoglobin A1c   Lipid panel   Thyroid Panel With TSH   Family history of thyroid disease    Thyroid panel with TSH done today. Clinically euthyroid.      Patient Instructions  Health Maintenance, Female Adopting a healthy lifestyle and getting preventive care are important in promoting health and wellness. Ask your health care provider about: The right schedule for you to have regular tests and exams. Things you can do on your own to prevent diseases and keep yourself healthy. What should I know about diet, weight, and exercise? Eat a healthy diet  Eat a diet that includes plenty of vegetables, fruits, low-fat dairy products, and lean protein. Do not eat a lot of foods that are high in solid fats, added sugars, or sodium. Maintain a healthy weight Body mass index (BMI) is used to identify weight problems. It estimates body fat based on height and weight. Your health care provider can help determine your BMI and help you achieve or maintain a healthy weight. Get regular exercise Get regular exercise. This is one of the most important things you can do for your health. Most adults should: Exercise for at least 150 minutes each week. The exercise should increase your heart rate and make you sweat (moderate-intensity exercise). Do strengthening exercises at least twice a week. This is in addition to the moderate-intensity exercise. Spend less time sitting. Even light physical activity can be beneficial. Watch cholesterol and blood lipids Have your blood tested for lipids and cholesterol at 46 years of age, then have this test every 5 years. Have your cholesterol levels checked more often if: Your lipid or cholesterol levels are high. You are older than 46 years of age. You are at high risk for heart disease. What should I know about cancer screening? Depending on your health history and family history, you may need to have cancer screening at various ages. This may include screening  for: Breast cancer. Cervical cancer. Colorectal cancer. Skin cancer. Lung cancer. What should I know about heart disease, diabetes, and high blood pressure? Blood pressure and heart disease High blood pressure causes heart disease and increases the risk of stroke. This is more likely to develop in people who have high blood pressure readings or are overweight. Have your blood pressure checked: Every 3-5 years if you are 35-1 years of age. Every year if you are 18 years old or older. Diabetes Have regular diabetes screenings. This checks your fasting blood sugar level. Have the screening done: Once every three years after age 58 if you are at a normal weight and have a low risk for diabetes. More often and at a younger age if you are overweight or have a high risk for diabetes. What should I know about preventing infection? Hepatitis B If you have a higher risk for hepatitis B, you should be screened for this virus. Talk with your health care provider to find out if you are at risk for hepatitis B infection. Hepatitis C Testing is recommended for: Everyone born from [birth year] through 1965. Anyone with known risk factors for hepatitis C. Sexually transmitted infections (STIs) Get screened for STIs, including gonorrhea and chlamydia, if: You are sexually active and are younger than 46 years of age. You are older than 46 years of age and your health care provider tells you that you are at risk for this type of infection. Your sexual activity has changed since you were last screened, and you are at increased risk for chlamydia or  gonorrhea. Ask your health care provider if you are at risk. Ask your health care provider about whether you are at high risk for HIV. Your health care provider may recommend a prescription medicine to help prevent HIV infection. If you choose to take medicine to prevent HIV, you should first get tested for HIV. You should then be tested every 3 months for as long as you  are taking the medicine. Pregnancy If you are about to stop having your period (premenopausal) and you may become pregnant, seek counseling before you get pregnant. Take 400 to 800 micrograms (mcg) of folic acid every day if you become pregnant. Ask for birth control (contraception) if you want to prevent pregnancy. Osteoporosis and menopause Osteoporosis is a disease in which the bones lose minerals and strength with aging. This can result in bone fractures. If you are 75 years old or older, or if you are at risk for osteoporosis and fractures, ask your health care provider if you should: Be screened for bone loss. Take a calcium or vitamin D supplement to lower your risk of fractures. Be given hormone replacement therapy (HRT) to treat symptoms of menopause. Follow these instructions at home: Alcohol use Do not drink alcohol if: Your health care provider tells you not to drink. You are pregnant, may be pregnant, or are planning to become pregnant. If you drink alcohol: Limit how much you have to: 0-1 drink a day. Know how much alcohol is in your drink. In the [place] one drink equals one 12 oz bottle of beer (355 mL), one 5 oz glass of wine (148 mL), or one 1 oz glass of hard liquor (44 mL). Lifestyle Do not use any products that contain nicotine or tobacco. These products include cigarettes, chewing tobacco, and vaping devices, such as e-cigarettes. If you need help quitting, ask your health care provider. Do not use street drugs. Do not share needles. Ask your health care provider for help if you need support or information about quitting drugs. General instructions Schedule regular health, dental, and eye exams. Stay current with your vaccines. Tell your health care provider if: You often feel depressed. You have ever been abused or do not feel safe at home. Summary Adopting a healthy lifestyle and getting preventive care are important in promoting health and wellness. Follow your  health care provider's instructions about healthy diet, exercising, and getting tested or screened for diseases. Follow your health care provider's instructions on monitoring your cholesterol and blood pressure. This information is not intended to replace advice given to you by your health care provider. Make sure you discuss any questions you have with your health care provider. Document Revised: 05/09/2020 Document Reviewed: 05/09/2020 Elsevier Patient Education  2022 [place], MD [facility]

## 2021-02-08 NOTE — Assessment & Plan Note (Addendum)
Multifactorial.  Blood work today to rule out metabolic/endocrine disease. Could be stress related or medication induced.

## 2021-02-08 NOTE — Patient Instructions (Signed)

## 2021-02-08 NOTE — Assessment & Plan Note (Signed)
Clinically active.  May benefit from corticosteroid injection.

## 2021-02-09 LAB — THYROID PANEL WITH TSH
Free Thyroxine Index: 1.9 (ref 1.4–3.8)
T3 Uptake: 25 % (ref 22–35)
T4, Total: 7.6 ug/dL (ref 5.1–11.9)
TSH: 1.41 mIU/L

## 2021-02-10 ENCOUNTER — Encounter (HOSPITAL_COMMUNITY): Payer: Self-pay | Admitting: Gastroenterology

## 2021-02-10 ENCOUNTER — Ambulatory Visit (HOSPITAL_COMMUNITY)
Admission: RE | Admit: 2021-02-10 | Discharge: 2021-02-10 | Disposition: A | Discharge status: Home / Self Care | Payer: Medicare Other | Attending: Gastroenterology | Admitting: Gastroenterology

## 2021-02-10 ENCOUNTER — Encounter (HOSPITAL_COMMUNITY)
Admission: RE | Disposition: A | Discharge status: Home / Self Care | Payer: Self-pay | Source: Home / Self Care | Attending: Gastroenterology

## 2021-02-10 DIAGNOSIS — R1319 Other dysphagia: Secondary | ICD-10-CM | POA: Diagnosis not present

## 2021-02-10 DIAGNOSIS — R1310 Dysphagia, unspecified: Secondary | ICD-10-CM | POA: Insufficient documentation

## 2021-02-10 HISTORY — PX: ESOPHAGEAL MANOMETRY: SHX5429

## 2021-02-10 SURGERY — MANOMETRY, ESOPHAGUS

## 2021-02-10 MED ORDER — LIDOCAINE VISCOUS HCL 2 % MT SOLN
OROMUCOSAL | Status: AC
  Filled 2021-02-10: qty 15

## 2021-02-10 SURGICAL SUPPLY — 2 items
FACESHIELD LNG OPTICON STERILE (SAFETY) IMPLANT
GLOVE BIO SURGEON STRL SZ8 (GLOVE) ×6 IMPLANT

## 2021-02-10 NOTE — Progress Notes (Signed)
[place] Phone: [phone] Subjective:    I'm seeing this patient by the request  of:  Sagardia, Ines Bloomer, MD  CC:   [id number]  Melissa [name] is a 46 y.o. female coming in with complaint of back and neck pain. OMT 12/21/2020. Patient states   Medications patient has been prescribed: None  Taking:         Reviewed prior external information including notes and imaging from previsou exam, outside providers and external EMR if available.   As well as notes that were available from care everywhere and other healthcare systems.  Past medical history, social, surgical and family history all reviewed in electronic medical record.  No pertanent information unless stated regarding to the chief complaint.   Past Medical History:  Diagnosis Date   Anxiety    Arthritis    Carpal tunnel syndrome    Depression    GERD (gastroesophageal reflux disease)    Hammer toe 04/2013   bilateral 4th and 5th    Ingrown left big toenail 04/2013   Mental disorder    Neuromuscular disorder ([place])    Post term pregnancy over 40 weeks 09/21/2015   SVD (spontaneous vaginal delivery) 09/22/2015   UTI (lower urinary tract infection)     Allergies  Allergen Reactions   Bactrim [Sulfamethoxazole-Trimethoprim]      Review of Systems:  No headache, visual changes, nausea, vomiting, diarrhea, constipation, dizziness, abdominal pain, skin rash, fevers, chills, night sweats, weight loss, swollen lymph nodes, body aches, joint swelling, chest pain, shortness of breath, mood changes. POSITIVE muscle aches  Objective  There were no vitals taken for this visit.   General: No apparent distress alert and oriented x3 mood and affect normal, dressed appropriately.  HEENT: Pupils equal, extraocular movements intact  Respiratory: Patient's speak in full sentences and does not appear short of breath  Cardiovascular: No lower  extremity edema, non tender, no erythema  Neuro: Cranial nerves II through XII are intact, neurovascularly intact in all extremities with 2+ DTRs and 2+ pulses.  Gait normal with good balance and coordination.  MSK:  Non tender with full range of motion and good stability and symmetric strength and tone of shoulders, elbows, wrist, hip, knee and ankles bilaterally.  Back - Normal skin, Spine with normal alignment and no deformity.  No tenderness to vertebral process palpation.  Paraspinous muscles are not tender and without spasm.   Range of motion is full at neck and lumbar sacral regions  Osteopathic findings  C2 flexed rotated and side bent right C6 flexed rotated and side bent left T3 extended rotated and side bent right inhaled rib T9 extended rotated and side bent left L2 flexed rotated and side bent right Sacrum right on right       Assessment and Plan:    Nonallopathic problems  Decision today to treat with OMT was based on Physical Exam  After verbal consent patient was treated with HVLA, ME, FPR techniques in cervical, rib, thoracic, lumbar, and sacral  areas  Patient tolerated the procedure well with improvement in symptoms  Patient given exercises, stretches and lifestyle modifications  See medications in patient instructions if given  Patient will follow up in 4-8 weeks      The above documentation has been reviewed and is accurate and complete [name]       Note: This dictation was prepared with Dragon dictation along with smaller phrase technology. Any  transcriptional errors that result from this process are unintentional.

## 2021-02-10 NOTE — Progress Notes (Signed)
Esophageal Manometry done per protocol. Patient tolerated well without distress or complication.  Report to be sent to Kavitha Nandigam, MD. °

## 2021-02-13 ENCOUNTER — Other Ambulatory Visit: Payer: Self-pay

## 2021-02-13 ENCOUNTER — Encounter (HOSPITAL_COMMUNITY): Payer: Self-pay | Admitting: Gastroenterology

## 2021-02-13 ENCOUNTER — Ambulatory Visit (INDEPENDENT_AMBULATORY_CARE_PROVIDER_SITE_OTHER): Payer: Medicare Other | Admitting: Family Medicine

## 2021-02-13 VITALS — BP 128/88 | HR 100 | Ht 61.0 in | Wt 166.0 lb

## 2021-02-13 DIAGNOSIS — M5441 Lumbago with sciatica, right side: Secondary | ICD-10-CM | POA: Diagnosis not present

## 2021-02-13 DIAGNOSIS — M9903 Segmental and somatic dysfunction of lumbar region: Secondary | ICD-10-CM | POA: Diagnosis not present

## 2021-02-13 DIAGNOSIS — M9901 Segmental and somatic dysfunction of cervical region: Secondary | ICD-10-CM | POA: Diagnosis not present

## 2021-02-13 DIAGNOSIS — G8929 Other chronic pain: Secondary | ICD-10-CM | POA: Diagnosis not present

## 2021-02-13 DIAGNOSIS — M9904 Segmental and somatic dysfunction of sacral region: Secondary | ICD-10-CM | POA: Diagnosis not present

## 2021-02-13 DIAGNOSIS — M9902 Segmental and somatic dysfunction of thoracic region: Secondary | ICD-10-CM

## 2021-02-13 NOTE — Assessment & Plan Note (Signed)
Low back exam does have some loss of lordosis.  Some tenderness to palpation of the paraspinal musculature.  Patient has responded fairly well to osteopathic manipulation.  No significant changes in the medication at this moment.  Encourage patient to continue to work on core strengthening.  We will follow-up with me again in 6 to 8 weeks otherwise.

## 2021-02-13 NOTE — Progress Notes (Signed)
Melissa [name] Phone: [phone] Subjective:   Melissa [name], am serving as a scribe for Dr. Hulan Saas. This visit occurred during the SARS-CoV-2 public health emergency.  Safety protocols were in place, including screening questions prior to the visit, additional usage of staff PPE, and extensive cleaning of exam room while observing appropriate contact time as indicated for disinfecting solutions.    I'm seeing this patient by the request  of:  Sagardia, Ines Bloomer, MD  CC: Neck and back pain follow-up  [id number]  Melissa [name] is a 46 y.o. female coming in with complaint of back and neck pain. OMT 12/21/2020. Patient states that her back is doing better. Continues to have R shoulder pain.   Medications patient has been prescribed: None  Taking:         Reviewed prior external information including notes and imaging from previsou exam, outside providers and external EMR if available.   As well as notes that were available from care everywhere and other healthcare systems.  Past medical history, social, surgical and family history all reviewed in electronic medical record.  [name] pertanent information unless stated regarding to the chief complaint.   Past Medical History:  Diagnosis Date   Anxiety    Arthritis    Carpal tunnel syndrome    Depression    GERD (gastroesophageal reflux disease)    Hammer toe 04/2013   bilateral 4th and 5th    Ingrown left big toenail 04/2013   Mental disorder    Neuromuscular disorder ([place])    Post term pregnancy over 40 weeks 09/21/2015   SVD (spontaneous vaginal delivery) 09/22/2015   UTI (lower urinary tract infection)     Allergies  Allergen Reactions   Bactrim [Sulfamethoxazole-Trimethoprim]      Review of Systems:  [name] headache, visual changes, nausea, vomiting, diarrhea, constipation, dizziness, abdominal pain, skin rash, fevers, chills, night  sweats, weight loss, swollen lymph nodes, joint swelling, chest pain, shortness of breath, mood changes. POSITIVE muscle aches, body aches  Objective  Blood pressure 128/88, pulse 100, height 5\' 1"  (1.549 m), weight 166 lb (75.3 kg), SpO2 99 %.   General: [name] apparent distress alert and oriented x3 mood and affect normal, dressed appropriately.  HEENT: Pupils equal, extraocular movements intact  Respiratory: Patient's speak in full sentences and does not appear short of breath  Cardiovascular: [name] lower extremity edema, non tender, [name] erythema  Low back exam does have some loss of lordosis.  Some tenderness to palpation over the paraspinal musculature.  Tightness noted with Corky Sox bilaterally right greater than left.  Osteopathic findings  C2 flexed rotated and side bent right C4 flexed rotated and side bent left T4 extended rotated and side bent right inhaled rib T9 extended rotated and side bent left L2 flexed rotated and side bent right Sacrum right on right       Assessment and Plan:  Bilateral low back pain with right-sided sciatica Low back exam does have some loss of lordosis.  Some tenderness to palpation of the paraspinal musculature.  Patient has responded fairly well to osteopathic manipulation.  [name] significant changes in the medication at this moment.  Encourage patient to continue to work on core strengthening.  We will follow-up with me again in 6 to 8 weeks otherwise.   Nonallopathic problems  Decision today to treat with OMT was based on Physical Exam  After verbal consent patient was treated with HVLA, ME,  FPR techniques in cervical, rib, thoracic, lumbar, and sacral  areas  Patient tolerated the procedure well with improvement in symptoms  Patient given exercises, stretches and lifestyle modifications  See medications in patient instructions if given  Patient will follow up in 4-8 weeks      The above documentation has been reviewed and is accurate and  complete Lyndal Pulley, DO        Note: This dictation was prepared with Dragon dictation along with smaller phrase technology. Any transcriptional errors that result from this process are unintentional.

## 2021-02-26 IMAGING — MG MM BREAST LOCALIZATION CLIP
6 of 10 series · 6 of 30 positions shown · non-contrast
Comparison: Previous exam(s).

CLINICAL DATA: Post procedure mammogram for clip placement.

EXAM:
DIAGNOSTIC RIGHT MAMMOGRAM POST ULTRASOUND BIOPSY

[R MLO synth-2D (1 of 3)]
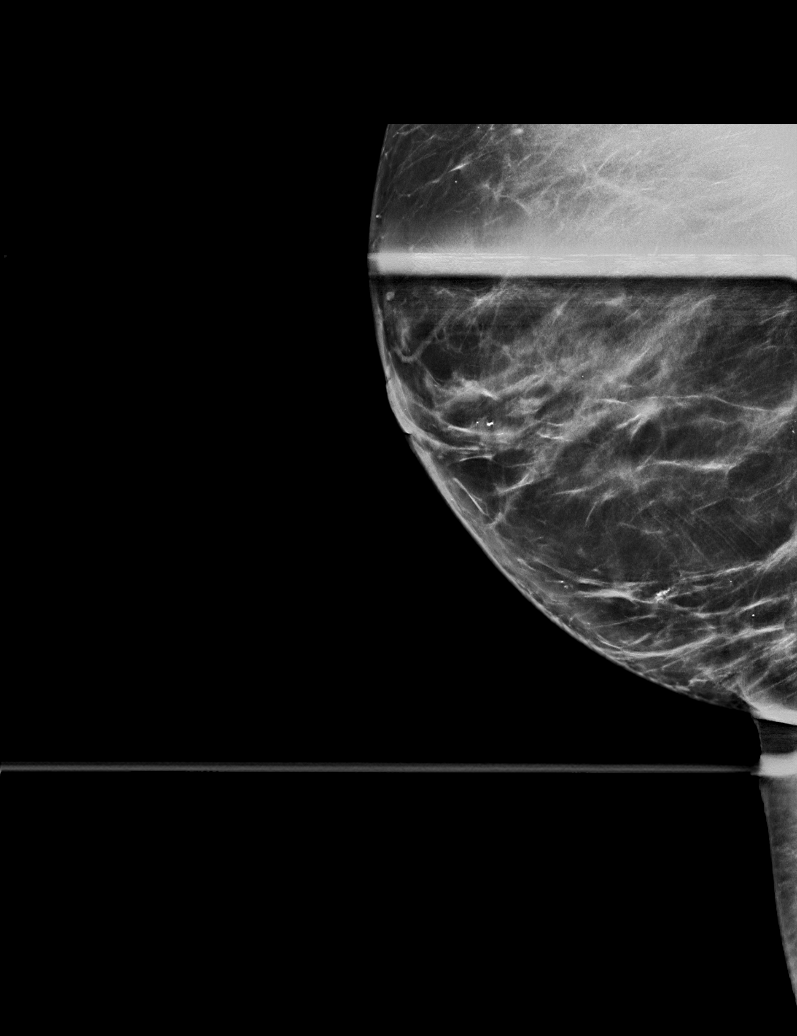

[R MLO synth-2D (2 of 3)]
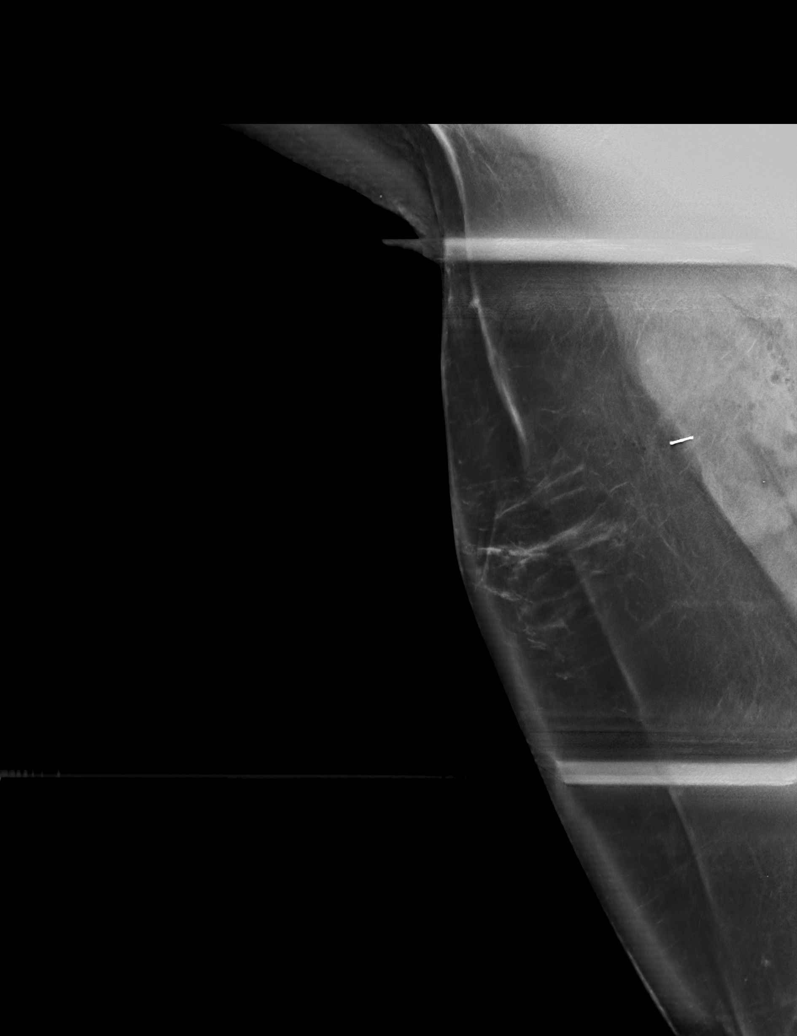

[R CC synth-2D]
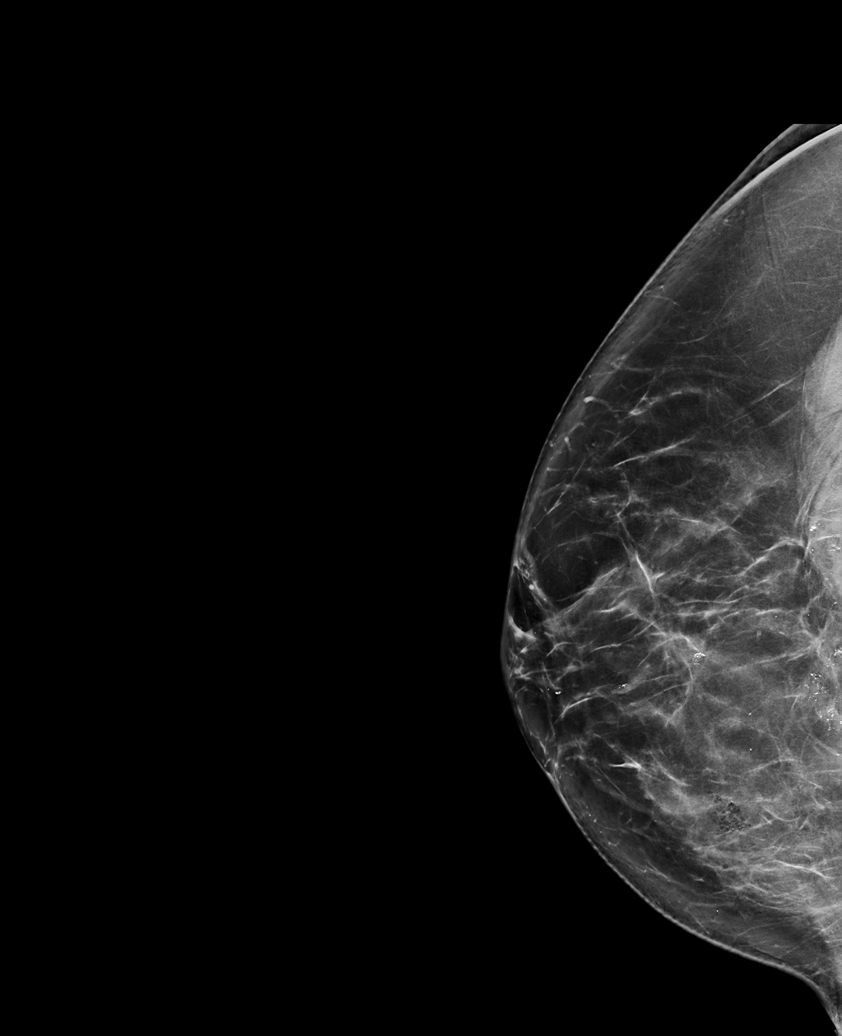

[R ML synth-2D]
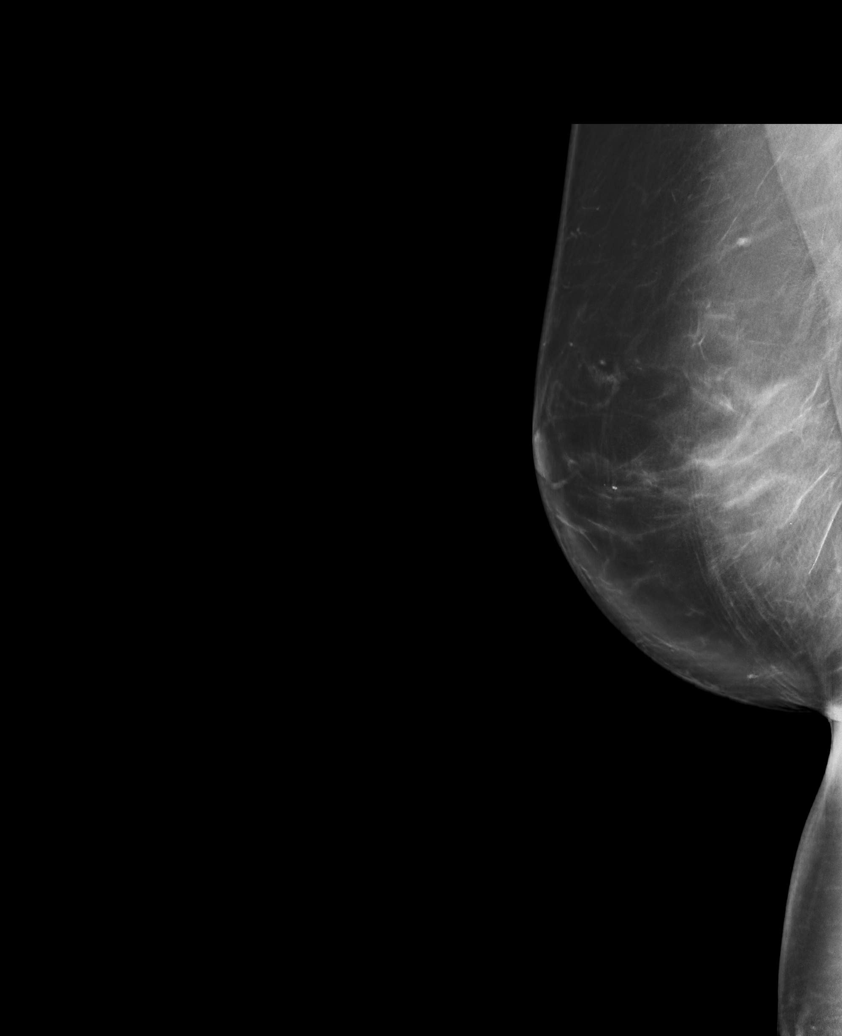

[R MLO synth-2D (3 of 3)]
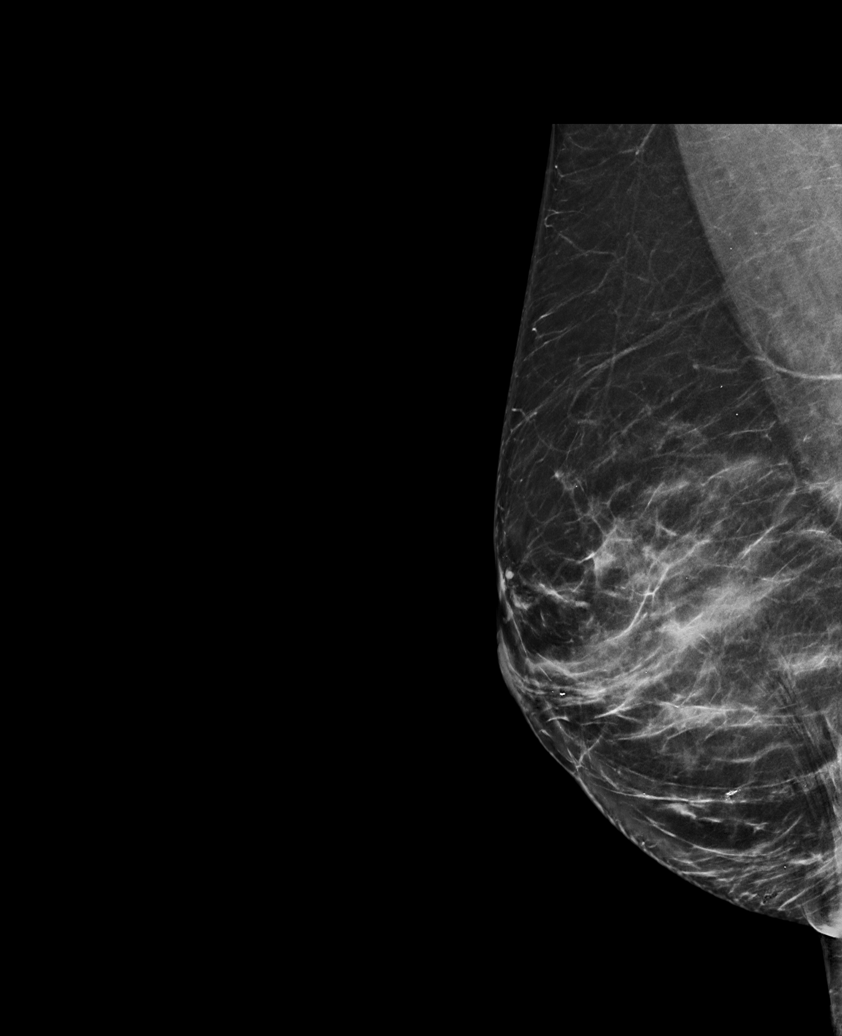

[R MLO tomo · tomo slice 53/104.0]
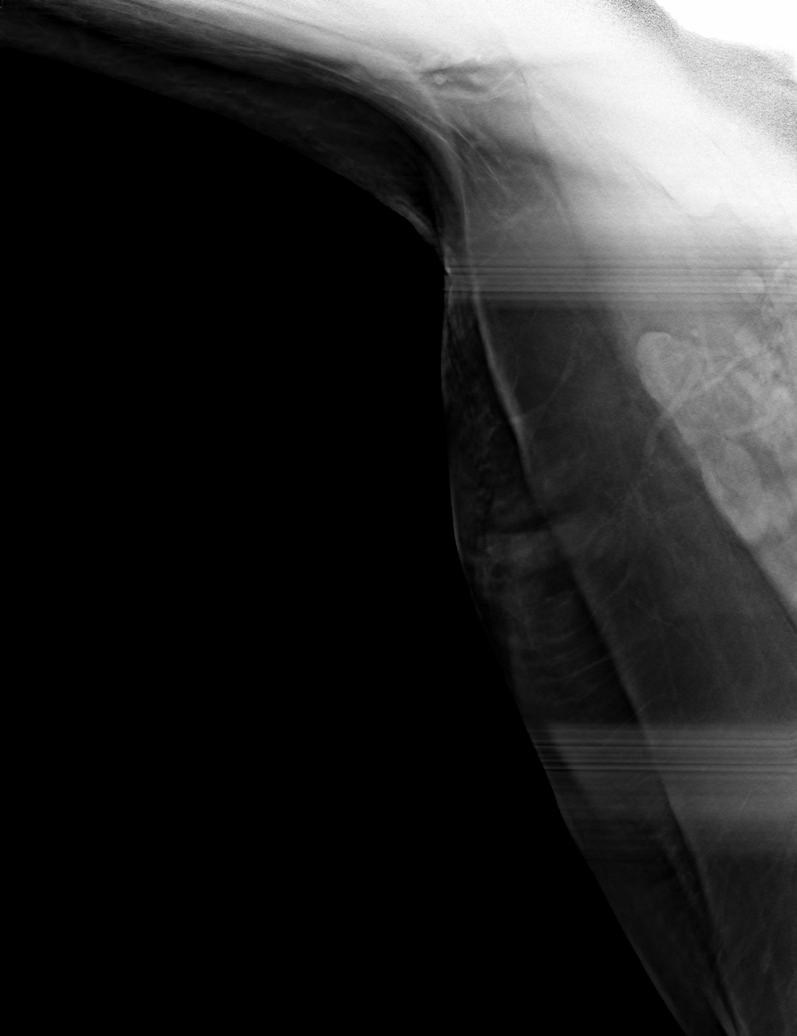

[6 of 30 positions shown; findings below may reference images not displayed]

FINDINGS: Mammographic images were obtained following ultrasound guided biopsy
of a right breast mass at 5 o'clock. The biopsy marking clip could
not be visualized due to the far inferior and medial location of the
biopsy site. The biopsy marking clip was well visualized deploying
into the mass under ultrasound guidance.

Mammographic images were obtained following ultrasound guided biopsy
of a right axillary lymph node. The tribell biopsy marking clip is
in expected position at the site of biopsy.
IMPRESSION: Appropriate positioning of the tribell shaped biopsy marking clip at
the site of biopsy in the right axilla

The ribbon biopsy marking clip was visualized deploying into the
right breast mass at 5 o'clock under ultrasound guidance but could
not be visualized mammographically due to the medial and inferior
location.

Final Assessment: Post Procedure Mammograms for Marker Placement

## 2021-02-28 ENCOUNTER — Ambulatory Visit: Payer: Medicare Other | Admitting: Podiatry

## 2021-02-28 ENCOUNTER — Other Ambulatory Visit: Payer: Self-pay

## 2021-02-28 ENCOUNTER — Encounter: Payer: Self-pay | Admitting: Podiatry

## 2021-02-28 ENCOUNTER — Ambulatory Visit: Payer: Medicare Other

## 2021-02-28 DIAGNOSIS — B078 Other viral warts: Secondary | ICD-10-CM | POA: Diagnosis not present

## 2021-02-28 DIAGNOSIS — L218 Other seborrheic dermatitis: Secondary | ICD-10-CM | POA: Diagnosis not present

## 2021-02-28 DIAGNOSIS — M7751 Other enthesopathy of right foot: Secondary | ICD-10-CM | POA: Diagnosis not present

## 2021-02-28 DIAGNOSIS — D2371 Other benign neoplasm of skin of right lower limb, including hip: Secondary | ICD-10-CM | POA: Diagnosis not present

## 2021-02-28 DIAGNOSIS — M778 Other enthesopathies, not elsewhere classified: Secondary | ICD-10-CM | POA: Diagnosis not present

## 2021-02-28 DIAGNOSIS — M7752 Other enthesopathy of left foot: Secondary | ICD-10-CM | POA: Diagnosis not present

## 2021-02-28 MED ORDER — DEXAMETHASONE SODIUM PHOSPHATE 120 MG/30ML IJ SOLN
6.0000 mg | Freq: Once | INTRAMUSCULAR | Status: AC
  Administered 2021-02-28: 6 mg via INTRA_ARTICULAR

## 2021-02-28 NOTE — Progress Notes (Signed)
She presents today with 3 areas of complaint as she refers to the lateral aspect of the fifth toes bilaterally states they are just sore as well as a painful area beneath the fifth metatarsal head of the right foot.  Objective: Vital signs are stable alert and oriented x3.  There is no erythema edema cellulitis drainage or odor though she does have bursitis beneath the fifth metatarsal head of the right foot which is painful on palpation the fifth toes demonstrate some mild fluctuance along the lateral aspect of the PIPJ.  Assessment: Capsulitis fifth digits bilateral bursitis fifth metatarsal head right and reactive hyper keratoma benign skin lesions of fifth right.  Plan: Debrided benign skin lesion.  I injected the other areas including the bursa and the capsulitis bilateral fifth with dexamethasone 2 mg each injection site.  Tolerated procedure well without complications follow-up with her on an as-needed basis.

## 2021-03-08 ENCOUNTER — Other Ambulatory Visit: Payer: Self-pay

## 2021-03-08 ENCOUNTER — Encounter: Payer: Self-pay | Admitting: Internal Medicine

## 2021-03-08 ENCOUNTER — Ambulatory Visit (INDEPENDENT_AMBULATORY_CARE_PROVIDER_SITE_OTHER): Payer: Medicare Other | Admitting: Internal Medicine

## 2021-03-08 VITALS — BP 120/84 | HR 101 | Temp 98.1°F | Ht 61.0 in | Wt 161.6 lb

## 2021-03-08 DIAGNOSIS — T7840XA Allergy, unspecified, initial encounter: Secondary | ICD-10-CM | POA: Diagnosis not present

## 2021-03-08 DIAGNOSIS — T783XXA Angioneurotic edema, initial encounter: Secondary | ICD-10-CM

## 2021-03-08 MED ORDER — METHYLPREDNISOLONE ACETATE 80 MG/ML IJ SUSP
80.0000 mg | Freq: Once | INTRAMUSCULAR | Status: AC
  Administered 2021-03-08: 80 mg via INTRAMUSCULAR

## 2021-03-08 NOTE — Patient Instructions (Signed)
? ? ? ?You received a steroid injection today.  Continue the claritin daily for a few days. ? ?If this happens again you should see an allergist.  ? ? ?Carry claritin or benadryl with you at all times.    ? ? ? ?Angioedema ?Angioedema is the sudden swelling of tissue in the body. Angioedema can affect any part of the body, including the legs, hands, genitals, face, mouth, lips, and internal organs, like your intestines. ?Depending on the cause, angioedema may happen just once. However, some people may have repeated bouts of angioedema during their lives. ?Symptoms may be mild and may occur along with other allergic symptoms such as itchy, red, swollen areas of skin (hives). Severe angioedema can be life-threatening if it affects the air passages and blocks breathing. ?What are the causes? ?This condition may be caused by: ?Foods, such as milk, eggs, shellfish, wheat, or nuts. ?Certain medicines, such as ACE inhibitors, birth control pills, dyes used in X-rays, or NSAIDs, such as ibuprofen. ?Hereditary angioedema (HAE) is genetic. Episodes can be triggered by: ?Illness, infection, or emotional or physical stress. ?Changes in hormone levels. ?Exercise. ?Minor surgical or dental procedures. ?In some cases, the cause of this condition is not known. ?What increases the risk? ?You are more likely to develop HAE if you have family members with this condition.  ?What are the signs or symptoms? ?Symptoms of this condition depend on where the swelling happens.  ?Symptoms of this condition include: ?Swollen skin. ?Hives. ?Pain, pressure, or tenderness in the affected area. ?Swollen eyelids, face, lips, or tongue. ?Trouble drinking, swallowing, or closing the mouth completely. ?Hoarseness or sore throat. ?Wheezing or trouble breathing. ?If your internal organs are affected, symptoms may also include: ?Nausea. ?Pain in the abdomen. ?Vomiting or diarrhea. ?Trouble swallowing. ?Trouble passing urine. ?How is this diagnosed? ?This  condition may be diagnosed based on: ?An exam of the affected area. ?Your medical history. ?Whether anyone in your family has had this condition before. ?A review of any medicines you have been taking. ?Tests, including: ?Allergy skin tests to see if the condition was caused by an allergic reaction. ?Blood tests to see if the condition was caused by certain inherited or genetic diseases. ?How is this treated? ?Treatment for this condition depends on the cause and severity of your symptoms. It may involve any of the following: ?Avoiding triggers, if they are known. Triggers may include foods or environmental allergens. ?Stopping medicines permanently if they cause the condition. These include ACE inhibitors. ?Taking medicines to treat symptoms or prevent future episodes. These may include: ?Antihistamines. ?Epinephrine injections. ?Steroids. ?Blood products to treat specific types of non-allergic angioedema. ?Breathing tubes or ventilators in severe cases in which breathing is affected. ?Severe cases of angioedema are treated at the hospital. Mild to moderate angioedema usually gets better in 24-48 hours. ?Follow these instructions at home: ? ?Take over-the-counter and prescription medicines only as told by your health care provider. ?If you were given medicines for emergency allergy treatment, always carry them with you. This includes epinephrine injector kits. ?Wear a medical bracelet as told by your health care provider. ?If something triggers your condition, avoid the trigger. Triggers can be foods, environmental allergens, stress, or exercise. ?Avoid all medicines that caused your angioedema. This is for your entire life. ?If your condition is inherited and you are thinking about having children, talk to your health care provider. It is important to discuss the risks of passing on the condition to your children. ?Where to find more  information ?American Academy of Allergy Asthma & Immunology:  www.aaaai.org ?Contact a health care provider if: ?You continue to have repeated episodes of angioedema. ?Episodes of angioedema start to happen more often than they used to, even after you take steps to prevent them. ?You have episodes of angioedema that are more severe than they have been before, even after you take steps to prevent them. ?You are thinking about having children. ?Get help right away if: ?You have severe swelling of your mouth, tongue, or lips. ?Your swelling gets worse. ?You have trouble breathing, swallowing, or talking. ?You have chest pain, dizziness or light-headedness, or you pass out. ?These symptoms may represent a serious problem that is an emergency. Do not wait to see if the symptoms will go away. Get medical help right away. Call your local emergency services (911 in the [place] Do not drive yourself to the hospital. ?Summary ?Angioedema is the sudden swelling of tissues. ?It is important to be aware of all triggers or causes for your angioedema and to avoid them. ?Treatment for this condition depends on the cause and severity of your symptoms. ?Severe angioedema can be life-threatening if it blocks the air passages. ?This information is not intended to replace advice given to you by your health care provider. Make sure you discuss any questions you have with your health care provider. ?Document Revised: 04/20/2020 Document Reviewed: 04/20/2020 ?Elsevier Patient Education ? 2022 [place]. ? ? ?

## 2021-03-08 NOTE — Addendum Note (Signed)
Addended by: Marcina Millard on: 03/08/2021 01:41 PM ? ? Modules accepted: Orders ? ?

## 2021-03-08 NOTE — Progress Notes (Signed)
? ? ?Subjective:  ? ? Patient ID: Melissa [name], female    DOB: 10-06-1975, 46 y.o.   MRN: [id number] ? ?This visit occurred during the SARS-CoV-2 public health emergency.  Safety protocols were in place, including screening questions prior to the visit, additional usage of staff PPE, and extensive cleaning of exam room while observing appropriate contact time as indicated for disinfecting solutions. ? ? ? ?HPI ?Melissa [name] is here for  ?Chief Complaint  ?Patient presents with  ? Rash  ? ? ?She woke up this morning and her lips were swollen, left eye swollen and she had a rash around neck and chest.  She had areas of rash on different parts of her body wherever she touched.  Last night she used olive oil on her skin - she typically uses olive oil but this was a different brand.  She took a Claritin this morning.  ? ? ?Her symptoms improved but still with rash on neck - not itchy.  She still has lip swelling - it has improved a little.  Eye lid is no longer swollen.  Rash on other parts of body has resolved.  ? ? ?Medications and allergies reviewed with patient and updated if appropriate. ? ?Current Outpatient Medications on File Prior to Visit  ?Medication Sig Dispense Refill  ? ammonium lactate (LAC-HYDRIN) 12 % lotion Apply 1 application topically as needed for dry skin. 400 g 5  ? amphetamine-dextroamphetamine (ADDERALL) 20 MG tablet TAKE 1 TABLET BY MOUTH IN THE MORNING AND 1 EVERYDAY AT NOON AND 1 TABLET AT 4PM  0  ? azelastine (ASTELIN) 0.1 % nasal spray Place 2 sprays into both nostrils 2 (two) times daily. Use in each nostril as directed 30 mL 12  ? buPROPion (WELLBUTRIN XL) 150 MG 24 hr tablet Take 450 mg by mouth daily.    ? escitalopram (LEXAPRO) 20 MG tablet     ? Fluocinolone Acetonide Body 0.01 % OIL fluocinolone 0.01 % scalp oil and shower cap ? APPLY TO SCALP AT [facility] [place]    ? fluticasone (FLONASE) 50 MCG/ACT nasal spray Use 2 spray(s) in each nostril once daily 16 g 11  ?  ibuprofen (ADVIL) 800 MG tablet Take 1 tablet by mouth twice daily 180 tablet 0  ? lansoprazole (PREVACID SOLUTAB) 30 MG disintegrating tablet Take 1 tablet (30 mg total) by mouth 2 (two) times daily. 180 tablet 3  ? naproxen (NAPROSYN) 500 MG tablet Take 1 tablet (500 mg total) by mouth 2 (two) times daily as needed. 180 tablet 1  ? ondansetron (ZOFRAN) 4 MG tablet Take 1 tablet (4 mg total) by mouth every 8 (eight) hours as needed. 20 tablet 0  ? PARoxetine (PAXIL) 10 MG tablet Take 10 mg by mouth daily.    ? Salicylic Acid 3 % SHAM Use daily as directed for dry scalp 236 mL 1  ? selenium sulfide (SELSUN) 2.5 % shampoo USE LOTION EXTERNALLY  TO AFFECTED AREA ONCE DAILY AS NEEDED IRRITATION 120 mL 0  ? tranexamic acid (LYSTEDA) 650 MG TABS tablet Take 650 mg by mouth daily.    ? XIIDRA 5 % SOLN INSTILL 1 DROP TWICE DAILY INTO EACH EYE    ? levocetirizine (XYZAL) 5 MG tablet Take 1 tablet (5 mg total) by mouth every evening for 15 days. 15 tablet 0  ? ?No current facility-administered medications on file prior to visit.  ? ? ?Review of Systems  ?Constitutional:  Negative for fever.  ?HENT:  Positive  for facial swelling. Negative for sore throat and trouble swallowing.   ?Respiratory:  Negative for cough, shortness of breath and wheezing.   ?Skin:  Positive for rash.  ?Neurological:  Negative for light-headedness and headaches.  ? ?   ?Objective:  ? ?Vitals:  ? 03/08/21 1300  ?BP: 120/84  ?Pulse: (!) 101  ?Temp: 98.1 ?F (36.7 ?C)  ?SpO2: 97%  ? ?BP Readings from Last 3 Encounters:  ?03/08/21 120/84  ?02/13/21 128/88  ?02/08/21 132/70  ? ?Wt Readings from Last 3 Encounters:  ?03/08/21 161 lb 9.6 oz (73.3 kg)  ?02/13/21 166 lb (75.3 kg)  ?02/08/21 163 lb (73.9 kg)  ? ?Body mass index is 30.53 kg/m?. ? ?  ?Physical Exam ?Constitutional:   ?   General: She is not in acute distress. ?   Appearance: Normal appearance. She is not ill-appearing.  ?HENT:  ?   Head: Normocephalic.  ?   Mouth/Throat:  ?   Mouth: Mucous  membranes are moist.  ?   Pharynx: No posterior oropharyngeal erythema.  ?   Comments: No swelling in oropharynx.   Upper lip mildly swollen ?Eyes:  ?   Conjunctiva/sclera: Conjunctivae normal.  ?   Comments: No eye lid swelling  ?Cardiovascular:  ?   Rate and Rhythm: Normal rate and regular rhythm.  ?   Heart sounds: No murmur heard. ?Pulmonary:  ?   Effort: Pulmonary effort is normal. No respiratory distress.  ?   Breath sounds: Normal breath sounds. No wheezing or rales.  ?Skin: ?   General: Skin is warm and dry.  ?   Findings: Rash (erythematous rash on neck) present.  ?Neurological:  ?   Mental Status: She is alert.  ? ?   ? ? ? ? ? ?Assessment & Plan:  ? ? ?Angioedema, allergic reaction: ?Acute - started this morning ?Had rash on neck and a few places on her body that she touched, upper lip angioedema, left eyelid swelling  ?No throat swelling or respiratory symptoms ?Related to using a different olive oil on her skin - will no longer use this brand ?Symptoms improved with claritin but still with mild rash and mild upper lip swelling ?Took claritin at home this morning - advised to continue to several days and carry claritin/benadryl with her ?She does want quick resolution of symptoms - Depo-medrol 80 mg IM x 1 ?Deferred referral to allergy at this time - advise if this occurs again she needs to see an allergist ? ? ? ? ? ?

## 2021-03-10 ENCOUNTER — Ambulatory Visit (INDEPENDENT_AMBULATORY_CARE_PROVIDER_SITE_OTHER): Payer: Medicare Other | Admitting: Nurse Practitioner

## 2021-03-10 ENCOUNTER — Other Ambulatory Visit: Payer: Self-pay

## 2021-03-10 ENCOUNTER — Encounter: Payer: Self-pay | Admitting: Nurse Practitioner

## 2021-03-10 VITALS — BP 126/78 | HR 70 | Temp 97.9°F | Ht 61.0 in | Wt 164.2 lb

## 2021-03-10 DIAGNOSIS — L509 Urticaria, unspecified: Secondary | ICD-10-CM | POA: Diagnosis not present

## 2021-03-10 LAB — CBC WITH DIFFERENTIAL/PLATELET
Basophils Absolute: 0 10*3/uL (ref 0.0–0.1)
Basophils Relative: 0.2 % (ref 0.0–3.0)
Eosinophils Absolute: 0 10*3/uL (ref 0.0–0.7)
Eosinophils Relative: 0.3 % (ref 0.0–5.0)
HCT: 38 % (ref 36.0–46.0)
Hemoglobin: 13 g/dL (ref 12.0–15.0)
Lymphocytes Relative: 13.1 % (ref 12.0–46.0)
Lymphs Abs: 1.4 10*3/uL (ref 0.7–4.0)
MCHC: 34.3 g/dL (ref 30.0–36.0)
MCV: 92.8 fl (ref 78.0–100.0)
Monocytes Absolute: 0.6 10*3/uL (ref 0.1–1.0)
Monocytes Relative: 5.4 % (ref 3.0–12.0)
Neutro Abs: 8.8 10*3/uL — ABNORMAL HIGH (ref 1.4–7.7)
Neutrophils Relative %: 81 % — ABNORMAL HIGH (ref 43.0–77.0)
Platelets: 332 10*3/uL (ref 150.0–400.0)
RBC: 4.09 Mil/uL (ref 3.87–5.11)
RDW: 13.6 % (ref 11.5–15.5)
WBC: 10.9 10*3/uL — ABNORMAL HIGH (ref 4.0–10.5)

## 2021-03-10 LAB — VITAMIN B12: Vitamin B-12: 311 pg/mL (ref 211–911)

## 2021-03-10 LAB — C-REACTIVE PROTEIN: CRP: <1 mg/dL (ref 0.5–20.0)

## 2021-03-10 LAB — SEDIMENTATION RATE: Sed Rate: 29 mm/hr — ABNORMAL HIGH (ref 0–20)

## 2021-03-10 MED ORDER — PREDNISONE 20 MG PO TABS: 40.0000 mg | ORAL_TABLET | Freq: Every day | ORAL | 0 refills | Status: DC

## 2021-03-10 NOTE — Progress Notes (Signed)
She has been   ? ? ? ?Subjective:  ?Patient ID: Melissa [name], female    DOB: 03/08/1975  Age: 46 y.o. MRN: [id number] ? ?CC:  ?Chief Complaint  ?Patient presents with  ? Urticaria  ?  Happens at night, her hands will start itching and she will use a moisturizer to try and get it to stop itching but does not work. Pt will then wake up covered in hives. Noted that there's some inflammation   ?  ? ? ?HPI  ?This patient arrives today for the above. ? ?She tells me symptoms started about 4 days ago.  She was seen at this office about 2 days ago for the same problem.  At that time she had reported lip swelling, eyelid swelling, and hives.  She tells me that 4 days ago her symptoms actually started with itching palms and eventually progressed into hives.  She was treated 2 days ago with steroid shot and told to continue taking Claritin for symptom management.  She tells me that she has not been taking her Claritin, and did not like the steroid shot to help much because she had additional hives develop later that night.  She denies any additional lip swelling, she also denies throat swelling, wheezing or difficulty breathing.  It was thought that possible trigger was new all of oil that she uses on her skin.  However, she does not believe that this is the trigger any longer. ? ?She tells me she has made no changes to detergent or soaps.  The hives seem to worsen at night.  She is having a hard time sleeping due to the itching.  Per chart review I do see that recommendation was for patient to be referred to allergist if symptoms return. ? ?Past Medical History:  ?Diagnosis Date  ? Anxiety   ? Arthritis   ? Carpal tunnel syndrome   ? Depression   ? GERD (gastroesophageal reflux disease)   ? Hammer toe 04/2013  ? bilateral 4th and 5th   ? Ingrown left big toenail 04/2013  ? Mental disorder   ? Neuromuscular disorder ([place])   ? Post term pregnancy over 40 weeks 09/21/2015  ? SVD (spontaneous vaginal delivery) 09/22/2015  ? UTI  (lower urinary tract infection)   ? ? ? ? ?Family History  ?Problem Relation Age of Onset  ? Lung cancer Father   ? Lung cancer Paternal Grandfather   ? Seizures Mother   ? Arthritis Mother   ? Thyroid disease Maternal Grandmother   ? Heart disease Paternal Grandmother   ?     2 CABG  ? ? ?Social History  ? ?Social History Narrative  ? Not on file  ? ?Social History  ? ?Tobacco Use  ? Smoking status: Never  ? Smokeless tobacco: Never  ?Substance Use Topics  ? Alcohol use: Yes  ?  Comment: occasionally  ? ? ? ?Current Meds  ?Medication Sig  ? azelastine (ASTELIN) 0.1 % nasal spray Place 2 sprays into both nostrils 2 (two) times daily. Use in each nostril as directed  ? buPROPion (WELLBUTRIN XL) 150 MG 24 hr tablet Take 450 mg by mouth daily.  ? Fluocinolone Acetonide Body 0.01 % OIL fluocinolone 0.01 % scalp oil and shower cap ? APPLY TO SCALP AT [facility]  ? fluticasone (FLONASE) 50 MCG/ACT nasal spray Use 2 spray(s) in each nostril once daily  ? lansoprazole (PREVACID SOLUTAB) 30 MG disintegrating tablet Take 1 tablet (30 mg  total) by mouth 2 (two) times daily.  ? naproxen (NAPROSYN) 500 MG tablet Take 1 tablet (500 mg total) by mouth 2 (two) times daily as needed.  ? PARoxetine (PAXIL) 10 MG tablet Take 10 mg by mouth daily.  ? predniSONE (DELTASONE) 20 MG tablet Take 2 tablets (40 mg total) by mouth daily with breakfast.  ? Salicylic Acid 3 % SHAM Use daily as directed for dry scalp  ? selenium sulfide (SELSUN) 2.5 % shampoo USE LOTION EXTERNALLY  TO AFFECTED AREA ONCE DAILY AS NEEDED IRRITATION  ? tranexamic acid (LYSTEDA) 650 MG TABS tablet Take 650 mg by mouth daily.  ? XIIDRA 5 % SOLN INSTILL 1 DROP TWICE DAILY INTO EACH EYE  ? ? ?ROS:  ?Review of Systems  ?Constitutional:  Negative for chills and fever.  ?Respiratory:  Negative for shortness of breath.   ?     (-) throat swelling, (-) wheezing  ?Cardiovascular:  Negative for chest pain.  ?Skin:  Positive for itching and rash.   ? ? ?Objective:  ? ?Today's Vitals: BP 126/78 (BP Location: Left Arm, Patient Position: Sitting, Cuff Size: Large)   Pulse 70   Temp 97.9 ?F (36.6 ?C) (Oral)   Ht '5\' 1"'$  (1.549 m)   Wt 164 lb 3.2 oz (74.5 kg)   SpO2 99%   BMI 31.03 kg/m?  ?Vitals with BMI 03/10/2021 03/08/2021 02/13/2021  ?Height '5\' 1"'$  '5\' 1"'$  '5\' 1"'$   ?Weight 164 lbs 3 oz 161 lbs 10 oz 166 lbs  ?BMI 31.04 30.55 31.38  ?Systolic 671 245 809  ?Diastolic 78 84 88  ?Pulse 70 101 100  ?Some encounter information is confidential and restricted. Go to Review Flowsheets activity to see all data.  ?  ? ?Physical Exam ?Vitals reviewed.  ?Constitutional:   ?   General: She is not in acute distress. ?   Appearance: Normal appearance.  ?HENT:  ?   Head: Normocephalic and atraumatic.  ?Neck:  ?   Vascular: No carotid bruit.  ?Cardiovascular:  ?   Rate and Rhythm: Normal rate and regular rhythm.  ?   Pulses: Normal pulses.  ?   Heart sounds: Normal heart sounds.  ?Pulmonary:  ?   Effort: Pulmonary effort is normal.  ?   Breath sounds: Normal breath sounds.  ?Skin: ?   General: Skin is warm and dry.  ? ?    ?   Comments: Urticarial rash  ?Neurological:  ?   General: No focal deficit present.  ?   Mental Status: She is alert and oriented to person, place, and time.  ?Psychiatric:     ?   Mood and Affect: Mood normal.     ?   Behavior: Behavior normal.     ?   Judgment: Judgment normal.  ? ? ? ? ? ? ? ?Assessment and Plan  ? ?1. Urticaria   ? ? ? ?Plan: ?1.  We will treat with short course of prednisone, recommended patient take Claritin by mouth daily, and Benadryl by mouth at night as needed for itching.  Patient is concerned about possible autoimmune disease or low vitamin B12.  I did discuss I think this is low likelihood however we will get some blood work for evaluation.  We also discussed referral to allergist, because her symptoms are still in an acute phase I feel is reasonable for her to hold off on referral.  However she would like referral for evaluation  so this has been ordered today. ? ? ?  Tests ordered ?Orders Placed This Encounter  ?Procedures  ? C-reactive protein  ? Sedimentation rate  ? B12  ? CBC with Differential/Platelet  ? Ambulatory referral to Allergy  ? ? ? ? ?Meds ordered this encounter  ?Medications  ? predniSONE (DELTASONE) 20 MG tablet  ?  Sig: Take 2 tablets (40 mg total) by mouth daily with breakfast.  ?  Dispense:  10 tablet  ?  Refill:  0  ?  Order Specific Question:   Supervising Provider  ?  Answer:   Binnie Rail [[id number]]  ? ? ?Patient to follow-up in 2 to 3 weeks for close monitoring or sooner as needed. ? ?Ailene Ards, NP ? ?

## 2021-03-10 NOTE — Patient Instructions (Addendum)
Take Claritin '10mg'$  by mouth every day, take benadryl '25mg'$  by mouth at night as needed for itching (these are antihistamines) ?Take prednisone 2 tablets by mouth every morning with food for 5 days. Do not take any NSAIDS (naproxen, advil, aleve, ibuprofen) while taking prednisone. If you have any stomach pain or notice blood in your stool, stop the prednisone and contact out office.  ?

## 2021-03-24 ENCOUNTER — Ambulatory Visit: Payer: Medicare Other | Admitting: Nurse Practitioner

## 2021-03-24 NOTE — Progress Notes (Signed)
?Charlann Boxer D.O. ?[facility] ?[place] ?Phone: [phone] ?Subjective:   ?I, Vilma Meckel, am serving as a Education administrator for Dr. Hulan Saas. ?This visit occurred during the SARS-CoV-2 public health emergency.  Safety protocols were in place, including screening questions prior to the visit, additional usage of staff PPE, and extensive cleaning of exam room while observing appropriate contact time as indicated for disinfecting solutions.  ? ?I'm seeing this patient by the request  of:  Horald Pollen, MD ? ?CC: Back pain, neck pain, forearm pain ? ?[id number]  ?Melissa [name] is a 46 y.o. female coming in with complaint of back and neck pain. OMT 02/13/2021. Patient states right arm hurts (forearm) wants injection. No other complaints. ? ?Medications patient has been prescribed: None ? ?Taking: ? ? ?  ? ? ? ? ?Reviewed prior external information including notes and imaging from previsou exam, outside providers and external EMR if available.  ? ?As well as notes that were available from care everywhere and other healthcare systems. ? ?Past medical history, social, surgical and family history all reviewed in electronic medical record.  No pertanent information unless stated regarding to the chief complaint.  ? ?Past Medical History:  ?Diagnosis Date  ? Anxiety   ? Arthritis   ? Carpal tunnel syndrome   ? Depression   ? GERD (gastroesophageal reflux disease)   ? Hammer toe 04/2013  ? bilateral 4th and 5th   ? Ingrown left big toenail 04/2013  ? Mental disorder   ? Neuromuscular disorder ([place])   ? Post term pregnancy over 40 weeks 09/21/2015  ? SVD (spontaneous vaginal delivery) 09/22/2015  ? UTI (lower urinary tract infection)   ?  ?Allergies  ?Allergen Reactions  ? Bactrim [Sulfamethoxazole-Trimethoprim]   ? ? ? ?Review of Systems: ? No headache, visual changes, nausea, vomiting, diarrhea, constipation, dizziness, abdominal pain, skin rash, fevers, chills, night sweats,  weight loss, swollen lymph nodes, body aches, joint swelling, chest pain, shortness of breath, mood changes. POSITIVE muscle aches ? ?Objective  ?Blood pressure (!) 138/96, pulse 98, height '5\' 1"'$  (1.549 m), weight 165 lb (74.8 kg), SpO2 98 %. ?  ?General: No apparent distress alert and oriented x3 mood and affect normal, dressed appropriately.  ?HEENT: Pupils equal, extraocular movements intact  ?Respiratory: Patient's speak in full sentences and does not appear short of breath  ?Cardiovascular: No lower extremity edema, non tender, no erythema  ?Neck exam does have loss of lordosis.  Limited sidebending bilaterally secondary to pain and voluntary guarding noted.  Negative Spurling's at the moment.  Does lack the last 5 degrees of extension present ?Right forearm exam shows the patient does have tenderness to palpation over the lateral epicondylar area.  Patient does have some mild pain with resisted wrist extension.  Good grip strength noted.  Full range of the elbow otherwise. ? ? ?After verbal consent patient was prepped with alcohol swab and with a 25-gauge half inch needle injected into the common extensor tendon at the origin of the lateral epicondylar region.  A total of 0.5 cc of 0.5% Marcaine and 0.5 cc of Kenalog 40 mg/mL use.  No blood loss.  Postinjection instructions given ? ?Osteopathic findings ? ?C2 flexed rotated and side bent right ?C7 flexed rotated and side bent left ?T5 extended rotated and side bent right inhaled rib ?T6 extended rotated and side bent left ?L2 flexed rotated and side bent right ?Sacrum right on right ? ? ? ? ?  ?  Assessment and Plan: ? ?Right lateral epicondylitis ?Chronic problem with exacerbation.  Discussed the ergonomics of her think will be beneficial.  Discussed which activities to do and which ones to avoid, increase activity slowly.  Follow-up again in 6 weeks  ? ?Cervical radiculopathy ?Known cervical radiculopathy.  Discussed icing regimen and home exercises, discussed  which activities to do which ones to avoid, increase activity slowly.  Follow-up with me again in 6 to 8 weeks.  ? ?Nonallopathic problems ? ?Decision today to treat with OMT was based on Physical Exam ? ?After verbal consent patient was treated with HVLA, ME, FPR techniques in cervical, rib, thoracic, lumbar, and sacral  areas ? ?Patient tolerated the procedure well with improvement in symptoms ? ?Patient given exercises, stretches and lifestyle modifications ? ?See medications in patient instructions if given ? ?Patient will follow up in 4-8 weeks ? ?  ? ? ?The above documentation has been reviewed and is accurate and complete Lyndal Pulley, DO ? ? ? ?  ? ? Note: This dictation was prepared with Dragon dictation along with smaller phrase technology. Any transcriptional errors that result from this process are unintentional.    ?  ?  ? ?

## 2021-03-27 ENCOUNTER — Ambulatory Visit (INDEPENDENT_AMBULATORY_CARE_PROVIDER_SITE_OTHER): Payer: Medicare Other | Admitting: Family Medicine

## 2021-03-27 ENCOUNTER — Other Ambulatory Visit: Payer: Self-pay

## 2021-03-27 VITALS — BP 138/96 | HR 98 | Ht 61.0 in | Wt 165.0 lb

## 2021-03-27 DIAGNOSIS — M9902 Segmental and somatic dysfunction of thoracic region: Secondary | ICD-10-CM

## 2021-03-27 DIAGNOSIS — M9908 Segmental and somatic dysfunction of rib cage: Secondary | ICD-10-CM | POA: Diagnosis not present

## 2021-03-27 DIAGNOSIS — M7711 Lateral epicondylitis, right elbow: Secondary | ICD-10-CM

## 2021-03-27 DIAGNOSIS — M5412 Radiculopathy, cervical region: Secondary | ICD-10-CM

## 2021-03-27 DIAGNOSIS — M9901 Segmental and somatic dysfunction of cervical region: Secondary | ICD-10-CM

## 2021-03-27 DIAGNOSIS — M9903 Segmental and somatic dysfunction of lumbar region: Secondary | ICD-10-CM | POA: Diagnosis not present

## 2021-03-27 DIAGNOSIS — M9904 Segmental and somatic dysfunction of sacral region: Secondary | ICD-10-CM | POA: Diagnosis not present

## 2021-03-27 NOTE — Patient Instructions (Addendum)
DHEA '50mg'$  for 4 weeks ?Lift everything underhand ?Take a little easy next week ?Call if any redness of swelling ?See you again in 6-8 weeks ?

## 2021-03-27 NOTE — Assessment & Plan Note (Signed)
Known cervical radiculopathy.  Discussed icing regimen and home exercises, discussed which activities to do which ones to avoid, increase activity slowly.  Follow-up with me again in 6 to 8 weeks. ?

## 2021-03-27 NOTE — Assessment & Plan Note (Signed)
Chronic problem with exacerbation.  Discussed the ergonomics of her think will be beneficial.  Discussed which activities to do and which ones to avoid, increase activity slowly.  Follow-up again in 6 weeks  ?

## 2021-04-10 ENCOUNTER — Telehealth: Payer: Self-pay | Admitting: Gastroenterology

## 2021-04-10 NOTE — Telephone Encounter (Signed)
Inbound call from patient stating that she is having a really bad cough at night and is seeking advice if she can be proscribed anything to help. Please advise.  ?

## 2021-04-11 ENCOUNTER — Other Ambulatory Visit: Payer: Self-pay | Admitting: Family Medicine

## 2021-04-11 NOTE — Telephone Encounter (Signed)
Patient called in with complaints of a cough for the last two weeks unrelieved by mucinex & robitussin. She was last seen in office with Janett Billow, [place] in November 2022 for dysphagia. She is not currently having any issues swallowing. Patient states that last time this happened our office prescribed her a medication to help with the cough. Will route to PA.  ? ? ?

## 2021-04-11 NOTE — Telephone Encounter (Signed)
Spoke with patient & advised that she call her PCP regarding cough, and that our office had never prescribed any cough medication for her.  ?

## 2021-04-20 DIAGNOSIS — R350 Frequency of micturition: Secondary | ICD-10-CM | POA: Diagnosis not present

## 2021-05-09 NOTE — Progress Notes (Deleted)
[facility] Phone: [phone] Subjective:    I'm seeing this patient by the request  of:  Sagardia, Ines Bloomer, MD  CC:   [id number]  Melissa [name] is a 46 y.o. female coming in with complaint of back and neck pain. OMT 03/27/2021. Also f/u for R lateral epi. Patient states   Medications patient has been prescribed: None  Taking:         Reviewed prior external information including notes and imaging from previsou exam, outside providers and external EMR if available.   As well as notes that were available from care everywhere and other healthcare systems.  Past medical history, social, surgical and family history all reviewed in electronic medical record.  No pertanent information unless stated regarding to the chief complaint.   Past Medical History:  Diagnosis Date   Anxiety    Arthritis    Carpal tunnel syndrome    Depression    GERD (gastroesophageal reflux disease)    Hammer toe 04/2013   bilateral 4th and 5th    Ingrown left big toenail 04/2013   Mental disorder    Neuromuscular disorder ([place])    Post term pregnancy over 40 weeks 09/21/2015   SVD (spontaneous vaginal delivery) 09/22/2015   UTI (lower urinary tract infection)     Allergies  Allergen Reactions   Bactrim [Sulfamethoxazole-Trimethoprim]      Review of Systems:  No headache, visual changes, nausea, vomiting, diarrhea, constipation, dizziness, abdominal pain, skin rash, fevers, chills, night sweats, weight loss, swollen lymph nodes, body aches, joint swelling, chest pain, shortness of breath, mood changes. POSITIVE muscle aches  Objective  There were no vitals taken for this visit.   General: No apparent distress alert and oriented x3 mood and affect normal, dressed appropriately.  HEENT: Pupils equal, extraocular movements intact  Respiratory: Patient's speak in full sentences and does not appear short of breath   Cardiovascular: No lower extremity edema, non tender, no erythema  Neuro: Cranial nerves II through XII are intact, neurovascularly intact in all extremities with 2+ DTRs and 2+ pulses.  Gait normal with good balance and coordination.  MSK:  Non tender with full range of motion and good stability and symmetric strength and tone of shoulders, elbows, wrist, hip, knee and ankles bilaterally.  Back - Normal skin, Spine with normal alignment and no deformity.  No tenderness to vertebral process palpation.  Paraspinous muscles are not tender and without spasm.   Range of motion is full at neck and lumbar sacral regions  Osteopathic findings  C2 flexed rotated and side bent right C6 flexed rotated and side bent left T3 extended rotated and side bent right inhaled rib T9 extended rotated and side bent left L2 flexed rotated and side bent right Sacrum right on right       Assessment and Plan:    Nonallopathic problems  Decision today to treat with OMT was based on Physical Exam  After verbal consent patient was treated with HVLA, ME, FPR techniques in cervical, rib, thoracic, lumbar, and sacral  areas  Patient tolerated the procedure well with improvement in symptoms  Patient given exercises, stretches and lifestyle modifications  See medications in patient instructions if given  Patient will follow up in 4-8 weeks      The above documentation has been reviewed and is accurate and complete [name]       Note: This dictation was prepared with Sales executive  along with smaller phrase technology. Any transcriptional errors that result from this process are unintentional.

## 2021-05-10 ENCOUNTER — Ambulatory Visit: Payer: Medicare Other | Admitting: Family Medicine

## 2021-05-17 ENCOUNTER — Other Ambulatory Visit: Payer: Self-pay | Admitting: Family Medicine

## 2021-06-07 NOTE — Progress Notes (Deleted)
Melissa [name] Phone: [phone] Subjective:    I'm seeing this patient by the request  of:  Sagardia, Ines Bloomer, MD  CC:   [id number]  Melissa [name] is a 46 y.o. female coming in with complaint of back and neck pain. OMT 03/27/2021. Also f/u for R lateral epi. Patient states   Medications patient has been prescribed: None  Taking:         Reviewed prior external information including notes and imaging from previsou exam, outside providers and external EMR if available.   As well as notes that were available from care everywhere and other healthcare systems.  Past medical history, social, surgical and family history all reviewed in electronic medical record.  No pertanent information unless stated regarding to the chief complaint.   Past Medical History:  Diagnosis Date   Anxiety    Arthritis    Carpal tunnel syndrome    Depression    GERD (gastroesophageal reflux disease)    Hammer toe 04/2013   bilateral 4th and 5th    Ingrown left big toenail 04/2013   Mental disorder    Neuromuscular disorder ([place])    Post term pregnancy over 40 weeks 09/21/2015   SVD (spontaneous vaginal delivery) 09/22/2015   UTI (lower urinary tract infection)     Allergies  Allergen Reactions   Bactrim [Sulfamethoxazole-Trimethoprim]      Review of Systems:  No headache, visual changes, nausea, vomiting, diarrhea, constipation, dizziness, abdominal pain, skin rash, fevers, chills, night sweats, weight loss, swollen lymph nodes, body aches, joint swelling, chest pain, shortness of breath, mood changes. POSITIVE muscle aches  Objective  There were no vitals taken for this visit.   General: No apparent distress alert and oriented x3 mood and affect normal, dressed appropriately.  HEENT: Pupils equal, extraocular movements intact  Respiratory: Patient's speak in full sentences and does not appear short of breath   Cardiovascular: No lower extremity edema, non tender, no erythema  Neuro: Cranial nerves II through XII are intact, neurovascularly intact in all extremities with 2+ DTRs and 2+ pulses.  Gait normal with good balance and coordination.  MSK:  Non tender with full range of motion and good stability and symmetric strength and tone of shoulders, elbows, wrist, hip, knee and ankles bilaterally.  Back - Normal skin, Spine with normal alignment and no deformity.  No tenderness to vertebral process palpation.  Paraspinous muscles are not tender and without spasm.   Range of motion is full at neck and lumbar sacral regions  Osteopathic findings  C2 flexed rotated and side bent right C6 flexed rotated and side bent left T3 extended rotated and side bent right inhaled rib T9 extended rotated and side bent left L2 flexed rotated and side bent right Sacrum right on right       Assessment and Plan:    Nonallopathic problems  Decision today to treat with OMT was based on Physical Exam  After verbal consent patient was treated with HVLA, ME, FPR techniques in cervical, rib, thoracic, lumbar, and sacral  areas  Patient tolerated the procedure well with improvement in symptoms  Patient given exercises, stretches and lifestyle modifications  See medications in patient instructions if given  Patient will follow up in 4-8 weeks      The above documentation has been reviewed and is accurate and complete [name]       Note: This dictation was prepared with Sales executive  along with smaller phrase technology. Any transcriptional errors that result from this process are unintentional.

## 2021-06-08 ENCOUNTER — Ambulatory Visit: Payer: Self-pay | Admitting: Family Medicine

## 2021-06-09 ENCOUNTER — Ambulatory Visit
Admission: EM | Admit: 2021-06-09 | Discharge: 2021-06-09 | Disposition: A | Discharge status: Home / Self Care | Payer: Medicare Other | Attending: Emergency Medicine | Admitting: Emergency Medicine

## 2021-06-09 DIAGNOSIS — S61211A Laceration without foreign body of left index finger without damage to nail, initial encounter: Secondary | ICD-10-CM

## 2021-06-09 DIAGNOSIS — Z23 Encounter for immunization: Secondary | ICD-10-CM

## 2021-06-09 MED ORDER — TETANUS-DIPHTH-ACELL PERTUSSIS 5-2.5-18.5 LF-MCG/0.5 IM SUSY
0.5000 mL | PREFILLED_SYRINGE | Freq: Once | INTRAMUSCULAR | Status: AC
  Administered 2021-06-09: 0.5 mL via INTRAMUSCULAR

## 2021-06-09 MED ORDER — CEPHALEXIN 500 MG PO CAPS: 1000.0000 mg | ORAL_CAPSULE | Freq: Two times a day (BID) | ORAL | 0 refills | Status: AC

## 2021-06-09 MED ORDER — IBUPROFEN 600 MG PO TABS: 600.0000 mg | ORAL_TABLET | Freq: Four times a day (QID) | ORAL | 0 refills | Status: DC | PRN

## 2021-06-09 NOTE — Discharge Instructions (Addendum)
Keep this clean and dry for as long as you can.  Steri-Strips should come off in 10 days.  Finish the antibiotics, even if you feel better.  Take 600 mg of ibuprofen with 1000 mg of Tylenol together 3-4 times a day as needed for pain.  Return here for any signs of infection

## 2021-06-09 NOTE — ED Notes (Signed)
Cleaned wound with dermal cleaner spray and gauze

## 2021-06-09 NOTE — ED Triage Notes (Signed)
Pt present laceration to the left hand/index finger. Pt was working with some gardening tools when she cut her hand. She will need a up to date tetanus.

## 2021-06-09 NOTE — ED Provider Notes (Signed)
HPI  SUBJECTIVE:  Melissa [name] is a right-handed 46 y.o. female who presents with a laceration to her left index finger sustained immediately prior to arrival on brand-new electric pruning shears.  She attempted to superglue it, but came in because it would not stop bleeding.  No numbness or tingling, limitation of motion, weakness, foreign body sensation.  She reports some pain.  She tried briefly washing it with soap and water, superglue.  No aggravating or alleviating factors.  She has no past medical history.  She is not on any anticoagulants or antiplatelets.  Tetanus is not up-to-date.    Past Medical History:  Diagnosis Date   Anxiety    Arthritis    Carpal tunnel syndrome    Depression    GERD (gastroesophageal reflux disease)    Hammer toe 04/2013   bilateral 4th and 5th    Ingrown left big toenail 04/2013   Mental disorder    Neuromuscular disorder ([place])    Post term pregnancy over 40 weeks 09/21/2015   SVD (spontaneous vaginal delivery) 09/22/2015   UTI (lower urinary tract infection)     Past Surgical History:  Procedure Laterality Date   BREAST LIFT Bilateral    CARPAL TUNNEL RELEASE     CORRECTION HAMMER TOE Bilateral    CRYOABLATION  2004   CYSTOSCOPY WITH HYDRODISTENSION AND BIOPSY  07/28/2004   with urethral dilatation   DILATION AND CURETTAGE OF UTERUS     DILATION AND EVACUATION  12/21/2011   Procedure: DILATATION AND EVACUATION;  Surgeon: Terrance Mass, MD;  Location: [facility];  Service: Gynecology;  Laterality: N/A;  first trimester    ESOPHAGEAL MANOMETRY N/A 02/10/2021   Procedure: ESOPHAGEAL MANOMETRY (EM);  Surgeon: Mauri Pole, MD;  Location: [facility];  Service: Endoscopy;  Laterality: N/A;   HAMMER TOE SURGERY Bilateral 04/20/2013   Procedure: BILATERAL HAMMER TOE REPAIR AND EXCISION NAIL PERMANENT LEFT FIRST;  Surgeon: Harriet Masson, DPM;  Location: [facility];  Service: Podiatry;  Laterality:  Bilateral;  Hammer toe repair fourth and fifth toes bilateral feet with screw placement fourth toes and partial nail excision left great toe   HYSTEROSALPINGOGRAM     REDUCTION MAMMAPLASTY Bilateral 2020   TOE SURGERY     ingrown toenail   WISDOM TOOTH EXTRACTION      Family History  Problem Relation Age of Onset   Lung cancer Father    Lung cancer Paternal Grandfather    Seizures Mother    Arthritis Mother    Thyroid disease Maternal Grandmother    Heart disease Paternal Grandmother        2 CABG    Social History   Tobacco Use   Smoking status: Never   Smokeless tobacco: Never  Vaping Use   Vaping Use: Never used  Substance Use Topics   Alcohol use: Yes    Comment: occasionally   Drug use: No    No current facility-administered medications for this encounter.  Current Outpatient Medications:    cephALEXin (KEFLEX) 500 MG capsule, Take 2 capsules (1,000 mg total) by mouth 2 (two) times daily for 5 days., Disp: 20 capsule, Rfl: 0   ibuprofen (ADVIL) 600 MG tablet, Take 1 tablet (600 mg total) by mouth every 6 (six) hours as needed., Disp: 30 tablet, Rfl: 0   azelastine (ASTELIN) 0.1 % nasal spray, Place 2 sprays into both nostrils 2 (two) times daily. Use in each nostril as directed, Disp: 30 mL, Rfl: 12  buPROPion (WELLBUTRIN XL) 150 MG 24 hr tablet, Take 450 mg by mouth daily., Disp: , Rfl:    Fluocinolone Acetonide Body 0.01 % OIL, fluocinolone 0.01 % scalp oil and shower cap  APPLY TO SCALP AT [facility] UPON AWAKENING, Disp: , Rfl:    fluticasone (FLONASE) 50 MCG/ACT nasal spray, Use 2 spray(s) in each nostril once daily, Disp: 16 g, Rfl: 11   lansoprazole (PREVACID SOLUTAB) 30 MG disintegrating tablet, Take 1 tablet (30 mg total) by mouth 2 (two) times daily., Disp: 180 tablet, Rfl: 3   PARoxetine (PAXIL) 10 MG tablet, Take 10 mg by mouth daily., Disp: , Rfl:    predniSONE (DELTASONE) 20 MG tablet, Take 2 tablets (40 mg total) by mouth daily with breakfast.,  Disp: 10 tablet, Rfl: 0   Salicylic Acid 3 % SHAM, Use daily as directed for dry scalp, Disp: 236 mL, Rfl: 1   selenium sulfide (SELSUN) 2.5 % shampoo, USE LOTION EXTERNALLY  TO AFFECTED AREA ONCE DAILY AS NEEDED IRRITATION, Disp: 120 mL, Rfl: 0   tranexamic acid (LYSTEDA) 650 MG TABS tablet, Take 650 mg by mouth daily., Disp: , Rfl:    XIIDRA 5 % SOLN, INSTILL 1 DROP TWICE DAILY INTO EACH EYE, Disp: , Rfl:   Allergies  Allergen Reactions   Bactrim [Sulfamethoxazole-Trimethoprim]      ROS  As noted in HPI.   Physical Exam  BP (!) 141/86 (BP Location: Left Arm)   Pulse (!) 108   Temp 98 F (36.7 C) (Oral)   Resp 18   SpO2 95%   Constitutional: Well developed, well nourished, no acute distress Eyes:  EOMI, conjunctiva normal bilaterally HENT: Normocephalic, atraumatic,mucus membranes moist Respiratory: Normal inspiratory effort Cardiovascular: Normal rate GI: nondistended skin: 1 cm V-shaped laceration distal left index finger, 0.5 cm V shaped laceration proximal to this.   Musculoskeletal: Left index finger: Two-point discrimination intact.  FDP/FDS intact against resistance.  Patient able to extend finger against resistance Neurologic: Alert & oriented x 3, no focal neuro deficits Psychiatric: Speech and behavior appropriate   ED Course   Medications  Tdap (BOOSTRIX) injection 0.5 mL (0.5 mLs Intramuscular Given 06/09/21 1846)    No orders of the defined types were placed in this encounter.   No results found for this or any previous visit (from the past 24 hour(s)). No results found.  ED Clinical Impression  1. Laceration of left index finger without foreign body without damage to nail, initial encounter      ED Assessment/Plan  We discussed sutures versus Steri-Strips, and patient has opted for Steri-Strips.  Procedure note: Wound was explored with adequate hemostasis, no debris, foreign body, tendon, neurovascular involvement noted.  Wound was thoroughly  cleaned and irrigated with 150 cc of sterile saline/chlorhexidine mix.  It was then rinsed.  It was then cleaned with chlorhexidine.  Applied benzoin and 3 Steri-Strips with close approximation of wound edges.  Placed dressing and splint.  Patient tolerated procedure well.  Home with Keflex, Tylenol/ibuprofen.  Updating tetanus.  Follow-up care for any signs of infection  Discussed MDM, treatment plan, and plan for follow-up with patient. Discussed sn/sx that should prompt return to the ED. patient agrees with plan.   Meds ordered this encounter  Medications   Tdap (BOOSTRIX) injection 0.5 mL   cephALEXin (KEFLEX) 500 MG capsule    Sig: Take 2 capsules (1,000 mg total) by mouth 2 (two) times daily for 5 days.    Dispense:  20 capsule  Refill:  0   ibuprofen (ADVIL) 600 MG tablet    Sig: Take 1 tablet (600 mg total) by mouth every 6 (six) hours as needed.    Dispense:  30 tablet    Refill:  0      *This clinic note was created using [employer]. Therefore, there may be occasional mistakes despite careful proofreading.  ?    Melynda Ripple, MD 06/09/21 [phone]

## 2021-06-22 ENCOUNTER — Ambulatory Visit: Payer: Medicare Other | Admitting: Emergency Medicine

## 2021-06-28 ENCOUNTER — Other Ambulatory Visit: Payer: Self-pay | Admitting: Emergency Medicine

## 2021-06-28 ENCOUNTER — Ambulatory Visit: Payer: Medicare Other | Admitting: Emergency Medicine

## 2021-06-28 ENCOUNTER — Ambulatory Visit (INDEPENDENT_AMBULATORY_CARE_PROVIDER_SITE_OTHER): Payer: Medicare Other | Admitting: Emergency Medicine

## 2021-06-28 ENCOUNTER — Encounter: Payer: Self-pay | Admitting: Emergency Medicine

## 2021-06-28 ENCOUNTER — Telehealth: Payer: Self-pay | Admitting: *Deleted

## 2021-06-28 VITALS — BP 152/94 | HR 123 | Temp 99.1°F | Ht 61.0 in | Wt 169.1 lb

## 2021-06-28 DIAGNOSIS — G8929 Other chronic pain: Secondary | ICD-10-CM | POA: Diagnosis not present

## 2021-06-28 DIAGNOSIS — M25562 Pain in left knee: Secondary | ICD-10-CM | POA: Diagnosis not present

## 2021-06-28 DIAGNOSIS — E669 Obesity, unspecified: Secondary | ICD-10-CM | POA: Diagnosis not present

## 2021-06-28 DIAGNOSIS — I10 Essential (primary) hypertension: Secondary | ICD-10-CM | POA: Diagnosis not present

## 2021-06-28 DIAGNOSIS — M25561 Pain in right knee: Secondary | ICD-10-CM | POA: Diagnosis not present

## 2021-06-28 DIAGNOSIS — R61 Generalized hyperhidrosis: Secondary | ICD-10-CM

## 2021-06-28 MED ORDER — LISINOPRIL 20 MG PO TABS: 20.0000 mg | ORAL_TABLET | Freq: Every day | ORAL | 3 refills | Status: DC

## 2021-06-28 MED ORDER — SAXENDA 18 MG/3ML ~~LOC~~ SOPN: 1.2000 mg | PEN_INJECTOR | Freq: Every day | SUBCUTANEOUS | 3 refills | Status: DC

## 2021-06-28 NOTE — Telephone Encounter (Signed)
PA submitted for Saxenda, awaiting response  Key: [id number]

## 2021-06-28 NOTE — Patient Instructions (Signed)
Start lisinopril 20 mg daily. Monitor blood pressure readings at home several times a day for the next several weeks and keep a log. Call the office if numbers persistently elevated.  Follow-up in 3 months.  Hypertension, Adult High blood pressure (hypertension) is when the force of blood pumping through the arteries is too strong. The arteries are the blood vessels that carry blood from the heart throughout the body. Hypertension forces the heart to work harder to pump blood and may cause arteries to become narrow or stiff. Untreated or uncontrolled hypertension can lead to a heart attack, heart failure, a stroke, kidney disease, and other problems. A blood pressure reading consists of a higher number over a lower number. Ideally, your blood pressure should be below 120/80. The first ("top") number is called the systolic pressure. It is a measure of the pressure in your arteries as your heart beats. The second ("bottom") number is called the diastolic pressure. It is a measure of the pressure in your arteries as the heart relaxes. What are the causes? The exact cause of this condition is not known. There are some conditions that result in high blood pressure. What increases the risk? Certain factors may make you more likely to develop high blood pressure. Some of these risk factors are under your control, including: Smoking. Not getting enough exercise or physical activity. Being overweight. Having too much fat, sugar, calories, or salt (sodium) in your diet. Drinking too much alcohol. Other risk factors include: Having a personal history of heart disease, diabetes, high cholesterol, or kidney disease. Stress. Having a family history of high blood pressure and high cholesterol. Having obstructive sleep apnea. Age. The risk increases with age. What are the signs or symptoms? High blood pressure may not cause symptoms. Very high blood pressure (hypertensive crisis) may cause: Headache. Fast  or irregular heartbeats (palpitations). Shortness of breath. Nosebleed. Nausea and vomiting. Vision changes. Severe chest pain, dizziness, and seizures. How is this diagnosed? This condition is diagnosed by measuring your blood pressure while you are seated, with your arm resting on a flat surface, your legs uncrossed, and your feet flat on the floor. The cuff of the blood pressure monitor will be placed directly against the skin of your upper arm at the level of your heart. Blood pressure should be measured at least twice using the same arm. Certain conditions can cause a difference in blood pressure between your right and left arms. If you have a high blood pressure reading during one visit or you have normal blood pressure with other risk factors, you may be asked to: Return on a different day to have your blood pressure checked again. Monitor your blood pressure at home for 1 week or longer. If you are diagnosed with hypertension, you may have other blood or imaging tests to help your health care provider understand your overall risk for other conditions. How is this treated? This condition is treated by making healthy lifestyle changes, such as eating healthy foods, exercising more, and reducing your alcohol intake. You may be referred for counseling on a healthy diet and physical activity. Your health care provider may prescribe medicine if lifestyle changes are not enough to get your blood pressure under control and if: Your systolic blood pressure is above 130. Your diastolic blood pressure is above 80. Your personal target blood pressure may vary depending on your medical conditions, your age, and other factors. Follow these instructions at home: Eating and drinking  Eat a diet that is  high in fiber and potassium, and low in sodium, added sugar, and fat. An example of this eating plan is called the DASH diet. DASH stands for Dietary Approaches to Stop Hypertension. To eat this way: Eat  plenty of fresh fruits and vegetables. Try to fill one half of your plate at each meal with fruits and vegetables. Eat whole grains, such as whole-wheat pasta, brown rice, or whole-grain bread. Fill about one fourth of your plate with whole grains. Eat or drink low-fat dairy products, such as skim milk or low-fat yogurt. Avoid fatty cuts of meat, processed or cured meats, and poultry with skin. Fill about one fourth of your plate with lean proteins, such as fish, chicken without skin, beans, eggs, or tofu. Avoid pre-made and processed foods. These tend to be higher in sodium, added sugar, and fat. Reduce your daily sodium intake. Many people with hypertension should eat less than 1,500 mg of sodium a day. Do not drink alcohol if: Your health care provider tells you not to drink. You are pregnant, may be pregnant, or are planning to become pregnant. If you drink alcohol: Limit how much you have to: 0-1 drink a day for women. 0-2 drinks a day for men. Know how much alcohol is in your drink. In the [place] one drink equals one 12 oz bottle of beer (355 mL), one 5 oz glass of wine (148 mL), or one 1 oz glass of hard liquor (44 mL). Lifestyle  Work with your health care provider to maintain a healthy body weight or to lose weight. Ask what an ideal weight is for you. Get at least 30 minutes of exercise that causes your heart to beat faster (aerobic exercise) most days of the week. Activities may include walking, swimming, or biking. Include exercise to strengthen your muscles (resistance exercise), such as Pilates or lifting weights, as part of your weekly exercise routine. Try to do these types of exercises for 30 minutes at least 3 days a week. Do not use any products that contain nicotine or tobacco. These products include cigarettes, chewing tobacco, and vaping devices, such as e-cigarettes. If you need help quitting, ask your health care provider. Monitor your blood pressure at home as told by  your health care provider. Keep all follow-up visits. This is important. Medicines Take over-the-counter and prescription medicines only as told by your health care provider. Follow directions carefully. Blood pressure medicines must be taken as prescribed. Do not skip doses of blood pressure medicine. Doing this puts you at risk for problems and can make the medicine less effective. Ask your health care provider about side effects or reactions to medicines that you should watch for. Contact a health care provider if you: Think you are having a reaction to a medicine you are taking. Have headaches that keep coming back (recurring). Feel dizzy. Have swelling in your ankles. Have trouble with your vision. Get help right away if you: Develop a severe headache or confusion. Have unusual weakness or numbness. Feel faint. Have severe pain in your chest or abdomen. Vomit repeatedly. Have trouble breathing. These symptoms may be an emergency. Get help right away. Call 911. Do not wait to see if the symptoms will go away. Do not drive yourself to the hospital. Summary Hypertension is when the force of blood pumping through your arteries is too strong. If this condition is not controlled, it may put you at risk for serious complications. Your personal target blood pressure may vary depending on your medical  conditions, your age, and other factors. For most people, a normal blood pressure is less than 120/80. Hypertension is treated with lifestyle changes, medicines, or a combination of both. Lifestyle changes include losing weight, eating a healthy, low-sodium diet, exercising more, and limiting alcohol. This information is not intended to replace advice given to you by your health care provider. Make sure you discuss any questions you have with your health care provider. Document Revised: 10/25/2020 Document Reviewed: 10/25/2020 Elsevier Patient Education  [place].

## 2021-06-28 NOTE — Assessment & Plan Note (Signed)
Most likely menopausal symptoms.

## 2021-06-28 NOTE — Assessment & Plan Note (Signed)
Elevated blood pressure readings during the last few office visits with different doctors. Persistently elevated. We will start lisinopril 20 mg daily. Advised to monitor blood pressure readings at home several times a day for the next several weeks and keep a log. Dietary approaches to stop hypertension discussed. Follow-up in 3 months.

## 2021-06-28 NOTE — Telephone Encounter (Signed)
Call pharmacy and ask what alternative is covered by insurance.

## 2021-06-28 NOTE — Assessment & Plan Note (Signed)
Diet and nutrition discussed.  Advised to decrease amount of daily carbohydrate intake and daily calories. Wants to start medication.  May benefit from Saxenda 1.2 mg daily.

## 2021-06-28 NOTE — Assessment & Plan Note (Signed)
Benign physical findings.  Not affecting quality of life too much. Advised to take Tylenol as needed. May need orthopedic evaluation in the future.

## 2021-06-28 NOTE — Progress Notes (Signed)
Melissa [name] 46 y.o.   Chief Complaint  Patient presents with   Weight Gain    Concern about her weight , pt is wanting something for weight loss,( saxenda)     Knee Pain    Bilateral knee pain     HISTORY OF PRESENT ILLNESS: This is a 46 y.o. female complaining of several things:  #1 persistently elevated blood pressure readings.  On no medications. #2 menopausal weight gain.  Inquiring about Saxenda. #3 chronic bilateral knee pains No other complaints or medical concerns Exercises regularly and eats well.   Knee Pain      Prior to Admission medications   Medication Sig Start Date End Date Taking? Authorizing Provider  azelastine (ASTELIN) 0.1 % nasal spray Place 2 sprays into both nostrils 2 (two) times daily. Use in each nostril as directed 11/05/19  Yes Marrian Salvage, FNP  buPROPion (WELLBUTRIN XL) 150 MG 24 hr tablet Take 450 mg by mouth daily. 02/27/21  Yes [provider]  Fluocinolone Acetonide Body 0.01 % OIL fluocinolone 0.01 % scalp oil and shower cap  APPLY TO SCALP AT [facility] UPON AWAKENING   Yes [provider]  fluticasone (FLONASE) 50 MCG/ACT nasal spray Use 2 spray(s) in each nostril once daily 10/28/19  Yes Marrian Salvage, FNP  ibuprofen (ADVIL) 600 MG tablet Take 1 tablet (600 mg total) by mouth every 6 (six) hours as needed. 06/09/21  Yes Melynda Ripple, MD  lansoprazole (PREVACID SOLUTAB) 30 MG disintegrating tablet Take 1 tablet (30 mg total) by mouth 2 (two) times daily. 11/29/20  Yes Zehr, Laban Emperor, PA-C  Liraglutide -Weight Management (SAXENDA) 18 MG/3ML SOPN Inject 1.2 mg into the skin daily. 06/28/21  Yes Govani Radloff, Ines Bloomer, MD  lisinopril (ZESTRIL) 20 MG tablet Take 1 tablet (20 mg total) by mouth daily. 06/28/21  Yes Elfriede Bonini, Ines Bloomer, MD  PARoxetine (PAXIL) 10 MG tablet Take 10 mg by mouth daily. 01/24/21  Yes [provider]  predniSONE (DELTASONE) 20 MG tablet Take 2 tablets (40 mg  total) by mouth daily with breakfast. 03/10/21  Yes Ailene Ards, NP  Salicylic Acid 3 % SHAM Use daily as directed for dry scalp 09/23/17  Yes Marrian Salvage, FNP  selenium sulfide (SELSUN) 2.5 % shampoo USE LOTION EXTERNALLY  TO AFFECTED AREA ONCE DAILY AS NEEDED IRRITATION 02/15/20  Yes Marrian Salvage, FNP  tranexamic acid (LYSTEDA) 650 MG TABS tablet Take 650 mg by mouth daily. 01/04/21  Yes [provider]  XIIDRA 5 % SOLN INSTILL 1 DROP TWICE DAILY INTO [facility] EYE 02/02/20  Yes [provider]    Allergies  Allergen Reactions   Bactrim [Sulfamethoxazole-Trimethoprim]     Patient Active Problem List   Diagnosis Date Noted   Weight gain 02/08/2021   Family history of thyroid disease 02/08/2021   Gastroesophageal reflux disease 11/29/2020   Dysphagia 25/95/6387   History of Helicobacter pylori infection 11/29/2020   De Quervain's disease (radial styloid tenosynovitis) 03/31/2020   Nonallopathic lesion of lumbosacral region 03/01/2019   Nonallopathic lesion of cervical region 03/01/2019   Nonallopathic lesion of sacral region 03/01/2019   Nonallopathic lesion of thoracic region 03/01/2019   Nonallopathic lesion of rib cage 03/01/2019   Vitamin D deficiency 05/08/2018   Right lateral epicondylitis 04/15/2017   Piriformis syndrome, left 02/25/2017   Mixed stress and urge urinary incontinence 02/07/2016   Carpal tunnel syndrome 01/03/2016   Polyarthralgia 01/03/2016   Galactocele associated with childbirth 11/07/2015  Radial tunnel syndrome 06/17/2014   Cervical radiculopathy 06/10/2014   Bilateral low back pain with right-sided sciatica 06/10/2014   Bipolar depression ([place]) 05/17/2014   Chronic night sweats 06/26/2013   History of recurrent spontaneous abortion, not currently pregnant 01/26/2012   Obesity (BMI 30.0-34.9) 01/25/2012    Past Medical History:  Diagnosis Date   Anxiety    Arthritis    Carpal tunnel syndrome    Depression    GERD  (gastroesophageal reflux disease)    Hammer toe 04/2013   bilateral 4th and 5th    Ingrown left big toenail 04/2013   Mental disorder    Neuromuscular disorder ([place])    Post term pregnancy over 40 weeks 09/21/2015   SVD (spontaneous vaginal delivery) 09/22/2015   UTI (lower urinary tract infection)     Past Surgical History:  Procedure Laterality Date   BREAST LIFT Bilateral    CARPAL TUNNEL RELEASE     CORRECTION HAMMER TOE Bilateral    CRYOABLATION  2004   CYSTOSCOPY WITH HYDRODISTENSION AND BIOPSY  07/28/2004   with urethral dilatation   DILATION AND CURETTAGE OF UTERUS     DILATION AND EVACUATION  12/21/2011   Procedure: DILATATION AND EVACUATION;  Surgeon: Terrance Mass, MD;  Location: [place];  Service: Gynecology;  Laterality: N/A;  first trimester    ESOPHAGEAL MANOMETRY N/A 02/10/2021   Procedure: ESOPHAGEAL MANOMETRY (EM);  Surgeon: Mauri Pole, MD;  Location: [facility];  Service: Endoscopy;  Laterality: N/A;   HAMMER TOE SURGERY Bilateral 04/20/2013   Procedure: BILATERAL HAMMER TOE REPAIR AND EXCISION NAIL PERMANENT LEFT FIRST;  Surgeon: Harriet Masson, DPM;  Location: [place];  Service: Podiatry;  Laterality: Bilateral;  Hammer toe repair fourth and fifth toes bilateral feet with screw placement fourth toes and partial nail excision left great toe   HYSTEROSALPINGOGRAM     REDUCTION MAMMAPLASTY Bilateral 2020   TOE SURGERY     ingrown toenail   WISDOM TOOTH EXTRACTION      Social History   Socioeconomic History   Marital status: Single    Spouse name: Not on file   Number of children: 1   Years of education: Not on file   Highest education level: Not on file  Occupational History   Occupation: Homemaker  Tobacco Use   Smoking status: Never   Smokeless tobacco: Never  Vaping Use   Vaping Use: Never used  Substance and Sexual Activity   Alcohol use: Yes    Comment: occasionally   Drug use: No   Sexual  activity: Yes    Birth control/protection: I.U.D.  Other Topics Concern   Not on file  Social History Narrative   Not on file   Social Determinants of Health   Financial Resource Strain: Low Risk  (08/29/2020)   Overall Financial Resource Strain (CARDIA)    Difficulty of Paying Living Expenses: Not hard at all  Food Insecurity: No Food Insecurity (08/29/2020)   Hunger Vital Sign    Worried About Running Out of Food in the Last Year: Never true    [place] in the Last Year: Never true  Transportation Needs: No Transportation Needs (08/29/2020)   PRAPARE - Hydrologist (Medical): No    Lack of Transportation (Non-Medical): No  Physical Activity: Sufficiently Active (08/29/2020)   Exercise Vital Sign    Days of Exercise per Week: 5 days    Minutes of Exercise per Session: 30  min  Stress: No Stress Concern Present (08/29/2020)   [place]    Feeling of Stress : Not at all  Social Connections: Moderately Integrated (08/29/2020)   Social Connection and Isolation Panel [NHANES]    Frequency of Communication with Friends and Family: More than three times a week    Frequency of Social Gatherings with Friends and Family: More than three times a week    Attends Religious Services: More than 4 times per year    Active Member of [employer] or Organizations: Yes    Attends Archivist Meetings: More than 4 times per year    Marital Status: Never married  Intimate Partner Violence: Not At Risk (08/29/2020)   Humiliation, Afraid, Rape, and Kick questionnaire    Fear of Current or Ex-Partner: No    Emotionally Abused: No    Physically Abused: No    Sexually Abused: No    Family History  Problem Relation Age of Onset   Lung cancer Father    Lung cancer Paternal Grandfather    Seizures Mother    Arthritis Mother    Thyroid disease Maternal Grandmother    Heart disease Paternal Grandmother         2 CABG     Review of Systems  Constitutional: Negative.  Negative for chills and fever.  HENT: Negative.  Negative for congestion and sore throat.   Respiratory: Negative.  Negative for cough and shortness of breath.   Cardiovascular: Negative.  Negative for chest pain and palpitations.  Gastrointestinal:  Negative for abdominal pain, diarrhea, nausea and vomiting.  Genitourinary: Negative.   Skin: Negative.  Negative for rash.  Neurological:  Negative for dizziness and headaches.  All other systems reviewed and are negative.  Today's Vitals   06/28/21 1316 06/28/21 1322  BP: (!) 150/90 (!) 152/94  Pulse: (!) 123   Temp: 99.1 F (37.3 C)   TempSrc: Oral   SpO2: 97%   Weight: 169 lb 2 oz (76.7 kg)   Height: '5\' 1"'$  (1.549 m)    Body mass index is 31.96 kg/m.   Physical Exam Vitals reviewed.  Constitutional:      Appearance: Normal appearance.  HENT:     Head: Normocephalic.     Mouth/Throat:     Mouth: Mucous membranes are moist.     Pharynx: Oropharynx is clear.  Eyes:     Extraocular Movements: Extraocular movements intact.     Conjunctiva/sclera: Conjunctivae normal.     Pupils: Pupils are equal, round, and reactive to light.  Cardiovascular:     Rate and Rhythm: Normal rate and regular rhythm.     Pulses: Normal pulses.     Heart sounds: Normal heart sounds.  Pulmonary:     Effort: Pulmonary effort is normal.     Breath sounds: Normal breath sounds.  Abdominal:     Palpations: Abdomen is soft.     Tenderness: There is no abdominal tenderness.  Musculoskeletal:     Cervical back: No tenderness.     Right lower leg: No edema.     Left lower leg: No edema.  Lymphadenopathy:     Cervical: No cervical adenopathy.  Skin:    General: Skin is warm and dry.     Capillary Refill: Capillary refill takes less than 2 seconds.  Neurological:     General: No focal deficit present.     Mental Status: She is alert and oriented to person, place, and  time.   Psychiatric:        Mood and Affect: Mood normal.        Behavior: Behavior normal.      ASSESSMENT & PLAN: A total of 47-minute was spent with the patient and counseling/coordination of care regarding preparing for this visit, review of most recent office visit notes, review of most recent blood work results, diagnosis of hypertension and management, cardiovascular risks associated with hypertension, need for medication, education on nutrition, prognosis, documentation and need for follow-up.  Problem List Items Addressed This Visit       Cardiovascular and Mediastinum   Essential hypertension - Primary    Elevated blood pressure readings during the last few office visits with different doctors. Persistently elevated. We will start lisinopril 20 mg daily. Advised to monitor blood pressure readings at home several times a day for the next several weeks and keep a log. Dietary approaches to stop hypertension discussed. Follow-up in 3 months.      Relevant Medications   lisinopril (ZESTRIL) 20 MG tablet     Other   Obesity without serious comorbidity    Diet and nutrition discussed.  Advised to decrease amount of daily carbohydrate intake and daily calories. Wants to start medication.  May benefit from Saxenda 1.2 mg daily.      Relevant Medications   Liraglutide -Weight Management (SAXENDA) 18 MG/3ML SOPN   Chronic night sweats    Most likely menopausal symptoms.      Chronic pain of both knees    Benign physical findings.  Not affecting quality of life too much. Advised to take Tylenol as needed. May need orthopedic evaluation in the future.      Patient Instructions  Start lisinopril 20 mg daily. Monitor blood pressure readings at home several times a day for the next several weeks and keep a log. Call the office if numbers persistently elevated.  Follow-up in 3 months.  Hypertension, Adult High blood pressure (hypertension) is when the force of blood pumping  through the arteries is too strong. The arteries are the blood vessels that carry blood from the heart throughout the body. Hypertension forces the heart to work harder to pump blood and may cause arteries to become narrow or stiff. Untreated or uncontrolled hypertension can lead to a heart attack, heart failure, a stroke, kidney disease, and other problems. A blood pressure reading consists of a higher number over a lower number. Ideally, your blood pressure should be below 120/80. The first ("top") number is called the systolic pressure. It is a measure of the pressure in your arteries as your heart beats. The second ("bottom") number is called the diastolic pressure. It is a measure of the pressure in your arteries as the heart relaxes. What are the causes? The exact cause of this condition is not known. There are some conditions that result in high blood pressure. What increases the risk? Certain factors may make you more likely to develop high blood pressure. Some of these risk factors are under your control, including: Smoking. Not getting enough exercise or physical activity. Being overweight. Having too much fat, sugar, calories, or salt (sodium) in your diet. Drinking too much alcohol. Other risk factors include: Having a personal history of heart disease, diabetes, high cholesterol, or kidney disease. Stress. Having a family history of high blood pressure and high cholesterol. Having obstructive sleep apnea. Age. The risk increases with age. What are the signs or symptoms? High blood pressure may not cause symptoms. Very  high blood pressure (hypertensive crisis) may cause: Headache. Fast or irregular heartbeats (palpitations). Shortness of breath. Nosebleed. Nausea and vomiting. Vision changes. Severe chest pain, dizziness, and seizures. How is this diagnosed? This condition is diagnosed by measuring your blood pressure while you are seated, with your arm resting on a flat  surface, your legs uncrossed, and your feet flat on the floor. The cuff of the blood pressure monitor will be placed directly against the skin of your upper arm at the level of your heart. Blood pressure should be measured at least twice using the same arm. Certain conditions can cause a difference in blood pressure between your right and left arms. If you have a high blood pressure reading during one visit or you have normal blood pressure with other risk factors, you may be asked to: Return on a different day to have your blood pressure checked again. Monitor your blood pressure at home for 1 week or longer. If you are diagnosed with hypertension, you may have other blood or imaging tests to help your health care provider understand your overall risk for other conditions. How is this treated? This condition is treated by making healthy lifestyle changes, such as eating healthy foods, exercising more, and reducing your alcohol intake. You may be referred for counseling on a healthy diet and physical activity. Your health care provider may prescribe medicine if lifestyle changes are not enough to get your blood pressure under control and if: Your systolic blood pressure is above 130. Your diastolic blood pressure is above 80. Your personal target blood pressure may vary depending on your medical conditions, your age, and other factors. Follow these instructions at home: Eating and drinking  Eat a diet that is high in fiber and potassium, and low in sodium, added sugar, and fat. An example of this eating plan is called the DASH diet. DASH stands for Dietary Approaches to Stop Hypertension. To eat this way: Eat plenty of fresh fruits and vegetables. Try to fill one half of your plate at each meal with fruits and vegetables. Eat whole grains, such as whole-wheat pasta, brown rice, or whole-grain bread. Fill about one fourth of your plate with whole grains. Eat or drink low-fat dairy products, such as  skim milk or low-fat yogurt. Avoid fatty cuts of meat, processed or cured meats, and poultry with skin. Fill about one fourth of your plate with lean proteins, such as fish, chicken without skin, beans, eggs, or tofu. Avoid pre-made and processed foods. These tend to be higher in sodium, added sugar, and fat. Reduce your daily sodium intake. Many people with hypertension should eat less than 1,500 mg of sodium a day. Do not drink alcohol if: Your health care provider tells you not to drink. You are pregnant, may be pregnant, or are planning to become pregnant. If you drink alcohol: Limit how much you have to: 0-1 drink a day for women. 0-2 drinks a day for men. Know how much alcohol is in your drink. In the [place] one drink equals one 12 oz bottle of beer (355 mL), one 5 oz glass of wine (148 mL), or one 1 oz glass of hard liquor (44 mL). Lifestyle  Work with your health care provider to maintain a healthy body weight or to lose weight. Ask what an ideal weight is for you. Get at least 30 minutes of exercise that causes your heart to beat faster (aerobic exercise) most days of the week. Activities may include walking, swimming, or biking.  Include exercise to strengthen your muscles (resistance exercise), such as Pilates or lifting weights, as part of your weekly exercise routine. Try to do these types of exercises for 30 minutes at least 3 days a week. Do not use any products that contain nicotine or tobacco. These products include cigarettes, chewing tobacco, and vaping devices, such as e-cigarettes. If you need help quitting, ask your health care provider. Monitor your blood pressure at home as told by your health care provider. Keep all follow-up visits. This is important. Medicines Take over-the-counter and prescription medicines only as told by your health care provider. Follow directions carefully. Blood pressure medicines must be taken as prescribed. Do not skip doses of blood pressure  medicine. Doing this puts you at risk for problems and can make the medicine less effective. Ask your health care provider about side effects or reactions to medicines that you should watch for. Contact a health care provider if you: Think you are having a reaction to a medicine you are taking. Have headaches that keep coming back (recurring). Feel dizzy. Have swelling in your ankles. Have trouble with your vision. Get help right away if you: Develop a severe headache or confusion. Have unusual weakness or numbness. Feel faint. Have severe pain in your chest or abdomen. Vomit repeatedly. Have trouble breathing. These symptoms may be an emergency. Get help right away. Call 911. Do not wait to see if the symptoms will go away. Do not drive yourself to the hospital. Summary Hypertension is when the force of blood pumping through your arteries is too strong. If this condition is not controlled, it may put you at risk for serious complications. Your personal target blood pressure may vary depending on your medical conditions, your age, and other factors. For most people, a normal blood pressure is less than 120/80. Hypertension is treated with lifestyle changes, medicines, or a combination of both. Lifestyle changes include losing weight, eating a healthy, low-sodium diet, exercising more, and limiting alcohol. This information is not intended to replace advice given to you by your health care provider. Make sure you discuss any questions you have with your health care provider. Document Revised: 10/25/2020 Document Reviewed: 10/25/2020 Elsevier Patient Education  [place], MD [facility]

## 2021-07-03 ENCOUNTER — Telehealth: Payer: Self-pay | Admitting: Emergency Medicine

## 2021-07-03 ENCOUNTER — Other Ambulatory Visit: Payer: Self-pay | Admitting: Emergency Medicine

## 2021-07-03 DIAGNOSIS — J302 Other seasonal allergic rhinitis: Secondary | ICD-10-CM

## 2021-07-03 NOTE — Telephone Encounter (Signed)
Referral to allergist placed.  Thanks.

## 2021-07-03 NOTE — Telephone Encounter (Signed)
Pt called in to let provider know that when she wakes up every morning, she has a lot of phlegm and can hardly breath.   Wants to know if she should be referred to an allergist or what else could be done.   Call with recommendations.

## 2021-07-05 ENCOUNTER — Encounter: Payer: Self-pay | Admitting: *Deleted

## 2021-07-05 NOTE — Telephone Encounter (Signed)
Sent message via [place] with provider recommendations

## 2021-07-06 ENCOUNTER — Other Ambulatory Visit: Payer: Self-pay | Admitting: Emergency Medicine

## 2021-07-06 ENCOUNTER — Encounter: Payer: Self-pay | Admitting: Family Medicine

## 2021-07-06 ENCOUNTER — Ambulatory Visit (INDEPENDENT_AMBULATORY_CARE_PROVIDER_SITE_OTHER): Payer: Medicare Other | Admitting: Family Medicine

## 2021-07-06 VITALS — BP 130/84 | HR 75 | Temp 97.8°F | Ht 61.0 in | Wt 171.0 lb

## 2021-07-06 DIAGNOSIS — R0982 Postnasal drip: Secondary | ICD-10-CM | POA: Diagnosis not present

## 2021-07-06 DIAGNOSIS — E669 Obesity, unspecified: Secondary | ICD-10-CM

## 2021-07-06 DIAGNOSIS — R319 Hematuria, unspecified: Secondary | ICD-10-CM | POA: Diagnosis not present

## 2021-07-06 DIAGNOSIS — J0140 Acute pansinusitis, unspecified: Secondary | ICD-10-CM | POA: Diagnosis not present

## 2021-07-06 DIAGNOSIS — R058 Other specified cough: Secondary | ICD-10-CM

## 2021-07-06 DIAGNOSIS — R110 Nausea: Secondary | ICD-10-CM | POA: Insufficient documentation

## 2021-07-06 DIAGNOSIS — E559 Vitamin D deficiency, unspecified: Secondary | ICD-10-CM | POA: Diagnosis not present

## 2021-07-06 DIAGNOSIS — R5383 Other fatigue: Secondary | ICD-10-CM | POA: Diagnosis not present

## 2021-07-06 DIAGNOSIS — N644 Mastodynia: Secondary | ICD-10-CM | POA: Diagnosis not present

## 2021-07-06 LAB — CBC WITH DIFFERENTIAL/PLATELET
Basophils Absolute: 0 10*3/uL (ref 0.0–0.1)
Basophils Relative: 0.6 % (ref 0.0–3.0)
Eosinophils Absolute: 0.1 10*3/uL (ref 0.0–0.7)
Eosinophils Relative: 1.8 % (ref 0.0–5.0)
HCT: 42.6 % (ref 36.0–46.0)
Hemoglobin: 14.3 g/dL (ref 12.0–15.0)
Lymphocytes Relative: 28 % (ref 12.0–46.0)
Lymphs Abs: 1.9 10*3/uL (ref 0.7–4.0)
MCHC: 33.6 g/dL (ref 30.0–36.0)
MCV: 91.2 fl (ref 78.0–100.0)
Monocytes Absolute: 0.5 10*3/uL (ref 0.1–1.0)
Monocytes Relative: 7.3 % (ref 3.0–12.0)
Neutro Abs: 4.2 10*3/uL (ref 1.4–7.7)
Neutrophils Relative %: 62.3 % (ref 43.0–77.0)
Platelets: 300 10*3/uL (ref 150.0–400.0)
RBC: 4.67 Mil/uL (ref 3.87–5.11)
RDW: 13.6 % (ref 11.5–15.5)
WBC: 6.7 10*3/uL (ref 4.0–10.5)

## 2021-07-06 LAB — COMPREHENSIVE METABOLIC PANEL
ALT: 22 U/L (ref 0–35)
AST: 24 U/L (ref 0–37)
Albumin: 4.5 g/dL (ref 3.5–5.2)
Alkaline Phosphatase: 69 U/L (ref 39–117)
BUN: 11 mg/dL (ref 6–23)
CO2: 29 mEq/L (ref 19–32)
Calcium: 9.3 mg/dL (ref 8.4–10.5)
Chloride: 100 mEq/L (ref 96–112)
Creatinine, Ser: 0.68 mg/dL (ref 0.40–1.20)
GFR: 105.02 mL/min (ref >60.00)
Glucose, Bld: 93 mg/dL (ref 70–99)
Potassium: 3.8 mEq/L (ref 3.5–5.1)
Sodium: 136 mEq/L (ref 135–145)
Total Bilirubin: 0.3 mg/dL (ref 0.2–1.2)
Total Protein: 8 g/dL (ref 6.0–8.3)

## 2021-07-06 LAB — VITAMIN B12: Vitamin B-12: 529 pg/mL (ref 211–911)

## 2021-07-06 LAB — POCT URINALYSIS DIPSTICK
Bilirubin, UA: NEGATIVE
Glucose, UA: NEGATIVE
Ketones, UA: NEGATIVE
Leukocytes, UA: NEGATIVE
Nitrite, UA: NEGATIVE
Protein, UA: POSITIVE — AB
Spec Grav, UA: >=1.03 — AB (ref 1.010–1.025)
Urobilinogen, UA: 0.2 E.U./dL
pH, UA: 6 (ref 5.0–8.0)

## 2021-07-06 LAB — POCT URINE PREGNANCY: Preg Test, Ur: NEGATIVE

## 2021-07-06 LAB — T4, FREE: Free T4: 0.74 ng/dL (ref 0.60–1.60)

## 2021-07-06 LAB — TSH: TSH: 1.62 u[IU]/mL (ref 0.35–5.50)

## 2021-07-06 LAB — VITAMIN D 25 HYDROXY (VIT D DEFICIENCY, FRACTURES): VITD: 19.45 ng/mL — ABNORMAL LOW (ref 30.00–100.00)

## 2021-07-06 LAB — POC COVID19 BINAXNOW: SARS Coronavirus 2 Ag: NEGATIVE

## 2021-07-06 MED ORDER — VITAMIN D (ERGOCALCIFEROL) 1.25 MG (50000 UNIT) PO CAPS: 50000.0000 [IU] | ORAL_CAPSULE | ORAL | 0 refills | Status: DC

## 2021-07-06 MED ORDER — INSULIN PEN NEEDLE 32G X 4 MM MISC: 0 refills | Status: DC

## 2021-07-06 MED ORDER — AMOXICILLIN 875 MG PO TABS: 875.0000 mg | ORAL_TABLET | Freq: Two times a day (BID) | ORAL | 0 refills | Status: DC

## 2021-07-06 MED ORDER — ONDANSETRON 4 MG PO TBDP: 4.0000 mg | ORAL_TABLET | Freq: Three times a day (TID) | ORAL | 0 refills | Status: DC | PRN

## 2021-07-06 NOTE — Assessment & Plan Note (Signed)
Negative COVID test and negative urine pregnancy test May be related to acute sinusitis.  Check labs and follow-up when she completes the antibiotic if she is still experiencing fatigue.

## 2021-07-06 NOTE — Assessment & Plan Note (Signed)
Urinalysis dipstick shows 1+ RBCs.  Negative for infection.  No sign of renal calculus. She notes that she does have spotting from time to time with her IUD.  Urine sent for microscopy

## 2021-07-06 NOTE — Progress Notes (Signed)
Subjective:     Patient ID: Melissa [name], female    DOB: [birth year], 46 y.o.   MRN: [id number]  Chief Complaint  Patient presents with   Nausea    Nauseous and "just feeling like herself" for about a week along with fatigue. Would like to see if she can get lab work for the fatigue.     HPI Patient is in today for fatigue, body aches and nausea x 5 days. Nasal congestion and post nasal drainage. Dry cough at night. Breasts feel heavy.   Has not started on Saxenda yet.   Denies fever, chills, dizziness, chest pain, palpitations, shortness of breath, abdominal pain, N/V/D, urinary symptoms.    IUD- Mirena and has been in for 5 years.  Does not get periods.      Health Maintenance Due  Topic Date Due   PAP SMEAR-Modifier  02/16/2019   COLONOSCOPY (Pts 45-69yr Insurance coverage will need to be confirmed)  Never done    Past Medical History:  Diagnosis Date   Anxiety    Arthritis    Carpal tunnel syndrome    Depression    GERD (gastroesophageal reflux disease)    Hammer toe 04/2013   bilateral 4th and 5th    Ingrown left big toenail 04/2013   Mental disorder    Neuromuscular disorder ([place]    Post term pregnancy over 40 weeks 09/21/2015   SVD (spontaneous vaginal delivery) 09/22/2015   UTI (lower urinary tract infection)     Past Surgical History:  Procedure Laterality Date   BREAST LIFT Bilateral    CARPAL TUNNEL RELEASE     CORRECTION HAMMER TOE Bilateral    CRYOABLATION  2004   CYSTOSCOPY WITH HYDRODISTENSION AND BIOPSY  07/28/2004   with urethral dilatation   DILATION AND CURETTAGE OF UTERUS     DILATION AND EVACUATION  12/21/2011   Procedure: DILATATION AND EVACUATION;  Surgeon: JTerrance Mass MD;  [facility]  Service: Gynecology;  Laterality: N/A;  first trimester    ESOPHAGEAL MANOMETRY N/A 02/10/2021   Procedure: ESOPHAGEAL MANOMETRY (EM);  Surgeon: NMauri Pole MD;  [facility];  Service: Endoscopy;   Laterality: N/A;   HAMMER TOE SURGERY Bilateral 04/20/2013   Procedure: BILATERAL HAMMER TOE REPAIR AND EXCISION NAIL PERMANENT LEFT FIRST;  Surgeon: RHarriet Masson DPM;  [facility]  Service: Podiatry;  Laterality: Bilateral;  Hammer toe repair fourth and fifth toes bilateral feet with screw placement fourth toes and partial nail excision left great toe   HYSTEROSALPINGOGRAM     REDUCTION MAMMAPLASTY Bilateral 2020   TOE SURGERY     ingrown toenail   WISDOM TOOTH EXTRACTION      Family History  Problem Relation Age of Onset   Lung cancer Father    Lung cancer Paternal Grandfather    Seizures Mother    Arthritis Mother    Thyroid disease Maternal Grandmother    Heart disease Paternal Grandmother        2 CABG    Social History   Socioeconomic History   Marital status: Single    Spouse name: Not on file   Number of children: 1   Years of education: Not on file   Highest education level: Not on file  Occupational History   Occupation: Homemaker  Tobacco Use   Smoking status: Never   Smokeless tobacco: Never  Vaping Use   Vaping Use: Never used  Substance and Sexual Activity  Alcohol use: Yes    Comment: occasionally   Drug use: No   Sexual activity: Yes    Birth control/protection: I.U.D.  Other Topics Concern   Not on file  Social History Narrative   Not on file   Social Determinants of Health   Financial Resource Strain: Low Risk  (08/29/2020)   Overall Financial Resource Strain (CARDIA)    Difficulty of Paying Living Expenses: Not hard at all  Food Insecurity: No Food Insecurity (08/29/2020)   Hunger Vital Sign    Worried About Running Out of Food in the Last Year: Never true    [place] in the Last Year: Never true  Transportation Needs: No Transportation Needs (08/29/2020)   PRAPARE - Hydrologist (Medical): No    Lack of Transportation (Non-Medical): No  Physical Activity: Sufficiently Active  (08/29/2020)   Exercise Vital Sign    Days of Exercise per Week: 5 days    Minutes of Exercise per Session: 30 min  Stress: No Stress Concern Present (08/29/2020)   [place]    Feeling of Stress : Not at all  Social Connections: Moderately Integrated (08/29/2020)   Social Connection and Isolation Panel [NHANES]    Frequency of Communication with Friends and Family: More than three times a week    Frequency of Social Gatherings with Friends and Family: More than three times a week    Attends Religious Services: More than 4 times per year    Active Member of [employer] or Organizations: Yes    Attends Archivist Meetings: More than 4 times per year    Marital Status: Never married  Intimate Partner Violence: Not At Risk (08/29/2020)   Humiliation, Afraid, Rape, and Kick questionnaire    Fear of Current or Ex-Partner: No    Emotionally Abused: No    Physically Abused: No    Sexually Abused: No    Outpatient Medications Prior to Visit  Medication Sig Dispense Refill   azelastine (ASTELIN) 0.1 % nasal spray Place 2 sprays into both nostrils 2 (two) times daily. Use in each nostril as directed 30 mL 12   buPROPion (WELLBUTRIN XL) 150 MG 24 hr tablet Take 450 mg by mouth daily.     Fluocinolone Acetonide Body 0.01 % OIL fluocinolone 0.01 % scalp oil and shower cap  APPLY TO SCALP AT BEDTIME,MAY WASH UPON AWAKENING     fluticasone (FLONASE) 50 MCG/ACT nasal spray Use 2 spray(s) in each nostril once daily 16 g 11   ibuprofen (ADVIL) 600 MG tablet Take 1 tablet (600 mg total) by mouth every 6 (six) hours as needed. 30 tablet 0   lansoprazole (PREVACID SOLUTAB) 30 MG disintegrating tablet Take 1 tablet (30 mg total) by mouth 2 (two) times daily. 180 tablet 3   lisinopril (ZESTRIL) 20 MG tablet Take 1 tablet (20 mg total) by mouth daily. 90 tablet 3   PARoxetine (PAXIL) 10 MG tablet Take 10 mg by mouth daily.     Salicylic  Acid 3 % SHAM Use daily as directed for dry scalp 236 mL 1   selenium sulfide (SELSUN) 2.5 % shampoo USE LOTION EXTERNALLY  TO AFFECTED AREA ONCE DAILY AS NEEDED IRRITATION 120 mL 0   tranexamic acid (LYSTEDA) 650 MG TABS tablet Take 650 mg by mouth daily.     XIIDRA 5 % SOLN INSTILL 1 DROP TWICE DAILY INTO EACH EYE     predniSONE (  DELTASONE) 20 MG tablet Take 2 tablets (40 mg total) by mouth daily with breakfast. 10 tablet 0   SAXENDA 18 MG/3ML SOPN INJECT 1.2 MG UNDER THE SKIN ONCE DAILY 3 mL 3   No facility-administered medications prior to visit.    Allergies  Allergen Reactions   Bactrim [Sulfamethoxazole-Trimethoprim]     ROS Pertinent positives and negatives in the history of present illness.     Objective:    Physical Exam Constitutional:      General: She is not in acute distress.    Appearance: She is not ill-appearing.  HENT:     Nose:     Right Sinus: Maxillary sinus tenderness and frontal sinus tenderness present.     Left Sinus: Maxillary sinus tenderness and frontal sinus tenderness present.     Mouth/Throat:     Mouth: Mucous membranes are moist.  Eyes:     Conjunctiva/sclera: Conjunctivae normal.     Pupils: Pupils are equal, round, and reactive to light.  Cardiovascular:     Rate and Rhythm: Normal rate and regular rhythm.  Pulmonary:     Effort: Pulmonary effort is normal.     Breath sounds: Normal breath sounds.  Musculoskeletal:     Cervical back: Normal range of motion and neck supple. No tenderness.  Lymphadenopathy:     Cervical: No cervical adenopathy.  Skin:    General: Skin is warm and dry.  Neurological:     Mental Status: She is alert and oriented to person, place, and time.     Cranial Nerves: Cranial nerves 2-12 are intact.     Sensory: Sensation is intact.     Motor: Motor function is intact.  Psychiatric:        Mood and Affect: Mood normal.        Speech: Speech normal.        Behavior: Behavior normal.        Cognition and  Memory: Cognition normal.     BP 130/84 (BP Location: Left Arm, Patient Position: Sitting, Cuff Size: Large)   Pulse 75   Temp 97.8 F (36.6 C) (Temporal)   Ht '5\' 1"'$  (1.549 m)   Wt 171 lb (77.6 kg)   SpO2 99%   BMI 32.31 kg/m  Wt Readings from Last 3 Encounters:  07/06/21 171 lb (77.6 kg)  06/28/21 169 lb 2 oz (76.7 kg)  03/27/21 165 lb (74.8 kg)       Assessment & Plan:   Problem List Items Addressed This Visit       Respiratory   Acute non-recurrent pansinusitis    Amoxicillin prescribed.  Discussed that sinusitis may be causing other related symptoms such as fatigue, nausea and general malaise.  Discussed symptomatic treatment.  Follow-up if worsening or not back to baseline when she completes the antibiotic.      Relevant Medications   amoxicillin (AMOXIL) 875 MG tablet     Other   Breast tenderness    Most likely hormonal.  Negative urine pregnancy test. follow-up if not improving      Relevant Orders   TSH (Completed)   T4, free (Completed)   POCT urinalysis dipstick (Completed)   POCT urine pregnancy (Completed)   Fatigue - Primary    Negative COVID test and negative urine pregnancy test May be related to acute sinusitis.  Check labs and follow-up when she completes the antibiotic if she is still experiencing fatigue.      Relevant Orders   CBC with Differential/Platelet (Completed)  Comprehensive metabolic panel (Completed)   TSH (Completed)   T4, free (Completed)   VITAMIN D 25 Hydroxy (Vit-D Deficiency, Fractures) (Completed)   Vitamin B12 (Completed)   POCT urinalysis dipstick (Completed)   POC COVID-19 (Completed)   POCT urine pregnancy (Completed)   Hematuria    Urinalysis dipstick shows 1+ RBCs.  Negative for infection.  No sign of renal calculus. She notes that she does have spotting from time to time with her IUD.  Urine sent for microscopy      Relevant Orders   Urine Microscopic   POCT urinalysis dipstick (Completed)   Nausea     Negative urine pregnancy test.  Negative COVID test.  Consider postnasal drainage as etiology for nausea.  Zofran prescribed.  Hopefully once acute sinusitis has resolved, nausea will improve.  She will follow-up if not.      Relevant Medications   ondansetron (ZOFRAN-ODT) 4 MG disintegrating tablet   Other Relevant Orders   TSH (Completed)   T4, free (Completed)   POC COVID-19 (Completed)   Vitamin D deficiency    Ergocalciferol 50,000 IUs once weekly prescribed x12 weeks.  She will start on over-the-counter vitamin D3 once the prescription is completed.      Relevant Medications   Vitamin D, Ergocalciferol, (DRISDOL) 1.25 MG (50000 UNIT) CAPS capsule   Other Visit Diagnoses     Dry cough       Relevant Orders   POC COVID-19 (Completed)   Post-nasal drainage       Relevant Orders   POC COVID-19 (Completed)       I have discontinued Jarelis S. Largo's predniSONE. I am also having her start on Insulin Pen Needle, amoxicillin, ondansetron, and Vitamin D (Ergocalciferol). Additionally, I am having her maintain her Salicylic Acid, fluticasone, azelastine, selenium sulfide, Xiidra, Fluocinolone Acetonide Body, lansoprazole, buPROPion, PARoxetine, tranexamic acid, ibuprofen, and lisinopril.  Meds ordered this encounter  Medications   Insulin Pen Needle 32G X 4 MM MISC    Sig: Use as directed    Dispense:  100 each    Refill:  0   amoxicillin (AMOXIL) 875 MG tablet    Sig: Take 1 tablet (875 mg total) by mouth 2 (two) times daily for 10 days.    Dispense:  20 tablet    Refill:  0    Order Specific Question:   Supervising Provider    Answer:   Pricilla Holm A [[id number]]   ondansetron (ZOFRAN-ODT) 4 MG disintegrating tablet    Sig: Take 1 tablet (4 mg total) by mouth every 8 (eight) hours as needed for nausea or vomiting.    Dispense:  20 tablet    Refill:  0    Order Specific Question:   Supervising Provider    Answer:   Pricilla Holm A [[id number]]   Vitamin D,  Ergocalciferol, (DRISDOL) 1.25 MG (50000 UNIT) CAPS capsule    Sig: Take 1 capsule (50,000 Units total) by mouth every 7 (seven) days.    Dispense:  12 capsule    Refill:  0    Order Specific Question:   Supervising Provider    Answer:   Pricilla Holm A [[id number]]

## 2021-07-06 NOTE — Assessment & Plan Note (Signed)
Ergocalciferol 50,000 IUs once weekly prescribed x12 weeks.  She will start on over-the-counter vitamin D3 once the prescription is completed.

## 2021-07-06 NOTE — Assessment & Plan Note (Addendum)
Most likely hormonal.  Negative urine pregnancy test. follow-up if not improving

## 2021-07-06 NOTE — Patient Instructions (Addendum)
Please go to the first floor for labs before you leave today.  Your COVID test is negative and urine pregnancy test is negative.  I sent an antibiotic, amoxicillin, to your pharmacy to treat for sinus infection.  I recommend that you try Flonase which is a steroid nasal spray.  Stay well-hydrated.  I also prescribed Zofran, dissolvable tablets, for nausea.   Follow-up if you are getting worse or not back to baseline when you complete the antibiotic.  Fatigue If you have fatigue, you feel tired all the time and have a lack of energy or a lack of motivation. Fatigue may make it difficult to start or complete tasks because of exhaustion. Occasional or mild fatigue is often a normal response to activity or life. However, long-term (chronic) or extreme fatigue may be a symptom of a medical condition such as: Depression. Not having enough red blood cells or hemoglobin in the blood (anemia). A problem with a small gland located in the lower front part of the neck (thyroid disorder). Rheumatologic conditions. These are problems related to the body's defense system (immune system). Infections, especially certain viral infections. Fatigue can also lead to negative health outcomes over time. Follow these instructions at home: Medicines Take over-the-counter and prescription medicines only as told by your health care provider. Take a multivitamin if told by your health care provider. Do not use herbal or dietary supplements unless they are approved by your health care provider. Eating and drinking  Avoid heavy meals in the evening. Eat a well-balanced diet, which includes lean proteins, whole grains, plenty of fruits and vegetables, and low-fat dairy products. Avoid eating or drinking too many products with caffeine in them. Avoid alcohol. Drink enough fluid to keep your urine pale yellow. Activity  Exercise regularly, as told by your health care provider. Use or practice techniques to help you  relax, such as yoga, tai chi, meditation, or massage therapy. Lifestyle Change situations that cause you stress. Try to keep your work and personal schedules in balance. Do not use recreational or illegal drugs. General instructions Monitor your fatigue for any changes. Go to bed and get up at the same time every day. Avoid fatigue by pacing yourself during the day and getting enough sleep at night. Maintain a healthy weight. Contact a health care provider if: Your fatigue does not get better. You have a fever. You suddenly lose or gain weight. You have headaches. You have trouble falling asleep or sleeping through the night. You feel angry, guilty, anxious, or sad. You have swelling in your legs or another part of your body. Get help right away if: You feel confused, feel like you might faint, or faint. Your vision is blurry or you have a severe headache. You have severe pain in your abdomen, your back, or the area between your waist and hips (pelvis). You have chest pain, shortness of breath, or an irregular or fast heartbeat. You are unable to urinate, or you urinate less than normal. You have abnormal bleeding from the rectum, nose, lungs, nipples, or, if you are female, the vagina. You vomit blood. You have thoughts about hurting yourself or others. These symptoms may be an emergency. Get help right away. Call 911. Do not wait to see if the symptoms will go away. Do not drive yourself to the hospital. Get help right away if you feel like you may hurt yourself or others, or have thoughts about taking your own life. Go to your nearest emergency room or: Call  911. Call the [facility] at [phone]) [phone] or 988. This is open 24 hours a day. Text the Crisis Text Line at 509 765 4576. Summary If you have fatigue, you feel tired all the time and have a lack of energy or a lack of motivation. Fatigue may make it difficult to start or complete tasks because of  exhaustion. Long-term (chronic) or extreme fatigue may be a symptom of a medical condition. Exercise regularly, as told by your health care provider. Change situations that cause you stress. Try to keep your work and personal schedules in balance. This information is not intended to replace advice given to you by your health care provider. Make sure you discuss any questions you have with your health care provider. Document Revised: 10/10/2020 Document Reviewed: 10/10/2020 Elsevier Patient Education  [employer].

## 2021-07-06 NOTE — Assessment & Plan Note (Signed)
Negative urine pregnancy test.  Negative COVID test.  Consider postnasal drainage as etiology for nausea.  Zofran prescribed.  Hopefully once acute sinusitis has resolved, nausea will improve.  She will follow-up if not.

## 2021-07-06 NOTE — Assessment & Plan Note (Signed)
Amoxicillin prescribed.  Discussed that sinusitis may be causing other related symptoms such as fatigue, nausea and general malaise.  Discussed symptomatic treatment.  Follow-up if worsening or not back to baseline when she completes the antibiotic.

## 2021-07-07 ENCOUNTER — Other Ambulatory Visit: Payer: Self-pay | Admitting: Emergency Medicine

## 2021-07-07 ENCOUNTER — Other Ambulatory Visit: Payer: Self-pay | Admitting: Family Medicine

## 2021-07-07 DIAGNOSIS — E669 Obesity, unspecified: Secondary | ICD-10-CM

## 2021-07-07 LAB — URINALYSIS, MICROSCOPIC ONLY: RBC / HPF: NONE SEEN (ref 0–?)

## 2021-07-07 NOTE — Telephone Encounter (Signed)
She can go to a different pharmacy or wait until this pharmacy gets it.  There is no urgency.  Thanks.

## 2021-07-09 ENCOUNTER — Encounter: Payer: Self-pay | Admitting: Emergency Medicine

## 2021-07-11 NOTE — Telephone Encounter (Signed)
New prescription for Saxenda sent to pharmacy on record.  Thanks.

## 2021-07-12 ENCOUNTER — Other Ambulatory Visit: Payer: Self-pay | Admitting: *Deleted

## 2021-07-12 ENCOUNTER — Other Ambulatory Visit: Payer: Self-pay

## 2021-07-12 DIAGNOSIS — E669 Obesity, unspecified: Secondary | ICD-10-CM

## 2021-07-12 MED ORDER — SAXENDA 18 MG/3ML ~~LOC~~ SOPN: 1.2000 mg | PEN_INJECTOR | Freq: Every day | SUBCUTANEOUS | 5 refills | Status: DC

## 2021-07-13 ENCOUNTER — Telehealth: Payer: Self-pay

## 2021-07-13 NOTE — Telephone Encounter (Signed)
Pt stating she only took 3 of her antibiotic and lost the rest and she is requesting a refill.. would you like to refill?

## 2021-07-13 NOTE — Telephone Encounter (Signed)
Pt is calling stating that she lost her abx amoxicillin (AMOXIL) 875 MG tablet after taking only 3 days worth.   [id number] [address] [place]  LCassell Clement6/28/23

## 2021-07-14 ENCOUNTER — Other Ambulatory Visit: Payer: Self-pay | Admitting: Family Medicine

## 2021-07-14 DIAGNOSIS — J0140 Acute pansinusitis, unspecified: Secondary | ICD-10-CM

## 2021-07-14 MED ORDER — AMOXICILLIN 875 MG PO TABS: 875.0000 mg | ORAL_TABLET | Freq: Two times a day (BID) | ORAL | 0 refills | Status: AC

## 2021-07-14 NOTE — Telephone Encounter (Signed)
Pt is calling to requesting Phentermine since the other weight loss medication is on Backorder.  Pharmacy: [id number] [place], [place] [address]   Pt is requesting a CB [phone]

## 2021-07-14 NOTE — Telephone Encounter (Signed)
Do not recommend Phentermine. Never prescribe it. Thanks.

## 2021-07-15 ENCOUNTER — Other Ambulatory Visit: Payer: Self-pay | Admitting: Emergency Medicine

## 2021-07-15 DIAGNOSIS — E669 Obesity, unspecified: Secondary | ICD-10-CM

## 2021-07-15 MED ORDER — RYBELSUS 7 MG PO TABS: 7.0000 mg | ORAL_TABLET | Freq: Every day | ORAL | 1 refills | Status: DC

## 2021-07-15 NOTE — Telephone Encounter (Signed)
Last recommendation is daily Rybelsus.  She should eat less, eat better, and exercise more.  I will also place referral to medical weight management clinic.  No further recommendations regarding medications at this point.  Thanks.

## 2021-07-17 NOTE — Progress Notes (Unsigned)
Melissa [name] [place] Phone: [phone] Subjective:   Melissa [name], am serving as a scribe for Dr. Hulan Saas.   I'm seeing this patient by the request  of:  [name], Melissa Bloomer, MD  CC: neck and back pain   IEP:PIRJJOACZY  Melissa [name] is a 46 y.o. female coming in with complaint of back and neck pain. OMT 03/27/2021. Patient states that her neck and back is somewhat better. Arm is the same. [name] better or worse. Does feel like effexor is helpful.   Thumb in R side is bothering her. Does feel like Effexor is helping and needs a refill.   Medications patient has been prescribed: None  Taking:         Reviewed prior external information including notes and imaging from previsou exam, outside providers and external EMR if available.   As well as notes that were available from care everywhere and other healthcare systems.  Past medical history, social, surgical and family history all reviewed in electronic medical record.  [name] pertanent information unless stated regarding to the chief complaint.   Past Medical History:  Diagnosis Date   Anxiety    Arthritis    Carpal tunnel syndrome    Depression    GERD (gastroesophageal reflux disease)    Hammer toe 04/2013   bilateral 4th and 5th    Ingrown left big toenail 04/2013   Mental disorder    Neuromuscular disorder ([place])    Post term pregnancy over 40 weeks 09/21/2015   SVD (spontaneous vaginal delivery) 09/22/2015   UTI (lower urinary tract infection)     Allergies  Allergen Reactions   Bactrim [Sulfamethoxazole-Trimethoprim]      Review of Systems:  [name] headache, visual changes, nausea, vomiting, diarrhea, constipation, dizziness, abdominal pain, skin rash, fevers, chills, night sweats, weight loss, swollen lymph nodes, body aches, joint swelling, chest pain, shortness of breath, mood changes. POSITIVE muscle aches  Objective  Blood pressure 126/88,  pulse 90, height '5\' 1"'$  (1.549 m), weight 171 lb (77.6 kg), SpO2 97 %.   General: [name] apparent distress alert and oriented x3 mood and affect normal, dressed appropriately.  HEENT: Pupils equal, extraocular movements intact  Respiratory: Patient's speak in full sentences and does not appear short of breath  Cardiovascular: [name] lower extremity edema, non tender, [name] erythema  Gait MSK:  Back: Neck exam does have some loss of lordosis.  Some tenderness to palpation.  Patient low back also has some tightness noted in the thoracolumbar juncture  Osteopathic findings  C2 flexed rotated and side bent right C5 flexed rotated and side bent left T3 extended rotated and side bent right inhaled rib T8 extended rotated and side bent left L1 flexed rotated and side bent right L3 flexed rotated and side bent left Sacrum right on right     Assessment and Plan:  Cervical radiculopathy Patient is responding extremely well to osteopathic manipulation.  Discussed posture ergonomics again.  Patient is having some difficulty figuring out what medication she was on before we make any other changes I would like to see what medication she is truly taking at the time.  Patient will send pictures when she gets home through [place] and we will discuss any changes.  Concerned because patient has been prescribed many different antidepressant medications over the course of time to make sure were not having any interactions.  Follow-up again in 6 to 8 weeks   Total time  discussing medications 31 minutes Nonallopathic problems  Decision today to treat with OMT was based on Physical Exam  After verbal consent patient was treated with HVLA, ME, FPR techniques in cervical, rib, thoracic, lumbar, and sacral  areas  Patient tolerated the procedure well with improvement in symptoms  Patient given exercises, stretches and lifestyle modifications  See medications in patient instructions if given  Patient will follow up  in 4-8 weeks    The above documentation has been reviewed and is accurate and complete Lyndal Pulley, DO          Note: This dictation was prepared with Dragon dictation along with smaller phrase technology. Any transcriptional errors that result from this process are unintentional.

## 2021-07-18 ENCOUNTER — Encounter: Payer: Self-pay | Admitting: Family Medicine

## 2021-07-18 ENCOUNTER — Ambulatory Visit (INDEPENDENT_AMBULATORY_CARE_PROVIDER_SITE_OTHER): Payer: Medicare Other | Admitting: Family Medicine

## 2021-07-18 VITALS — BP 126/88 | HR 90 | Ht 61.0 in | Wt 171.0 lb

## 2021-07-18 DIAGNOSIS — M9903 Segmental and somatic dysfunction of lumbar region: Secondary | ICD-10-CM | POA: Diagnosis not present

## 2021-07-18 DIAGNOSIS — M9904 Segmental and somatic dysfunction of sacral region: Secondary | ICD-10-CM

## 2021-07-18 DIAGNOSIS — M9908 Segmental and somatic dysfunction of rib cage: Secondary | ICD-10-CM | POA: Diagnosis not present

## 2021-07-18 DIAGNOSIS — M5412 Radiculopathy, cervical region: Secondary | ICD-10-CM

## 2021-07-18 DIAGNOSIS — M9901 Segmental and somatic dysfunction of cervical region: Secondary | ICD-10-CM | POA: Diagnosis not present

## 2021-07-18 DIAGNOSIS — M9902 Segmental and somatic dysfunction of thoracic region: Secondary | ICD-10-CM | POA: Diagnosis not present

## 2021-07-18 NOTE — Patient Instructions (Signed)
Send pictures adn we will come up with plan for medication See me in 6 weeks

## 2021-07-18 NOTE — Assessment & Plan Note (Signed)
Patient is responding extremely well to osteopathic manipulation.  Discussed posture ergonomics again.  Patient is having some difficulty figuring out what medication she was on before we make any other changes I would like to see what medication she is truly taking at the time.  Patient will send pictures when she gets home through [place] and we will discuss any changes.  Concerned because patient has been prescribed many different antidepressant medications over the course of time to make sure were not having any interactions.  Follow-up again in 6 to 8 weeks

## 2021-07-24 ENCOUNTER — Other Ambulatory Visit: Payer: Self-pay | Admitting: Family Medicine

## 2021-07-25 NOTE — Telephone Encounter (Signed)
Impossible to diagnose through [place].  Needs office visit.

## 2021-07-27 ENCOUNTER — Telehealth: Payer: Self-pay

## 2021-07-27 MED ORDER — FLUCONAZOLE 150 MG PO TABS: ORAL_TABLET | ORAL | 1 refills | Status: DC

## 2021-07-27 NOTE — Telephone Encounter (Signed)
[facility] 

## 2021-07-27 NOTE — Telephone Encounter (Signed)
Pt states she is needing a rx for a yeast infection as she was rx'd ABX by Loletha Carrow and is now experiencing a yeast infection.  **Vickie and Dr. Mitchel Honour is out of the office at this time.

## 2021-08-02 DIAGNOSIS — L650 Telogen effluvium: Secondary | ICD-10-CM | POA: Diagnosis not present

## 2021-08-07 NOTE — Progress Notes (Deleted)
08/07/2021 Melissa [name] [id number] Sep 13, 1975  Referring provider: Horald [name], * Primary GI doctor: Dr. Silverio [name]  ASSESSMENT AND PLAN:   There are no diagnoses linked to this encounter. ?MBS/? functional History of Present Illness:  46 y.o. female  with a past medical history of type 2 diabetes on Rybelsus, depression/anxiety vitamin D deficiency and others listed below, returns to clinic today for evaluation of dysphagia. Last seen in the office 11/29/2020 with Melissa [name] for dysphagia that she has had since March 2021. 04/2019 endoscopy showed no abnormality to explain patient's dysphagia, dilated, biopsy, GERD flap valve classified Hill grade 2, nonbleeding gastric ulcer with biopsy showing H. pylori without intestinal metaplasia, no EOE.  Reported no symptom improvement after EGD with dilatation.  Patient was treated for H. pylori however had never had eradication study.  Future order was placed but never completed. 02/10/2021 esophageal manometry showed normal laxation of EG junction, no significant esophageal peristolic abnormality detected GERD well-controlled on Prevacid.  Current Medications:   Current Outpatient Medications (Endocrine & Metabolic):    Semaglutide (RYBELSUS) 7 MG TABS, Take 7 mg by mouth daily.  Current Outpatient Medications (Cardiovascular):    lisinopril (ZESTRIL) 20 MG tablet, Take 1 tablet (20 mg total) by mouth daily.  Current Outpatient Medications (Respiratory):    azelastine (ASTELIN) 0.1 % nasal spray, Place 2 sprays into both nostrils 2 (two) times daily. Use in each nostril as directed   fluticasone (FLONASE) 50 MCG/ACT nasal spray, Use 2 spray(s) in each nostril once daily  Current Outpatient Medications (Analgesics):    ibuprofen (ADVIL) 600 MG tablet, Take 1 tablet (600 mg total) by mouth every 6 (six) hours as needed.  Current Outpatient Medications (Hematological):    tranexamic acid (LYSTEDA) 650 MG TABS tablet, Take  650 mg by mouth daily.  Current Outpatient Medications (Other):    buPROPion (WELLBUTRIN XL) 150 MG 24 hr tablet, Take 450 mg by mouth daily.   fluconazole (DIFLUCAN) 150 MG tablet, 1 tab by mouth every 3 days as needed   Fluocinolone Acetonide Body 0.01 % OIL, fluocinolone 0.01 % scalp oil and shower cap  APPLY TO SCALP AT [facility] UPON AWAKENING   Insulin Pen Needle 32G X 4 MM MISC, Use as directed   lansoprazole (PREVACID SOLUTAB) 30 MG disintegrating tablet, Take 1 tablet (30 mg total) by mouth 2 (two) times daily.   ondansetron (ZOFRAN-ODT) 4 MG disintegrating tablet, Take 1 tablet (4 mg total) by mouth every 8 (eight) hours as needed for nausea or vomiting.   Salicylic Acid 3 % SHAM, Use daily as directed for dry scalp   selenium sulfide (SELSUN) 2.5 % shampoo, USE LOTION EXTERNALLY  TO AFFECTED AREA ONCE DAILY AS NEEDED IRRITATION   Vitamin D, Ergocalciferol, (DRISDOL) 1.25 MG (50000 UNIT) CAPS capsule, Take 1 capsule (50,000 Units total) by mouth every 7 (seven) days.   XIIDRA 5 % SOLN, INSTILL 1 DROP TWICE DAILY INTO EACH EYE  Surgical History:  She  has a past surgical history that includes Cryoablation (2004); Wisdom tooth extraction; Dilation and evacuation (12/21/2011); Cystoscopy with hydrodistension and biopsy (07/28/2004); Correction hammer toe (Bilateral); Toe Surgery; Hammer toe surgery (Bilateral, 04/20/2013); Dilation and curettage of uterus; Hysterosalpingogram; BREAST LIFT (Bilateral); Reduction mammaplasty (Bilateral, 2020); Carpal tunnel release; and Esophageal manometry (N/A, 02/10/2021). Family History:  Her family history includes Arthritis in her mother; Heart disease in her paternal grandmother; Lung cancer in her father and paternal grandfather; Seizures in her mother; Thyroid disease in her  maternal grandmother. Social History:   reports that she has never smoked. She has never used smokeless tobacco. She reports current alcohol use. She reports that she does  not use drugs.  Current Medications, Allergies, Past Medical History, Past Surgical History, Family History and Social History were reviewed in [employer] record.  Physical Exam: There were no vitals taken for this visit. General:   Pleasant, well developed female in no acute distress Heart : Regular rate and rhythm; no murmurs Pulm: Clear anteriorly; no wheezing Abdomen:  {BlankSingle:19197::"Distended","Ridged","Soft"}, {BlankSingle:19197::"Flat","Obese","Non-distended"} AB, {BlankSingle:19197::"Absent","Hyperactive, tinkling","Hypoactive","Sluggish","Active"} bowel sounds. {actendernessAB:27319} tenderness {anatomy; site abdomen:5010}. {BlankMultiple:19196::"Without guarding","With guarding","Without rebound","With rebound"}, No organomegaly appreciated. Rectal: {acrectalexam:27461} Extremities:  {With/without:5700}  edema. Neurologic:  Alert and  oriented x4;  No focal deficits.  Psych:  Cooperative. Normal mood and affect.   Melissa Crofts, PA-C 08/07/21

## 2021-08-09 ENCOUNTER — Ambulatory Visit: Payer: Medicare Other | Admitting: Physician Assistant

## 2021-08-17 ENCOUNTER — Other Ambulatory Visit: Payer: Self-pay | Admitting: Family Medicine

## 2021-08-17 DIAGNOSIS — E559 Vitamin D deficiency, unspecified: Secondary | ICD-10-CM

## 2021-08-21 ENCOUNTER — Telehealth: Payer: Self-pay | Admitting: Emergency Medicine

## 2021-08-21 NOTE — Telephone Encounter (Signed)
LVM for pt to rtn my call to schedule AWV with [facility] call back # [phone] 

## 2021-08-31 ENCOUNTER — Encounter: Payer: Self-pay | Admitting: Allergy

## 2021-08-31 ENCOUNTER — Ambulatory Visit: Payer: Medicare Other | Admitting: Allergy

## 2021-08-31 VITALS — BP 110/80 | HR 75 | Temp 97.3°F | Resp 18 | Ht 61.0 in | Wt 166.0 lb

## 2021-08-31 DIAGNOSIS — J310 Chronic rhinitis: Secondary | ICD-10-CM

## 2021-08-31 DIAGNOSIS — H1013 Acute atopic conjunctivitis, bilateral: Secondary | ICD-10-CM | POA: Diagnosis not present

## 2021-08-31 DIAGNOSIS — H109 Unspecified conjunctivitis: Secondary | ICD-10-CM

## 2021-08-31 DIAGNOSIS — L500 Allergic urticaria: Secondary | ICD-10-CM | POA: Diagnosis not present

## 2021-08-31 MED ORDER — RYALTRIS 665-25 MCG/ACT NA SUSP: 2.0000 | Freq: Two times a day (BID) | NASAL | 2 refills | Status: DC

## 2021-08-31 MED ORDER — LEVOCETIRIZINE DIHYDROCHLORIDE 5 MG PO TABS: 5.0000 mg | ORAL_TABLET | Freq: Every evening | ORAL | 2 refills | Status: DC

## 2021-08-31 MED ORDER — OLOPATADINE HCL 0.2 % OP SOLN: 1.0000 [drp] | Freq: Every day | OPHTHALMIC | 2 refills | Status: AC | PRN

## 2021-08-31 NOTE — Patient Instructions (Signed)
-   Stop taking: Claritin or Zyrtec, Flonase and Astelin - Start taking: Xyzal (levocetirizine) '5mg'$  tablet once daily.  This is an antihistamine to replace Claritin/Zyrtec.   Ryaltris (olopatadine/mometasone) two sprays per nostril 2 times daily as needed.  This is a combination spray to help with congestion and drainage.  Pataday (olopatadine) one drop per eye daily as needed for itchy/watery eyes.  - You can use an extra dose of the antihistamine, if needed, for breakthrough symptoms.  - Consider nasal saline rinses 1-2 times daily to remove allergens from the nasal cavities as well as help with mucous clearance (this is especially helpful to do before the nasal sprays are given)   - at this time etiology of hives is most likely allergic in nature.  Hives can be caused by a variety of different triggers including illness/infection, foods, medications, stings, exercise, pressure, vibrations, extremes of temperature to name a few however majority of the time there is no identifiable trigger.   - if hives recur recommend taking Xyzal 1 tab twice a day.  If needed may need to add in Pepcid twice a day with Xyzal for hive control.    Schedule for skin testing visit for environmental allergy testing

## 2021-08-31 NOTE — Progress Notes (Signed)
New Patient Note  RE: Melissa [name] MRN: [id number] DOB: 08-Aug-1975 Date of Office Visit: 08/31/2021  Primary care provider: Horald Pollen, MD  Chief Complaint: allergies  History of present illness: Melissa [name] is a 46 y.o. female presenting today for evaluation of allergic rhinitis.  She states she has nighttime symptoms of congestion, mucus production and she wakes up as she can not clear the mucus.  She states she is constantly trying to swallow mucus in the throat.  Can have occasional itchy eyes. She does have history of acid reflux and has had esophageal dilation before.  She has tried mucinex that has not helped.  She has tried nexium and allergy medication (believes it is Claritin or Zyrtec) and does feel it might be helping a bit.  She has been taking these medications for the past 2 weeks. But she states she is still constantly blowing her nose and sounds congested when she talks.  She does not think she has any mold in the home. She does have dust mite encasings for mattress and pillows.   She states she has had some episodes of hives but has not identified what triggered the hive episodes.  The hives seem to be occurring at night and would resolve during the daytime. Hives do not leave any bruising marks behind.  No one else in household had similar symptoms.  She states once this spring while cutting trees she did get itchy.    No history asthma, eczema or food allergy.    Review of systems in the past 4 weeks: Review of Systems  Constitutional: Negative.   HENT: Negative.         See HPI  Eyes: Negative.   Respiratory: Negative.    Cardiovascular: Negative.   Gastrointestinal: Negative.   Musculoskeletal: Negative.   Skin: Negative.   Allergic/Immunologic: Negative.   Neurological: Negative.     All other systems negative unless noted above in HPI  Past medical history: Past Medical History:  Diagnosis Date   Anxiety    Arthritis     Carpal tunnel syndrome    Depression    GERD (gastroesophageal reflux disease)    Hammer toe 04/2013   bilateral 4th and 5th    Ingrown left big toenail 04/2013   Mental disorder    Neuromuscular disorder ([place])    Post term pregnancy over 40 weeks 09/21/2015   SVD (spontaneous vaginal delivery) 09/22/2015   UTI (lower urinary tract infection)     Past surgical history: Past Surgical History:  Procedure Laterality Date   BREAST LIFT Bilateral    CARPAL TUNNEL RELEASE     CORRECTION HAMMER TOE Bilateral    CRYOABLATION  2004   CYSTOSCOPY WITH HYDRODISTENSION AND BIOPSY  07/28/2004   with urethral dilatation   DILATION AND CURETTAGE OF UTERUS     DILATION AND EVACUATION  12/21/2011   Procedure: DILATATION AND EVACUATION;  Surgeon: Terrance Mass, MD;  Location: [facility];  Service: Gynecology;  Laterality: N/A;  first trimester    ESOPHAGEAL MANOMETRY N/A 02/10/2021   Procedure: ESOPHAGEAL MANOMETRY (EM);  Surgeon: Mauri Pole, MD;  Location: [facility];  Service: Endoscopy;  Laterality: N/A;   HAMMER TOE SURGERY Bilateral 04/20/2013   Procedure: BILATERAL HAMMER TOE REPAIR AND EXCISION NAIL PERMANENT LEFT FIRST;  Surgeon: Harriet Masson, DPM;  Location: [facility];  Service: Podiatry;  Laterality: Bilateral;  Hammer toe repair fourth and fifth toes bilateral feet with  screw placement fourth toes and partial nail excision left great toe   HYSTEROSALPINGOGRAM     REDUCTION MAMMAPLASTY Bilateral 2020   TOE SURGERY     ingrown toenail   WISDOM TOOTH EXTRACTION      Family history:  Family History  Problem Relation Age of Onset   Asthma Mother    Seizures Mother    Arthritis Mother    Lung cancer Father    Thyroid disease Maternal Grandmother    Heart disease Paternal Grandmother        2 CABG   Lung cancer Paternal Grandfather     Social history: Lives in a home without carpeting with gas heating and central cooling.  No pets in the  home.  There is no concern for water damage, mildew or roaches.  She is a Agricultural engineer.  She has no smoke exposure or history.   Medication List: Current Outpatient Medications  Medication Sig Dispense Refill   buPROPion (WELLBUTRIN XL) 150 MG 24 hr tablet Take 450 mg by mouth daily.     ibuprofen (ADVIL) 600 MG tablet Take 1 tablet (600 mg total) by mouth every 6 (six) hours as needed. 30 tablet 0   Insulin Pen Needle 32G X 4 MM MISC Use as directed 100 each 0   levocetirizine (XYZAL) 5 MG tablet Take 1 tablet (5 mg total) by mouth every evening. 30 tablet 2   lisinopril (ZESTRIL) 20 MG tablet Take 1 tablet (20 mg total) by mouth daily. 90 tablet 3   Olopatadine HCl (PATADAY) 0.2 % SOLN Place 1 drop into both eyes daily as needed. 2.5 mL 2   RYALTRIS 665-25 MCG/ACT SUSP Place 2 sprays into the nose in the morning and at bedtime. 29 g 2   Salicylic Acid 3 % SHAM Use daily as directed for dry scalp 236 mL 1   selenium sulfide (SELSUN) 2.5 % shampoo USE LOTION EXTERNALLY  TO AFFECTED AREA ONCE DAILY AS NEEDED IRRITATION 120 mL 0   Semaglutide (RYBELSUS) 7 MG TABS Take 7 mg by mouth daily. 90 tablet 1   tranexamic acid (LYSTEDA) 650 MG TABS tablet Take 650 mg by mouth daily.     Vitamin D, Ergocalciferol, (DRISDOL) 1.25 MG (50000 UNIT) CAPS capsule Take 1 capsule (50,000 Units total) by mouth every 7 (seven) days. 12 capsule 0   XIIDRA 5 % SOLN INSTILL 1 DROP TWICE DAILY INTO EACH EYE     Fluocinolone Acetonide Body 0.01 % OIL fluocinolone 0.01 % scalp oil and shower cap  APPLY TO SCALP AT [facility] UPON AWAKENING (Patient not taking: Reported on 08/31/2021)     lansoprazole (PREVACID SOLUTAB) 30 MG disintegrating tablet Take 1 tablet (30 mg total) by mouth 2 (two) times daily. (Patient not taking: Reported on 08/31/2021) 180 tablet 3   ondansetron (ZOFRAN-ODT) 4 MG disintegrating tablet Take 1 tablet (4 mg total) by mouth every 8 (eight) hours as needed for nausea or vomiting. (Patient not  taking: Reported on 08/31/2021) 20 tablet 0   No current facility-administered medications for this visit.    Known medication allergies: Allergies  Allergen Reactions   Bactrim [Sulfamethoxazole-Trimethoprim]      Physical examination: Blood pressure 110/80, pulse 75, temperature (!) 97.3 F (36.3 C), temperature source Temporal, resp. rate 18, height '5\' 1"'$  (1.549 m), weight 166 lb (75.3 kg), SpO2 98 %.  General: Alert, interactive, in no acute distress. HEENT: PERRLA, TMs pearly gray, turbinates moderately edematous with clear discharge, post-pharynx non erythematous. Neck: Supple  without lymphadenopathy. Lungs: Clear to auscultation without wheezing, rhonchi or rales. {no increased work of breathing. CV: Normal S1, S2 without murmurs. Abdomen: Nondistended, nontender. Skin: Warm and dry, without lesions or rashes. Extremities:  No clubbing, cyanosis or edema. Neuro:   Grossly intact.  Diagnositics/Labs: None today  Assessment and plan: Rhinoconjunctivitis   - Stop taking: Claritin or Zyrtec, Flonase and Astelin - Start taking: Xyzal (levocetirizine) '5mg'$  tablet once daily.  This is an antihistamine to replace Claritin/Zyrtec.   Ryaltris (olopatadine/mometasone) two sprays per nostril 2 times daily as needed.  This is a combination spray to help with congestion and drainage.  Pataday (olopatadine) one drop per eye daily as needed for itchy/watery eyes.  - You can use an extra dose of the antihistamine, if needed, for breakthrough symptoms.  - Consider nasal saline rinses 1-2 times daily to remove allergens from the nasal cavities as well as help with mucous clearance (this is especially helpful to do before the nasal sprays are given)  Urticaria  - at this time etiology of hives is most likely allergic in nature.  Hives can be caused by a variety of different triggers including illness/infection, foods, medications, stings, exercise, pressure, vibrations, extremes of  temperature to name a few however majority of the time there is no identifiable trigger.   - if hives recur recommend taking Xyzal 1 tab twice a day.  If needed may need to add in Pepcid twice a day with Xyzal for hive control.    Schedule for skin testing visit for environmental allergy testing   I appreciate the opportunity to take part in Keniah's care. Please do not hesitate to contact me with questions.  Sincerely,   Prudy Feeler, MD Allergy/Immunology [facility] [place] [facility] [place]

## 2021-09-01 ENCOUNTER — Ambulatory Visit: Payer: Medicare Other | Admitting: Nurse Practitioner

## 2021-09-01 ENCOUNTER — Ambulatory Visit: Payer: Medicare Other | Admitting: Internal Medicine

## 2021-09-06 ENCOUNTER — Telehealth: Payer: Self-pay | Admitting: *Deleted

## 2021-09-06 NOTE — Telephone Encounter (Signed)
PA has been submitted through CoverMyMeds for Ryaltris and is currently pending approval/denial.  

## 2021-09-08 NOTE — Telephone Encounter (Signed)
PA has been approved for Ryaltris. PA has been faxed to patients pharmacy, labeled, and placed in bulk scanning.  

## 2021-09-19 ENCOUNTER — Encounter: Payer: Self-pay | Admitting: Podiatry

## 2021-09-19 ENCOUNTER — Ambulatory Visit: Payer: Medicare Other | Admitting: Podiatry

## 2021-09-19 DIAGNOSIS — L600 Ingrowing nail: Secondary | ICD-10-CM | POA: Diagnosis not present

## 2021-09-19 DIAGNOSIS — M2041 Other hammer toe(s) (acquired), right foot: Secondary | ICD-10-CM | POA: Diagnosis not present

## 2021-09-19 DIAGNOSIS — M2042 Other hammer toe(s) (acquired), left foot: Secondary | ICD-10-CM

## 2021-09-19 MED ORDER — NEOMYCIN-POLYMYXIN-HC 1 % OT SOLN: OTIC | 1 refills | Status: DC

## 2021-09-19 NOTE — Progress Notes (Signed)
Presents today for follow-up of her painful bursitis fifth toes bilaterally.  She is also complaining of a painful ingrown toenail to the fibular border of the hallux left.  He states that exquisitely tender and she would like to have that border removed.  Objective: We removed this border in the past but it is thick and tender to her.  Pulses are palpable.  She has pain to the fifth toes with bursitis at the level of the PIPJ's.  Assessment: Pain in limb secondary to the bursitis fifth digits bilateral and ingrown toenail border fibular hallux left.  Plan: Chemical matricectomy was performed today.  I deferred the injection to the fifth toes until next visit.  I will follow-up with her in 2 weeks.  She was given both oral and written home-going instructions for the care of soaking of that matrixectomy fibular border hallux left.  She tolerated the procedure well without complications.

## 2021-09-19 NOTE — Patient Instructions (Signed)

## 2021-09-25 NOTE — Patient Instructions (Incomplete)
Allergic rhinitis At today's visit, your skin testing was positive to tree pollen and mold Allergen avoidance measures are listed below Continue Xyzal 5 mg once a day as needed for a runny nose or itch Continue Ryaltris 2 sprays in each nostril up to twice a day as needed for nasal symptoms Consider saline nasal rinses as needed for nasal symptoms. Use this before any medicated nasal sprays for best result Consider allergen immunotherapy if your symptoms are not well controlled with the treatment plan as listed above  Allergic conjunctivitis Some over the counter eye drops include Pataday one drop in each eye once a day as needed for red, itchy eyes OR Zaditor one drop in each eye twice a day as needed for red itchy eyes.   Hives Use the least amount of medications possible while remaining hive free Levocetirizine (Xyzal) 5 mg twice a day and famotidine (Pepcid) 20 mg twice a day. If no symptoms for 7-14 days then decrease to. Levocetirizine (Xyzal) 5 mg twice a day and famotidine (Pepcid) 20 mg once a day.  If no symptoms for 7-14 days then decrease to. Levocetirizine (Xyzal) 5 mg twice a day.  If no symptoms for 7-14 days then decrease to. Levocetirizine (Xyzal) 5 mg once a day.  If your symptoms re-occur, begin a journal of events that occurred for up to 6 hours before your symptoms began including foods and beverages consumed, soaps or perfumes you had contact with, and medications.    Call the clinic if this treatment plan is not working well for you  Follow up in 3 months or sooner if needed.  Reducing Pollen Exposure The American Academy of Allergy, Asthma and Immunology suggests the following steps to reduce your exposure to pollen during allergy seasons. Do not hang sheets or clothing out to dry; pollen may collect on these items. Do not mow lawns or spend time around freshly cut grass; mowing stirs up pollen. Keep windows closed at night.  Keep car windows closed while  driving. Minimize morning activities outdoors, a time when pollen counts are usually at their highest. Stay indoors as much as possible when pollen counts or humidity is high and on windy days when pollen tends to remain in the air longer. Use air conditioning when possible.  Many air conditioners have filters that trap the pollen spores. Use a HEPA room air filter to remove pollen form the indoor air you breathe.  Control of Mold Allergen Mold and fungi can grow on a variety of surfaces provided certain temperature and moisture conditions exist.  Outdoor molds grow on plants, decaying vegetation and soil.  The major outdoor mold, Alternaria and Cladosporium, are found in very high numbers during hot and dry conditions.  Generally, a late Summer - Fall peak is seen for common outdoor fungal spores.  Rain will temporarily lower outdoor mold spore count, but counts rise rapidly when the rainy period ends.  The most important indoor molds are Aspergillus and Penicillium.  Dark, humid and poorly ventilated basements are ideal sites for mold growth.  The next most common sites of mold growth are the bathroom and the kitchen.  Outdoor [employer] Use air conditioning and keep windows closed Avoid exposure to decaying vegetation. Avoid leaf raking. Avoid grain handling. Consider wearing a face mask if working in moldy areas.  Indoor Mold Control Maintain humidity below 50%. Clean washable surfaces with 5% bleach solution. Remove sources e.g. Contaminated carpets.

## 2021-09-25 NOTE — Progress Notes (Unsigned)
   Melissa [name] [id number] Dept: [phone]  FOLLOW UP NOTE  Patient ID: Melissa [name], female    DOB: November 13, 1975  Age: 46 y.o. MRN: [id number] Date of Office Visit: 09/26/2021  Assessment  Chief Complaint: No chief complaint on file.  HPI Melissa [name] is a 46 year old female who presents to the clinic for follow-up visit.  She was last seen in this clinic on 08/31/2021 by Dr. Nelva Bush for evaluation of allergic rhinitis and urticaria.   Drug Allergies:  Allergies  Allergen Reactions   Bactrim [Sulfamethoxazole-Trimethoprim]     Physical Exam: There were no vitals taken for this visit.   Physical Exam  Diagnostics:    Assessment and Plan: No diagnosis found.  No orders of the defined types were placed in this encounter.   There are no Patient Instructions on file for this visit.  No follow-ups on file.    Thank you for the opportunity to care for this patient.  Please do not hesitate to contact me with questions.  Gareth Morgan, FNP [facility] and [facility]

## 2021-09-26 ENCOUNTER — Ambulatory Visit (INDEPENDENT_AMBULATORY_CARE_PROVIDER_SITE_OTHER): Payer: Medicare Other | Admitting: Family Medicine

## 2021-09-26 ENCOUNTER — Encounter: Payer: Self-pay | Admitting: Family Medicine

## 2021-09-26 VITALS — BP 110/72 | HR 84 | Temp 98.0°F | Resp 18

## 2021-09-26 DIAGNOSIS — L500 Allergic urticaria: Secondary | ICD-10-CM

## 2021-09-26 DIAGNOSIS — J301 Allergic rhinitis due to pollen: Secondary | ICD-10-CM

## 2021-09-26 DIAGNOSIS — H1010 Acute atopic conjunctivitis, unspecified eye: Secondary | ICD-10-CM

## 2021-09-26 DIAGNOSIS — H1013 Acute atopic conjunctivitis, bilateral: Secondary | ICD-10-CM

## 2021-09-28 ENCOUNTER — Ambulatory Visit: Payer: Medicare Other | Admitting: Podiatry

## 2021-09-29 DIAGNOSIS — R350 Frequency of micturition: Secondary | ICD-10-CM | POA: Diagnosis not present

## 2021-10-11 ENCOUNTER — Ambulatory Visit: Payer: Medicare Other | Admitting: Emergency Medicine

## 2021-10-11 ENCOUNTER — Ambulatory Visit (INDEPENDENT_AMBULATORY_CARE_PROVIDER_SITE_OTHER): Payer: Medicare Other

## 2021-10-11 VITALS — BP 116/84 | HR 96 | Temp 97.6°F | Ht 61.0 in | Wt 155.0 lb

## 2021-10-11 DIAGNOSIS — Z0000 Encounter for general adult medical examination without abnormal findings: Secondary | ICD-10-CM | POA: Diagnosis not present

## 2021-10-11 NOTE — Progress Notes (Signed)
Subjective:   Melissa [name] is a 46 y.o. female who presents for Medicare Annual (Subsequent) preventive examination.  Review of Systems    No ROS. Medicare Wellness Visit. Additional risk factors are reflected in social history. Cardiac Risk Factors include: none     Objective:    Today's Vitals   10/11/21 1442  BP: 116/84  Pulse: 96  Temp: 97.6 F (36.4 C)  TempSrc: Temporal  SpO2: 100%  Weight: 155 lb (70.3 kg)  Height: '5\' 1"'$  (1.549 m)   Body mass index is 29.29 kg/m.     10/11/2021    2:58 PM 08/29/2020    2:27 PM 09/21/2015    3:34 PM 06/17/2014   10:50 AM 05/17/2014   11:57 AM 01/27/2014    2:57 PM 04/17/2013    9:27 AM  Advanced Directives  Does Patient Have a Medical Advance Directive? Yes Yes No No No No Patient does not have advance directive  Type of Advance Directive [place];Living will Living will       Does patient want to make changes to medical advance directive? No - Patient declined        Copy of [place] in Chart? No - copy requested        Would patient like information on creating a medical advance directive?   No - patient declined information No - patient declined information No - patient declined information Yes - Educational materials given     Current Medications (verified) Outpatient Encounter Medications as of 10/11/2021  Medication Sig   amphetamine-dextroamphetamine (ADDERALL) 20 MG tablet Take 20 mg by mouth 3 (three) times daily.   buPROPion (WELLBUTRIN XL) 150 MG 24 hr tablet Take 450 mg by mouth daily.   DULoxetine (CYMBALTA) 60 MG capsule Take 60 mg by mouth daily.   escitalopram (LEXAPRO) 5 MG tablet Take 5 mg by mouth daily.   Fluocinolone Acetonide Body 0.01 % OIL    ibuprofen (ADVIL) 600 MG tablet Take 1 tablet (600 mg total) by mouth every 6 (six) hours as needed.   Insulin Pen Needle 32G X 4 MM MISC Use as directed   lansoprazole (PREVACID SOLUTAB) 30 MG disintegrating tablet Take  1 tablet (30 mg total) by mouth 2 (two) times daily.   levocetirizine (XYZAL) 5 MG tablet Take 1 tablet (5 mg total) by mouth every evening.   lisinopril (ZESTRIL) 20 MG tablet Take 1 tablet (20 mg total) by mouth daily.   NEOMYCIN-POLYMYXIN-HYDROCORTISONE (CORTISPORIN) 1 % SOLN OTIC solution Apply 1-2 drops to toe BID after soaking   Olopatadine HCl (PATADAY) 0.2 % SOLN Place 1 drop into both eyes daily as needed.   ondansetron (ZOFRAN-ODT) 4 MG disintegrating tablet Take 1 tablet (4 mg total) by mouth every 8 (eight) hours as needed for nausea or vomiting.   RYALTRIS [id number] MCG/ACT SUSP Place 2 sprays into the nose in the morning and at bedtime.   Salicylic Acid 3 % SHAM Use daily as directed for dry scalp   selenium sulfide (SELSUN) 2.5 % shampoo USE LOTION EXTERNALLY  TO AFFECTED AREA ONCE DAILY AS NEEDED IRRITATION   Semaglutide (RYBELSUS) 7 MG TABS Take 7 mg by mouth daily.   tranexamic acid (LYSTEDA) 650 MG TABS tablet Take 650 mg by mouth daily.   Vitamin D, Ergocalciferol, (DRISDOL) 1.25 MG (50000 UNIT) CAPS capsule Take 1 capsule (50,000 Units total) by mouth every 7 (seven) days.   XIIDRA 5 % SOLN INSTILL 1 DROP TWICE  DAILY INTO EACH EYE   No facility-administered encounter medications on file as of 10/11/2021.    Allergies (verified) Bactrim [sulfamethoxazole-trimethoprim]   History: Past Medical History:  Diagnosis Date   Anxiety    Arthritis    Carpal tunnel syndrome    Depression    GERD (gastroesophageal reflux disease)    Hammer toe 04/2013   bilateral 4th and 5th    Ingrown left big toenail 04/2013   Mental disorder    Neuromuscular disorder ([place])    Post term pregnancy over 40 weeks 09/21/2015   SVD (spontaneous vaginal delivery) 09/22/2015   UTI (lower urinary tract infection)    Past Surgical History:  Procedure Laterality Date   BREAST LIFT Bilateral    CARPAL TUNNEL RELEASE     CORRECTION HAMMER TOE Bilateral    CRYOABLATION  2004   CYSTOSCOPY WITH  HYDRODISTENSION AND BIOPSY  07/28/2004   with urethral dilatation   DILATION AND CURETTAGE OF UTERUS     DILATION AND EVACUATION  12/21/2011   Procedure: DILATATION AND EVACUATION;  Surgeon: Terrance Mass, MD;  Location: [place] [facility];  Service: Gynecology;  Laterality: N/A;  first trimester    ESOPHAGEAL MANOMETRY N/A 02/10/2021   Procedure: ESOPHAGEAL MANOMETRY (EM);  Surgeon: Mauri Pole, MD;  Location: [facility];  Service: Endoscopy;  Laterality: N/A;   HAMMER TOE SURGERY Bilateral 04/20/2013   Procedure: BILATERAL HAMMER TOE REPAIR AND EXCISION NAIL PERMANENT LEFT FIRST;  Surgeon: Harriet Masson, DPM;  Location: [place];  Service: Podiatry;  Laterality: Bilateral;  Hammer toe repair fourth and fifth toes bilateral feet with screw placement fourth toes and partial nail excision left great toe   HYSTEROSALPINGOGRAM     REDUCTION MAMMAPLASTY Bilateral 2020   TOE SURGERY     ingrown toenail   WISDOM TOOTH EXTRACTION     Family History  Problem Relation Age of Onset   Asthma Mother    Seizures Mother    Arthritis Mother    Lung cancer Father    Thyroid disease Maternal Grandmother    Heart disease Paternal Grandmother        2 CABG   Lung cancer Paternal Grandfather    Social History   Socioeconomic History   Marital status: Single    Spouse name: Not on file   Number of children: 1   Years of education: Not on file   Highest education level: Not on file  Occupational History   Occupation: Homemaker  Tobacco Use   Smoking status: Never    Passive exposure: Never   Smokeless tobacco: Never  Vaping Use   Vaping Use: Never used  Substance and Sexual Activity   Alcohol use: Yes    Comment: occasionally   Drug use: No   Sexual activity: Yes    Birth control/protection: I.U.D.  Other Topics Concern   Not on file  Social History Narrative   Not on file   Social Determinants of Health   Financial Resource Strain: Low Risk   (10/11/2021)   Overall Financial Resource Strain (CARDIA)    Difficulty of Paying Living Expenses: Not hard at all  Food Insecurity: No Food Insecurity (10/11/2021)   Hunger Vital Sign    Worried About Running Out of Food in the Last Year: Never true    Ran Out of Food in the Last Year: Never true  Transportation Needs: No Transportation Needs (10/11/2021)   PRAPARE - Hydrologist (Medical): No  Lack of Transportation (Non-Medical): No  Physical Activity: Sufficiently Active (10/11/2021)   Exercise Vital Sign    Days of Exercise per Week: 3 days    Minutes of Exercise per Session: 60 min  Stress: No Stress Concern Present (10/11/2021)   [place]    Feeling of Stress : Not at all  Social Connections: Unknown (10/11/2021)   Social Connection and Isolation Panel [NHANES]    Frequency of Communication with Friends and Family: More than three times a week    Frequency of Social Gatherings with Friends and Family: More than three times a week    Attends Religious Services: More than 4 times per year    Active Member of [employer] or Organizations: No    Attends Music therapist: Never    Marital Status: Patient refused    Tobacco Counseling Counseling given: Not Answered   Clinical Intake:  Pre-visit preparation completed: No  Pain : No/denies pain     Nutritional Status: BMI 25 -29 Overweight Nutritional Risks: None Diabetes: No  How often do you need to have someone help you when you read instructions, pamphlets, or other written materials from your doctor or pharmacy?: 1 - Never What is the last grade level you completed in school?: 12th grade  Interpreter Needed?: No  Information entered by :: Jillene Bucks, [place]   Activities of Daily Living    10/11/2021    2:58 PM  In your present state of health, do you have any difficulty performing the following  activities:  Hearing? 0  Vision? 0  Difficulty concentrating or making decisions? 0  Comment sometimes  Walking or climbing stairs? 0  Dressing or bathing? 0  Doing errands, shopping? 0  Preparing Food and eating ? N  Using the Toilet? N  In the past six months, have you accidently leaked urine? N  Do you have problems with loss of bowel control? N  Managing your Medications? N  Managing your Finances? N  Housekeeping or managing your Housekeeping? N    Patient Care Team: Horald Pollen, MD as PCP - General (Internal Medicine) [facility], [facility] as Consulting Physician (Urology)  Indicate any recent Medical Services you may have received from other than [facility] providers in the past year (date may be approximate).     Assessment:   This is a routine wellness examination for [place].  Hearing/Vision screen Patient denied any hearing difficulty. No hearing aids. Patient does not wear any corrective lenses/contacts.   Dietary issues and exercise activities discussed: Current Exercise Habits: Home exercise routine, Type of exercise: strength training/weights;stretching, Time (Minutes): 60, Frequency (Times/Week): 3, Weekly Exercise (Minutes/Week): 180, Intensity: Moderate, Exercise limited by: None identified   Goals Addressed             This Visit's Progress    Patient Stated       To continue working on losing weight.        Depression Screen    10/11/2021    2:57 PM 06/28/2021    1:21 PM 08/29/2020    2:32 PM 10/09/2016    4:10 PM 12/12/2015    8:14 AM 06/17/2014   10:50 AM 05/17/2014   11:57 AM  PHQ 2/9 Scores  PHQ - 2 Score 0 0 0 0 0 0 0  PHQ- 9 Score    12     Exception Documentation      Other- indicate reason in comment box  Fall Risk    10/11/2021    2:58 PM 06/28/2021    1:21 PM 08/29/2020    2:26 PM 10/09/2016    3:19 PM 12/12/2015    8:14 AM  Fall Risk   Falls in the past year? 0 0 0 No No  Number falls in past yr: 0  0     Injury with Fall? 0  0    Risk for fall due to : No Fall Risks  No Fall Risks    Follow up Falls evaluation completed  Falls evaluation completed      [place]:  Any stairs in or around the home? Yes  If so, are there any without handrails? No  Home free of loose throw rugs in walkways, pet beds, electrical cords, etc? No  Adequate lighting in your home to reduce risk of falls? Yes   ASSISTIVE DEVICES UTILIZED TO PREVENT FALLS:  Life alert? No  Use of a cane, walker or w/c? No  Grab bars in the bathroom? No  Shower chair or bench in shower? No  Elevated toilet seat or a handicapped toilet? No   TIMED UP AND GO:  Was the test performed? No .  Length of time to ambulate 10 feet: N/A sec.   Patient stated that she has no issues with gait or balance; does not use any assistive devices.  Cognitive Function:  Patient is cogitatively intact.      10/11/2021    2:59 PM  6CIT Screen  What Year? 0 points  What month? 0 points  What time? 0 points  Count back from 20 0 points  Months in reverse 0 points  Repeat phrase 0 points  Total Score 0 points    Immunizations Immunization History  Administered Date(s) Administered   Influenza,inj,Quad PF,6+ Mos 09/20/2020   PFIZER(Purple Top)SARS-COV-2 Vaccination 04/06/2019, 04/27/2019   PPD Test 06/26/2013   Rho (D) Immune Globulin 01/22/2011, 12/21/2011   Tdap 09/23/2015, 06/09/2021    TDAP status: Up to date  Flu Vaccine status: Declined, Education has been provided regarding the importance of this vaccine but patient still declined. Advised may receive this vaccine at local pharmacy or Health Dept. Aware to provide a copy of the vaccination record if obtained from local pharmacy or Health Dept. Verbalized acceptance and understanding.  Covid-19 vaccine status: Completed vaccines  Qualifies for Shingles Vaccine? No   Zostavax completed No   Shingrix Completed?: No.    Education has been  provided regarding the importance of this vaccine. Patient has been advised to call insurance company to determine out of pocket expense if they have not yet received this vaccine. Advised may also receive vaccine at local pharmacy or Health Dept. Verbalized acceptance and understanding.  Screening Tests Health Maintenance  Topic Date Due   PAP SMEAR-Modifier  02/16/2019   COLONOSCOPY (Pts 45-35yr Insurance coverage will need to be confirmed)  Never done   TETANUS/TDAP  06/10/2031   Hepatitis C Screening  Completed   HIV Screening  Completed   HPV VACCINES  Aged Out   INFLUENZA VACCINE  Discontinued   COVID-19 Vaccine  Discontinued    Health Maintenance  Health Maintenance Due  Topic Date Due   PAP SMEAR-Modifier  02/16/2019   COLONOSCOPY (Pts 45-420yrInsurance coverage will need to be confirmed)  Never done    Colorectal Cancer screening: due  Mammogram status: ?   Lung Cancer Screening: (Low Dose CT Chest recommended if Age 46-80ears,  30 pack-year currently smoking OR have quit w/in 15years.) does not qualify.   Lung Cancer Screening Referral: N/A  Additional Screening:  Hepatitis C Screening: does qualify; Completed 09/25/2011  Vision Screening: Recommended annual ophthalmology exams for early detection of glaucoma and other disorders of the eye. Is the patient up to date with their annual eye exam?  Yes  Who is the provider or what is the name of the office in which the patient attends annual eye exams? Patient does not remember the name of her eye dr but knows it is on [address]. If pt is not established with a provider, would they like to be referred to a provider to establish care? No .   Dental Screening: Recommended annual dental exams for proper oral hygiene  Community Resource Referral / Chronic Care Management: CRR required this visit?  No   CCM required this visit?  No      Plan:     I have personally reviewed and noted the following in the patient's  chart:   Medical and social history Use of alcohol, tobacco or illicit drugs  Current medications and supplements including opioid prescriptions. Patient is not currently taking opioid prescriptions. Functional ability and status Nutritional status Physical activity Advanced directives List of other physicians Hospitalizations, surgeries, and ER visits in previous 12 months Vitals Screenings to include cognitive, depression, and falls Referrals and appointments  In addition, I have reviewed and discussed with patient certain preventive protocols, quality metrics, and best practice recommendations. A written personalized care plan for preventive services as well as general preventive health recommendations were provided to patient.     Rossie Muskrat, [place]   10/11/2021   Nurse Notes: N/A

## 2021-10-11 NOTE — Patient Instructions (Addendum)
  Please schedule your next Medicare Wellness Visit with your Nurse Health Advisor in 1 year by calling [phone]. 

## 2021-10-12 ENCOUNTER — Other Ambulatory Visit: Payer: Self-pay | Admitting: Family Medicine

## 2021-10-16 DIAGNOSIS — N951 Menopausal and female climacteric states: Secondary | ICD-10-CM | POA: Insufficient documentation

## 2021-11-03 ENCOUNTER — Telehealth: Payer: Self-pay | Admitting: Allergy

## 2021-11-03 ENCOUNTER — Other Ambulatory Visit (HOSPITAL_COMMUNITY): Payer: Self-pay

## 2021-11-03 ENCOUNTER — Other Ambulatory Visit: Payer: Self-pay | Admitting: *Deleted

## 2021-11-03 MED ORDER — RYALTRIS 665-25 MCG/ACT NA SUSP: 2.0000 | Freq: Two times a day (BID) | NASAL | 10 refills | Status: DC

## 2021-11-03 NOTE — Telephone Encounter (Signed)
Pt requesting refill for Ryaltris, she was told she needed a PA but the PA team states a PA is not needed

## 2021-11-03 NOTE — Telephone Encounter (Signed)
Pt states a PA is needed for Ryaltris

## 2021-11-03 NOTE — Telephone Encounter (Signed)
New RX sent to [facility].

## 2021-11-03 NOTE — Telephone Encounter (Signed)
Further inquiry into this message showed that the test claim for the Ryaltris does not need a PA at this time.

## 2021-11-04 ENCOUNTER — Other Ambulatory Visit: Payer: Self-pay | Admitting: Emergency Medicine

## 2021-11-07 MED ORDER — RYALTRIS 665-25 MCG/ACT NA SUSP: 2.0000 | Freq: Two times a day (BID) | NASAL | 10 refills | Status: DC

## 2021-11-07 NOTE — Telephone Encounter (Signed)
Per [employer] - Ryaltris [id number] MCG?ACT has been approved through 12/31/21...  Approved on September 6 Request Reference Number: [id number]. RYALTRIS SPR 025-48 is approved through 12/31/2021. Your patient may now fill this prescription and it will be covered.   Resending prescription to [facility].  Called patient - DOB verified - advised of the above notation. Patient verbalized understanding, no further questions.

## 2021-11-07 NOTE — Telephone Encounter (Signed)
Patient states [facility] does not accept her insurance and needs [facility] [place] [place] [address]  Patient is requesting a call once prescription is sent in, [phone]

## 2021-11-14 ENCOUNTER — Encounter: Payer: Self-pay | Admitting: Podiatry

## 2021-11-14 ENCOUNTER — Ambulatory Visit (INDEPENDENT_AMBULATORY_CARE_PROVIDER_SITE_OTHER): Payer: Medicare Other | Admitting: Podiatry

## 2021-11-14 DIAGNOSIS — M7751 Other enthesopathy of right foot: Secondary | ICD-10-CM

## 2021-11-14 DIAGNOSIS — M7752 Other enthesopathy of left foot: Secondary | ICD-10-CM

## 2021-11-14 MED ORDER — DEXAMETHASONE SODIUM PHOSPHATE 120 MG/30ML IJ SOLN
4.0000 mg | Freq: Once | INTRAMUSCULAR | Status: AC
  Administered 2021-11-14: 4 mg via INTRA_ARTICULAR

## 2021-11-14 NOTE — Progress Notes (Signed)
She presents today for follow-up of her bursitis fifth digits bilaterally.  She states that things time for another injection.  Objective: Vital signs stable alert oriented x3 there is no erythema edema cellulitis drainage or odor.  She does have fluctuance on palpation of the PIPJ's dorsal lateral aspect fifth toes bilateral.  Assessment: Bursitis fifth digits bilateral PIPJ's most likely shoe gear associated.  Plan: I injected the area today dexamethasone local anesthetic 2 milligrams local anesthetic.  She tolerated procedure well follow-up with her as needed

## 2021-11-16 ENCOUNTER — Telehealth: Payer: Self-pay | Admitting: Emergency Medicine

## 2021-11-16 NOTE — Telephone Encounter (Signed)
A1c result sent to [place].

## 2021-11-30 ENCOUNTER — Other Ambulatory Visit: Payer: Self-pay | Admitting: Emergency Medicine

## 2021-11-30 ENCOUNTER — Other Ambulatory Visit: Payer: Self-pay | Admitting: Gastroenterology

## 2021-11-30 ENCOUNTER — Other Ambulatory Visit: Payer: Self-pay | Admitting: Podiatry

## 2021-11-30 ENCOUNTER — Other Ambulatory Visit: Payer: Self-pay | Admitting: Family Medicine

## 2021-11-30 DIAGNOSIS — E669 Obesity, unspecified: Secondary | ICD-10-CM

## 2021-11-30 DIAGNOSIS — E559 Vitamin D deficiency, unspecified: Secondary | ICD-10-CM

## 2021-12-11 ENCOUNTER — Other Ambulatory Visit: Payer: Self-pay | Admitting: Emergency Medicine

## 2021-12-11 ENCOUNTER — Telehealth: Payer: Self-pay | Admitting: Family Medicine

## 2021-12-11 DIAGNOSIS — G8929 Other chronic pain: Secondary | ICD-10-CM

## 2021-12-11 DIAGNOSIS — E669 Obesity, unspecified: Secondary | ICD-10-CM

## 2021-12-11 DIAGNOSIS — M25531 Pain in right wrist: Secondary | ICD-10-CM

## 2021-12-11 NOTE — Telephone Encounter (Signed)
Patient called asking if Dr Tamala Julian could put in a referral for her right thumb to a surgeon?  She said she thinks it is time to start that process.

## 2021-12-12 NOTE — Telephone Encounter (Signed)
Referral has been placed. 

## 2021-12-13 NOTE — Progress Notes (Signed)
[name] [place] Phone: [phone] Subjective:   Rito Ehrlich, am serving as a scribe for Dr. Hulan Saas.  I'm seeing this patient by the request  of:  Sagardia, Ines Bloomer, MD  CC: Neck and back follow-up  [id number]  Melissa [name] is a 46 y.o. female coming in with complaint of back and neck pain. OMT 07/18/2021. Patient states that her right arm thumb and elbow is bothering her and wants an injection. Other than that routine OMT.   Medications patient has been prescribed: None  Taking:         Reviewed prior external information including notes and imaging from previsou exam, outside providers and external EMR if available.   As well as notes that were available from care everywhere and other healthcare systems.  Past medical history, social, surgical and family history all reviewed in electronic medical record.  No pertanent information unless stated regarding to the chief complaint.   Past Medical History:  Diagnosis Date   Anxiety    Arthritis    Carpal tunnel syndrome    Depression    GERD (gastroesophageal reflux disease)    Hammer toe 04/2013   bilateral 4th and 5th    Ingrown left big toenail 04/2013   Mental disorder    Neuromuscular disorder ([place])    Post term pregnancy over 40 weeks 09/21/2015   SVD (spontaneous vaginal delivery) 09/22/2015   UTI (lower urinary tract infection)     Allergies  Allergen Reactions   Bactrim [Sulfamethoxazole-Trimethoprim]      Review of Systems:  No headache, visual changes, nausea, vomiting, diarrhea, constipation, dizziness, abdominal pain, skin rash, fevers, chills, night sweats, weight loss, swollen lymph nodes, body aches, joint swelling, chest pain, shortness of breath, mood changes. POSITIVE muscle aches  Objective  Blood pressure 110/78, pulse 92, height '5\' 1"'$  (1.549 m), weight 145 lb (65.8 kg), SpO2 97 %.   General: No apparent  distress alert and oriented x3 mood and affect normal, dressed appropriately.  HEENT: Pupils equal, extraocular movements intact  Respiratory: Patient's speak in full sentences and does not appear short of breath  Cardiovascular: No lower extremity edema, non tender, no erythema  Neck exam does have some loss of lordosis.  Patient does have some tightness noted in the paraspinal musculature.  Patient's back exam does have some loss of lordosis.  Patient does have tenderness to palpation in the wrist with a positive Wynn Maudlin noted.  Patient does have severe pain over the radial nerve in the common extensor muscle belly. Osteopathic findings  C2 flexed rotated and side bent right C6 flexed rotated and side bent left T3 extended rotated and side bent right inhaled rib T9 extended rotated and side bent left L2 flexed rotated and side bent right Sacrum right on right   After verbal consent patient was prepped with alcohol swab.  A 25-gauge half inch needle injected into the right abductor pollicis longus tendon sheath with a total of 0.5 cc of 0.5% Marcaine and 0.5 cc of Kenalog 40 mg/mL.  After verbal consent patient was prepped with alcohol swab and with a 25-gauge half inch needle injected into the common extensor muscle belly near the radial nerve of most tenderness with 0.5 cc of 0.5% Marcaine and 0.5 cc of Kenalog 40 mg per    Assessment and Plan:  Radial tunnel syndrome Patient given injection and I will see if the radial nerve entrapment could be  potentially playing a role.  Discussed with patient icing regimen at home.  I still think Patient's underlying autoimmune could be playing a potential role as well.  Discussed icing regimen and home exercises.  Increase activity slowly.  Follow-up again in 6 to 8 weeks  De Quervain's disease (radial styloid tenosynovitis) Patient given injection and tolerated the procedure well, discussed icing regimen and home exercises, discussed which  activities to do and which ones to avoid.  Increase activity slowly otherwise.  Follow-up again in 6 to 8 weeks    Nonallopathic problems  Decision today to treat with OMT was based on Physical Exam  After verbal consent patient was treated with HVLA, ME, FPR techniques in cervical, rib, thoracic, lumbar, and sacral  areas  Patient tolerated the procedure well with improvement in symptoms  Patient given exercises, stretches and lifestyle modifications  See medications in patient instructions if given  Patient will follow up in 4-8 weeks    The above documentation has been reviewed and is accurate and complete Lyndal Pulley, DO          Note: This dictation was prepared with Dragon dictation along with smaller phrase technology. Any transcriptional errors that result from this process are unintentional.

## 2021-12-15 ENCOUNTER — Encounter: Payer: Self-pay | Admitting: Family Medicine

## 2021-12-15 ENCOUNTER — Ambulatory Visit (INDEPENDENT_AMBULATORY_CARE_PROVIDER_SITE_OTHER): Payer: Medicare Other | Admitting: Family Medicine

## 2021-12-15 ENCOUNTER — Ambulatory Visit: Payer: Medicare Other | Admitting: Family Medicine

## 2021-12-15 VITALS — BP 110/78 | HR 92 | Ht 61.0 in | Wt 145.0 lb

## 2021-12-15 DIAGNOSIS — M654 Radial styloid tenosynovitis [de Quervain]: Secondary | ICD-10-CM | POA: Diagnosis not present

## 2021-12-15 DIAGNOSIS — M9901 Segmental and somatic dysfunction of cervical region: Secondary | ICD-10-CM

## 2021-12-15 DIAGNOSIS — M9903 Segmental and somatic dysfunction of lumbar region: Secondary | ICD-10-CM | POA: Diagnosis not present

## 2021-12-15 DIAGNOSIS — G5630 Lesion of radial nerve, unspecified upper limb: Secondary | ICD-10-CM

## 2021-12-15 DIAGNOSIS — M9902 Segmental and somatic dysfunction of thoracic region: Secondary | ICD-10-CM

## 2021-12-15 DIAGNOSIS — M9908 Segmental and somatic dysfunction of rib cage: Secondary | ICD-10-CM

## 2021-12-15 DIAGNOSIS — M9904 Segmental and somatic dysfunction of sacral region: Secondary | ICD-10-CM | POA: Diagnosis not present

## 2021-12-15 DIAGNOSIS — M2550 Pain in unspecified joint: Secondary | ICD-10-CM

## 2021-12-15 MED ORDER — KETOROLAC TROMETHAMINE 30 MG/ML IJ SOLN
30.0000 mg | Freq: Once | INTRAMUSCULAR | Status: AC
  Administered 2021-12-15: 30 mg via INTRAMUSCULAR

## 2021-12-15 MED ORDER — METHYLPREDNISOLONE ACETATE 40 MG/ML IJ SUSP
40.0000 mg | Freq: Once | INTRAMUSCULAR | Status: AC
  Administered 2021-12-15: 40 mg via INTRAMUSCULAR

## 2021-12-15 NOTE — Assessment & Plan Note (Signed)
Worsening pain all over and Toradol and Depo-Medrol given today

## 2021-12-15 NOTE — Assessment & Plan Note (Signed)
Patient given injection and I will see if the radial nerve entrapment could be potentially playing a role.  Discussed with patient icing regimen at home.  I still think Patient's underlying autoimmune could be playing a potential role as well.  Discussed icing regimen and home exercises.  Increase activity slowly.  Follow-up again in 6 to 8 weeks

## 2021-12-15 NOTE — Patient Instructions (Signed)
Good to see you  Injection in backside today  Injection in elbow and in wrist today  Follow up in 6-8 weeks

## 2021-12-15 NOTE — Assessment & Plan Note (Signed)
Patient given injection and tolerated the procedure well, discussed icing regimen and home exercises, discussed which activities to do and which ones to avoid.  Increase activity slowly otherwise.  Follow-up again in 6 to 8 weeks

## 2021-12-21 ENCOUNTER — Telehealth: Payer: Self-pay | Admitting: Allergy

## 2021-12-21 MED ORDER — RYALTRIS 665-25 MCG/ACT NA SUSP: 2.0000 | Freq: Two times a day (BID) | NASAL | 10 refills | Status: AC

## 2021-12-21 NOTE — Telephone Encounter (Signed)
I HAVE SENT IN [facility] TO [facility] [address]

## 2021-12-21 NOTE — Telephone Encounter (Signed)
Patient called and would like her [facility] [address] [phone]

## 2022-01-05 ENCOUNTER — Other Ambulatory Visit: Payer: Self-pay | Admitting: Podiatry

## 2022-01-11 ENCOUNTER — Other Ambulatory Visit: Payer: Self-pay | Admitting: Allergy

## 2022-01-11 ENCOUNTER — Ambulatory Visit: Payer: Medicare Other | Admitting: Allergy

## 2022-01-16 ENCOUNTER — Encounter: Payer: Self-pay | Admitting: Podiatry

## 2022-01-16 ENCOUNTER — Ambulatory Visit (INDEPENDENT_AMBULATORY_CARE_PROVIDER_SITE_OTHER): Payer: Medicare Other | Admitting: Podiatry

## 2022-01-16 ENCOUNTER — Other Ambulatory Visit: Payer: Self-pay | Admitting: Podiatry

## 2022-01-16 ENCOUNTER — Ambulatory Visit (INDEPENDENT_AMBULATORY_CARE_PROVIDER_SITE_OTHER): Payer: Medicare Other

## 2022-01-16 VITALS — BP 137/81 | HR 96

## 2022-01-16 DIAGNOSIS — T8460XA Infection and inflammatory reaction due to internal fixation device of unspecified site, initial encounter: Secondary | ICD-10-CM

## 2022-01-16 DIAGNOSIS — M2042 Other hammer toe(s) (acquired), left foot: Secondary | ICD-10-CM

## 2022-01-16 DIAGNOSIS — M7752 Other enthesopathy of left foot: Secondary | ICD-10-CM

## 2022-01-16 DIAGNOSIS — M7751 Other enthesopathy of right foot: Secondary | ICD-10-CM

## 2022-01-16 NOTE — Progress Notes (Signed)
She presents today with a chief complaint of a painful fourth toe and hallux left.  She states that the fibular border she points to it is painful where the matrixectomy was performed previously.  She is also complaining of painful internal fixation to that area as she points to where the screws were for the [facility] osteotomy.  She does say that her fourth toe is painful with shoes and she has to have something done about it.  Objective: Vital signs are stable she is alert and oriented x 3 there is some mild edema to the fourth toe but otherwise there is no erythema cellulitis drainage or odor.  Radiographs taken today demonstrate 2 screws to the proximal phalanx of the hallux with prominent screw heads and these are Integra screws off she does have a retained Ortho Pro screw to the fourth toe left appears to be a 2.0 with some wallowing out of the cortex around the head and neck of the screw.  Most consistent with loosening or osteophytic change.  Assessment: Painful internal fixation fourth toe left foot hallux left foot and ingrown toenail fibular border hallux left foot.  Plan: Discussed etiology pathology conservative or surgical therapies at this point were going to go back to the operating room remove all of the internal fixation and surgically remove the fibular border of the hallux nail left.  We discussed the possible postop complications which may include but not limited to postop pain bleeding swell infection recurrence need for further surgery overcorrection under correction loss of digit loss limb loss of life loss of nail permanently.  She understands all this is amenable to it we will follow-up with me on an as-needed basis.

## 2022-01-25 NOTE — Progress Notes (Signed)
Melissa [name] Phone: [phone] Subjective:   Melissa [name], am serving as a scribe for Dr. Hulan Saas.  I'm seeing this patient by the request  of:  Sagardia, Ines Bloomer, MD  CC: Multiple complaint follow-up  [id number]  Melissa [name] is a 47 y.o. female coming in with complaint of back and neck pain. OMT on 12/15/2021. Also seen for R wrist pain. Patient states that she is doing fine since last visit. R elbow pain started to burn more recently. Tx last visit helped quite a bit.   Medications patient has been prescribed: None  Taking:         Reviewed prior external information including notes and imaging from previsou exam, outside providers and external EMR if available.   As well as notes that were available from care everywhere and other healthcare systems.  Past medical history, social, surgical and family history all reviewed in electronic medical record.  [name] pertanent information unless stated regarding to the chief complaint.   Past Medical History:  Diagnosis Date   Anxiety    Arthritis    Carpal tunnel syndrome    Depression    GERD (gastroesophageal reflux disease)    Hammer toe 04/2013   bilateral 4th and 5th    Ingrown left big toenail 04/2013   Mental disorder    Neuromuscular disorder ([place])    Post term pregnancy over 40 weeks 09/21/2015   SVD (spontaneous vaginal delivery) 09/22/2015   UTI (lower urinary tract infection)     Allergies  Allergen Reactions   Bactrim [Sulfamethoxazole-Trimethoprim]      Review of Systems:  [name] headache, visual changes, nausea, vomiting, diarrhea, constipation, dizziness, abdominal pain, skin rash, fevers, chills, night sweats, weight loss, swollen lymph nodes,  joint swelling, chest pain, shortness of breath, mood changes. POSITIVE muscle aches, body aches  Objective  Blood pressure 118/84, pulse 68, height '5\' 1"'$  (1.549 m), weight 142 lb  (64.4 kg), SpO2 98 %.   General: [name] apparent distress alert and oriented x3 mood and affect normal, dressed appropriately.  HEENT: Pupils equal, extraocular movements intact  Respiratory: Patient's speak in full sentences and does not appear short of breath  Cardiovascular: [name] lower extremity edema, non tender, [name] erythema  Low back exam does have significant loss of lordosis.  Neck exam has significant difficulty with sidebending bilaterally right greater than left.  Patient does have even may be some positive Spurling's noted on the right side.  Some difficulty getting out of a chair and the patient has today.  Osteopathic findings  C2 flexed rotated and side bent right C7 flexed rotated and side bent right T3 extended rotated and side bent right inhaled rib T9 extended rotated and side bent left L2 flexed rotated and side bent right Sacrum right on right       Assessment and Plan:  Bilateral low back pain with right-sided sciatica Continues to have some difficulty.  Chronic problem with exacerbation.  Toradol and Depo-Medrol given again today.  May need to consider increasing or changing some of the different medications such as Cymbalta.  Continue to work on core strengthening.  Follow-up again in 6 to 8 weeks  Radial tunnel syndrome Patient examined at the radial tunnel.  Will consider the possibility of injection if needed again.  6 to 8 weeks before the weekend.    Nonallopathic problems  Decision today to treat with OMT was based on Physical  Exam  After verbal consent patient was treated with HVLA, ME, FPR techniques in cervical, rib, thoracic, lumbar, and sacral  areas  Patient tolerated the procedure well with improvement in symptoms  Patient given exercises, stretches and lifestyle modifications  See medications in patient instructions if given  Patient will follow up in 4-8 weeks     The above documentation has been reviewed and is accurate and complete  Lyndal Pulley, DO         Note: This dictation was prepared with Dragon dictation along with smaller phrase technology. Any transcriptional errors that result from this process are unintentional.

## 2022-01-26 ENCOUNTER — Encounter: Payer: Self-pay | Admitting: Family Medicine

## 2022-01-26 ENCOUNTER — Ambulatory Visit (INDEPENDENT_AMBULATORY_CARE_PROVIDER_SITE_OTHER): Payer: Medicare Other | Admitting: Family Medicine

## 2022-01-26 VITALS — BP 118/84 | HR 68 | Ht 61.0 in | Wt 142.0 lb

## 2022-01-26 DIAGNOSIS — G8929 Other chronic pain: Secondary | ICD-10-CM | POA: Diagnosis not present

## 2022-01-26 DIAGNOSIS — M9901 Segmental and somatic dysfunction of cervical region: Secondary | ICD-10-CM | POA: Diagnosis not present

## 2022-01-26 DIAGNOSIS — G5630 Lesion of radial nerve, unspecified upper limb: Secondary | ICD-10-CM | POA: Diagnosis not present

## 2022-01-26 DIAGNOSIS — M9903 Segmental and somatic dysfunction of lumbar region: Secondary | ICD-10-CM | POA: Diagnosis not present

## 2022-01-26 DIAGNOSIS — M9908 Segmental and somatic dysfunction of rib cage: Secondary | ICD-10-CM

## 2022-01-26 DIAGNOSIS — M9904 Segmental and somatic dysfunction of sacral region: Secondary | ICD-10-CM | POA: Diagnosis not present

## 2022-01-26 DIAGNOSIS — M9902 Segmental and somatic dysfunction of thoracic region: Secondary | ICD-10-CM | POA: Diagnosis not present

## 2022-01-26 DIAGNOSIS — M5441 Lumbago with sciatica, right side: Secondary | ICD-10-CM

## 2022-01-26 MED ORDER — KETOROLAC TROMETHAMINE 60 MG/2ML IM SOLN
60.0000 mg | Freq: Once | INTRAMUSCULAR | Status: AC
  Administered 2022-01-26: 60 mg via INTRAMUSCULAR

## 2022-01-26 MED ORDER — METHYLPREDNISOLONE ACETATE 80 MG/ML IJ SUSP
80.0000 mg | Freq: Once | INTRAMUSCULAR | Status: AC
  Administered 2022-01-26: 80 mg via INTRAMUSCULAR

## 2022-01-26 NOTE — Assessment & Plan Note (Signed)
Continues to have some difficulty.  Chronic problem with exacerbation.  Toradol and Depo-Medrol given again today.  May need to consider increasing or changing some of the different medications such as Cymbalta.  Continue to work on core strengthening.  Follow-up again in 6 to 8 weeks

## 2022-01-26 NOTE — Assessment & Plan Note (Signed)
Patient examined at the radial tunnel.  Will consider the possibility of injection if needed again.  6 to 8 weeks before the weekend.

## 2022-01-26 NOTE — Patient Instructions (Signed)
Injections in backside today See me in 7-8 weeks

## 2022-01-29 DIAGNOSIS — R3915 Urgency of urination: Secondary | ICD-10-CM | POA: Diagnosis not present

## 2022-01-29 DIAGNOSIS — Z1211 Encounter for screening for malignant neoplasm of colon: Secondary | ICD-10-CM | POA: Diagnosis not present

## 2022-01-29 DIAGNOSIS — Z1231 Encounter for screening mammogram for malignant neoplasm of breast: Secondary | ICD-10-CM | POA: Diagnosis not present

## 2022-01-29 DIAGNOSIS — R61 Generalized hyperhidrosis: Secondary | ICD-10-CM | POA: Diagnosis not present

## 2022-01-30 ENCOUNTER — Ambulatory Visit
Admission: EM | Admit: 2022-01-30 | Discharge: 2022-01-30 | Disposition: A | Discharge status: Home / Self Care | Payer: Medicare Other | Attending: Internal Medicine | Admitting: Internal Medicine

## 2022-01-30 ENCOUNTER — Other Ambulatory Visit: Payer: Self-pay

## 2022-01-30 ENCOUNTER — Encounter: Payer: Self-pay | Admitting: *Deleted

## 2022-01-30 DIAGNOSIS — J069 Acute upper respiratory infection, unspecified: Secondary | ICD-10-CM

## 2022-01-30 DIAGNOSIS — R053 Chronic cough: Secondary | ICD-10-CM

## 2022-01-30 DIAGNOSIS — J029 Acute pharyngitis, unspecified: Secondary | ICD-10-CM

## 2022-01-30 DIAGNOSIS — D1722 Benign lipomatous neoplasm of skin and subcutaneous tissue of left arm: Secondary | ICD-10-CM | POA: Diagnosis not present

## 2022-01-30 DIAGNOSIS — R3915 Urgency of urination: Secondary | ICD-10-CM | POA: Diagnosis not present

## 2022-01-30 LAB — POCT RAPID STREP A (OFFICE): Rapid Strep A Screen: NEGATIVE

## 2022-01-30 MED ORDER — PREDNISONE 20 MG PO TABS: 40.0000 mg | ORAL_TABLET | Freq: Every day | ORAL | 0 refills | Status: AC

## 2022-01-30 NOTE — Discharge Instructions (Signed)
Strep test is negative. Throat culture is pending.  Suspect persistent viral illness.  I prescribed prednisone to help alleviate symptoms.  Please follow-up symptoms persist or worsen.

## 2022-01-30 NOTE — ED Provider Notes (Signed)
[facility]    CSN: [id number] Arrival date & time: 01/30/22  1215      History   Chief Complaint Chief Complaint  Patient presents with   Cough   Sore Throat   Nasal Congestion    HPI Melissa [name] is a 47 y.o. female.   Patient presents with 2.5-week history of cough, fever, nasal congestion, sore throat.  Her daughter has had similar symptoms.  Patient not sure of Tmax at home.  Has had several over-the-counter cold and flu medications with minimal improvement in symptoms.  Denies chest pain, shortness of breath, nausea, vomiting, diarrhea, abdominal pain.  Denies history of asthma or COPD and patient does not smoke cigarettes.   Cough Sore Throat    Past Medical History:  Diagnosis Date   Anxiety    Arthritis    Carpal tunnel syndrome    Depression    GERD (gastroesophageal reflux disease)    Hammer toe 04/2013   bilateral 4th and 5th    Ingrown left big toenail 04/2013   Mental disorder    Neuromuscular disorder ([place])    Post term pregnancy over 40 weeks 09/21/2015   SVD (spontaneous vaginal delivery) 09/22/2015   UTI (lower urinary tract infection)     Patient Active Problem List   Diagnosis Date Noted   Menopausal symptom 10/16/2021   Fatigue 07/06/2021   Acute non-recurrent pansinusitis 07/06/2021   Hematuria 07/06/2021   Breast tenderness 07/06/2021   Nausea 07/06/2021   Essential hypertension 06/28/2021   Chronic pain of both knees 06/28/2021   Weight gain 02/08/2021   Family history of thyroid disease 02/08/2021   Gastroesophageal reflux disease 11/29/2020   Dysphagia 84/13/2440   History of Helicobacter pylori infection 11/29/2020   De Quervain's disease (radial styloid tenosynovitis) 03/31/2020   Nonallopathic lesion of lumbosacral region 03/01/2019   Nonallopathic lesion of cervical region 03/01/2019   Nonallopathic lesion of sacral region 03/01/2019   Nonallopathic lesion of thoracic region 03/01/2019   Nonallopathic lesion  of rib cage 03/01/2019   Vitamin D deficiency 05/08/2018   Right lateral epicondylitis 04/15/2017   Piriformis syndrome, left 02/25/2017   Mixed stress and urge urinary incontinence 02/07/2016   Carpal tunnel syndrome 01/03/2016   Polyarthralgia 01/03/2016   Galactocele associated with childbirth 11/07/2015   Radial tunnel syndrome 06/17/2014   Cervical radiculopathy 06/10/2014   Bilateral low back pain with right-sided sciatica 06/10/2014   Bipolar depression ([place]) 05/17/2014   Chronic night sweats 06/26/2013   History of recurrent spontaneous abortion, not currently pregnant 01/26/2012   Obesity without serious comorbidity 01/25/2012    Past Surgical History:  Procedure Laterality Date   BREAST LIFT Bilateral    CARPAL TUNNEL RELEASE     CORRECTION HAMMER TOE Bilateral    CRYOABLATION  2004   CYSTOSCOPY WITH HYDRODISTENSION AND BIOPSY  07/28/2004   with urethral dilatation   DILATION AND CURETTAGE OF UTERUS     DILATION AND EVACUATION  12/21/2011   Procedure: DILATATION AND EVACUATION;  Surgeon: Terrance Mass, MD;  Location: [facility];  Service: Gynecology;  Laterality: N/A;  first trimester    ESOPHAGEAL MANOMETRY N/A 02/10/2021   Procedure: ESOPHAGEAL MANOMETRY (EM);  Surgeon: Mauri Pole, MD;  Location: [facility];  Service: Endoscopy;  Laterality: N/A;   HAMMER TOE SURGERY Bilateral 04/20/2013   Procedure: BILATERAL HAMMER TOE REPAIR AND EXCISION NAIL PERMANENT LEFT FIRST;  Surgeon: Harriet Masson, DPM;  Location: [place];  Service: Podiatry;  Laterality: Bilateral;  Hammer toe repair fourth and fifth toes bilateral feet with screw placement fourth toes and partial nail excision left great toe   HYSTEROSALPINGOGRAM     REDUCTION MAMMAPLASTY Bilateral 2020   TOE SURGERY     ingrown toenail   WISDOM TOOTH EXTRACTION      OB History     Gravida  6   Para  1   Term  1   Preterm  0   AB  5   Living  1      SAB  2    IAB  1   Ectopic  2   Multiple  0   Live Births  1            Home Medications    Prior to Admission medications   Medication Sig Start Date End Date Taking? Authorizing Provider  predniSONE (DELTASONE) 20 MG tablet Take 2 tablets (40 mg total) by mouth daily for 5 days. 01/30/22 02/04/22 Yes , Michele Rockers, FNP  amphetamine-dextroamphetamine (ADDERALL) 20 MG tablet Take 20 mg by mouth 3 (three) times daily. 09/06/21   [provider]  BD PEN NEEDLE NANO 2ND GEN 32G X 4 MM MISC USE AS DIRECTED 11/06/21   Horald Pollen, MD  buPROPion (WELLBUTRIN XL) 150 MG 24 hr tablet Take 450 mg by mouth daily. 02/27/21   [provider]  DULoxetine (CYMBALTA) 60 MG capsule Take 60 mg by mouth daily. 09/14/21   [provider]  escitalopram (LEXAPRO) 5 MG tablet Take 5 mg by mouth daily. 07/03/21   [provider]  Fluocinolone Acetonide Body 0.01 % OIL     [provider]  ibuprofen (ADVIL) 600 MG tablet Take 1 tablet (600 mg total) by mouth every 6 (six) hours as needed. 06/09/21   Melynda Ripple, MD  lansoprazole (PREVACID SOLUTAB) 30 MG disintegrating tablet TAKE 1 TABLET BY MOUTH 2 TIMES DAILY. 12/01/21   Zehr, Laban Emperor, PA-C  levocetirizine (XYZAL) 5 MG tablet TAKE 1 TABLET BY MOUTH EVERY DAY IN THE EVENING 01/11/22   Kennith Gain, MD  lisinopril (ZESTRIL) 20 MG tablet Take 1 tablet (20 mg total) by mouth daily. 06/28/21   Horald Pollen, MD  NEOMYCIN-POLYMYXIN-HYDROCORTISONE (CORTISPORIN) 1 % SOLN OTIC solution APPLY 1-2 DROPS TO TOE TWICE DAILY AFTER SOAKING 01/09/22   Hyatt, Max T, DPM  Olopatadine HCl (PATADAY) 0.2 % SOLN Place 1 drop into both eyes daily as needed. 08/31/21   Kennith Gain, MD  ondansetron (ZOFRAN-ODT) 4 MG disintegrating tablet Take 1 tablet (4 mg total) by mouth every 8 (eight) hours as needed for nausea or vomiting. 07/06/21   Henson, Vickie L, NP-C  PARoxetine (PAXIL) 10 MG tablet Take 10 mg by  mouth daily. 10/12/21   [provider]  RYALTRIS [phone] MCG/ACT SUSP Place 2 sprays into the nose in the morning and at bedtime. 12/21/21   Ambs, Kathrine Cords, FNP  RYBELSUS 7 MG TABS TAKE 7 MG BY MOUTH DAILY. 12/11/21   Horald Pollen, MD  Salicylic Acid 3 % SHAM Use daily as directed for dry scalp 09/23/17   Marrian Salvage, FNP  selenium sulfide (SELSUN) 2.5 % shampoo USE LOTION EXTERNALLY  TO AFFECTED AREA ONCE DAILY AS NEEDED IRRITATION 02/15/20   Marrian Salvage, FNP  tranexamic acid (LYSTEDA) 650 MG TABS tablet Take 650 mg by mouth daily. 01/04/21   [provider]  venlafaxine XR (EFFEXOR-XR) 37.5 MG 24 hr capsule TAKE 1  CAPSULE BY MOUTH DAILY WITH BREAKFAST. 10/12/21   Lyndal Pulley, DO  Vitamin D, Ergocalciferol, (DRISDOL) 1.25 MG (50000 UNIT) CAPS capsule Take 1 capsule (50,000 Units total) by mouth every 7 (seven) days. 07/06/21   Henson, Laurian Brim, NP-C  XIIDRA 5 % SOLN INSTILL 1 DROP TWICE DAILY INTO [facility] EYE 02/02/20   [provider]    Family History Family History  Problem Relation Age of Onset   Asthma Mother    Seizures Mother    Arthritis Mother    Lung cancer Father    Thyroid disease Maternal Grandmother    Heart disease Paternal Grandmother        2 CABG   Lung cancer Paternal Grandfather     Social History Social History   Tobacco Use   Smoking status: Never    Passive exposure: Never   Smokeless tobacco: Never  Vaping Use   Vaping Use: Never used  Substance Use Topics   Alcohol use: Yes    Comment: occasionally   Drug use: No     Allergies   Bactrim [sulfamethoxazole-trimethoprim]   Review of Systems Review of Systems Per HPI  Physical Exam Triage Vital Signs ED Triage Vitals  Enc Vitals Group     BP 01/30/22 1254 107/71     Pulse Rate 01/30/22 1254 82     Resp --      Temp 01/30/22 1254 98.1 F (36.7 C)     Temp src --      SpO2 01/30/22 1254 98 %     Weight --      Height --      Head  Circumference --      Peak Flow --      Pain Score 01/30/22 1255 0     Pain Loc --      Pain Edu? --      Excl. in [place]? --    No data found.  Updated Vital Signs BP 107/71   Pulse 82   Temp 98.1 F (36.7 C)   SpO2 98%   Visual Acuity Right Eye Distance:   Left Eye Distance:   Bilateral Distance:    Right Eye Near:   Left Eye Near:    Bilateral Near:     Physical Exam Constitutional:      General: She is not in acute distress.    Appearance: Normal appearance. She is not toxic-appearing or diaphoretic.  HENT:     Head: Normocephalic and atraumatic.     Right Ear: Tympanic membrane and ear canal normal.     Left Ear: Tympanic membrane and ear canal normal.     Nose: Congestion present.     Mouth/Throat:     Mouth: Mucous membranes are moist.     Pharynx: Posterior oropharyngeal erythema present.  Eyes:     Extraocular Movements: Extraocular movements intact.     Conjunctiva/sclera: Conjunctivae normal.     Pupils: Pupils are equal, round, and reactive to light.  Cardiovascular:     Rate and Rhythm: Normal rate and regular rhythm.     Pulses: Normal pulses.     Heart sounds: Normal heart sounds.  Pulmonary:     Effort: Pulmonary effort is normal. No respiratory distress.     Breath sounds: Normal breath sounds. No stridor. No wheezing, rhonchi or rales.  Chest:     Chest wall: No tenderness.  Abdominal:     General: Abdomen is flat. Bowel sounds are normal.  Palpations: Abdomen is soft.  Musculoskeletal:        General: Normal range of motion.     Cervical back: Normal range of motion.  Skin:    General: Skin is warm and dry.  Neurological:     General: No focal deficit present.     Mental Status: She is alert and oriented to person, place, and time. Mental status is at baseline.  Psychiatric:        Mood and Affect: Mood normal.        Behavior: Behavior normal.      UC Treatments / Results  Labs (all labs ordered are listed, but only abnormal  results are displayed) Labs Reviewed  CULTURE, GROUP A STREP [facility])  POCT RAPID STREP A (OFFICE)    EKG   Radiology No results found.  Procedures Procedures (including critical care time)  Medications Ordered in UC Medications - No data to display  Initial Impression / Assessment and Plan / UC Course  I have reviewed the triage vital signs and the nursing notes.  Pertinent labs & imaging results that were available during my care of the patient were reviewed by me and considered in my medical decision making (see chart for details).     Patient presents with persistent viral symptoms.  There is no indication for antibiotic therapy on physical exam.  No adventitious lung sounds on exam so do not think that chest imaging is necessary.  Rapid strep was negative.  Throat culture pending.  Do not think viral testing is necessary given duration of symptoms as it would not change treatment.  Will prescribe prednisone steroid burst to help alleviate symptoms.  Possible viral bronchitis.  States she has taken steroids before and tolerated well.  She denies that she takes any daily medications so this should be safe as well.  No contraindications to steroids noted in patient's history.  Discussed return precautions.  Patient verbalized understanding and was agreeable with plan. Final Clinical Impressions(s) / UC Diagnoses   Final diagnoses:  Acute upper respiratory infection  Persistent cough  Sore throat     Discharge Instructions      Strep test is negative. Throat culture is pending.  Suspect persistent viral illness.  I prescribed prednisone to help alleviate symptoms.  Please follow-up symptoms persist or worsen.     ED Prescriptions     Medication Sig Dispense Auth. Provider   predniSONE (DELTASONE) 20 MG tablet Take 2 tablets (40 mg total) by mouth daily for 5 days. 10 tablet Teodora Medici, [place]      PDMP not reviewed this encounter.   Teodora Medici, [place] 01/30/22  1346

## 2022-01-30 NOTE — ED Triage Notes (Signed)
For 21/2 weeks Pt reports a cough,fever,nasal congestion. Pt returned from [facility] 2weeks ago.

## 2022-02-01 ENCOUNTER — Telehealth: Payer: Self-pay

## 2022-02-01 LAB — CULTURE, GROUP A STREP (THRC)

## 2022-02-01 NOTE — Telephone Encounter (Signed)
Patient called in  - DOB verified- requested to cancel appointment for tomorrow due tbeing Dx Bronchitis. Patient stated she will call back to reschedule once she's feeling better.  F/U Appt for tomorrow - 02/02/22 @ 10 am w/Dr. Nelva Bush for her 3 mth office visit follow up has been canceled.

## 2022-02-02 ENCOUNTER — Ambulatory Visit: Payer: Medicare Other | Admitting: Allergy

## 2022-02-02 ENCOUNTER — Telehealth (HOSPITAL_COMMUNITY): Payer: Self-pay | Admitting: Emergency Medicine

## 2022-02-02 MED ORDER — AMOXICILLIN 500 MG PO CAPS: 500.0000 mg | ORAL_CAPSULE | Freq: Two times a day (BID) | ORAL | 0 refills | Status: AC

## 2022-02-21 ENCOUNTER — Other Ambulatory Visit: Payer: Self-pay | Admitting: Family Medicine

## 2022-02-21 DIAGNOSIS — E559 Vitamin D deficiency, unspecified: Secondary | ICD-10-CM

## 2022-02-21 NOTE — Telephone Encounter (Signed)
Sent patient MyChart message to see if she is still taking medication.

## 2022-02-28 ENCOUNTER — Ambulatory Visit: Payer: Self-pay | Admitting: Surgery

## 2022-02-28 DIAGNOSIS — R2232 Localized swelling, mass and lump, left upper limb: Secondary | ICD-10-CM | POA: Diagnosis not present

## 2022-03-02 ENCOUNTER — Telehealth: Payer: Self-pay | Admitting: *Deleted

## 2022-03-02 NOTE — Telephone Encounter (Signed)
PA for Rybelsus submitted, awaiting response Key: [id number]

## 2022-03-06 NOTE — Telephone Encounter (Signed)
PA for Rybelsus denied  Per [facility] Request Reference Number: [id number]. RYBELSUS TAB '7MG'$  is denied due to Plan Exclusion

## 2022-03-09 ENCOUNTER — Ambulatory Visit: Payer: Medicare Other | Admitting: Family Medicine

## 2022-03-14 ENCOUNTER — Encounter: Payer: Self-pay | Admitting: Emergency Medicine

## 2022-03-14 ENCOUNTER — Other Ambulatory Visit: Payer: Self-pay | Admitting: Emergency Medicine

## 2022-03-14 MED ORDER — ZEPBOUND 5 MG/0.5ML ~~LOC~~ SOAJ: 5.0000 mg | SUBCUTANEOUS | 5 refills | Status: DC

## 2022-03-14 NOTE — Telephone Encounter (Signed)
Called patient and informed her that the PA was denied due to her Medicare part D not covering any weight loss medications. Patient still insist on submitting an appeal. Appeal has been started will update patient on the determination

## 2022-03-14 NOTE — Telephone Encounter (Signed)
New prescription for Zepbound sent to pharmacy of record.  Thanks.

## 2022-03-14 NOTE — Telephone Encounter (Signed)
Please advise Rybelsus denied, patient wants a rx sent for Zepbound.

## 2022-03-14 NOTE — Telephone Encounter (Addendum)
Pt has stated she is wanting to know if an appeal was started for this medication. Pt states she should be able to get coverage for March as the law for the switch of coverage for the Rybelsus was to start in April.  Pt is requesting that she gets a call back with this information.

## 2022-03-15 ENCOUNTER — Encounter (HOSPITAL_BASED_OUTPATIENT_CLINIC_OR_DEPARTMENT_OTHER): Payer: Self-pay | Admitting: Surgery

## 2022-03-15 NOTE — Progress Notes (Signed)
Spoke w/ via phone for pre-op interview--- Melissa [name] needs dos----  EKG, ISTAT and UPT per anesthesia             [name] results------ COVID test -----patient states asymptomatic no test needed Arrive at -------1145 NPO after MN NO Solid Food.  Clear liquids from MN until---1045 Med rec completed Medications to take morning of surgery -----NONE Diabetic medication ----- Patient instructed no nail polish to be worn day of surgery Patient instructed to bring photo id and insurance card day of surgery Patient aware to have Driver (ride ) / caregiver  Brother  for 24 hours after surgery  Patient Special Instructions ----- Pre-Op special Istructions ----- Patient verbalized understanding of instructions that were given at this phone interview. Patient denies shortness of breath, chest pain, fever, cough at this phone interview.

## 2022-03-15 NOTE — Telephone Encounter (Signed)
Called patient to inform her that a new rx was sent to her pharmacy  

## 2022-03-19 DIAGNOSIS — Z01818 Encounter for other preprocedural examination: Secondary | ICD-10-CM

## 2022-03-19 DIAGNOSIS — Z8679 Personal history of other diseases of the circulatory system: Secondary | ICD-10-CM

## 2022-03-25 ENCOUNTER — Encounter (HOSPITAL_BASED_OUTPATIENT_CLINIC_OR_DEPARTMENT_OTHER): Payer: Self-pay | Admitting: Surgery

## 2022-03-25 DIAGNOSIS — R2232 Localized swelling, mass and lump, left upper limb: Secondary | ICD-10-CM | POA: Diagnosis present

## 2022-03-25 NOTE — H&P (Signed)
REFERRING PHYSICIAN: Nelta Numbers.  PROVIDER: Robey Massmann Charlotta Newton, MD   Chief Complaint: New Consultation (Mass left posterior arm)  History of Present Illness:  Patient presents on referral from Dr. Dayton Martes in dermatology for surgical evaluation and recommendations regarding subcutaneous soft tissue masses involving both upper extremities. Patient has a soft tissue mass on the posterior aspect of the left upper arm. This was noted by her mother. It was first noted approximately 2 months ago. It may have gradually enlarged in size. Patient is also noted smaller masses in both the left and right upper arms. She denies any previous such lesions. She has had no recent history of trauma. She presents today to discuss surgical excision.  Review of Systems: A complete review of systems was obtained from the patient. I have reviewed this information and discussed as appropriate with the patient. See HPI as well for other ROS.  Review of Systems Constitutional: Negative. HENT: Negative. Eyes: Negative. Respiratory: Negative. Cardiovascular: Negative. Gastrointestinal: Negative. Genitourinary: Negative. Musculoskeletal: Negative. Skin: Soft tissue masses bilateral upper arms Neurological: Negative. Endo/Heme/Allergies: Negative. Psychiatric/Behavioral: Negative.   Medical History: Past Medical History: Diagnosis Date Anxiety Arthritis GERD (gastroesophageal reflux disease)  Patient Active Problem List Diagnosis Mass of left upper extremity  Past Surgical History: Procedure Laterality Date ESOPHAGEAL MANOMETRY Hammer toe surgery REDUCTION MAMMAPLASTY   Allergies Allergen Reactions Sulfa (Sulfonamide Antibiotics) Other (See Comments) Sulfamethoxazole-Trimethoprim Unknown  Current Outpatient Medications on File Prior to Visit Medication Sig Dispense Refill buPROPion (WELLBUTRIN XL) 150 MG XL tablet TAKE THREE IN THE MORNING buPROPion (WELLBUTRIN XL) 300  MG XL tablet Take 1 tablet by mouth every morning dextroamphetamine-amphetamine (ADDERALL) 20 mg tablet Take 1 tablet by mouth 3 (three) times daily DULoxetine (CYMBALTA) 60 MG DR capsule Take 1 capsule by mouth every morning ergocalciferol, vitamin D2, 1,250 mcg (50,000 unit) capsule Take 50,000 Units by mouth every 7 (seven) days  No current facility-administered medications on file prior to visit.  Family History Problem Relation Age of Onset Obesity Mother   Social History  Tobacco Use Smoking Status Never Smokeless Tobacco Never   Social History  Socioeconomic History Marital status: Single Tobacco Use Smoking status: Never Smokeless tobacco: Never Substance and Sexual Activity Alcohol use: Never Drug use: Never  Objective:  Vitals: BP: 110/70 Pulse: 80 Temp: 36.7 C (98.1 F) SpO2: 98% Weight: 63.5 kg (140 lb) Height: 154.9 cm (5\' 1" )  Body mass index is 26.45 kg/m.  Physical Exam  GENERAL APPEARANCE Comfortable, no acute issues Development: normal Gross deformities: none  SKIN Rash, lesions, ulcers: none Induration, erythema: none Nodules: On the posterior left upper arm is a 3 x 3.5 x 1.5 cm soft tissue mass which is relatively firm, slightly fixed to the underlying tissue, and mildly tender to palpation. On the anterior lateral portion of the arm at the same level there is a 1.5 cm subcutaneous mass which is relatively firm, smooth, discrete, and mildly tender. On the right upper arm are several 1 cm or less subcutaneous nodules.  EYES Conjunctiva and lids: normal Pupils: equal and reactive  EARS, NOSE, MOUTH, THROAT External ears: no lesion or deformity External nose: no lesion or deformity Hearing: grossly normal  NECK Symmetric: yes Trachea: midline Thyroid: no palpable nodules in the thyroid bed  CHEST Respiratory effort: normal Retraction or accessory muscle use: no Breath sounds: normal bilaterally Rales, rhonchi, wheeze:  none  CARDIOVASCULAR Auscultation: regular rhythm, normal rate Murmurs: none Pulses: radial pulse 2+ palpable Lower extremity  edema: none  ABDOMEN Not assessed  GENITOURINARY/RECTAL Not assessed  MUSCULOSKELETAL Station and gait: normal Digits and nails: no clubbing or cyanosis Muscle strength: grossly normal all extremities Range of motion: grossly normal all extremities Deformity: none  LYMPHATIC Cervical: none palpable Supraclavicular: none palpable  PSYCHIATRIC Oriented to person, place, and time: yes Mood and affect: normal for situation Judgment and insight: appropriate for situation   Assessment and Plan:  Mass of left upper extremity  Patient presents on referral from her dermatologist for surgical evaluation and management of a soft tissue mass on the left upper extremity. On exam the patient actually has multiple lesions involving both upper arms. The largest mass is on the left side posteriorly and measures approximately 3-1/2 cm in greatest dimension. This should be surgically excised for definitive diagnosis and management. Patient also desires removal of a adjacent nodule which is slightly anterior and more symptomatic.  Today we discussed doing this as an outpatient surgical procedure under anesthesia. We discussed the size and location of the surgical incisions. We discussed the cosmetic closure. We will submit these to pathology for further review. The patient understands and wishes to proceed.  Armandina Gemma, MD [facility] [place] practice Office: [phone]

## 2022-03-26 NOTE — Progress Notes (Signed)
Spoke w/ via phone for pre-op interview: patient Pt updated with new day and time. NPO after MN except clear liquids until 0815.  Arrive at [facility] 03/26/21.Patient verbalized understanding of instructions that were given at this phone interview.  See previous note by Everitt Amber.

## 2022-03-26 NOTE — Progress Notes (Signed)
Unable to reach patient. Left voicemail to inform of arrival time of 0915 03/27/22 and that patient is not to eat or drink anything after midnight except for clear liquids until 0815. After this patient is to be NPO.

## 2022-03-27 ENCOUNTER — Ambulatory Visit (HOSPITAL_BASED_OUTPATIENT_CLINIC_OR_DEPARTMENT_OTHER)
Admission: RE | Admit: 2022-03-27 | Discharge: 2022-03-27 | Disposition: A | Discharge status: Home / Self Care | Payer: Medicare Other | Attending: Surgery | Admitting: Surgery

## 2022-03-27 ENCOUNTER — Other Ambulatory Visit: Payer: Self-pay

## 2022-03-27 ENCOUNTER — Ambulatory Visit (HOSPITAL_BASED_OUTPATIENT_CLINIC_OR_DEPARTMENT_OTHER): Payer: Medicare Other | Admitting: Certified Registered Nurse Anesthetist

## 2022-03-27 ENCOUNTER — Encounter (HOSPITAL_BASED_OUTPATIENT_CLINIC_OR_DEPARTMENT_OTHER)
Admission: RE | Disposition: A | Discharge status: Home / Self Care | Payer: Self-pay | Source: Home / Self Care | Attending: Surgery

## 2022-03-27 ENCOUNTER — Encounter (HOSPITAL_BASED_OUTPATIENT_CLINIC_OR_DEPARTMENT_OTHER): Payer: Self-pay | Admitting: Surgery

## 2022-03-27 DIAGNOSIS — D1722 Benign lipomatous neoplasm of skin and subcutaneous tissue of left arm: Secondary | ICD-10-CM | POA: Diagnosis not present

## 2022-03-27 DIAGNOSIS — K219 Gastro-esophageal reflux disease without esophagitis: Secondary | ICD-10-CM | POA: Diagnosis not present

## 2022-03-27 DIAGNOSIS — R2232 Localized swelling, mass and lump, left upper limb: Secondary | ICD-10-CM | POA: Diagnosis not present

## 2022-03-27 DIAGNOSIS — M7989 Other specified soft tissue disorders: Secondary | ICD-10-CM

## 2022-03-27 DIAGNOSIS — Z01818 Encounter for other preprocedural examination: Secondary | ICD-10-CM

## 2022-03-27 DIAGNOSIS — I10 Essential (primary) hypertension: Secondary | ICD-10-CM | POA: Diagnosis not present

## 2022-03-27 DIAGNOSIS — Z8679 Personal history of other diseases of the circulatory system: Secondary | ICD-10-CM

## 2022-03-27 HISTORY — PX: MASS EXCISION: SHX2000

## 2022-03-27 LAB — POCT I-STAT, CHEM 8
BUN: 11 mg/dL (ref 6–20)
Calcium, Ion: 1.25 mmol/L (ref 1.15–1.40)
Chloride: 103 mmol/L (ref 98–111)
Creatinine, Ser: 0.7 mg/dL (ref 0.44–1.00)
Glucose, Bld: 83 mg/dL (ref 70–99)
HCT: 40 % (ref 36.0–46.0)
Hemoglobin: 13.6 g/dL (ref 12.0–15.0)
Potassium: 4.2 mmol/L (ref 3.5–5.1)
Sodium: 142 mmol/L (ref 135–145)
TCO2: 26 mmol/L (ref 22–32)

## 2022-03-27 LAB — POCT PREGNANCY, URINE: Preg Test, Ur: NEGATIVE

## 2022-03-27 SURGERY — EXCISION MASS
Anesthesia: General | Site: Arm Upper | Laterality: Left

## 2022-03-27 MED ORDER — FENTANYL CITRATE (PF) 100 MCG/2ML IJ SOLN
INTRAMUSCULAR | Status: DC | PRN
  Administered 2022-03-27: 50 ug via INTRAVENOUS

## 2022-03-27 MED ORDER — PROMETHAZINE HCL 25 MG/ML IJ SOLN: 6.2500 mg | INTRAMUSCULAR | Status: DC | PRN

## 2022-03-27 MED ORDER — LIDOCAINE-EPINEPHRINE (PF) 1 %-1:200000 IJ SOLN
INTRAMUSCULAR | Status: AC
  Filled 2022-03-27: qty 30

## 2022-03-27 MED ORDER — AMISULPRIDE (ANTIEMETIC) 5 MG/2ML IV SOLN: 10.0000 mg | Freq: Once | INTRAVENOUS | Status: DC | PRN

## 2022-03-27 MED ORDER — FENTANYL CITRATE (PF) 100 MCG/2ML IJ SOLN
INTRAMUSCULAR | Status: AC
  Filled 2022-03-27: qty 2

## 2022-03-27 MED ORDER — CEFAZOLIN SODIUM-DEXTROSE 2-4 GM/100ML-% IV SOLN
2.0000 g | INTRAVENOUS | Status: AC
  Administered 2022-03-27: 2 g via INTRAVENOUS

## 2022-03-27 MED ORDER — 0.9 % SODIUM CHLORIDE (POUR BTL) OPTIME
TOPICAL | Status: DC | PRN
  Administered 2022-03-27: 300 mL

## 2022-03-27 MED ORDER — BUPIVACAINE HCL (PF) 0.5 % IJ SOLN
INTRAMUSCULAR | Status: AC
  Filled 2022-03-27: qty 30

## 2022-03-27 MED ORDER — PROPOFOL 10 MG/ML IV BOLUS
INTRAVENOUS | Status: AC
  Filled 2022-03-27: qty 20

## 2022-03-27 MED ORDER — CHLORHEXIDINE GLUCONATE CLOTH 2 % EX PADS: 6.0000 | MEDICATED_PAD | Freq: Once | CUTANEOUS | Status: DC

## 2022-03-27 MED ORDER — MIDAZOLAM HCL 5 MG/5ML IJ SOLN
INTRAMUSCULAR | Status: DC | PRN
  Administered 2022-03-27: 2 mg via INTRAVENOUS

## 2022-03-27 MED ORDER — LACTATED RINGERS IV SOLN: INTRAVENOUS | Status: DC

## 2022-03-27 MED ORDER — CEFAZOLIN SODIUM-DEXTROSE 2-4 GM/100ML-% IV SOLN
INTRAVENOUS | Status: AC
  Filled 2022-03-27: qty 100

## 2022-03-27 MED ORDER — LIDOCAINE 2% (20 MG/ML) 5 ML SYRINGE
INTRAMUSCULAR | Status: DC | PRN
  Administered 2022-03-27: 70 mg via INTRAVENOUS

## 2022-03-27 MED ORDER — OXYCODONE HCL 5 MG PO TABS: 5.0000 mg | ORAL_TABLET | Freq: Once | ORAL | Status: DC | PRN

## 2022-03-27 MED ORDER — HYDROMORPHONE HCL 1 MG/ML IJ SOLN: 0.2500 mg | INTRAMUSCULAR | Status: DC | PRN

## 2022-03-27 MED ORDER — PROPOFOL 10 MG/ML IV BOLUS
INTRAVENOUS | Status: DC | PRN
  Administered 2022-03-27: 30 mg via INTRAVENOUS
  Administered 2022-03-27: 140 mg via INTRAVENOUS

## 2022-03-27 MED ORDER — KETOROLAC TROMETHAMINE 30 MG/ML IJ SOLN
INTRAMUSCULAR | Status: AC
  Filled 2022-03-27: qty 1

## 2022-03-27 MED ORDER — ONDANSETRON HCL 4 MG/2ML IJ SOLN
INTRAMUSCULAR | Status: DC | PRN
  Administered 2022-03-27: 4 mg via INTRAVENOUS

## 2022-03-27 MED ORDER — KETOROLAC TROMETHAMINE 30 MG/ML IJ SOLN
INTRAMUSCULAR | Status: DC | PRN
  Administered 2022-03-27: 30 mg via INTRAVENOUS

## 2022-03-27 MED ORDER — OXYCODONE HCL 5 MG/5ML PO SOLN: 5.0000 mg | Freq: Once | ORAL | Status: DC | PRN

## 2022-03-27 MED ORDER — LIDOCAINE-EPINEPHRINE (PF) 1 %-1:200000 IJ SOLN
INTRAMUSCULAR | Status: DC | PRN
  Administered 2022-03-27: 7 mL

## 2022-03-27 MED ORDER — TRAMADOL HCL 50 MG PO TABS: 50.0000 mg | ORAL_TABLET | Freq: Four times a day (QID) | ORAL | 0 refills | Status: DC | PRN

## 2022-03-27 MED ORDER — MEPERIDINE HCL 25 MG/ML IJ SOLN: 6.2500 mg | INTRAMUSCULAR | Status: DC | PRN

## 2022-03-27 MED ORDER — MIDAZOLAM HCL 2 MG/2ML IJ SOLN
INTRAMUSCULAR | Status: AC
  Filled 2022-03-27: qty 2

## 2022-03-27 MED ORDER — DEXAMETHASONE SODIUM PHOSPHATE 10 MG/ML IJ SOLN
INTRAMUSCULAR | Status: DC | PRN
  Administered 2022-03-27: 10 mg via INTRAVENOUS

## 2022-03-27 SURGICAL SUPPLY — 42 items
ADH SKN CLS APL DERMABOND .7 (GAUZE/BANDAGES/DRESSINGS) ×1
APL PRP STRL LF DISP 70% ISPRP (MISCELLANEOUS) ×1
BLADE SURG 15 STRL LF DISP TIS (BLADE) ×2 IMPLANT
BLADE SURG 15 STRL SS (BLADE) ×1
CHLORAPREP W/TINT 26 (MISCELLANEOUS) ×2 IMPLANT
CLEANER CAUTERY TIP PAD (MISCELLANEOUS) IMPLANT
COVER BACK TABLE 60X90IN (DRAPES) ×2 IMPLANT
COVER MAYO STAND STRL (DRAPES) ×2 IMPLANT
DERMABOND ADVANCED .7 DNX12 (GAUZE/BANDAGES/DRESSINGS) IMPLANT
DRAPE LAPAROTOMY 100X72 PEDS (DRAPES) IMPLANT
DRAPE ORTHO SPLIT 77X108 STRL (DRAPES) ×1
DRAPE SHEET LG 3/4 BI-LAMINATE (DRAPES) IMPLANT
DRAPE SURG ORHT 6 SPLT 77X108 (DRAPES) IMPLANT
DRAPE UTILITY XL STRL (DRAPES) ×2 IMPLANT
DRSG TEGADERM 2-3/8X2-3/4 SM (GAUZE/BANDAGES/DRESSINGS) IMPLANT
DRSG TEGADERM 4X4.75 (GAUZE/BANDAGES/DRESSINGS) IMPLANT
ELECT REM PT RETURN 9FT ADLT (ELECTROSURGICAL) ×1
ELECTRODE REM PT RTRN 9FT ADLT (ELECTROSURGICAL) ×2 IMPLANT
GAUZE 4X4 16PLY ~~LOC~~+RFID DBL (SPONGE) ×2 IMPLANT
GAUZE SPONGE 4X4 12PLY STRL (GAUZE/BANDAGES/DRESSINGS) ×2 IMPLANT
GLOVE SURG ORTHO 8.0 STRL STRW (GLOVE) ×2 IMPLANT
GOWN STRL REUS W/TWL LRG LVL3 (GOWN DISPOSABLE) ×2 IMPLANT
GOWN STRL REUS W/TWL XL LVL3 (GOWN DISPOSABLE) ×2 IMPLANT
KIT TURNOVER CYSTO (KITS) ×2 IMPLANT
NDL HYPO 25X1 1.5 SAFETY (NEEDLE) ×2 IMPLANT
NEEDLE HYPO 25X1 1.5 SAFETY (NEEDLE) ×1 IMPLANT
NS IRRIG 500ML POUR BTL (IV SOLUTION) ×2 IMPLANT
PACK BASIN DAY SURGERY FS (CUSTOM PROCEDURE TRAY) ×2 IMPLANT
PENCIL SMOKE EVACUATOR (MISCELLANEOUS) ×2 IMPLANT
SLEEVE SCD COMPRESS KNEE MED (STOCKING) ×2 IMPLANT
STRIP CLOSURE SKIN 1/2X4 (GAUZE/BANDAGES/DRESSINGS) ×2 IMPLANT
SUT ETHILON 3 0 PS 1 (SUTURE) IMPLANT
SUT ETHILON 4 0 PS 2 18 (SUTURE) IMPLANT
SUT MNCRL AB 4-0 PS2 18 (SUTURE) ×2 IMPLANT
SUT VIC AB 3-0 SH 18 (SUTURE) IMPLANT
SUT VIC AB 3-0 SH 8-18 (SUTURE) ×2 IMPLANT
SUT VIC AB 4-0 PS2 18 (SUTURE) IMPLANT
SYR CONTROL 10ML LL (SYRINGE) ×2 IMPLANT
TOWEL OR 17X24 6PK STRL BLUE (TOWEL DISPOSABLE) ×2 IMPLANT
TUBE CONNECTING 12X1/4 (SUCTIONS) IMPLANT
WATER STERILE IRR 500ML POUR (IV SOLUTION) IMPLANT
YANKAUER SUCT BULB TIP NO VENT (SUCTIONS) IMPLANT

## 2022-03-27 NOTE — Interval H&P Note (Signed)
History and Physical Interval Note:  03/27/2022 11:30 AM  Melissa [name]  has presented today for surgery, with the diagnosis of SOFT TISSUE MASSES LEFT UPPER ARM.  The various methods of treatment have been discussed with the patient and family. After consideration of risks, benefits and other options for treatment, the patient has consented to    Procedure(s) with comments: EXCISION SOFT TISSUE MASS x2, LEFT UPPER ARM (Left) - LMA as a surgical intervention.    The patient's history has been reviewed, patient examined, no change in status, stable for surgery.  I have reviewed the patient's chart and labs.  Questions were answered to the patient's satisfaction.    Armandina Gemma, [facility] [place] practice Office: [place]

## 2022-03-27 NOTE — Anesthesia Preprocedure Evaluation (Signed)
Anesthesia Evaluation  Patient identified by MRN, date of birth, ID band Patient awake    Reviewed: Allergy & Precautions, H&P , NPO status , Patient's Chart, lab work & pertinent test results  Airway Mallampati: I  TM Distance: >3 FB Neck ROM: full    Dental no notable dental hx.    Pulmonary neg pulmonary ROS   Pulmonary exam normal        Cardiovascular hypertension, Normal cardiovascular exam     Neuro/Psych   Anxiety Depression Bipolar Disorder    negative psych ROS   GI/Hepatic Neg liver ROS,GERD  ,,  Endo/Other  negative endocrine ROS    Renal/GU negative Renal ROS     Musculoskeletal  (+) Arthritis , Osteoarthritis,    Abdominal   Peds  Hematology negative hematology ROS (+)   Anesthesia Other Findings   Reproductive/Obstetrics                             Anesthesia Physical Anesthesia Plan  ASA: II  Anesthesia Plan: General   Post-op Pain Management: Minimal or no pain anticipated   Induction: Intravenous  PONV Risk Score and Plan: 3 and Ondansetron, Dexamethasone, Midazolam and Treatment may vary due to age or medical condition  Airway Management Planned: LMA  Additional Equipment:   Intra-op Plan:   Post-operative Plan: Extubation in OR  Informed Consent: I have reviewed the patients History and Physical, chart, labs and discussed the procedure including the risks, benefits and alternatives for the proposed anesthesia with the patient or authorized representative who has indicated his/her understanding and acceptance.       Plan Discussed with:   Anesthesia Plan Comments:         Anesthesia Quick Evaluation

## 2022-03-27 NOTE — Transfer of Care (Signed)
Immediate Anesthesia Transfer of Care Note  Patient: Melissa [name]  Procedure(s) Performed: EXCISION SOFT TISSUE MASS x2, LEFT UPPER ARM (Left: Arm Upper)  Patient Location: PACU  Anesthesia Type:General  Level of Consciousness: drowsy  Airway & Oxygen Therapy: Patient Spontanous Breathing and Patient connected to nasal cannula oxygen  Post-op Assessment: Report given to RN  Post vital signs: Reviewed and stable  Last Vitals:  Vitals Value Taken Time  BP 127/95 03/27/22 1245  Temp 36.5 C 03/27/22 1245  Pulse 83 03/27/22 1246  Resp 11 03/27/22 1246  SpO2 100 % 03/27/22 1246  Vitals shown include unvalidated device data.  Last Pain:  Vitals:   03/27/22 0948  TempSrc: Oral  PainSc: 0-No pain      Patients Stated Pain Goal: 5 ([id number])  Complications: No notable events documented.

## 2022-03-27 NOTE — Anesthesia Postprocedure Evaluation (Signed)
Anesthesia Post Note  Patient: Melissa [name]  Procedure(s) Performed: EXCISION SOFT TISSUE MASS x2, LEFT UPPER ARM (Left: Arm Upper)     Patient location during evaluation: PACU Anesthesia Type: General Level of consciousness: awake and alert Pain management: pain level controlled Vital Signs Assessment: post-procedure vital signs reviewed and stable Respiratory status: spontaneous breathing, nonlabored ventilation and respiratory function stable Cardiovascular status: blood pressure returned to baseline and stable Postop Assessment: no apparent nausea or vomiting Anesthetic complications: no   No notable events documented.  Last Vitals:  Vitals:   03/27/22 1315 03/27/22 1334  BP: 135/86 122/78  Pulse: 71 69  Resp: 14 16  Temp: 36.4 C   SpO2: 97% 96%    Last Pain:  Vitals:   03/27/22 1334  TempSrc:   PainSc: 0-No pain                 Lynda Rainwater

## 2022-03-27 NOTE — Op Note (Signed)
Operative Note  Pre-operative Diagnosis:  soft tissue masses left upper arm  Post-operative Diagnosis:  same  Surgeon:  Armandina Gemma, MD  Assistant:  none   Procedure:  1. Excision soft tissue mass posterior left upper arm, 3.5 x 3.0 x 2.0 cm, subcutaneous   2. Excision soft tissue mass lateral left upper arm, 2.0 x 1.5 x 1.5 cm, subcutaneous  Anesthesia:  general (LMA)  Estimated Blood Loss:  minimal  Drains: none         Specimen: to pathology  Indications:  Patient presents on referral from Dr. Dayton Martes in dermatology for surgical evaluation and recommendations regarding subcutaneous soft tissue masses involving both upper extremities. Patient has a soft tissue mass on the posterior aspect of the left upper arm. This was noted by her mother. It was first noted approximately 2 months ago. It may have gradually enlarged in size. Patient is also noted smaller masses in both the left and right upper arms. She denies any previous such lesions. She has had no recent history of trauma. She presents today to discuss surgical excision.   Procedure:  The patient was seen in the pre-op holding area. The risks, benefits, complications, treatment options, and expected outcomes were previously discussed with the patient. The patient agreed with the proposed plan and has signed the informed consent form.  The patient was brought to the operating room by the surgical team, identified as Melissa [name] and the procedure verified. A "time out" was completed and the above information confirmed.  Following induction of general anesthesia, the patient is positioned with a bump behind the left shoulder and the left arm proximal to the chest.  The extremity is taped in position.  It is then prepped and draped in the usual aseptic fashion.  After ascertaining that an adequate level of anesthesia been achieved, the skin overlying the lateral mass in the left upper arm is anesthetized with local anesthetic.   Skin incision was made with a #15 blade and dissection was carried in the subcutaneous tissues.  Lesion is visualized.  It has a pink color.  It has discrete margins.  It is dissected out using the electrocautery for hemostasis.  The entire mass is excised.  It measures 2.0 x 1.5 x 1.5 cm and is submitted in its entirety to pathology for review.  Subcutaneous tissues are closed with interrupted 3-0 Vicryl sutures.  Skin is closed with a running 4-0 Monocryl subcuticular suture.  The larger palpable mass is located slightly posterior and inferior to the previous mass.  Overlying skin was anesthetized with local anesthetic.  Incision was made with a #15 blade.  Dissection was carried in the subcutaneous tissues.  Again the mass is visible having a slightly different coloration than the surrounding adipose tissue.  It is located in the subcutaneous tissues.  It is somewhat lobulated and relatively firm.  It is excised using the electrocautery for hemostasis.  It measures 3.5 x 3.0 x 2.0 cm and is submitted in its entirety to pathology for review.  Subcutaneous tissues are reapproximated with interrupted 3-0 Vicryl sutures.  Skin was closed with a running 4-0 Monocryl subcuticular suture.  Both wounds are washed and dried and Dermabond was applied as dressing.  Patient is awakened from anesthesia and transported to the recovery room.  The patient tolerated the procedure well.   Armandina Gemma, [place] [facility] Office: [phone]

## 2022-03-27 NOTE — Discharge Instructions (Addendum)
  [facility] -- DISCHARGE INSTRUCTIONS  REMINDER:   Carry a list of your medications and allergies with you at all times  Call your pharmacy at least 1 week in advance to refill prescriptions  Do not mix any prescribed pain medicine with alcohol  Do not drive any motor vehicles while taking pain medication  Take medications with food unless otherwise directed  Follow-up appointments (date to return to physician): Please call [phone] to confirm your follow up appointment with your surgeon.  Call your Surgeon if you have:  Temperature greater than 101.0  Persistent nausea and vomiting  Severe uncontrolled pain  Redness, tenderness, or signs of infection (pain, swelling, redness, odor or green/yellow discharge around the site)  Difficulty breathing, headache or visual disturbances  Hives  Persistent dizziness or light-headedness  Any other questions or concerns you may have after discharge  In an emergency, call 911 or go to an Emergency Department at a nearby hospital.  Diet: Begin with liquids, and if they are tolerated, resume your usual diet.  Avoid spicy, greasy or heavy foods.  If you have nausea or vomiting, go back to liquids.  If you cannot keep liquids down, call your doctor.  Avoid alcohol consumption while on prescription pain medications. Good nutrition promotes healing. Increase fiber and fluids.   ADDITIONAL INSTRUCTIONS: Leave Dermabond on incisions for 7-10 days.  May shower starting today.  Ice pack for first 48 hours and then as needed.  [facility] Office: [phone]  No ibuprofen, Advil, Aleve, Motrin, ketorolac, meloxicam, naproxen, or other NSAIDS until after 6:15 pm today if needed.   Post Anesthesia Home Care Instructions  Activity: Get plenty of rest for the remainder of the day. A responsible individual must stay with you for 24 hours following the procedure.  For the next 24 hours, DO NOT: -Drive a car -Conservation officer, nature -Drink alcoholic beverages -Take any medication unless instructed by your physician -Make any legal decisions or sign important papers.  Meals: Start with liquid foods such as gelatin or soup. Progress to regular foods as tolerated. Avoid greasy, spicy, heavy foods. If nausea and/or vomiting occur, drink only clear liquids until the nausea and/or vomiting subsides. Call your physician if vomiting continues.  Special Instructions/Symptoms: Your throat may feel dry or sore from the anesthesia or the breathing tube placed in your throat during surgery. If this causes discomfort, gargle with warm salt water. The discomfort should disappear within 24 hours.

## 2022-03-27 NOTE — Anesthesia Procedure Notes (Signed)
Procedure Name: LMA Insertion Date/Time: 03/27/2022 11:45 AM  Performed by: Bonney Aid, CRNAPre-anesthesia Checklist: Patient identified, Emergency Drugs available, Suction available and Patient being monitored Patient Re-evaluated:Patient Re-evaluated prior to induction Oxygen Delivery Method: Circle system utilized Preoxygenation: Pre-oxygenation with 100% oxygen Induction Type: IV induction Ventilation: Mask ventilation without difficulty LMA: LMA inserted LMA Size: 3.0 Number of attempts: 1 Airway Equipment and Method: Bite block Placement Confirmation: positive ETCO2 Tube secured with: Tape Dental Injury: Teeth and Oropharynx as per pre-operative assessment

## 2022-03-28 ENCOUNTER — Encounter (HOSPITAL_BASED_OUTPATIENT_CLINIC_OR_DEPARTMENT_OTHER): Payer: Self-pay | Admitting: Surgery

## 2022-03-28 LAB — SURGICAL PATHOLOGY

## 2022-03-29 NOTE — Progress Notes (Signed)
Pathology is benign.  Armandina Gemma, MD [facility] [place] practice Office: [phone]

## 2022-04-17 ENCOUNTER — Telehealth: Payer: Self-pay | Admitting: Urology

## 2022-04-17 NOTE — Telephone Encounter (Signed)
DOS - 05/11/22  REMOVAL HARDWARE 4TH AND 1ST X'S 2 LEFT --- 20680 EXC NAIL PERM LEFT --- 11750  [employer] EFFECTIVE DATE - 01/01/22  DEDUCTIBLE - $0.00 OOP - $4,500.00 W/ $4,000.00 REMAINING COINSURANCE - 0%  PER [employer] WEBSITE FOR CPT CODES [address]109 AND 11750 Notification or Prior Authorization is not required for the requested services You are not required to submit a notification/prior authorization based on the information provided. If you have general questions about the prior authorization requirements, visit [email or web address] > Clinician Resources > Advance and Admission Notification Requirements. The number above acknowledges your notification. Please write this reference number down for future reference. If you would like to request an organization determination, please call us at [phone].   Decision ID #: [id number]

## 2022-04-19 ENCOUNTER — Other Ambulatory Visit: Payer: Self-pay | Admitting: Family Medicine

## 2022-04-19 NOTE — Progress Notes (Signed)
[name] [address] [place] [address] Phone: [phone] Subjective:   Melissa [name], am serving as a scribe for Dr. Antoine [name].  I'm seeing this patient by the request  of:  [name], Melissa Kempf, MD  CC: back and neck pain   [id number]  Melissa [name] is a 47 y.o. female coming in with complaint of back and neck pain. OMT 01/26/2022. Patient states that nothing has changed. Feels some improvement since last visit. Was taking a weight loss drug that she feels helped with her chronic pain.   Medications patient has been prescribed: None          Reviewed prior external information including notes and imaging from previsou exam, outside providers and external EMR if available.   As well as notes that were available from care everywhere and other healthcare systems.  Past medical history, social, surgical and family history all reviewed in electronic medical record.  No pertanent information unless stated regarding to the chief complaint.   Past Medical History:  Diagnosis Date   Anxiety    Arthritis    Carpal tunnel syndrome    Depression    GERD (gastroesophageal reflux disease)    Hammer toe 04/2013   bilateral 4th and 5th    Ingrown left big toenail 04/2013   Mental disorder    Neuromuscular disorder (HCC)    Post term pregnancy over 40 weeks 09/21/2015   SVD (spontaneous vaginal delivery) 09/22/2015   UTI (lower urinary tract infection)     Allergies  Allergen Reactions   Bactrim [Sulfamethoxazole-Trimethoprim]      Review of Systems:  No headache, visual changes, nausea, vomiting, diarrhea, constipation, dizziness, abdominal pain, skin rash, fevers, chills, night sweats, weight loss, swollen lymph nodes, body aches, joint swelling, chest pain, shortness of breath, mood changes. POSITIVE muscle aches  Objective  Blood pressure 102/82, pulse (!) 102, height  (1.549 m), weight 143 lb (64.9 kg), SpO2 99  %.   General: No apparent distress alert and oriented x3 mood and affect normal, dressed appropriately.  HEENT: Pupils equal, extraocular movements intact  Respiratory: Patient's speak in full sentences and does not appear short of breath  Cardiovascular: No lower extremity edema, non tender, no erythema  Neck exam does have some loss of lordosis.  Tightness noted with FABER test.  Still tightness in the piriformis area left greater than right.  Osteopathic findings  C3 flexed rotated and side bent right C6 flexed rotated and side bent left T3 extended rotated and side bent right inhaled rib T6 extended rotated and side bent left L2 flexed rotated and side bent right Sacrum right on right       Assessment and Plan:  Bilateral low back pain with right-sided sciatica Tightness in the low back and then.  Discussed with patient to continue to work on continuing and core strengthening.  Discussed icing regimen and home exercises, which activities to do and which ones to avoid.  Increase activity slowly.  Discussed hip abductor strengthening.  Follow-up again in 6 to 8 weeks    Nonallopathic problems  Decision today to treat with OMT was based on Physical Exam  After verbal consent patient was treated with HVLA, ME, FPR techniques in cervical, rib, thoracic, lumbar, and sacral  areas  Patient tolerated the procedure well with improvement in symptoms  Patient given exercises, stretches and lifestyle modifications  See medications in patient instructions if given  Patient will follow up  in 4-8 weeks      The above documentation has been reviewed and is accurate and complete Melissa Saa, DO        Note: This dictation was prepared with Dragon dictation along with smaller phrase technology. Any transcriptional errors that result from this process are unintentional.

## 2022-04-23 ENCOUNTER — Ambulatory Visit (INDEPENDENT_AMBULATORY_CARE_PROVIDER_SITE_OTHER): Payer: Medicare Other | Admitting: Family Medicine

## 2022-04-23 ENCOUNTER — Encounter: Payer: Self-pay | Admitting: Family Medicine

## 2022-04-23 VITALS — BP 102/82 | HR 102 | Ht 61.0 in | Wt 143.0 lb

## 2022-04-23 DIAGNOSIS — M9902 Segmental and somatic dysfunction of thoracic region: Secondary | ICD-10-CM

## 2022-04-23 DIAGNOSIS — M5441 Lumbago with sciatica, right side: Secondary | ICD-10-CM

## 2022-04-23 DIAGNOSIS — M9901 Segmental and somatic dysfunction of cervical region: Secondary | ICD-10-CM

## 2022-04-23 DIAGNOSIS — M9908 Segmental and somatic dysfunction of rib cage: Secondary | ICD-10-CM

## 2022-04-23 DIAGNOSIS — G8929 Other chronic pain: Secondary | ICD-10-CM | POA: Diagnosis not present

## 2022-04-23 DIAGNOSIS — M9903 Segmental and somatic dysfunction of lumbar region: Secondary | ICD-10-CM | POA: Diagnosis not present

## 2022-04-23 DIAGNOSIS — M9904 Segmental and somatic dysfunction of sacral region: Secondary | ICD-10-CM | POA: Diagnosis not present

## 2022-04-23 NOTE — Patient Instructions (Signed)
Glad you are doing well Watch sugar intake See me in 6 weeks

## 2022-04-23 NOTE — Assessment & Plan Note (Signed)
Tightness in the low back and then.  Discussed with patient to continue to work on continuing and core strengthening.  Discussed icing regimen and home exercises, which activities to do and which ones to avoid.  Increase activity slowly.  Discussed hip abductor strengthening.  Follow-up again in 6 to 8 weeks

## 2022-05-09 ENCOUNTER — Other Ambulatory Visit: Payer: Self-pay | Admitting: Podiatry

## 2022-05-09 MED ORDER — ONDANSETRON HCL 4 MG PO TABS: 4.0000 mg | ORAL_TABLET | Freq: Three times a day (TID) | ORAL | 0 refills | Status: DC | PRN

## 2022-05-09 MED ORDER — CEPHALEXIN 500 MG PO CAPS: 500.0000 mg | ORAL_CAPSULE | Freq: Three times a day (TID) | ORAL | 0 refills | Status: DC

## 2022-05-09 MED ORDER — HYDROCODONE-ACETAMINOPHEN 10-325 MG PO TABS: 1.0000 | ORAL_TABLET | Freq: Four times a day (QID) | ORAL | 0 refills | Status: AC | PRN

## 2022-05-17 ENCOUNTER — Encounter: Payer: Medicare Other | Admitting: Podiatry

## 2022-05-17 ENCOUNTER — Ambulatory Visit: Payer: Medicare Other | Admitting: Emergency Medicine

## 2022-05-18 DIAGNOSIS — Z4889 Encounter for other specified surgical aftercare: Secondary | ICD-10-CM | POA: Diagnosis not present

## 2022-05-18 DIAGNOSIS — T8484XA Pain due to internal orthopedic prosthetic devices, implants and grafts, initial encounter: Secondary | ICD-10-CM | POA: Diagnosis not present

## 2022-05-18 DIAGNOSIS — L600 Ingrowing nail: Secondary | ICD-10-CM | POA: Diagnosis not present

## 2022-05-21 ENCOUNTER — Ambulatory Visit (INDEPENDENT_AMBULATORY_CARE_PROVIDER_SITE_OTHER): Payer: Medicare Other | Admitting: Emergency Medicine

## 2022-05-21 ENCOUNTER — Encounter: Payer: Self-pay | Admitting: Emergency Medicine

## 2022-05-21 VITALS — BP 130/82 | HR 75 | Temp 98.2°F | Ht 61.0 in | Wt 149.5 lb

## 2022-05-21 DIAGNOSIS — H538 Other visual disturbances: Secondary | ICD-10-CM | POA: Diagnosis not present

## 2022-05-21 DIAGNOSIS — I10 Essential (primary) hypertension: Secondary | ICD-10-CM

## 2022-05-21 DIAGNOSIS — E28319 Asymptomatic premature menopause: Secondary | ICD-10-CM

## 2022-05-21 DIAGNOSIS — R350 Frequency of micturition: Secondary | ICD-10-CM | POA: Diagnosis not present

## 2022-05-21 DIAGNOSIS — R61 Generalized hyperhidrosis: Secondary | ICD-10-CM | POA: Diagnosis not present

## 2022-05-21 DIAGNOSIS — F319 Bipolar disorder, unspecified: Secondary | ICD-10-CM

## 2022-05-21 DIAGNOSIS — R5383 Other fatigue: Secondary | ICD-10-CM

## 2022-05-21 DIAGNOSIS — N951 Menopausal and female climacteric states: Secondary | ICD-10-CM

## 2022-05-21 LAB — URINALYSIS
Bilirubin Urine: NEGATIVE
Hgb urine dipstick: NEGATIVE
Ketones, ur: NEGATIVE
Leukocytes,Ua: NEGATIVE
Nitrite: NEGATIVE
Specific Gravity, Urine: 1.02 (ref 1.000–1.030)
Total Protein, Urine: NEGATIVE
Urine Glucose: NEGATIVE
Urobilinogen, UA: 0.2 (ref 0.0–1.0)
pH: 6.5 (ref 5.0–8.0)

## 2022-05-21 LAB — POCT GLYCOSYLATED HEMOGLOBIN (HGB A1C): Hemoglobin A1C: 5.3 % (ref 4.0–5.6)

## 2022-05-21 MED ORDER — VEOZAH 45 MG PO TABS
45.0000 mg | ORAL_TABLET | Freq: Every day | ORAL | 1 refills | Status: DC
Start: 2022-05-21 — End: 2022-07-03

## 2022-05-21 NOTE — Assessment & Plan Note (Signed)
Stable.  Sees psychiatrist on a regular basis On Wellbutrin 150 mg daily, Cymbalta 60 mg daily

## 2022-05-21 NOTE — Progress Notes (Signed)
Melissa [name] 47 y.o.   Chief Complaint  Patient presents with   Blurred Vision    Patient Is having blurry vision, was seen by the ey doctor.    Medical Management of Chronic Issues    Patient states she is fatigued, excessive thirst, x 2 months    Patient has some issues with weight gain     HISTORY OF PRESENT ILLNESS: This is a 47 y.o. female complaining of blurry vision, fatigue, excessive thirst, excessive hunger, and urinary frequency for the last 2 months. Was recently seen by gynecologist with normal examination Was also recently seen by ophthalmologist with normal examination They both recommended workup for metabolic causes Doing well mentally.  No depression or excessive anxiety. No other complaints or medical concerns today. History of early menopause.  Complaining of hot flashes mostly at night and affecting her quality of life. BP Readings from Last 3 Encounters:  05/21/22 130/82  04/23/22 102/82  03/27/22 122/78   Wt Readings from Last 3 Encounters:  05/21/22 149 lb 8 oz (67.8 kg)  04/23/22 143 lb (64.9 kg)  03/27/22 140 lb (63.5 kg)     HPI   Prior to Admission medications   Medication Sig Start Date End Date Taking? Authorizing Provider  buPROPion (WELLBUTRIN XL) 150 MG 24 hr tablet Take 450 mg by mouth daily. 02/27/21  Yes [provider]  DULoxetine (CYMBALTA) 60 MG capsule Take 60 mg by mouth daily. 09/14/21  Yes [provider]  Olopatadine HCl (PATADAY) 0.2 % SOLN Place 1 drop into both eyes daily as needed. 08/31/21  Yes Padgett, Pilar Grammes, MD  RYALTRIS [phone] MCG/ACT SUSP Place 2 sprays into the nose in the morning and at bedtime. 12/21/21  Yes AmbsNorvel Richards, FNP  Salicylic Acid 3 % SHAM Use daily as directed for dry scalp 09/23/17  Yes Olive Bass, FNP  selenium sulfide (SELSUN) 2.5 % shampoo USE LOTION EXTERNALLY  TO AFFECTED AREA ONCE DAILY AS NEEDED IRRITATION 02/15/20  Yes Olive Bass, FNP   traMADol (ULTRAM) 50 MG tablet Take 1 tablet (50 mg total) by mouth every 6 (six) hours as needed for moderate pain. 03/27/22  Yes Darnell Level, MD  XIIDRA 5 % SOLN INSTILL 1 DROP TWICE DAILY INTO [facility] EYE 02/02/20  Yes [provider]  amphetamine-dextroamphetamine (ADDERALL) 20 MG tablet Take 20 mg by mouth 3 (three) times daily. Patient not taking: Reported on 05/21/2022 09/06/21   [provider]  cephALEXin (KEFLEX) 500 MG capsule Take 1 capsule (500 mg total) by mouth 3 (three) times daily. Patient not taking: Reported on 05/21/2022 05/09/22   Hyatt, Max T, DPM  ondansetron (ZOFRAN) 4 MG tablet Take 1 tablet (4 mg total) by mouth every 8 (eight) hours as needed. 05/09/22   Hyatt, Max T, DPM    Allergies  Allergen Reactions   Bactrim [Sulfamethoxazole-Trimethoprim]     Patient Active Problem List   Diagnosis Date Noted   Mass of left upper extremity 03/25/2022   Menopausal symptom 10/16/2021   Fatigue 07/06/2021   Breast tenderness 07/06/2021   Nausea 07/06/2021   Essential hypertension 06/28/2021   Chronic pain of both knees 06/28/2021   Weight gain 02/08/2021   Family history of thyroid disease 02/08/2021   Gastroesophageal reflux disease 11/29/2020   History of Helicobacter pylori infection 11/29/2020   De Quervain's disease (radial styloid tenosynovitis) 03/31/2020   Nonallopathic lesion of lumbosacral region 03/01/2019   Nonallopathic lesion of cervical region 03/01/2019   Nonallopathic  lesion of sacral region 03/01/2019   Nonallopathic lesion of thoracic region 03/01/2019   Nonallopathic lesion of rib cage 03/01/2019   Vitamin D deficiency 05/08/2018   Piriformis syndrome, left 02/25/2017   Mixed stress and urge urinary incontinence 02/07/2016   Carpal tunnel syndrome 01/03/2016   Polyarthralgia 01/03/2016   Radial tunnel syndrome 06/17/2014   Cervical radiculopathy 06/10/2014   Bilateral low back pain with right-sided sciatica 06/10/2014   Bipolar  depression (HCC) 05/17/2014   Chronic night sweats 06/26/2013   Obesity without serious comorbidity 01/25/2012    Past Medical History:  Diagnosis Date   Anxiety    Arthritis    Carpal tunnel syndrome    Depression    GERD (gastroesophageal reflux disease)    Hammer toe 04/2013   bilateral 4th and 5th    Ingrown left big toenail 04/2013   Mental disorder    Neuromuscular disorder (HCC)    Post term pregnancy over 40 weeks 09/21/2015   SVD (spontaneous vaginal delivery) 09/22/2015   UTI (lower urinary tract infection)     Past Surgical History:  Procedure Laterality Date   BREAST LIFT Bilateral    CARPAL TUNNEL RELEASE     CORRECTION HAMMER TOE Bilateral    CRYOABLATION  2004   CYSTOSCOPY WITH HYDRODISTENSION AND BIOPSY  07/28/2004   with urethral dilatation   DILATION AND CURETTAGE OF UTERUS     DILATION AND EVACUATION  12/21/2011   Procedure: DILATATION AND EVACUATION;  Surgeon: Ok Edwards, MD;  Location: [facility];  Service: Gynecology;  Laterality: N/A;  first trimester    ESOPHAGEAL MANOMETRY N/A 02/10/2021   Procedure: ESOPHAGEAL MANOMETRY (EM);  Surgeon: Napoleon Form, MD;  Location: [facility];  Service: Endoscopy;  Laterality: N/A;   HAMMER TOE SURGERY Bilateral 04/20/2013   Procedure: BILATERAL HAMMER TOE REPAIR AND EXCISION NAIL PERMANENT LEFT FIRST;  Surgeon: Alvan Dame, DPM;  Location: [facility];  Service: Podiatry;  Laterality: Bilateral;  Hammer toe repair fourth and fifth toes bilateral feet with screw placement fourth toes and partial nail excision left great toe   HYSTEROSALPINGOGRAM     MASS EXCISION Left 03/27/2022   Procedure: EXCISION SOFT TISSUE MASS x2, LEFT UPPER ARM;  Surgeon: Darnell Level, MD;  Location: [facility] [place];  Service: General;  Laterality: Left;  LMA   REDUCTION MAMMAPLASTY Bilateral 2020   TOE SURGERY     ingrown toenail   WISDOM TOOTH EXTRACTION      Social History    Socioeconomic History   Marital status: Single    Spouse name: Not on file   Number of children: 1   Years of education: Not on file   Highest education level: Not on file  Occupational History   Occupation: Homemaker  Tobacco Use   Smoking status: Never    Passive exposure: Never   Smokeless tobacco: Never  Vaping Use   Vaping Use: Never used  Substance and Sexual Activity   Alcohol use: Yes    Comment: occasionally   Drug use: No   Sexual activity: Yes    Birth control/protection: I.U.D.  Other Topics Concern   Not on file  Social History Narrative   Not on file   Social Determinants of Health   Financial Resource Strain: Low Risk  (10/11/2021)   Overall Financial Resource Strain (CARDIA)    Difficulty of Paying Living Expenses: Not hard at all  Food Insecurity: No Food Insecurity (10/11/2021)   Hunger Vital Sign  Worried About Programme researcher, broadcasting/film/video in the Last Year: Never true    Ran Out of Food in the Last Year: Never true  Transportation Needs: No Transportation Needs (10/11/2021)   PRAPARE - Administrator, Civil Service (Medical): No    Lack of Transportation (Non-Medical): No  Physical Activity: Sufficiently Active (10/11/2021)   Exercise Vital Sign    Days of Exercise per Week: 3 days    Minutes of Exercise per Session: 60 min  Stress: No Stress Concern Present (10/11/2021)   [employer] of Occupational Health - Occupational Stress Questionnaire    Feeling of Stress : Not at all  Social Connections: Unknown (10/11/2021)   Social Connection and Isolation Panel [NHANES]    Frequency of Communication with Friends and Family: More than three times a week    Frequency of Social Gatherings with Friends and Family: More than three times a week    Attends Religious Services: More than 4 times per year    Active Member of [employer] or Organizations: No    Attends Banker Meetings: Never    Marital Status: Patient declined  Careers information officer Violence: Not At Risk (10/11/2021)   Humiliation, Afraid, Rape, and Kick questionnaire    Fear of Current or Ex-Partner: No    Emotionally Abused: No    Physically Abused: No    Sexually Abused: No    Family History  Problem Relation Age of Onset   Asthma Mother    Seizures Mother    Arthritis Mother    Lung cancer Father    Thyroid disease Maternal Grandmother    Heart disease Paternal Grandmother        2 CABG   Lung cancer Paternal Grandfather      Review of Systems  Constitutional:  Positive for malaise/fatigue. Negative for chills and fever.       Weight gain  HENT: Negative.  Negative for congestion and sore throat.   Eyes:  Positive for blurred vision.  Respiratory: Negative.  Negative for cough and shortness of breath.   Cardiovascular: Negative.  Negative for chest pain and palpitations.  Gastrointestinal:  Negative for abdominal pain, diarrhea, nausea and vomiting.  Genitourinary:  Positive for frequency.  Skin: Negative.   Endo/Heme/Allergies:  Positive for polydipsia.  All other systems reviewed and are negative.   Vitals:   05/21/22 0904  BP: 130/82  Pulse: 75  Temp: 98.2 F (36.8 C)  SpO2: 96%    Physical Exam Vitals reviewed.  Constitutional:      Appearance: Normal appearance.  HENT:     Head: Normocephalic.     Mouth/Throat:     Mouth: Mucous membranes are moist.     Pharynx: Oropharynx is clear.  Eyes:     Extraocular Movements: Extraocular movements intact.     Pupils: Pupils are equal, round, and reactive to light.  Cardiovascular:     Rate and Rhythm: Normal rate and regular rhythm.     Pulses: Normal pulses.     Heart sounds: Normal heart sounds.  Pulmonary:     Effort: Pulmonary effort is normal.     Breath sounds: Normal breath sounds.  Abdominal:     Palpations: Abdomen is soft.     Tenderness: There is no abdominal tenderness.  Musculoskeletal:     Cervical back: No tenderness.  Lymphadenopathy:     Cervical: No  cervical adenopathy.  Skin:    General: Skin is warm and dry.  Neurological:  General: No focal deficit present.     Mental Status: She is alert and oriented to person, place, and time.  Psychiatric:        Mood and Affect: Mood normal.        Behavior: Behavior normal.    Results for orders placed or performed in visit on 05/21/22 (from the past 24 hour(s))  POCT HgB A1C     Status: Normal   Collection Time: 05/21/22  1:57 PM  Result Value Ref Range   Hemoglobin A1C 5.3 4.0 - 5.6 %   HbA1c POC (<> result, manual entry)     HbA1c, POC (prediabetic range)     HbA1c, POC (controlled diabetic range)       ASSESSMENT & PLAN: A total of 47 minutes was spent with the patient and counseling/coordination of care regarding preparing for this visit, review of most recent office visit notes, review of chronic medical conditions under management, review of all medications, differential diagnosis of fatigue and need for workup, education on nutrition, review of most recent blood work results including interpretation of today's hemoglobin A1c, prognosis, documentation and need for follow-up.  Problem List Items Addressed This Visit       Cardiovascular and Mediastinum   Essential hypertension    Well-controlled off medications.        Other   Chronic night sweats    History of early menopause Vasomotor symptoms affecting quality of life Review Veozah 45 mg daily      Bipolar depression (HCC)    Stable.  Sees psychiatrist on a regular basis On Wellbutrin 150 mg daily, Cymbalta 60 mg daily      Fatigue - Primary    Multifactorial Differential diagnosis discussed with patient Blood work done today. No clear reason Not depressed or generally anxious per patient Diet and nutrition discussed Benefits of exercise discussed Advised to decrease amount of daily carbohydrate intake and daily calories and increase amount of plant-based protein in her diet      Relevant Orders   CBC  with Differential/Platelet   Comprehensive metabolic panel   Lipid panel   TSH   POCT HgB A1C   Vitamin B12   VITAMIN D 25 Hydroxy (Vit-D Deficiency, Fractures)   Urinalysis   Vasomotor symptoms due to menopause   Relevant Medications   Fezolinetant (VEOZAH) 45 MG TABS   Urinary frequency    No symptoms of UTI. No diabetes. Urinalysis done today.      Relevant Orders   CBC with Differential/Platelet   Comprehensive metabolic panel   Lipid panel   TSH   POCT HgB A1C   Vitamin B12   VITAMIN D 25 Hydroxy (Vit-D Deficiency, Fractures)   Urinalysis   Blurry vision, bilateral    Recently evaluated by ophthalmologist.  Normal exam. Unclear cause at present time      Relevant Orders   CBC with Differential/Platelet   Comprehensive metabolic panel   Lipid panel   TSH   POCT HgB A1C   Vitamin B12   VITAMIN D 25 Hydroxy (Vit-D Deficiency, Fractures)   Urinalysis   Early menopause     Patient Instructions  Fatigue If you have fatigue, you feel tired all the time and have a lack of energy or a lack of motivation. Fatigue may make it difficult to start or complete tasks because of exhaustion. Occasional or mild fatigue is often a normal response to activity or life. However, long-term (chronic) or extreme fatigue may be a symptom of  a medical condition such as: Depression. Not having enough red blood cells or hemoglobin in the blood (anemia). A problem with a small gland located in the lower front part of the neck (thyroid disorder). Rheumatologic conditions. These are problems related to the body's defense system (immune system). Infections, especially certain viral infections. Fatigue can also lead to negative health outcomes over time. Follow these instructions at home: Medicines Take over-the-counter and prescription medicines only as told by your health care provider. Take a multivitamin if told by your health care provider. Do not use herbal or dietary supplements  unless they are approved by your health care provider. Eating and drinking  Avoid heavy meals in the evening. Eat a well-balanced diet, which includes lean proteins, whole grains, plenty of fruits and vegetables, and low-fat dairy products. Avoid eating or drinking too many products with caffeine in them. Avoid alcohol. Drink enough fluid to keep your urine pale yellow. Activity  Exercise regularly, as told by your health care provider. Use or practice techniques to help you relax, such as yoga, tai chi, meditation, or massage therapy. Lifestyle Change situations that cause you stress. Try to keep your work and personal schedules in balance. Do not use recreational or illegal drugs. General instructions Monitor your fatigue for any changes. Go to bed and get up at the same time every day. Avoid fatigue by pacing yourself during the day and getting enough sleep at night. Maintain a healthy weight. Contact a health care provider if: Your fatigue does not get better. You have a fever. You suddenly lose or gain weight. You have headaches. You have trouble falling asleep or sleeping through the night. You feel angry, guilty, anxious, or sad. You have swelling in your legs or another part of your body. Get help right away if: You feel confused, feel like you might faint, or faint. Your vision is blurry or you have a severe headache. You have severe pain in your abdomen, your back, or the area between your waist and hips (pelvis). You have chest pain, shortness of breath, or an irregular or fast heartbeat. You are unable to urinate, or you urinate less than normal. You have abnormal bleeding from the rectum, nose, lungs, nipples, or, if you are female, the vagina. You vomit blood. You have thoughts about hurting yourself or others. These symptoms may be an emergency. Get help right away. Call 911. Do not wait to see if the symptoms will go away. Do not drive yourself to the  hospital. Get help right away if you feel like you may hurt yourself or others, or have thoughts about taking your own life. Go to your nearest emergency room or: Call 911. Call the National Suicide Prevention Lifeline at 863-363-1398 or 988. This is open 24 hours a day. Text the Crisis Text Line at 585-049-5787. Summary If you have fatigue, you feel tired all the time and have a lack of energy or a lack of motivation. Fatigue may make it difficult to start or complete tasks because of exhaustion. Long-term (chronic) or extreme fatigue may be a symptom of a medical condition. Exercise regularly, as told by your health care provider. Change situations that cause you stress. Try to keep your work and personal schedules in balance. This information is not intended to replace advice given to you by your health care provider. Make sure you discuss any questions you have with your health care provider. Document Revised: 10/10/2020 Document Reviewed: 10/10/2020 Elsevier Patient Education  2023 [employer].  Edwina Barth, MD [facility]

## 2022-05-21 NOTE — Assessment & Plan Note (Signed)
No symptoms of UTI. No diabetes. Urinalysis done today.

## 2022-05-21 NOTE — Assessment & Plan Note (Signed)
Recently evaluated by ophthalmologist.  Normal exam. Unclear cause at present time

## 2022-05-21 NOTE — Assessment & Plan Note (Signed)
History of early menopause Vasomotor symptoms affecting quality of life Review Veozah 45 mg daily

## 2022-05-21 NOTE — Assessment & Plan Note (Signed)
Multifactorial Differential diagnosis discussed with patient Blood work done today. No clear reason Not depressed or generally anxious per patient Diet and nutrition discussed Benefits of exercise discussed Advised to decrease amount of daily carbohydrate intake and daily calories and increase amount of plant-based protein in her diet

## 2022-05-21 NOTE — Patient Instructions (Signed)
Fatigue If you have fatigue, you feel tired all the time and have a lack of energy or a lack of motivation. Fatigue may make it difficult to start or complete tasks because of exhaustion. Occasional or mild fatigue is often a normal response to activity or life. However, long-term (chronic) or extreme fatigue may be a symptom of a medical condition such as: Depression. Not having enough red blood cells or hemoglobin in the blood (anemia). A problem with a small gland located in the lower front part of the neck (thyroid disorder). Rheumatologic conditions. These are problems related to the body's defense system (immune system). Infections, especially certain viral infections. Fatigue can also lead to negative health outcomes over time. Follow these instructions at home: Medicines Take over-the-counter and prescription medicines only as told by your health care provider. Take a multivitamin if told by your health care provider. Do not use herbal or dietary supplements unless they are approved by your health care provider. Eating and drinking  Avoid heavy meals in the evening. Eat a well-balanced diet, which includes lean proteins, whole grains, plenty of fruits and vegetables, and low-fat dairy products. Avoid eating or drinking too many products with caffeine in them. Avoid alcohol. Drink enough fluid to keep your urine pale yellow. Activity  Exercise regularly, as told by your health care provider. Use or practice techniques to help you relax, such as yoga, tai chi, meditation, or massage therapy. Lifestyle Change situations that cause you stress. Try to keep your work and personal schedules in balance. Do not use recreational or illegal drugs. General instructions Monitor your fatigue for any changes. Go to bed and get up at the same time every day. Avoid fatigue by pacing yourself during the day and getting enough sleep at night. Maintain a healthy weight. Contact a health care  provider if: Your fatigue does not get better. You have a fever. You suddenly lose or gain weight. You have headaches. You have trouble falling asleep or sleeping through the night. You feel angry, guilty, anxious, or sad. You have swelling in your legs or another part of your body. Get help right away if: You feel confused, feel like you might faint, or faint. Your vision is blurry or you have a severe headache. You have severe pain in your abdomen, your back, or the area between your waist and hips (pelvis). You have chest pain, shortness of breath, or an irregular or fast heartbeat. You are unable to urinate, or you urinate less than normal. You have abnormal bleeding from the rectum, nose, lungs, nipples, or, if you are female, the vagina. You vomit blood. You have thoughts about hurting yourself or others. These symptoms may be an emergency. Get help right away. Call 911. Do not wait to see if the symptoms will go away. Do not drive yourself to the hospital. Get help right away if you feel like you may hurt yourself or others, or have thoughts about taking your own life. Go to your nearest emergency room or: Call 911. Call the National Suicide Prevention Lifeline at 1-800-273-8255 or 988. This is open 24 hours a day. Text the Crisis Text Line at 741741. Summary If you have fatigue, you feel tired all the time and have a lack of energy or a lack of motivation. Fatigue may make it difficult to start or complete tasks because of exhaustion. Long-term (chronic) or extreme fatigue may be a symptom of a medical condition. Exercise regularly, as told by your health care provider.   Change situations that cause you stress. Try to keep your work and personal schedules in balance. This information is not intended to replace advice given to you by your health care provider. Make sure you discuss any questions you have with your health care provider. Document Revised: 10/10/2020 Document  Reviewed: 10/10/2020 Elsevier Patient Education  2023 Elsevier Inc.  

## 2022-05-21 NOTE — Assessment & Plan Note (Signed)
Well controlled off medications.

## 2022-05-22 LAB — VITAMIN D 25 HYDROXY (VIT D DEFICIENCY, FRACTURES): VITD: 24.57 ng/mL — ABNORMAL LOW (ref 30.00–100.00)

## 2022-05-22 LAB — VITAMIN B12: Vitamin B-12: 363 pg/mL (ref 211–911)

## 2022-05-24 ENCOUNTER — Ambulatory Visit (INDEPENDENT_AMBULATORY_CARE_PROVIDER_SITE_OTHER): Payer: Medicare Other

## 2022-05-24 ENCOUNTER — Telehealth: Payer: Self-pay

## 2022-05-24 ENCOUNTER — Ambulatory Visit (INDEPENDENT_AMBULATORY_CARE_PROVIDER_SITE_OTHER): Payer: Medicare Other | Admitting: Podiatry

## 2022-05-24 DIAGNOSIS — T8460XA Infection and inflammatory reaction due to internal fixation device of unspecified site, initial encounter: Secondary | ICD-10-CM

## 2022-05-24 DIAGNOSIS — M79672 Pain in left foot: Secondary | ICD-10-CM

## 2022-05-24 NOTE — Progress Notes (Signed)
She presents today for first postop visit date of surgery was 05/18/2018 for removal screw for the hallux left.  Also attempted removal of a screw from the fourth toe.  Also removed a portion of the matrixectomy.  Objective: Muscle stable oriented x 3.  There is no erythema Dem salines drainage or odor sutures are intact margins well coapted.  Radiographs confirm that the distalmost aspect of the screw was removed however the remainder screw is intact think toe is intact itself I see no signs of infection.  Assessment: Healed surgical foot.  Plan: Redressed today dressed breast dressing follow-up with her in 1 to 2 weeks for suture removal.

## 2022-05-25 ENCOUNTER — Encounter: Payer: Self-pay | Admitting: Emergency Medicine

## 2022-05-31 ENCOUNTER — Encounter: Payer: Medicare Other | Admitting: Podiatry

## 2022-06-01 NOTE — Progress Notes (Deleted)
  [name] [address] [place] [address] Phone: [phone] Subjective:    I'm seeing this patient by the request  of:  Sagardia, Eilleen Kempf, MD  CC: Back and neck pain follow-up  BJY:[id number]  Melissa [name] is a 47 y.o. female coming in with complaint of back and neck pain. OMT 04/23/2022. Patient states   Medications patient has been prescribed: None  Taking:         Reviewed prior external information including notes and imaging from previsou exam, outside providers and external EMR if available.   As well as notes that were available from care everywhere and other healthcare systems.  Past medical history, social, surgical and family history all reviewed in electronic medical record.  No pertanent information unless stated regarding to the chief complaint.   Past Medical History:  Diagnosis Date   Anxiety    Arthritis    Carpal tunnel syndrome    Depression    GERD (gastroesophageal reflux disease)    Hammer toe 04/2013   bilateral 4th and 5th    Ingrown left big toenail 04/2013   Mental disorder    Neuromuscular disorder (HCC)    Post term pregnancy over 40 weeks 09/21/2015   SVD (spontaneous vaginal delivery) 09/22/2015   UTI (lower urinary tract infection)     Allergies  Allergen Reactions   Bactrim [Sulfamethoxazole-Trimethoprim]      Review of Systems:  No headache, visual changes, nausea, vomiting, diarrhea, constipation, dizziness, abdominal pain, skin rash, fevers, chills, night sweats, weight loss, swollen lymph nodes, body aches, joint swelling, chest pain, shortness of breath, mood changes. POSITIVE muscle aches  Objective  There were no vitals taken for this visit.   General: No apparent distress alert and oriented x3 mood and affect normal, dressed appropriately.  HEENT: Pupils equal, extraocular movements intact  Respiratory: Patient's speak in full sentences and does not appear short of breath   Cardiovascular: No lower extremity edema, non tender, no erythema  Gait MSK:  Back   Osteopathic findings  C2 flexed rotated and side bent right C6 flexed rotated and side bent left T3 extended rotated and side bent right inhaled rib T9 extended rotated and side bent left L2 flexed rotated and side bent right Sacrum right on right       Assessment and Plan:  No problem-specific Assessment & Plan notes found for this encounter.    Nonallopathic problems  Decision today to treat with OMT was based on Physical Exam  After verbal consent patient was treated with HVLA, ME, FPR techniques in cervical, rib, thoracic, lumbar, and sacral  areas  Patient tolerated the procedure well with improvement in symptoms  Patient given exercises, stretches and lifestyle modifications  See medications in patient instructions if given  Patient will follow up in 4-8 weeks             Note: This dictation was prepared with Dragon dictation along with smaller phrase technology. Any transcriptional errors that result from this process are unintentional.

## 2022-06-04 ENCOUNTER — Ambulatory Visit: Payer: Medicare Other | Admitting: Family Medicine

## 2022-06-06 ENCOUNTER — Other Ambulatory Visit: Payer: Self-pay

## 2022-06-06 ENCOUNTER — Encounter: Payer: Self-pay | Admitting: Family Medicine

## 2022-06-06 DIAGNOSIS — M5412 Radiculopathy, cervical region: Secondary | ICD-10-CM

## 2022-06-07 ENCOUNTER — Ambulatory Visit (INDEPENDENT_AMBULATORY_CARE_PROVIDER_SITE_OTHER): Payer: Medicare Other | Admitting: Podiatry

## 2022-06-07 ENCOUNTER — Encounter: Payer: Self-pay | Admitting: Podiatry

## 2022-06-07 DIAGNOSIS — T8460XA Infection and inflammatory reaction due to internal fixation device of unspecified site, initial encounter: Secondary | ICD-10-CM

## 2022-06-10 ENCOUNTER — Ambulatory Visit
Admission: EM | Admit: 2022-06-10 | Discharge: 2022-06-10 | Disposition: A | Discharge status: Home / Self Care | Payer: Medicare Other | Attending: Family Medicine | Admitting: Family Medicine

## 2022-06-10 ENCOUNTER — Other Ambulatory Visit: Payer: Self-pay

## 2022-06-10 ENCOUNTER — Encounter: Payer: Self-pay | Admitting: Emergency Medicine

## 2022-06-10 DIAGNOSIS — H00033 Abscess of eyelid right eye, unspecified eyelid: Secondary | ICD-10-CM

## 2022-06-10 MED ORDER — IBUPROFEN 800 MG PO TABS: 800.0000 mg | ORAL_TABLET | Freq: Three times a day (TID) | ORAL | 0 refills | Status: DC | PRN

## 2022-06-10 MED ORDER — CEPHALEXIN 250 MG PO CAPS: 250.0000 mg | ORAL_CAPSULE | Freq: Three times a day (TID) | ORAL | 0 refills | Status: AC

## 2022-06-10 NOTE — Progress Notes (Signed)
She presents today for second postop visit date of surgery 05/18/2022 status post of her matrixectomy exostectomy as well as removal screw fourth toe left foot.  States that the dressing got wet.  Objective: Vital signs stable oriented x 3 she presents today with maceration at the incision sites.  Otherwise the foot looks good there is no erythema cellulitis drainage odor or signs of infection.  Assessment: Well-healing surgical foot with the exception of the maceration.  Plan: Recommended that we follow-up with her in 1 week for suture removal and will allow her to keep these exposed so that they can go ahead and dry out and I will remove them in a week.

## 2022-06-10 NOTE — ED Provider Notes (Signed)
[facility]    CSN: [id number] Arrival date & time: 06/10/22  1448      History   Chief Complaint Chief Complaint  Patient presents with   Facial Swelling    HPI Melissa [name] is a 47 y.o. female.   HPI Here for swelling and redness of her right upper eyelid.  It began yesterday.  There is no itching.  It is just hurting.  No fever or chills and no cough or congestion.  She is allergic to sulfa.     last menstrual cycle was sometime ago; she has an IUD  Past Medical History:  Diagnosis Date   Anxiety    Arthritis    Carpal tunnel syndrome    Depression    GERD (gastroesophageal reflux disease)    Hammer toe 04/2013   bilateral 4th and 5th    Ingrown left big toenail 04/2013   Mental disorder    Neuromuscular disorder (HCC)    Post term pregnancy over 40 weeks 09/21/2015   SVD (spontaneous vaginal delivery) 09/22/2015   UTI (lower urinary tract infection)     Patient Active Problem List   Diagnosis Date Noted   Urinary frequency 05/21/2022   Blurry vision, bilateral 05/21/2022   Early menopause 05/21/2022   Mass of left upper extremity 03/25/2022   Vasomotor symptoms due to menopause 10/16/2021   Fatigue 07/06/2021   Breast tenderness 07/06/2021   Nausea 07/06/2021   Essential hypertension 06/28/2021   Chronic pain of both knees 06/28/2021   Weight gain 02/08/2021   Family history of thyroid disease 02/08/2021   Gastroesophageal reflux disease 11/29/2020   History of Helicobacter pylori infection 11/29/2020   De Quervain's disease (radial styloid tenosynovitis) 03/31/2020   Nonallopathic lesion of lumbosacral region 03/01/2019   Nonallopathic lesion of cervical region 03/01/2019   Nonallopathic lesion of sacral region 03/01/2019   Nonallopathic lesion of thoracic region 03/01/2019   Nonallopathic lesion of rib cage 03/01/2019   Vitamin D deficiency 05/08/2018   Piriformis syndrome, left 02/25/2017   Mixed stress and urge urinary  incontinence 02/07/2016   Carpal tunnel syndrome 01/03/2016   Polyarthralgia 01/03/2016   Radial tunnel syndrome 06/17/2014   Cervical radiculopathy 06/10/2014   Bilateral low back pain with right-sided sciatica 06/10/2014   Bipolar depression (HCC) 05/17/2014   Chronic night sweats 06/26/2013   Obesity without serious comorbidity 01/25/2012    Past Surgical History:  Procedure Laterality Date   BREAST LIFT Bilateral    CARPAL TUNNEL RELEASE     CORRECTION HAMMER TOE Bilateral    CRYOABLATION  2004   CYSTOSCOPY WITH HYDRODISTENSION AND BIOPSY  07/28/2004   with urethral dilatation   DILATION AND CURETTAGE OF UTERUS     DILATION AND EVACUATION  12/21/2011   Procedure: DILATATION AND EVACUATION;  Surgeon: Ok Edwards, MD;  Location: [facility];  Service: Gynecology;  Laterality: N/A;  first trimester    ESOPHAGEAL MANOMETRY N/A 02/10/2021   Procedure: ESOPHAGEAL MANOMETRY (EM);  Surgeon: Napoleon Form, MD;  Location: [facility];  Service: Endoscopy;  Laterality: N/A;   HAMMER TOE SURGERY Bilateral 04/20/2013   Procedure: BILATERAL HAMMER TOE REPAIR AND EXCISION NAIL PERMANENT LEFT FIRST;  Surgeon: Alvan Dame, DPM;  Location: [facility];  Service: Podiatry;  Laterality: Bilateral;  Hammer toe repair fourth and fifth toes bilateral feet with screw placement fourth toes and partial nail excision left great toe   HYSTEROSALPINGOGRAM     MASS EXCISION Left 03/27/2022  Procedure: EXCISION SOFT TISSUE MASS x2, LEFT UPPER ARM;  Surgeon: Darnell Level, MD;  Location: [facility];  Service: General;  Laterality: Left;  LMA   REDUCTION MAMMAPLASTY Bilateral 2020   TOE SURGERY     ingrown toenail   WISDOM TOOTH EXTRACTION      OB History     Gravida  6   Para  1   Term  1   Preterm  0   AB  5   Living  1      SAB  2   IAB  1   Ectopic  2   Multiple  0   Live Births  1            Home Medications     Prior to Admission medications   Medication Sig Start Date End Date Taking? Authorizing Provider  cephALEXin (KEFLEX) 250 MG capsule Take 1 capsule (250 mg total) by mouth 3 (three) times daily for 7 days. 06/10/22 06/17/22 Yes Zenia Resides, MD  ibuprofen (ADVIL) 800 MG tablet Take 1 tablet (800 mg total) by mouth every 8 (eight) hours as needed (pain). 06/10/22  Yes Eluzer Howdeshell, Janace Aris, MD  amphetamine-dextroamphetamine (ADDERALL) 20 MG tablet Take 20 mg by mouth 3 (three) times daily. Patient not taking: Reported on 05/21/2022 09/06/21   [provider]  buPROPion (WELLBUTRIN XL) 150 MG 24 hr tablet Take 450 mg by mouth daily. 02/27/21   [provider]  DULoxetine (CYMBALTA) 60 MG capsule Take 60 mg by mouth daily. 09/14/21   [provider]  Fezolinetant (VEOZAH) 45 MG TABS Take 1 tablet (45 mg total) by mouth daily. 05/21/22   Georgina Quint, MD  Olopatadine HCl (PATADAY) 0.2 % SOLN Place 1 drop into both eyes daily as needed. 08/31/21   Marcelyn Bruins, MD  ondansetron (ZOFRAN) 4 MG tablet Take 1 tablet (4 mg total) by mouth every 8 (eight) hours as needed. 05/09/22   Hyatt, Max T, DPM  PARoxetine (PAXIL) 20 MG tablet Take 20 mg by mouth daily. 05/31/22   [provider]  RYALTRIS [phone] MCG/ACT SUSP Place 2 sprays into the nose in the morning and at bedtime. 12/21/21   Hetty Blend, FNP  Salicylic Acid 3 % SHAM Use daily as directed for dry scalp 09/23/17   Olive Bass, FNP  selenium sulfide (SELSUN) 2.5 % shampoo USE LOTION EXTERNALLY  TO AFFECTED AREA ONCE DAILY AS NEEDED IRRITATION 02/15/20   Olive Bass, FNP  traMADol (ULTRAM) 50 MG tablet Take 1 tablet (50 mg total) by mouth every 6 (six) hours as needed for moderate pain. 03/27/22   Darnell Level, MD  Benay Spice 5 % SOLN INSTILL 1 DROP TWICE DAILY INTO [facility] EYE 02/02/20   [provider]    Family History Family History  Problem Relation Age of Onset   Asthma  Mother    Seizures Mother    Arthritis Mother    Lung cancer Father    Thyroid disease Maternal Grandmother    Heart disease Paternal Grandmother        2 CABG   Lung cancer Paternal Grandfather     Social History Social History   Tobacco Use   Smoking status: Never    Passive exposure: Never   Smokeless tobacco: Never  Vaping Use   Vaping Use: Never used  Substance Use Topics   Alcohol use: Yes    Comment: occasionally   Drug use: No  Allergies   Bactrim [sulfamethoxazole-trimethoprim]   Review of Systems Review of Systems   Physical Exam Triage Vital Signs ED Triage Vitals [06/10/22 1517]  Enc Vitals Group     BP 105/69     Pulse Rate 83     Resp 18     Temp 97.6 F (36.4 C)     Temp Source Oral     SpO2 98 %     Weight      Height      Head Circumference      Peak Flow      Pain Score 5     Pain Loc      Pain Edu?      Excl. in GC?    No data found.  Updated Vital Signs BP 105/69 (BP Location: Left Arm)   Pulse 83   Temp 97.6 F (36.4 C) (Oral)   Resp 18   SpO2 98%   Visual Acuity Right Eye Distance:   Left Eye Distance:   Bilateral Distance:    Right Eye Near:   Left Eye Near:    Bilateral Near:     Physical Exam Vitals reviewed.  Constitutional:      General: She is not in acute distress.    Appearance: She is not ill-appearing, toxic-appearing or diaphoretic.  HENT:     Mouth/Throat:     Mouth: Mucous membranes are moist.  Eyes:     Extraocular Movements: Extraocular movements intact.     Pupils: Pupils are equal, round, and reactive to light.     Comments: There is swelling of the right upper eyelid that is diffuse across the eyelid.  It is also erythematous and tender.  There is no fluctuance.  Neither eye is injected.  Skin:    Coloration: Skin is not pale.  Neurological:     Mental Status: She is alert and oriented to person, place, and time.  Psychiatric:        Behavior: Behavior normal.      UC Treatments /  Results  Labs (all labs ordered are listed, but only abnormal results are displayed) Labs Reviewed - No data to display  EKG   Radiology No results found.  Procedures Procedures (including critical care time)  Medications Ordered in UC Medications - No data to display  Initial Impression / Assessment and Plan / UC Course  I have reviewed the triage vital signs and the nursing notes.  Pertinent labs & imaging results that were available during my care of the patient were reviewed by me and considered in my medical decision making (see chart for details).        Keflex is sent in to treat cellulitis of lower eyelid.  Ibuprofen is sent in for pain Final Clinical Impressions(s) / UC Diagnoses   Final diagnoses:  Cellulitis of right eyelid     Discharge Instructions      Take cephalexin 250 mg--1 capsule 3 times daily for 7 days   Take ibuprofen 800 mg--1 tab every 8 hours as needed for pain.  Warm compresses can help      ED Prescriptions     Medication Sig Dispense Auth. Provider   cephALEXin (KEFLEX) 250 MG capsule Take 1 capsule (250 mg total) by mouth 3 (three) times daily for 7 days. 21 capsule Zenia Resides, MD   ibuprofen (ADVIL) 800 MG tablet Take 1 tablet (800 mg total) by mouth every 8 (eight) hours as needed (pain). 21 tablet [address]  K, MD      PDMP not reviewed this encounter.   Zenia Resides, MD 06/10/22 [phone]

## 2022-06-10 NOTE — Discharge Instructions (Signed)
Take cephalexin 250 mg--1 capsule 3 times daily for 7 days   Take ibuprofen 800 mg--1 tab every 8 hours as needed for pain.  Warm compresses can help

## 2022-06-10 NOTE — ED Triage Notes (Signed)
Pt here for right upper eye lid swelling x 3 days

## 2022-06-11 ENCOUNTER — Ambulatory Visit
Admission: RE | Admit: 2022-06-11 | Discharge: 2022-06-11 | Disposition: A | Discharge status: Home / Self Care | Payer: Medicare Other | Source: Ambulatory Visit | Attending: Family Medicine | Admitting: Family Medicine

## 2022-06-11 DIAGNOSIS — M79601 Pain in right arm: Secondary | ICD-10-CM | POA: Diagnosis not present

## 2022-06-11 DIAGNOSIS — M5412 Radiculopathy, cervical region: Secondary | ICD-10-CM

## 2022-06-11 DIAGNOSIS — M25511 Pain in right shoulder: Secondary | ICD-10-CM | POA: Diagnosis not present

## 2022-06-11 DIAGNOSIS — R202 Paresthesia of skin: Secondary | ICD-10-CM | POA: Diagnosis not present

## 2022-06-11 MED ORDER — TRIAMCINOLONE ACETONIDE 40 MG/ML IJ SUSP (RADIOLOGY)
60.0000 mg | Freq: Once | INTRAMUSCULAR | Status: AC
  Administered 2022-06-11: 60 mg via EPIDURAL

## 2022-06-11 MED ORDER — IOPAMIDOL (ISOVUE-M 300) INJECTION 61%
1.0000 mL | Freq: Once | INTRAMUSCULAR | Status: AC | PRN
  Administered 2022-06-11: 1 mL via EPIDURAL

## 2022-06-11 NOTE — Discharge Instructions (Signed)

## 2022-06-16 ENCOUNTER — Other Ambulatory Visit (HOSPITAL_COMMUNITY): Payer: Self-pay

## 2022-06-19 ENCOUNTER — Encounter: Payer: Self-pay | Admitting: Podiatry

## 2022-06-19 ENCOUNTER — Ambulatory Visit (INDEPENDENT_AMBULATORY_CARE_PROVIDER_SITE_OTHER): Payer: Medicare Other | Admitting: Podiatry

## 2022-06-19 DIAGNOSIS — T8460XA Infection and inflammatory reaction due to internal fixation device of unspecified site, initial encounter: Secondary | ICD-10-CM

## 2022-06-19 NOTE — Progress Notes (Signed)
She presents today date of surgery 05/17/2020 removal of screws hallux left and fourth toe left as well as matrixectomy fibular border hallux left.  She states that everything is doing fine she is very happy with the outcome states that for toe feels so much better than it did prior to surgery.  Objective: Vital signs stable oriented x 3 there is no erythema edema salines drainage or odor sutures are intact margins well coapted no signs of infection currently.  Assessment: Well-healing surgical foot.  Plan: Sutures removed today margins remain well coapted encouraged her to leave it for day before washing it is submerging it in water she understands and is amenable to it she will continue to wrap the fourth digit with Coban left foot.  Follow-up with me in 1 month

## 2022-06-21 ENCOUNTER — Encounter: Payer: Medicare Other | Admitting: Podiatry

## 2022-06-27 ENCOUNTER — Telehealth: Payer: Self-pay | Admitting: Emergency Medicine

## 2022-06-27 NOTE — Telephone Encounter (Signed)
Pt wants to make sure that we list her high blood pressure on her PA. Pt hung up on me before I could ask what the PA was for.

## 2022-06-28 ENCOUNTER — Encounter: Payer: Medicare Other | Admitting: Podiatry

## 2022-06-28 ENCOUNTER — Other Ambulatory Visit: Payer: Self-pay | Admitting: Emergency Medicine

## 2022-06-28 ENCOUNTER — Telehealth: Payer: Self-pay | Admitting: Emergency Medicine

## 2022-06-28 MED ORDER — RYBELSUS 14 MG PO TABS
14.0000 mg | ORAL_TABLET | Freq: Every day | ORAL | 3 refills | Status: DC
Start: 2022-06-28 — End: 2022-07-31

## 2022-06-28 NOTE — Telephone Encounter (Signed)
[facility] called and would like someone to call them back that can discuss clinical information about the patient concerning her rybelsius prescription.  Phone:  [phone]  Ref #  [id number]

## 2022-06-28 NOTE — Telephone Encounter (Signed)
Called [facility] for PA information, updated clinical information

## 2022-06-28 NOTE — Telephone Encounter (Signed)
Okay to take Rybelsus.  Is she taking the 7 and 14 mg pill?

## 2022-06-28 NOTE — Telephone Encounter (Signed)
New prescription for Rybelsus 14 mg daily sent to pharmacy of record today.  It may not be covered by insurance due to lack of diabetes diagnosis.  Thanks.

## 2022-06-28 NOTE — Telephone Encounter (Signed)
Patient states she taking 14 mg, but I seen on her historical med list that she was prescribed 7mg 

## 2022-07-03 ENCOUNTER — Telehealth: Payer: Self-pay | Admitting: Emergency Medicine

## 2022-07-03 ENCOUNTER — Other Ambulatory Visit (HOSPITAL_COMMUNITY): Payer: Self-pay

## 2022-07-03 ENCOUNTER — Telehealth: Payer: Self-pay

## 2022-07-03 ENCOUNTER — Other Ambulatory Visit: Payer: Self-pay | Admitting: Emergency Medicine

## 2022-07-03 DIAGNOSIS — N951 Menopausal and female climacteric states: Secondary | ICD-10-CM

## 2022-07-03 NOTE — Telephone Encounter (Signed)
Pharmacy Patient Advocate Encounter   Received notification that prior authorization for Veozah 45mg  is required/requested.   PA submitted to [facility] Medicare via CoverMyMeds Key # [id number] Status is pending

## 2022-07-03 NOTE — Telephone Encounter (Signed)
Patient called and said her VEOZAH 45 MG TABS needs a prior authorization started.  Best callback is [phone].

## 2022-07-04 ENCOUNTER — Other Ambulatory Visit (HOSPITAL_COMMUNITY): Payer: Self-pay

## 2022-07-04 ENCOUNTER — Encounter: Payer: Self-pay | Admitting: *Deleted

## 2022-07-04 NOTE — Telephone Encounter (Signed)
Patient Advocate Encounter  Prior Authorization for [employer]  45mg  has been approved with [facility] [employer].    PA# [id number] Effective dates: 07/03/22 through 01/01/23  Per [facility] test claim, copay for 30 days supply is $11.20  Left a voicemail at [facility] to notify of the approval.

## 2022-07-05 ENCOUNTER — Other Ambulatory Visit (HOSPITAL_COMMUNITY): Payer: Self-pay

## 2022-07-06 NOTE — Telephone Encounter (Signed)
Pt is requesting a status update on her PA.   Please call Pt and advise: [phone]

## 2022-07-09 ENCOUNTER — Other Ambulatory Visit (HOSPITAL_COMMUNITY): Payer: Self-pay

## 2022-07-09 ENCOUNTER — Telehealth: Payer: Self-pay

## 2022-07-09 NOTE — Telephone Encounter (Signed)
Pharmacy Patient Advocate Encounter  Received notification from [facility] that the request for prior authorization for Ryblesus 14mg  has been denied due to Drugs when used for anorexia, weight loss, or weight gain are excluded from coverage under Medicare rules.

## 2022-07-09 NOTE — Telephone Encounter (Signed)
Called patient and informed her that the PA was approved 

## 2022-07-09 NOTE — Telephone Encounter (Signed)
Patient states she needs a PA for the Rybelsus 14mg  ..

## 2022-07-10 NOTE — Telephone Encounter (Signed)
Patient called and wants you to call her back.  States that she spoke to you yesterday.

## 2022-07-13 NOTE — Progress Notes (Deleted)
  [name] [address] [place] [address] Phone: [phone] Subjective:    I'm seeing this patient by the request  of:  Sagardia, Eilleen Kempf, MD  CC:   BJY:[id number]  WILMUTH OLIVETTI is a 47 y.o. female coming in with complaint of back and neck pain. OMT on 04/23/2022. Patient states   Medications patient has been prescribed:   Taking:         Reviewed prior external information including notes and imaging from previsou exam, outside providers and external EMR if available.   As well as notes that were available from care everywhere and other healthcare systems.  Past medical history, social, surgical and family history all reviewed in electronic medical record.  No pertanent information unless stated regarding to the chief complaint.   Past Medical History:  Diagnosis Date   Anxiety    Arthritis    Carpal tunnel syndrome    Depression    GERD (gastroesophageal reflux disease)    Hammer toe 04/2013   bilateral 4th and 5th    Ingrown left big toenail 04/2013   Mental disorder    Neuromuscular disorder (HCC)    Post term pregnancy over 40 weeks 09/21/2015   SVD (spontaneous vaginal delivery) 09/22/2015   UTI (lower urinary tract infection)     Allergies  Allergen Reactions   Bactrim [Sulfamethoxazole-Trimethoprim]      Review of Systems:  No headache, visual changes, nausea, vomiting, diarrhea, constipation, dizziness, abdominal pain, skin rash, fevers, chills, night sweats, weight loss, swollen lymph nodes, body aches, joint swelling, chest pain, shortness of breath, mood changes. POSITIVE muscle aches  Objective  There were no vitals taken for this visit.   General: No apparent distress alert and oriented x3 mood and affect normal, dressed appropriately.  HEENT: Pupils equal, extraocular movements intact  Respiratory: Patient's speak in full sentences and does not appear short of breath  Cardiovascular: No lower  extremity edema, non tender, no erythema  Gait MSK:  Back   Osteopathic findings  C2 flexed rotated and side bent right C6 flexed rotated and side bent left T3 extended rotated and side bent right inhaled rib T9 extended rotated and side bent left L2 flexed rotated and side bent right Sacrum right on right       Assessment and Plan:  No problem-specific Assessment & Plan notes found for this encounter.    Nonallopathic problems  Decision today to treat with OMT was based on Physical Exam  After verbal consent patient was treated with HVLA, ME, FPR techniques in cervical, rib, thoracic, lumbar, and sacral  areas  Patient tolerated the procedure well with improvement in symptoms  Patient given exercises, stretches and lifestyle modifications  See medications in patient instructions if given  Patient will follow up in 4-8 weeks             Note: This dictation was prepared with Dragon dictation along with smaller phrase technology. Any transcriptional errors that result from this process are unintentional.

## 2022-07-15 ENCOUNTER — Other Ambulatory Visit: Payer: Self-pay | Admitting: Emergency Medicine

## 2022-07-15 DIAGNOSIS — I10 Essential (primary) hypertension: Secondary | ICD-10-CM

## 2022-07-16 ENCOUNTER — Ambulatory Visit: Payer: Medicare Other | Admitting: Emergency Medicine

## 2022-07-18 ENCOUNTER — Ambulatory Visit: Payer: Medicare Other | Admitting: Family Medicine

## 2022-07-24 ENCOUNTER — Encounter: Payer: Medicare Other | Admitting: Podiatry

## 2022-07-31 ENCOUNTER — Other Ambulatory Visit: Payer: Self-pay | Admitting: Emergency Medicine

## 2022-07-31 MED ORDER — WEGOVY 0.5 MG/0.5ML ~~LOC~~ SOAJ: 0.5000 mg | SUBCUTANEOUS | 3 refills | Status: DC

## 2022-07-31 NOTE — Telephone Encounter (Signed)
Patient called and asked if Dr. Alvy Bimler can send in a prescription for [facility]. She said she spoke with her insurance and they told her he would just need to send it in. Best callback is [phone].

## 2022-07-31 NOTE — Telephone Encounter (Signed)
New prescription for [facility] sent to pharmacy of record today.  Thanks.

## 2022-08-01 ENCOUNTER — Other Ambulatory Visit (HOSPITAL_COMMUNITY): Payer: Self-pay

## 2022-08-01 NOTE — Telephone Encounter (Signed)
PA needed for Wegovy.

## 2022-08-02 ENCOUNTER — Telehealth: Payer: Self-pay

## 2022-08-02 ENCOUNTER — Other Ambulatory Visit (HOSPITAL_COMMUNITY): Payer: Self-pay

## 2022-08-02 NOTE — Telephone Encounter (Signed)
Pharmacy Patient Advocate Encounter   Received notification from Patient Advice Request messages that prior authorization for Wegovy 0.5MG /0.5ML auto-injectors is required/requested.   Insurance verification completed.   The patient is insured through [facility] .   Per test claim: PA required; PA submitted to [facility] via CoverMyMeds Key/confirmation #/E[facility] Status is pending

## 2022-08-03 NOTE — Telephone Encounter (Signed)
Pharmacy Patient Advocate Encounter  Received notification from [facility] that Prior Authorization for Wegovy 0.5MG /0.5ML auto-injectors has been DENIED. Please advise how you'd like to proceed. Full denial letter will be uploaded to the media tab. See denial reason below.  PA #/Case ID/Reference #: [id number]  Denial reason: Plan exclusion

## 2022-08-04 ENCOUNTER — Other Ambulatory Visit: Payer: Self-pay | Admitting: Emergency Medicine

## 2022-08-04 ENCOUNTER — Other Ambulatory Visit: Payer: Self-pay | Admitting: Family Medicine

## 2022-08-04 DIAGNOSIS — I10 Essential (primary) hypertension: Secondary | ICD-10-CM

## 2022-08-08 ENCOUNTER — Telehealth: Payer: Self-pay | Admitting: Emergency Medicine

## 2022-08-08 NOTE — Telephone Encounter (Signed)
FYI, patient Melissa [name] was denied.

## 2022-08-08 NOTE — Telephone Encounter (Signed)
Sent message to Dr. Alvy Bimler, as well as mychart patient back

## 2022-08-08 NOTE — Telephone Encounter (Signed)
Thank you :)

## 2022-08-08 NOTE — Telephone Encounter (Signed)
Patient called and states that on the PA for [facility] it needs to state that it is for high blood pressure.

## 2022-08-24 NOTE — Progress Notes (Unsigned)
[name] [address] [place] [address] Phone: [phone] Subjective:   INadine Counts, am serving as a scribe for Dr. Antoine Primas.  I'm seeing this patient by the request  of:  Sagardia, Eilleen Kempf, MD  CC: Back and neck pain follow-up  [id number]  Melissa [name] is a 47 y.o. female coming in with complaint of back and neck pain. OMT 04/23/2022.  Continues to have discomfort and pain on regular basis  Medications patient has been prescribed: Effexor  Taking:         Reviewed prior external information including notes and imaging from previsou exam, outside providers and external EMR if available.   As well as notes that were available from care everywhere and other healthcare systems.  Past medical history, social, surgical and family history all reviewed in electronic medical record.  No pertanent information unless stated regarding to the chief complaint.   Past Medical History:  Diagnosis Date   Anxiety    Arthritis    Carpal tunnel syndrome    Depression    GERD (gastroesophageal reflux disease)    Hammer toe 04/2013   bilateral 4th and 5th    Ingrown left big toenail 04/2013   Mental disorder    Neuromuscular disorder (HCC)    Post term pregnancy over 40 weeks 09/21/2015   SVD (spontaneous vaginal delivery) 09/22/2015   UTI (lower urinary tract infection)     Allergies  Allergen Reactions   Bactrim [Sulfamethoxazole-Trimethoprim]      Review of Systems:  No, visual changes, nausea, vomiting, diarrhea, constipation, dizziness, abdominal pain, skin rash, fevers, chills, night sweats, weight loss, swollen lymph nodes, joint swelling, chest pain, shortness of breath, mood changes. POSITIVE muscle aches, body aches, headaches  Objective  Blood pressure 120/84, pulse (!) 115, height 5\' 1"  (1.549 m), SpO2 98%.   General: No apparent distress alert and oriented x3 mood and affect normal, dressed appropriately.   HEENT: Pupils equal, extraocular movements intact  Respiratory: Patient's speak in full sentences and does not appear short of breath  Cardiovascular: No lower extremity edema, non tender, no erythema  Gait relatively normal MSK:  Back does have some loss lordosis noted.  Some tenderness to palpation of the paraspinal musculature.  Patient does have some limited sidebending of the neck bilaterally.  Seems to be right greater than left.  Significant tightness around the right sacroiliac joint.  Positive FABER test.  Osteopathic findings  C3 flexed rotated and side bent right C5 flexed rotated and side bent left T3 extended rotated and side bent right inhaled rib T9 extended rotated and side bent left L1 flexed rotated and side bent right Sacrum right on right       Assessment and Plan:  Polyarthralgia Continues to have pain out of proportion to the amount of palpation.  Discussed icing regimen and home exercises, discussed which activities to do and which ones to avoid.  Increase activity slowly follow-up with me in 6 to 8 weeks otherwise Toradol and Depo-Medrol injections given today.    Nonallopathic problems  Decision today to treat with OMT was based on Physical Exam  After verbal consent patient was treated with HVLA, ME, FPR techniques in cervical, rib, thoracic, lumbar, and sacral  areas  Patient tolerated the procedure well with improvement in symptoms  Patient given exercises, stretches and lifestyle modifications  See medications in patient instructions if given  Patient will follow up in 4-8 weeks  The above documentation has been reviewed and is accurate and complete Judi Saa, DO          Note: This dictation was prepared with Dragon dictation along with smaller phrase technology. Any transcriptional errors that result from this process are unintentional.

## 2022-08-27 ENCOUNTER — Encounter: Payer: Self-pay | Admitting: Family Medicine

## 2022-08-27 ENCOUNTER — Ambulatory Visit (INDEPENDENT_AMBULATORY_CARE_PROVIDER_SITE_OTHER): Payer: Medicare Other | Admitting: Family Medicine

## 2022-08-27 VITALS — BP 120/84 | HR 115 | Ht 61.0 in

## 2022-08-27 DIAGNOSIS — M9903 Segmental and somatic dysfunction of lumbar region: Secondary | ICD-10-CM | POA: Diagnosis not present

## 2022-08-27 DIAGNOSIS — M2550 Pain in unspecified joint: Secondary | ICD-10-CM | POA: Diagnosis not present

## 2022-08-27 DIAGNOSIS — M9901 Segmental and somatic dysfunction of cervical region: Secondary | ICD-10-CM | POA: Diagnosis not present

## 2022-08-27 DIAGNOSIS — M9904 Segmental and somatic dysfunction of sacral region: Secondary | ICD-10-CM

## 2022-08-27 DIAGNOSIS — M9908 Segmental and somatic dysfunction of rib cage: Secondary | ICD-10-CM | POA: Diagnosis not present

## 2022-08-27 DIAGNOSIS — M9902 Segmental and somatic dysfunction of thoracic region: Secondary | ICD-10-CM

## 2022-08-27 MED ORDER — KETOROLAC TROMETHAMINE 60 MG/2ML IM SOLN
60.0000 mg | Freq: Once | INTRAMUSCULAR | Status: AC
  Administered 2022-08-27: 60 mg via INTRAMUSCULAR

## 2022-08-27 MED ORDER — METHYLPREDNISOLONE ACETATE 80 MG/ML IJ SUSP
80.0000 mg | Freq: Once | INTRAMUSCULAR | Status: AC
  Administered 2022-08-27: 80 mg via INTRAMUSCULAR

## 2022-08-27 NOTE — Assessment & Plan Note (Addendum)
Continues to have pain out of proportion to the amount of palpation.  Discussed icing regimen and home exercises, discussed which activities to do and which ones to avoid.  Increase activity slowly follow-up with me in 6 to 8 weeks otherwise Toradol and Depo-Medrol injections given today.

## 2022-08-27 NOTE — Patient Instructions (Signed)
Good luck with IT problem See you again in 8 weeks

## 2022-09-12 ENCOUNTER — Other Ambulatory Visit: Payer: Self-pay | Admitting: Emergency Medicine

## 2022-09-12 DIAGNOSIS — N951 Menopausal and female climacteric states: Secondary | ICD-10-CM

## 2022-09-28 ENCOUNTER — Other Ambulatory Visit: Payer: Self-pay

## 2022-09-28 ENCOUNTER — Telehealth: Payer: Self-pay | Admitting: Family Medicine

## 2022-09-28 DIAGNOSIS — M5412 Radiculopathy, cervical region: Secondary | ICD-10-CM

## 2022-09-28 NOTE — Telephone Encounter (Signed)
Order placed

## 2022-09-28 NOTE — Telephone Encounter (Signed)
Pt requesting order for repeat cervical epidural. Last done 06/11/2022.

## 2022-10-03 NOTE — Discharge Instructions (Signed)

## 2022-10-04 ENCOUNTER — Encounter: Payer: Self-pay | Admitting: Emergency Medicine

## 2022-10-04 ENCOUNTER — Ambulatory Visit
Admission: RE | Admit: 2022-10-04 | Discharge: 2022-10-04 | Disposition: A | Discharge status: Home / Self Care | Payer: Medicare Other | Source: Ambulatory Visit | Attending: Family Medicine | Admitting: Family Medicine

## 2022-10-04 ENCOUNTER — Other Ambulatory Visit: Payer: Self-pay | Admitting: Emergency Medicine

## 2022-10-04 ENCOUNTER — Ambulatory Visit (INDEPENDENT_AMBULATORY_CARE_PROVIDER_SITE_OTHER): Payer: Medicare Other | Admitting: Emergency Medicine

## 2022-10-04 VITALS — BP 132/70 | HR 80 | Temp 98.3°F | Ht 61.0 in | Wt 152.0 lb

## 2022-10-04 DIAGNOSIS — R439 Unspecified disturbances of smell and taste: Secondary | ICD-10-CM | POA: Insufficient documentation

## 2022-10-04 DIAGNOSIS — M5412 Radiculopathy, cervical region: Secondary | ICD-10-CM

## 2022-10-04 DIAGNOSIS — M50122 Cervical disc disorder at C5-C6 level with radiculopathy: Secondary | ICD-10-CM | POA: Diagnosis not present

## 2022-10-04 DIAGNOSIS — H938X1 Other specified disorders of right ear: Secondary | ICD-10-CM | POA: Diagnosis not present

## 2022-10-04 DIAGNOSIS — K219 Gastro-esophageal reflux disease without esophagitis: Secondary | ICD-10-CM | POA: Diagnosis not present

## 2022-10-04 DIAGNOSIS — E6609 Other obesity due to excess calories: Secondary | ICD-10-CM

## 2022-10-04 DIAGNOSIS — F5104 Psychophysiologic insomnia: Secondary | ICD-10-CM | POA: Diagnosis not present

## 2022-10-04 DIAGNOSIS — N951 Menopausal and female climacteric states: Secondary | ICD-10-CM

## 2022-10-04 MED ORDER — TIRZEPATIDE-WEIGHT MANAGEMENT 2.5 MG/0.5ML ~~LOC~~ SOLN
2.5000 mg | SUBCUTANEOUS | 3 refills | Status: DC
Start: 2022-10-04 — End: 2022-11-14

## 2022-10-04 MED ORDER — HYDROXYZINE HCL 25 MG PO TABS
25.0000 mg | ORAL_TABLET | Freq: Every evening | ORAL | 1 refills | Status: DC | PRN
Start: 2022-10-04 — End: 2022-12-19

## 2022-10-04 MED ORDER — IOPAMIDOL (ISOVUE-M 300) INJECTION 61%
1.0000 mL | Freq: Once | INTRAMUSCULAR | Status: AC | PRN
  Administered 2022-10-04: 1 mL via EPIDURAL

## 2022-10-04 MED ORDER — TRIAMCINOLONE ACETONIDE 40 MG/ML IJ SUSP (RADIOLOGY)
60.0000 mg | Freq: Once | INTRAMUSCULAR | Status: AC
  Administered 2022-10-04: 60 mg via EPIDURAL

## 2022-10-04 NOTE — Assessment & Plan Note (Signed)
Rybelsus not approved Diet and nutrition discussed May try Zepbound weekly

## 2022-10-04 NOTE — Telephone Encounter (Signed)
Her medical insurance is not going to cover any of these medications.

## 2022-10-04 NOTE — Progress Notes (Signed)
Melissa [name] 47 y.o.   Chief Complaint  Patient presents with   Ear Fullness    Patient states she feels like there is something is in her right ear,  Patient states she is having a "phantom" smell. She states she only smell this at her home. Poss referral to ENT    HISTORY OF PRESENT ILLNESS: This is a 47 y.o. female complaining of right ear fullness sensation for the past several weeks Also complaining of chronic smell problem for many years.  Able to smell things that her doctor.  Intermittent problem without any other associated symptoms Menopause symptoms much better with the also Chronic insomnia unresponsive to over-the-counter medications Still struggling with weight gain problems No other complaints or medical concerns today.  Ear Fullness  Pertinent negatives include no abdominal pain, coughing, diarrhea, ear discharge, headaches, hearing loss, rash, sore throat or vomiting.     Prior to Admission medications   Medication Sig Start Date End Date Taking? Authorizing Provider  amphetamine-dextroamphetamine (ADDERALL) 20 MG tablet Take 20 mg by mouth 3 (three) times daily. 09/06/21  Yes [provider]  buPROPion (WELLBUTRIN XL) 150 MG 24 hr tablet Take 450 mg by mouth daily. 02/27/21  Yes [provider]  DULoxetine (CYMBALTA) 60 MG capsule Take 60 mg by mouth daily. 09/14/21  Yes [provider]  hydrOXYzine (ATARAX) 25 MG tablet Take 1 tablet (25 mg total) by mouth at bedtime as needed. 10/04/22  Yes Lynessa Almanzar, Eilleen Kempf, MD  ibuprofen (ADVIL) 800 MG tablet Take 1 tablet (800 mg total) by mouth every 8 (eight) hours as needed (pain). 06/10/22  Yes Banister, Janace Aris, MD  Olopatadine HCl (PATADAY) 0.2 % SOLN Place 1 drop into both eyes daily as needed. 08/31/21  Yes Padgett, Pilar Grammes, MD  ondansetron (ZOFRAN) 4 MG tablet Take 1 tablet (4 mg total) by mouth every 8 (eight) hours as needed. 05/09/22  Yes Hyatt, Max T, DPM  PARoxetine (PAXIL) 20  MG tablet Take 20 mg by mouth daily. 05/31/22  Yes [provider]  RYALTRIS [phone] MCG/ACT SUSP Place 2 sprays into the nose in the morning and at bedtime. 12/21/21  Yes AmbsNorvel Richards, FNP  Salicylic Acid 3 % SHAM Use daily as directed for dry scalp 09/23/17  Yes Olive Bass, FNP  selenium sulfide (SELSUN) 2.5 % shampoo USE LOTION EXTERNALLY  TO AFFECTED AREA ONCE DAILY AS NEEDED IRRITATION 02/15/20  Yes Olive Bass, FNP  tirzepatide [facility]) 2.5 MG/0.5ML injection vial Inject 2.5 mg into the skin once a week. 10/04/22  Yes Malea Swilling, Eilleen Kempf, MD  traMADol (ULTRAM) 50 MG tablet Take 1 tablet (50 mg total) by mouth every 6 (six) hours as needed for moderate pain. 03/27/22  Yes Darnell Level, MD  venlafaxine XR (EFFEXOR-XR) 37.5 MG 24 hr capsule TAKE 1 CAPSULE BY MOUTH DAILY WITH BREAKFAST. 08/05/22  Yes Judi Saa, DO  VEOZAH 45 MG TABS TAKE 1 TABLET BY MOUTH EVERY DAY 09/12/22  Yes Georgina Quint, MD  XIIDRA 5 % SOLN INSTILL 1 DROP TWICE DAILY INTO [facility] EYE 02/02/20  Yes [provider]    Allergies  Allergen Reactions   Bactrim [Sulfamethoxazole-Trimethoprim]     Patient Active Problem List   Diagnosis Date Noted   Early menopause 05/21/2022   Vasomotor symptoms due to menopause 10/16/2021   Essential hypertension 06/28/2021   Chronic pain of both knees 06/28/2021   Weight gain 02/08/2021   Family history of thyroid disease 02/08/2021  Gastroesophageal reflux disease 11/29/2020   History of Helicobacter pylori infection 11/29/2020   Nonallopathic lesion of lumbosacral region 03/01/2019   Nonallopathic lesion of cervical region 03/01/2019   Nonallopathic lesion of sacral region 03/01/2019   Nonallopathic lesion of thoracic region 03/01/2019   Nonallopathic lesion of rib cage 03/01/2019   Vitamin D deficiency 05/08/2018   Piriformis syndrome, left 02/25/2017   Mixed stress and urge urinary incontinence 02/07/2016   Carpal tunnel  syndrome 01/03/2016   Polyarthralgia 01/03/2016   Radial tunnel syndrome 06/17/2014   Cervical radiculopathy 06/10/2014   Bipolar depression (HCC) 05/17/2014   Chronic night sweats 06/26/2013   Obesity without serious comorbidity 01/25/2012    Past Medical History:  Diagnosis Date   Anxiety    Arthritis    Carpal tunnel syndrome    Depression    GERD (gastroesophageal reflux disease)    Hammer toe 04/2013   bilateral 4th and 5th    Ingrown left big toenail 04/2013   Mental disorder    Neuromuscular disorder (HCC)    Post term pregnancy over 40 weeks 09/21/2015   SVD (spontaneous vaginal delivery) 09/22/2015   UTI (lower urinary tract infection)     Past Surgical History:  Procedure Laterality Date   BREAST LIFT Bilateral    CARPAL TUNNEL RELEASE     CORRECTION HAMMER TOE Bilateral    CRYOABLATION  2004   CYSTOSCOPY WITH HYDRODISTENSION AND BIOPSY  07/28/2004   with urethral dilatation   DILATION AND CURETTAGE OF UTERUS     DILATION AND EVACUATION  12/21/2011   Procedure: DILATATION AND EVACUATION;  Surgeon: Ok Edwards, MD;  Location: [facility] [place];  Service: Gynecology;  Laterality: N/A;  first trimester    ESOPHAGEAL MANOMETRY N/A 02/10/2021   Procedure: ESOPHAGEAL MANOMETRY (EM);  Surgeon: Napoleon Form, MD;  Location: [facility];  Service: Endoscopy;  Laterality: N/A;   HAMMER TOE SURGERY Bilateral 04/20/2013   Procedure: BILATERAL HAMMER TOE REPAIR AND EXCISION NAIL PERMANENT LEFT FIRST;  Surgeon: Alvan Dame, DPM;  Location:  SURGERY CENTER;  Service: Podiatry;  Laterality: Bilateral;  Hammer toe repair fourth and fifth toes bilateral feet with screw placement fourth toes and partial nail excision left great toe   HYSTEROSALPINGOGRAM     MASS EXCISION Left 03/27/2022   Procedure: EXCISION SOFT TISSUE MASS x2, LEFT UPPER ARM;  Surgeon: Darnell Level, MD;  Location: [facility] ;  Service: General;  Laterality: Left;  LMA    REDUCTION MAMMAPLASTY Bilateral 2020   TOE SURGERY     ingrown toenail   WISDOM TOOTH EXTRACTION      Social History   Socioeconomic History   Marital status: Single    Spouse name: Not on file   Number of children: 1   Years of education: Not on file   Highest education level: Not on file  Occupational History   Occupation: Homemaker  Tobacco Use   Smoking status: Never    Passive exposure: Never   Smokeless tobacco: Never  Vaping Use   Vaping status: Never Used  Substance and Sexual Activity   Alcohol use: Yes    Comment: occasionally   Drug use: No   Sexual activity: Yes    Birth control/protection: I.U.D.  Other Topics Concern   Not on file  Social History Narrative   Not on file   Social Determinants of Health   Financial Resource Strain: Low Risk  (10/11/2021)   Overall Financial Resource Strain (CARDIA)  Difficulty of Paying Living Expenses: Not hard at all  Food Insecurity: No Food Insecurity (10/11/2021)   Hunger Vital Sign    Worried About Running Out of Food in the Last Year: Never true    Ran Out of Food in the Last Year: Never true  Transportation Needs: No Transportation Needs (10/11/2021)   PRAPARE - Administrator, Civil Service (Medical): No    Lack of Transportation (Non-Medical): No  Physical Activity: Sufficiently Active (10/11/2021)   Exercise Vital Sign    Days of Exercise per Week: 3 days    Minutes of Exercise per Session: 60 min  Stress: No Stress Concern Present (10/11/2021)   [employer] of Occupational Health - Occupational Stress Questionnaire    Feeling of Stress : Not at all  Social Connections: Unknown (10/11/2021)   Social Connection and Isolation Panel [NHANES]    Frequency of Communication with Friends and Family: More than three times a week    Frequency of Social Gatherings with Friends and Family: More than three times a week    Attends Religious Services: More than 4 times per year    Active Member of  [employer] or Organizations: No    Attends Banker Meetings: Never    Marital Status: Patient declined  Catering manager Violence: Not At Risk (10/11/2021)   Humiliation, Afraid, Rape, and Kick questionnaire    Fear of Current or Ex-Partner: No    Emotionally Abused: No    Physically Abused: No    Sexually Abused: No    Family History  Problem Relation Age of Onset   Asthma Mother    Seizures Mother    Arthritis Mother    Lung cancer Father    Thyroid disease Maternal Grandmother    Heart disease Paternal Grandmother        2 CABG   Lung cancer Paternal Grandfather      Review of Systems  Constitutional: Negative.  Negative for chills and fever.  HENT:  Negative for congestion, ear discharge, ear pain, hearing loss and sore throat.   Respiratory: Negative.  Negative for cough and shortness of breath.   Cardiovascular: Negative.  Negative for chest pain and palpitations.  Gastrointestinal:  Negative for abdominal pain, diarrhea, nausea and vomiting.  Genitourinary: Negative.  Negative for dysuria and hematuria.  Skin: Negative.  Negative for rash.  Neurological: Negative.  Negative for dizziness and headaches.  Psychiatric/Behavioral:  The patient has insomnia.   All other systems reviewed and are negative.   Vitals:   10/04/22 1104  BP: 132/70  Pulse: 80  Temp: 98.3 F (36.8 C)  SpO2: 97%    Physical Exam Vitals reviewed.  Constitutional:      Appearance: Normal appearance.  HENT:     Head: Normocephalic.     Right Ear: Tympanic membrane, ear canal and external ear normal.     Left Ear: Tympanic membrane, ear canal and external ear normal.     Mouth/Throat:     Mouth: Mucous membranes are moist.     Pharynx: Oropharynx is clear.  Eyes:     Extraocular Movements: Extraocular movements intact.     Conjunctiva/sclera: Conjunctivae normal.     Pupils: Pupils are equal, round, and reactive to light.  Cardiovascular:     Rate and Rhythm: Normal rate and  regular rhythm.     Pulses: Normal pulses.     Heart sounds: Normal heart sounds.  Pulmonary:     Effort: Pulmonary effort is  normal.     Breath sounds: Normal breath sounds.  Abdominal:     Palpations: Abdomen is soft.     Tenderness: There is no abdominal tenderness.  Musculoskeletal:     Cervical back: No tenderness.  Lymphadenopathy:     Cervical: No cervical adenopathy.  Skin:    General: Skin is warm and dry.     Capillary Refill: Capillary refill takes less than 2 seconds.  Neurological:     General: No focal deficit present.     Mental Status: She is alert and oriented to person, place, and time.  Psychiatric:        Mood and Affect: Mood normal.        Behavior: Behavior normal.      ASSESSMENT & PLAN: A total of 42 minutes was spent with the patient and counseling/coordination of care regarding preparing for this visit, review of most recent office visit notes, review of multiple chronic medical conditions under management, review of all medications, need for ENT evaluation, diagnosis and treatment of chronic insomnia, education on nutrition, prognosis, documentation, and need for follow-up.  Problem List Items Addressed This Visit       Digestive   Gastroesophageal reflux disease    Clinically stable and asymptomatic Not taking medication at present time        Nervous and Auditory   Sensation of fullness in ear, right    Benign physical examination. No decreased hearing Recommend ENT evaluation Referral placed today      Relevant Orders   Ambulatory referral to ENT   Smell or taste problem - Primary    Chronic problem affecting quality of life Recommend ENT evaluation Referral placed today.      Relevant Orders   Ambulatory referral to ENT     Other   Obesity without serious comorbidity    Rybelsus not approved Diet and nutrition discussed May try Zepbound weekly      Relevant Medications   tirzepatide (ZEPBOUND) 2.5 MG/0.5ML injection  vial   Vasomotor symptoms due to menopause    Much improved with Veozah 45 mg daily      Chronic insomnia   Relevant Medications   hydrOXYzine (ATARAX) 25 MG tablet   Patient Instructions  Health Maintenance, Female Adopting a healthy lifestyle and getting preventive care are important in promoting health and wellness. Ask your health care provider about: The right schedule for you to have regular tests and exams. Things you can do on your own to prevent diseases and keep yourself healthy. What should I know about diet, weight, and exercise? Eat a healthy diet  Eat a diet that includes plenty of vegetables, fruits, low-fat dairy products, and lean protein. Do not eat a lot of foods that are high in solid fats, added sugars, or sodium. Maintain a healthy weight Body mass index (BMI) is used to identify weight problems. It estimates body fat based on height and weight. Your health care provider can help determine your BMI and help you achieve or maintain a healthy weight. Get regular exercise Get regular exercise. This is one of the most important things you can do for your health. Most adults should: Exercise for at least 150 minutes each week. The exercise should increase your heart rate and make you sweat (moderate-intensity exercise). Do strengthening exercises at least twice a week. This is in addition to the moderate-intensity exercise. Spend less time sitting. Even light physical activity can be beneficial. Watch cholesterol and blood lipids Have your blood  tested for lipids and cholesterol at 47 years of age, then have this test every 5 years. Have your cholesterol levels checked more often if: Your lipid or cholesterol levels are high. You are older than 47 years of age. You are at high risk for heart disease. What should I know about cancer screening? Depending on your health history and family history, you may need to have cancer screening at various ages. This may include  screening for: Breast cancer. Cervical cancer. Colorectal cancer. Skin cancer. Lung cancer. What should I know about heart disease, diabetes, and high blood pressure? Blood pressure and heart disease High blood pressure causes heart disease and increases the risk of stroke. This is more likely to develop in people who have high blood pressure readings or are overweight. Have your blood pressure checked: Every 3-5 years if you are 83-25 years of age. Every year if you are 9 years old or older. Diabetes Have regular diabetes screenings. This checks your fasting blood sugar level. Have the screening done: Once every three years after age 39 if you are at a normal weight and have a low risk for diabetes. More often and at a younger age if you are overweight or have a high risk for diabetes. What should I know about preventing infection? Hepatitis B If you have a higher risk for hepatitis B, you should be screened for this virus. Talk with your health care provider to find out if you are at risk for hepatitis B infection. Hepatitis C Testing is recommended for: Everyone born from [birth year] through 1965. Anyone with known risk factors for hepatitis C. Sexually transmitted infections (STIs) Get screened for STIs, including gonorrhea and chlamydia, if: You are sexually active and are younger than 47 years of age. You are older than 47 years of age and your health care provider tells you that you are at risk for this type of infection. Your sexual activity has changed since you were last screened, and you are at increased risk for chlamydia or gonorrhea. Ask your health care provider if you are at risk. Ask your health care provider about whether you are at high risk for HIV. Your health care provider may recommend a prescription medicine to help prevent HIV infection. If you choose to take medicine to prevent HIV, you should first get tested for HIV. You should then be tested every 3 months for as  long as you are taking the medicine. Pregnancy If you are about to stop having your period (premenopausal) and you may become pregnant, seek counseling before you get pregnant. Take 400 to 800 micrograms (mcg) of folic acid every day if you become pregnant. Ask for birth control (contraception) if you want to prevent pregnancy. Osteoporosis and menopause Osteoporosis is a disease in which the bones lose minerals and strength with aging. This can result in bone fractures. If you are 19 years old or older, or if you are at risk for osteoporosis and fractures, ask your health care provider if you should: Be screened for bone loss. Take a calcium or vitamin D supplement to lower your risk of fractures. Be given hormone replacement therapy (HRT) to treat symptoms of menopause. Follow these instructions at home: Alcohol use Do not drink alcohol if: Your health care provider tells you not to drink. You are pregnant, may be pregnant, or are planning to become pregnant. If you drink alcohol: Limit how much you have to: 0-1 drink a day. Know how much alcohol is in your  drink. In the [place] one drink equals one 12 oz bottle of beer (355 mL), one 5 oz glass of wine (148 mL), or one 1 oz glass of hard liquor (44 mL). Lifestyle Do not use any products that contain nicotine or tobacco. These products include cigarettes, chewing tobacco, and vaping devices, such as e-cigarettes. If you need help quitting, ask your health care provider. Do not use street drugs. Do not share needles. Ask your health care provider for help if you need support or information about quitting drugs. General instructions Schedule regular health, dental, and eye exams. Stay current with your vaccines. Tell your health care provider if: You often feel depressed. You have ever been abused or do not feel safe at home. Summary Adopting a healthy lifestyle and getting preventive care are important in promoting health and  wellness. Follow your health care provider's instructions about healthy diet, exercising, and getting tested or screened for diseases. Follow your health care provider's instructions on monitoring your cholesterol and blood pressure. This information is not intended to replace advice given to you by your health care provider. Make sure you discuss any questions you have with your health care provider. Document Revised: 05/09/2020 Document Reviewed: 05/09/2020 Elsevier Patient Education  2024 Elsevier Inc.      Edwina Barth, MD [facility]

## 2022-10-04 NOTE — Patient Instructions (Signed)

## 2022-10-04 NOTE — Assessment & Plan Note (Signed)
Clinically stable and asymptomatic Not taking medication at present time

## 2022-10-04 NOTE — Assessment & Plan Note (Signed)
Much improved with Veozah 45 mg daily

## 2022-10-04 NOTE — Assessment & Plan Note (Signed)
Benign physical examination. No decreased hearing Recommend ENT evaluation Referral placed today

## 2022-10-04 NOTE — Assessment & Plan Note (Signed)
Chronic problem affecting quality of life Recommend ENT evaluation Referral placed today.

## 2022-10-05 NOTE — Progress Notes (Unsigned)
[name] [address] [place] [address] Phone: [phone] Subjective:   INadine Counts, am serving as a scribe for Dr. Antoine Primas.  I'm seeing this patient by the request  of:  Sagardia, Eilleen Kempf, MD  CC: Thumb pain, neck pain and back pain  YQM:[id number]  Melissa [name] is a 47 y.o. female coming in with complaint of back and neck pain. OMT 08/27/2022.  Epidural 10/04/2022.Patient states continues to have some discomfort and pain noted as well.  Tightness of the neck with sidebending.  Medications patient has been prescribed: Effexor  Taking:         Reviewed prior external information including notes and imaging from previsou exam, outside providers and external EMR if available.   As well as notes that were available from care everywhere and other healthcare systems.  Past medical history, social, surgical and family history all reviewed in electronic medical record.  No pertanent information unless stated regarding to the chief complaint.   Past Medical History:  Diagnosis Date   Anxiety    Arthritis    Carpal tunnel syndrome    Depression    GERD (gastroesophageal reflux disease)    Hammer toe 04/2013   bilateral 4th and 5th    Ingrown left big toenail 04/2013   Mental disorder    Neuromuscular disorder (HCC)    Post term pregnancy over 40 weeks 09/21/2015   SVD (spontaneous vaginal delivery) 09/22/2015   UTI (lower urinary tract infection)     Allergies  Allergen Reactions   Bactrim [Sulfamethoxazole-Trimethoprim]      Review of Systems:  No headache, visual changes, nausea, vomiting, diarrhea, constipation, dizziness, abdominal pain, skin rash, fevers, chills, night sweats, weight loss, swollen lymph nodes, body aches, joint swelling, chest pain, shortness of breath, mood changes. POSITIVE muscle aches  Objective  Blood pressure (!) 122/90, pulse 89, height 5\' 1"  (1.549 m), weight 150 lb (68 kg), SpO2 97%.    General: No apparent distress alert and oriented x3 mood and affect normal, dressed appropriately.  HEENT: Pupils equal, extraocular movements intact  Respiratory: Patient's speak in full sentences and does not appear short of breath  Cardiovascular: No lower extremity edema, non tender, no erythema  Neck exam does have some loss lordosis noted.  Some difficulty with extension of the neck noted.  Osteopathic findings  C3 flexed rotated and side bent right C7 flexed rotated and side bent left T6 extended rotated and side bent right inhaled rib L3 flexed rotated and side bent right Sacrum right on right       Assessment and Plan:  Arthritis of carpometacarpal (CMC) joint of both thumbs Patient concerned of the thumb pains.  Has had carpal tunnel on the hand surgery for this.  Discussed potential need for other treatment options.  Polyarthralgia Likely with this and patient's neck pain has responded extremely well though to osteopathic manipulation.  Continuing on the Effexor does help some of the neck discomfort as well.  Discussed icing regimen.  Epidural did help the neck initially but continues to have some discomfort.  Follow-up again in 6 to 8 weeks otherwise.    Nonallopathic problems  Decision today to treat with OMT was based on Physical Exam  After verbal consent patient was treated with HVLA, ME, FPR techniques in cervical, rib, thoracic, lumbar, and sacral  areas  Patient tolerated the procedure well with improvement in symptoms  Patient given exercises, stretches and lifestyle modifications  See  medications in patient instructions if given  Patient will follow up in 4-8 weeks     The above documentation has been reviewed and is accurate and complete Judi Saa, DO         Note: This dictation was prepared with Dragon dictation along with smaller phrase technology. Any transcriptional errors that result from this process are unintentional.

## 2022-10-08 ENCOUNTER — Ambulatory Visit (INDEPENDENT_AMBULATORY_CARE_PROVIDER_SITE_OTHER): Payer: Medicare Other | Admitting: Family Medicine

## 2022-10-08 ENCOUNTER — Encounter: Payer: Self-pay | Admitting: Family Medicine

## 2022-10-08 VITALS — BP 122/90 | HR 89 | Ht 61.0 in | Wt 150.0 lb

## 2022-10-08 DIAGNOSIS — M9901 Segmental and somatic dysfunction of cervical region: Secondary | ICD-10-CM

## 2022-10-08 DIAGNOSIS — M9902 Segmental and somatic dysfunction of thoracic region: Secondary | ICD-10-CM

## 2022-10-08 DIAGNOSIS — M180 Bilateral primary osteoarthritis of first carpometacarpal joints: Secondary | ICD-10-CM | POA: Diagnosis not present

## 2022-10-08 DIAGNOSIS — M9908 Segmental and somatic dysfunction of rib cage: Secondary | ICD-10-CM | POA: Diagnosis not present

## 2022-10-08 DIAGNOSIS — M2550 Pain in unspecified joint: Secondary | ICD-10-CM | POA: Diagnosis not present

## 2022-10-08 DIAGNOSIS — M1811 Unilateral primary osteoarthritis of first carpometacarpal joint, right hand: Secondary | ICD-10-CM

## 2022-10-08 DIAGNOSIS — M9904 Segmental and somatic dysfunction of sacral region: Secondary | ICD-10-CM

## 2022-10-08 DIAGNOSIS — M9903 Segmental and somatic dysfunction of lumbar region: Secondary | ICD-10-CM | POA: Diagnosis not present

## 2022-10-08 DIAGNOSIS — G5630 Lesion of radial nerve, unspecified upper limb: Secondary | ICD-10-CM

## 2022-10-08 NOTE — Assessment & Plan Note (Signed)
Likely with this and patient's neck pain has responded extremely well though to osteopathic manipulation.  Continuing on the Effexor does help some of the neck discomfort as well.  Discussed icing regimen.  Epidural did help the neck initially but continues to have some discomfort.  Follow-up again in 6 to 8 weeks otherwise.

## 2022-10-08 NOTE — Assessment & Plan Note (Signed)
Patient concerned of the thumb pains.  Has had carpal tunnel on the hand surgery for this.  Discussed potential need for other treatment options.

## 2022-10-08 NOTE — Patient Instructions (Addendum)
Dr. Amanda Pea for finger See you again in 7-8 weeks

## 2022-10-12 ENCOUNTER — Encounter: Payer: Self-pay | Admitting: Family Medicine

## 2022-10-15 ENCOUNTER — Encounter: Payer: Self-pay | Admitting: Emergency Medicine

## 2022-10-15 ENCOUNTER — Other Ambulatory Visit: Payer: Self-pay | Admitting: Emergency Medicine

## 2022-10-15 DIAGNOSIS — I10 Essential (primary) hypertension: Secondary | ICD-10-CM

## 2022-10-15 NOTE — Telephone Encounter (Signed)
There is no higher dose.  It should continue working.  May also want to follow-up with her gynecologist.

## 2022-10-17 ENCOUNTER — Other Ambulatory Visit: Payer: Self-pay | Admitting: Family Medicine

## 2022-10-17 DIAGNOSIS — M5412 Radiculopathy, cervical region: Secondary | ICD-10-CM

## 2022-10-17 NOTE — Telephone Encounter (Signed)
Patient said that Melissa [name] has been approved.  Please send info to insurance company

## 2022-10-18 ENCOUNTER — Encounter: Payer: Self-pay | Admitting: *Deleted

## 2022-10-18 DIAGNOSIS — R61 Generalized hyperhidrosis: Secondary | ICD-10-CM | POA: Diagnosis not present

## 2022-10-22 ENCOUNTER — Encounter (INDEPENDENT_AMBULATORY_CARE_PROVIDER_SITE_OTHER): Payer: Self-pay | Admitting: Otolaryngology

## 2022-10-25 ENCOUNTER — Encounter: Payer: Self-pay | Admitting: Family Medicine

## 2022-10-26 ENCOUNTER — Other Ambulatory Visit (HOSPITAL_COMMUNITY): Payer: Self-pay

## 2022-10-26 ENCOUNTER — Telehealth: Payer: Self-pay

## 2022-10-26 NOTE — Telephone Encounter (Signed)
Pharmacy Patient Advocate Encounter   Received notification from RX Request Messages that prior authorization for Wegovy 0.25mg /0.45ml is required/requested.   Insurance verification completed.   The patient is insured through [facility] [employer] [facility] [employer] .   Per test claim: PA required; PA submitted to [facility] via CoverMyMeds Key/confirmation #/EOC [id number] Status is pending

## 2022-10-29 ENCOUNTER — Encounter: Payer: Self-pay | Admitting: *Deleted

## 2022-10-29 NOTE — Telephone Encounter (Signed)
Pharmacy Patient Advocate Encounter  Received notification from [facility] that Prior Authorization for Wegovy0.25mg /0.79ml has been DENIED.  See denial reason below. No denial letter attached in CMM. Will attache denial letter to Media tab once received.   PA #/Case ID/Reference #: [id number]

## 2022-10-30 DIAGNOSIS — L309 Dermatitis, unspecified: Secondary | ICD-10-CM | POA: Diagnosis not present

## 2022-11-06 ENCOUNTER — Other Ambulatory Visit: Payer: Self-pay

## 2022-11-06 MED ORDER — LEVOTHYROXINE SODIUM 50 MCG PO TABS: 50.0000 ug | ORAL_TABLET | Freq: Every day | ORAL | 0 refills | Status: DC

## 2022-11-07 NOTE — Telephone Encounter (Signed)
Patient would like for you to call her.  [phone]

## 2022-11-08 ENCOUNTER — Telehealth (INDEPENDENT_AMBULATORY_CARE_PROVIDER_SITE_OTHER): Payer: Self-pay | Admitting: Otolaryngology

## 2022-11-08 ENCOUNTER — Encounter: Payer: Self-pay | Admitting: Emergency Medicine

## 2022-11-08 ENCOUNTER — Encounter (INDEPENDENT_AMBULATORY_CARE_PROVIDER_SITE_OTHER): Payer: Self-pay

## 2022-11-08 ENCOUNTER — Telehealth: Payer: Self-pay | Admitting: Emergency Medicine

## 2022-11-08 NOTE — Telephone Encounter (Signed)
Pt called wanting a called back from the nurse about message that was sent.

## 2022-11-08 NOTE — Telephone Encounter (Signed)
Spoke with patient to confirm service location as well as sent mychart message.

## 2022-11-09 ENCOUNTER — Ambulatory Visit (INDEPENDENT_AMBULATORY_CARE_PROVIDER_SITE_OTHER): Payer: Medicare Other | Admitting: Audiology

## 2022-11-09 ENCOUNTER — Ambulatory Visit (INDEPENDENT_AMBULATORY_CARE_PROVIDER_SITE_OTHER): Payer: Medicare Other | Admitting: Otolaryngology

## 2022-11-09 ENCOUNTER — Encounter (INDEPENDENT_AMBULATORY_CARE_PROVIDER_SITE_OTHER): Payer: Self-pay

## 2022-11-09 VITALS — Ht 61.0 in | Wt 150.0 lb

## 2022-11-09 DIAGNOSIS — H938X1 Other specified disorders of right ear: Secondary | ICD-10-CM | POA: Diagnosis not present

## 2022-11-09 DIAGNOSIS — R0981 Nasal congestion: Secondary | ICD-10-CM

## 2022-11-09 DIAGNOSIS — R438 Other disturbances of smell and taste: Secondary | ICD-10-CM | POA: Diagnosis not present

## 2022-11-09 DIAGNOSIS — H93291 Other abnormal auditory perceptions, right ear: Secondary | ICD-10-CM | POA: Diagnosis not present

## 2022-11-09 DIAGNOSIS — R439 Unspecified disturbances of smell and taste: Secondary | ICD-10-CM

## 2022-11-09 DIAGNOSIS — H6991 Unspecified Eustachian tube disorder, right ear: Secondary | ICD-10-CM | POA: Diagnosis not present

## 2022-11-09 DIAGNOSIS — R430 Anosmia: Secondary | ICD-10-CM

## 2022-11-09 DIAGNOSIS — R442 Other hallucinations: Secondary | ICD-10-CM

## 2022-11-09 MED ORDER — OFLOXACIN 0.3 % OP SOLN: 1.0000 [drp] | Freq: Two times a day (BID) | OPHTHALMIC | 0 refills | Status: AC

## 2022-11-09 NOTE — Patient Instructions (Signed)
I have ordered an imaging study for you to complete prior to your next visit. Please call [facility] Scheduling at [phone] to schedule your imaging if you have not received a call within 24 hours. If you are unable to complete your imaging study prior to your next scheduled visit please call our office to let us know.   Use ofloxacin ear drops (4 drops twice per day) for 7 days

## 2022-11-09 NOTE — Progress Notes (Signed)
  [address] [place], [place] [address] [phone]  Audiological Evaluation    Name: Melissa [name]     DOB:   03-25-1975      MRN:   [id number]                                                                                     Service Date: 11/09/2022     Accompanied by: none   Patient comes today after Dr. Allena Katz, ENT sent a referral for a hearing evaluation due to concerns with ear fullness.   Symptoms Yes Details  Hearing loss  []    Tinnitus  []    Ear pain/ Ear infections  [x]  Right ear fullness  Balance problems  []    Noise exposure  []    Previous ear surgeries  []    Family history  []    Amplification  []    Other  []        Tympanometry: Right ear: Type A- Normal external ear canal volume with normal middle ear pressure and tympanic membrane compliance Left ear: Type A- Normal external ear canal volume with normal middle ear pressure and tympanic membrane compliance    Pure tone Audiometry: Right ear- Normal hearing from [phone] Hz.  Left ear-   Normal hearing from [phone] Hz.   The hearing test results were completed under headphones and re-checked with inserts and results are deemed to be of good reliability. Test technique:  conventional     Speech Audiometry: Right ear- Speech Reception Threshold (SRT) was obtained at 5 dBHL Left ear-Speech Reception Threshold (SRT) was obtained at 5 dBHL   Word Recognition Score Tested using NU-6 (MLV) Right ear: 96% was obtained at a presentation level of 55 dBHL with contralateral masking which is deemed as  excellent Left ear: 96% was obtained at a presentation level of 55 dBHL with contralateral masking which is deemed as  excellent    Impression: There is not a significant difference in pure-tone thresholds between ears. There is not a significant difference in the word recognition score in between ears.    Recommendations: Follow up with ENT as scheduled for today. Return for a hearing  evaluation if concerns with hearing changes arise or per MD recommendation.   Phil Corti Melissa [name], AUD

## 2022-11-09 NOTE — Discharge Instructions (Signed)

## 2022-11-09 NOTE — Progress Notes (Unsigned)
Dear Dr. Alvy Bimler, Here is my assessment for our mutual patient, Melissa [name]. Thank you for allowing me the opportunity to care for your patient. Please do not hesitate to contact me should you have any other questions. Sincerely, Dr. Jovita Kussmaul  Otolaryngology Clinic Note Referring provider: Dr. Alvy Bimler HPI:  Melissa [name] is a 47 y.o. female kindly referred by Dr. Alvy Bimler for evaluation of right ear fullness She reports that she gets a lot of wax buildup. Over the last 6 months, she feels like there is something stuck there ("weird sensation")/full. Rare popping/crackling. Does have pressure sensitivity sometimes. She can't pop her ears, no clicking. Her PCP says that there was nothing there. She reports that this did not start since menopause.  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 She reports it maybe happens about once a week. She has had multiple people come in to check the house and they have found nothing. Never happens outside the house. She denies frequent sinonasal infections - pain, pressure, PND, anterior rhinorrhea, dental pain. She does have sensation of congestion, and has to blow her nose frequently and feels like she can't breathe. She has tried ryaltris, and does not work super well. She does use [employer] daily. No prior CT. No neurologic known disease.   Patient denies: ear pain, fullness, vertigo, drainage, tinnitus Patient additionally denies: deep pain in ear canal Patient also denies barotrauma, vestibular suppressant use, ototoxic medication use Prior ear surgery: no No significant occupational noise exposure  PMHx: HTN, Insomnia, Hot Flashes (on [place]), Mood disorder.   H&N Surgery: denies Personal or FHx of bleeding dz or anesthesia difficulty: no   AP/AC: denies  Tobacco: denies. Occupation: homemaker  Independent Review of Additional Tests or Records:  PCP notes  reviewed   PMH/Meds/All/SocHx/FamHx/ROS:   Past Medical History:  Diagnosis Date   Anxiety    Arthritis    Carpal tunnel syndrome    Depression    GERD (gastroesophageal reflux disease)    Hammer toe 04/2013   bilateral 4th and 5th    Ingrown left big toenail 04/2013   Mental disorder    Neuromuscular disorder (HCC)    Post term pregnancy over 40 weeks 09/21/2015   SVD (spontaneous vaginal delivery) 09/22/2015   UTI (lower urinary tract infection)      Past Surgical History:  Procedure Laterality Date   BREAST LIFT Bilateral    CARPAL TUNNEL RELEASE     CORRECTION HAMMER TOE Bilateral    CRYOABLATION  2004   CYSTOSCOPY WITH HYDRODISTENSION AND BIOPSY  07/28/2004   with urethral dilatation   DILATION AND CURETTAGE OF UTERUS     DILATION AND EVACUATION  12/21/2011   Procedure: DILATATION AND EVACUATION;  Surgeon: Ok Edwards, MD;  Location: [facility] ;  Service: Gynecology;  Laterality: N/A;  first trimester    ESOPHAGEAL MANOMETRY N/A 02/10/2021   Procedure: ESOPHAGEAL MANOMETRY (EM);  Surgeon: Napoleon Form, MD;  Location: [facility];  Service: Endoscopy;  Laterality: N/A;   HAMMER TOE SURGERY Bilateral 04/20/2013   Procedure: BILATERAL HAMMER TOE REPAIR AND EXCISION NAIL PERMANENT LEFT FIRST;  Surgeon: Alvan Dame, DPM;  Location: [facility];  Service: Podiatry;  Laterality: Bilateral;  Hammer toe repair fourth and fifth toes bilateral feet with screw placement fourth toes and partial nail excision left great toe   HYSTEROSALPINGOGRAM     MASS EXCISION Left  03/27/2022   Procedure: EXCISION SOFT TISSUE MASS x2, LEFT UPPER ARM;  Surgeon: Darnell Level, MD;  Location[facility];  Service: General;  Laterality: Left;  LMA   REDUCTION MAMMAPLASTY Bilateral 2020   TOE SURGERY     ingrown toenail   WISDOM TOOTH EXTRACTION      Family History  Problem Relation Age of Onset   Asthma Mother    Seizures Mother     Arthritis Mother    Lung cancer Father    Thyroid disease Maternal Grandmother    Heart disease Paternal Grandmother        2 CABG   Lung cancer Paternal Grandfather      Social Connections: Unknown (10/11/2021)   Social Connection and Isolation Panel [NHANES]    Frequency of Communication with Friends and Family: More than three times a week    Frequency of Social Gatherings with Friends and Family: More than three times a week    Attends Religious Services: More than 4 times per year    Active Member of Clubs or Organizations: No    Attends Banker Meetings: Never    Marital Status: Patient declined      Current Outpatient Medications:    amphetamine-dextroamphetamine (ADDERALL) 20 MG tablet, Take 20 mg by mouth 3 (three) times daily., Disp: , Rfl:    buPROPion (WELLBUTRIN XL) 150 MG 24 hr tablet, Take 450 mg by mouth daily., Disp: , Rfl:    DULoxetine (CYMBALTA) 60 MG capsule, Take 60 mg by mouth daily., Disp: , Rfl:    hydrOXYzine (ATARAX) 25 MG tablet, Take 1 tablet (25 mg total) by mouth at bedtime as needed., Disp: 30 tablet, Rfl: 1   ibuprofen (ADVIL) 800 MG tablet, Take 1 tablet (800 mg total) by mouth every 8 (eight) hours as needed (pain)., Disp: 21 tablet, Rfl: 0   levothyroxine (SYNTHROID) 50 MCG tablet, Take 1 tablet (50 mcg total) by mouth daily before breakfast., Disp: 90 tablet, Rfl: 0   Olopatadine HCl (PATADAY) 0.2 % SOLN, Place 1 drop into both eyes daily as needed., Disp: 2.5 mL, Rfl: 2   ondansetron (ZOFRAN) 4 MG tablet, Take 1 tablet (4 mg total) by mouth every 8 (eight) hours as needed., Disp: 20 tablet, Rfl: 0   PARoxetine (PAXIL) 20 MG tablet, Take 20 mg by mouth daily., Disp: , Rfl:    RYALTRIS 665-25 MCG/ACT SUSP, Place 2 sprays into the nose in the morning and at bedtime., Disp: 29 g, Rfl: 10   Salicylic Acid 3 % SHAM, Use daily as directed for dry scalp, Disp: 236 mL, Rfl: 1   selenium sulfide (SELSUN) 2.5 % shampoo, USE LOTION EXTERNALLY  TO  AFFECTED AREA ONCE DAILY AS NEEDED IRRITATION, Disp: 120 mL, Rfl: 0   tirzepatide (ZEPBOUND) 2.5 MG/0.5ML injection vial, Inject 2.5 mg into the skin once a week., Disp: 2 mL, Rfl: 3   traMADol (ULTRAM) 50 MG tablet, Take 1 tablet (50 mg total) by mouth every 6 (six) hours as needed for moderate pain., Disp: 15 tablet, Rfl: 0   venlafaxine XR (EFFEXOR-XR) 37.5 MG 24 hr capsule, TAKE 1 CAPSULE BY MOUTH DAILY WITH BREAKFAST., Disp: 30 capsule, Rfl: 0   VEOZAH 45 MG TABS, TAKE 1 TABLET BY MOUTH EVERY DAY, Disp: 30 tablet, Rfl: 1   XIIDRA 5 % SOLN, INSTILL 1 DROP TWICE DAILY INTO EACH EYE, Disp: , Rfl:    Physical Exam:   There were no vitals taken for this visit.  Salient findings:  CN II-XII intact *** Bilateral EAC clear and TM intact with well pneumatized middle ear spaces Weber 512: *** Rinne 512: AC > BC b/l *** Rine 1024: AC > BC b/l *** Anterior rhinoscopy: Septum ***; bilateral inferior turbinates with *** No lesions of oral cavity/oropharynx; dentition *** No obviously palpable neck masses/lymphadenopathy/thyromegaly No respiratory distress or stridor***  Seprately Identifiable Procedures:  None***  Impression & Plans:  Rhaelyn Hambleton is a 47 y.o. female with ***   - f/u ***  See below regarding exact medications prescribed this encounter including dosages and route: No orders of the defined types were placed in this encounter.     Thank you for allowing me the opportunity to care for your patient. Please do not hesitate to contact me should you have any other questions.  Sincerely, Jovita Kussmaul, MD Otolarynoglogist (ENT), [facility] Phone: [phone] Fax: [phone]  11/09/2022, 12:16 PM   MDM:  Level *** Complexity/Problems addressed: *** Data complexity: *** independent review of *** - Morbidity: ***  - Prescription Drug prescribed or managed: ***

## 2022-11-09 NOTE — Telephone Encounter (Signed)
Patient called office and and Receptionist attempted to talk to patient. She states she wanted to be called back. She was at an appointment.

## 2022-11-11 NOTE — Telephone Encounter (Signed)
Melissa [name] needs to understand she is not a diabetic.Rybelsus is only indicated for diabetes.Zepbound on the other hand is indicated for weight loss just like Wegovy .Bottom line is she cannot get a prescription for Rybelsus.

## 2022-11-12 ENCOUNTER — Encounter: Payer: Self-pay | Admitting: Emergency Medicine

## 2022-11-12 ENCOUNTER — Telehealth: Payer: Self-pay | Admitting: Emergency Medicine

## 2022-11-12 ENCOUNTER — Inpatient Hospital Stay
Admission: RE | Admit: 2022-11-12 | Discharge: 2022-11-12 | Disposition: A | Discharge status: Home / Self Care | Payer: Medicare Other | Source: Ambulatory Visit | Attending: Family Medicine | Admitting: Family Medicine

## 2022-11-12 NOTE — Telephone Encounter (Signed)
Patient called and said she was corresponding with Roe Coombs via MyChart. She said she thinks there is some misunderstanding/miscommunication. She would like a phone call to discuss. Patient said she would like a call back at [phone].

## 2022-11-12 NOTE — Telephone Encounter (Signed)
Pt called back. °

## 2022-11-13 NOTE — Telephone Encounter (Signed)
Called patient about current issue. She has an appointment to see Dr. Alvy Bimler tomorrow

## 2022-11-14 ENCOUNTER — Encounter: Payer: Self-pay | Admitting: Emergency Medicine

## 2022-11-14 ENCOUNTER — Telehealth (INDEPENDENT_AMBULATORY_CARE_PROVIDER_SITE_OTHER): Payer: Medicare Other | Admitting: Emergency Medicine

## 2022-11-14 ENCOUNTER — Telehealth: Payer: Self-pay | Admitting: Radiology

## 2022-11-14 DIAGNOSIS — Z6830 Body mass index (BMI) 30.0-30.9, adult: Secondary | ICD-10-CM | POA: Diagnosis not present

## 2022-11-14 DIAGNOSIS — E66811 Obesity, class 1: Secondary | ICD-10-CM

## 2022-11-14 DIAGNOSIS — E6609 Other obesity due to excess calories: Secondary | ICD-10-CM | POA: Diagnosis not present

## 2022-11-14 MED ORDER — RYBELSUS 7 MG PO TABS: 7.0000 mg | ORAL_TABLET | Freq: Every day | ORAL | 3 refills | Status: DC

## 2022-11-14 NOTE — Progress Notes (Signed)
Telemedicine Encounter- SOAP NOTE Established Patient MyChart video encounter Patient: Home  Provider: Office   Patient present only  This video encounter was conducted with the patient's (or proxy's) verbal consent via video telecommunications: yes/no: Yes Patient was instructed to have this encounter in a suitably private space; and to only have persons present to whom they give permission to participate. In addition, patient identity was confirmed by use of name plus two identifiers (DOB and address).  Chief complaint: Medication concern Subjective  Melissa [name] is a 47 y.o. female established patient. Telephone visit today to go over medications. Concerned about Rybelsus.  Patient was using it for weight loss.  States it was initially approved by insurance but now he needs another prior authorization. Patient does not have history of diabetes. Has no other complaints or medical concerns today.  HPI ? Patient Active Problem List   Diagnosis Date Noted   Arthritis of carpometacarpal [facility]) joint of both thumbs 10/08/2022   Chronic insomnia 10/04/2022   Sensation of fullness in ear, right 10/04/2022   Smell or taste problem 10/04/2022   Early menopause 05/21/2022   Vasomotor symptoms due to menopause 10/16/2021   Essential hypertension 06/28/2021   Chronic pain of both knees 06/28/2021   Weight gain 02/08/2021   Family history of thyroid disease 02/08/2021   Gastroesophageal reflux disease 11/29/2020   History of Helicobacter pylori infection 11/29/2020   Nonallopathic lesion of lumbosacral region 03/01/2019   Nonallopathic lesion of cervical region 03/01/2019   Nonallopathic lesion of sacral region 03/01/2019   Nonallopathic lesion of thoracic region 03/01/2019   Nonallopathic lesion of rib cage 03/01/2019   Vitamin D deficiency 05/08/2018   Piriformis syndrome, left 02/25/2017   Mixed stress and urge urinary incontinence 02/07/2016   Carpal tunnel syndrome 01/03/2016    Polyarthralgia 01/03/2016   Radial tunnel syndrome 06/17/2014   Cervical radiculopathy 06/10/2014   Bipolar depression (HCC) 05/17/2014   Chronic night sweats 06/26/2013   Obesity without serious comorbidity 01/25/2012   Past Medical History:  Diagnosis Date   Anxiety    Arthritis    Carpal tunnel syndrome    Depression    GERD (gastroesophageal reflux disease)    Hammer toe 04/2013   bilateral 4th and 5th    Ingrown left big toenail 04/2013   Mental disorder    Neuromuscular disorder (HCC)    Post term pregnancy over 40 weeks 09/21/2015   SVD (spontaneous vaginal delivery) 09/22/2015   UTI (lower urinary tract infection)    Current Outpatient Medications  Medication Sig Dispense Refill   Semaglutide (RYBELSUS) 7 MG TABS Take 1 tablet (7 mg total) by mouth daily. 30 tablet 3   amphetamine-dextroamphetamine (ADDERALL) 20 MG tablet Take 20 mg by mouth 3 (three) times daily.     buPROPion (WELLBUTRIN XL) 150 MG 24 hr tablet Take 450 mg by mouth daily.     DULoxetine (CYMBALTA) 60 MG capsule Take 60 mg by mouth daily.     hydrOXYzine (ATARAX) 25 MG tablet Take 1 tablet (25 mg total) by mouth at bedtime as needed. 30 tablet 1   ibuprofen (ADVIL) 800 MG tablet Take 1 tablet (800 mg total) by mouth every 8 (eight) hours as needed (pain). 21 tablet 0   levothyroxine (SYNTHROID) 50 MCG tablet Take 1 tablet (50 mcg total) by mouth daily before breakfast. 90 tablet 0   ofloxacin (OCUFLOX) 0.3 % ophthalmic solution Place 1 drop into the right ear in the morning and at bedtime for 7  days. 5 mL 0   Olopatadine HCl (PATADAY) 0.2 % SOLN Place 1 drop into both eyes daily as needed. 2.5 mL 2   ondansetron (ZOFRAN) 4 MG tablet Take 1 tablet (4 mg total) by mouth every 8 (eight) hours as needed. (Patient not taking: Reported on 11/09/2022) 20 tablet 0   PARoxetine (PAXIL) 20 MG tablet Take 20 mg by mouth daily.     RYALTRIS [id number] MCG/ACT SUSP Place 2 sprays into the nose in the morning and at bedtime. 29  g 10   Salicylic Acid 3 % SHAM Use daily as directed for dry scalp 236 mL 1   selenium sulfide (SELSUN) 2.5 % shampoo USE LOTION EXTERNALLY  TO AFFECTED AREA ONCE DAILY AS NEEDED IRRITATION 120 mL 0   traMADol (ULTRAM) 50 MG tablet Take 1 tablet (50 mg total) by mouth every 6 (six) hours as needed for moderate pain. 15 tablet 0   venlafaxine XR (EFFEXOR-XR) 37.5 MG 24 hr capsule TAKE 1 CAPSULE BY MOUTH DAILY WITH BREAKFAST. 30 capsule 0   VEOZAH 45 MG TABS TAKE 1 TABLET BY MOUTH EVERY DAY 30 tablet 1   XIIDRA 5 % SOLN INSTILL 1 DROP TWICE DAILY INTO EACH EYE     No current facility-administered medications for this visit.   Allergies  Allergen Reactions   Bactrim [Sulfamethoxazole-Trimethoprim]    Social History   Socioeconomic History   Marital status: Single    Spouse name: Not on file   Number of children: 1   Years of education: Not on file   Highest education level: Not on file  Occupational History   Occupation: Homemaker  Tobacco Use   Smoking status: Never    Passive exposure: Never   Smokeless tobacco: Never  Vaping Use   Vaping status: Never Used  Substance and Sexual Activity   Alcohol use: Yes    Comment: occasionally   Drug use: No   Sexual activity: Yes    Birth control/protection: I.U.D.  Other Topics Concern   Not on file  Social History Narrative   Not on file   Social Determinants of Health   Financial Resource Strain: Low Risk  (10/11/2021)   Overall Financial Resource Strain (CARDIA)    Difficulty of Paying Living Expenses: Not hard at all  Food Insecurity: No Food Insecurity (10/11/2021)   Hunger Vital Sign    Worried About Running Out of Food in the Last Year: Never true    Ran Out of Food in the Last Year: Never true  Transportation Needs: No Transportation Needs (10/11/2021)   PRAPARE - Administrator, Civil Service (Medical): No    Lack of Transportation (Non-Medical): No  Physical Activity: Sufficiently Active (10/11/2021)    Exercise Vital Sign    Days of Exercise per Week: 3 days    Minutes of Exercise per Session: 60 min  Stress: No Stress Concern Present (10/11/2021)   [employer] of Occupational Health - Occupational Stress Questionnaire    Feeling of Stress : Not at all  Social Connections: Unknown (10/11/2021)   Social Connection and Isolation Panel [NHANES]    Frequency of Communication with Friends and Family: More than three times a week    Frequency of Social Gatherings with Friends and Family: More than three times a week    Attends Religious Services: More than 4 times per year    Active Member of [employer] or Organizations: No    Attends Banker Meetings: Never  Marital Status: Patient declined  Intimate Partner Violence: Not At Risk (10/11/2021)   Humiliation, Afraid, Rape, and Kick questionnaire    Fear of Current or Ex-Partner: No    Emotionally Abused: No    Physically Abused: No    Sexually Abused: No   Review of Systems  Constitutional: Negative.  Negative for chills and fever.  HENT: Negative.  Negative for congestion and sore throat.   Respiratory: Negative.  Negative for cough and shortness of breath.   Cardiovascular: Negative.  Negative for chest pain and palpitations.  Gastrointestinal:  Negative for abdominal pain, diarrhea, nausea and vomiting.  Skin: Negative.  Negative for rash.  Neurological: Negative.  Negative for dizziness and headaches.  All other systems reviewed and are negative.  Objective  Vitals as reported by the patient: There were no vitals filed for this visit. Problem List Items Addressed This Visit       Other   Obesity without serious comorbidity - Primary    Diet and nutrition discussed Benefits of exercise discussed Advised to decrease amount of daily carbohydrate intake and daily calories and increase amount of plant-based protein in her diet Wants to continue Rybelsus 7 mg daily New prescription sent today to pharmacy of record.   Will need prior authorization.      Relevant Medications   Semaglutide (RYBELSUS) 7 MG TABS     I discussed the assessment and treatment plan with the patient. The patient was provided an opportunity to ask questions and all were answered. The patient agreed with the plan and demonstrated an understanding of the instructions.   The patient was advised to call back or seek an in-person evaluation if the symptoms worsen or if the condition fails to improve as anticipated.  Dr. Edwina Barth Primary care [facility]

## 2022-11-14 NOTE — Assessment & Plan Note (Signed)
Diet and nutrition discussed Benefits of exercise discussed Advised to decrease amount of daily carbohydrate intake and daily calories and increase amount of plant-based protein in her diet Wants to continue Rybelsus 7 mg daily New prescription sent today to pharmacy of record.  Will need prior authorization.

## 2022-11-14 NOTE — Telephone Encounter (Signed)
PA for Rybelsus 7mg 

## 2022-11-15 ENCOUNTER — Other Ambulatory Visit (HOSPITAL_COMMUNITY): Payer: Self-pay

## 2022-11-15 ENCOUNTER — Telehealth: Payer: Self-pay

## 2022-11-15 ENCOUNTER — Encounter: Payer: Self-pay | Admitting: *Deleted

## 2022-11-15 NOTE — Telephone Encounter (Signed)
Pharmacy Patient Advocate Encounter  Received notification from [facility] that Prior Authorization for Rybelsus 7mg  has been DENIED.  Full denial letter will be uploaded to the media tab. See denial reason below.   PA #/Case ID/Reference #: [id number]

## 2022-11-15 NOTE — Telephone Encounter (Signed)
Pharmacy Patient Advocate Encounter   Received notification from Pt Calls Messages that prior authorization for Rybelsus 7mg  tabs is required/requested.   Insurance verification completed.   The patient is insured through [facility] .   Per test claim: PA required; PA submitted to above mentioned insurance via CoverMyMeds Key/confirmation #/EOC [facility] Iv Status is pending

## 2022-11-19 ENCOUNTER — Encounter: Payer: Self-pay | Admitting: Emergency Medicine

## 2022-11-19 NOTE — Telephone Encounter (Signed)
Just provide the documentation needed.  I think this is going to PA department

## 2022-11-19 NOTE — Discharge Instructions (Signed)

## 2022-11-20 ENCOUNTER — Other Ambulatory Visit (HOSPITAL_COMMUNITY): Payer: Self-pay

## 2022-11-20 ENCOUNTER — Ambulatory Visit
Admission: RE | Admit: 2022-11-20 | Discharge: 2022-11-20 | Disposition: A | Discharge status: Home / Self Care | Payer: Medicare Other | Source: Ambulatory Visit | Attending: Family Medicine | Admitting: Family Medicine

## 2022-11-20 DIAGNOSIS — M50122 Cervical disc disorder at C5-C6 level with radiculopathy: Secondary | ICD-10-CM | POA: Diagnosis not present

## 2022-11-20 DIAGNOSIS — M5412 Radiculopathy, cervical region: Secondary | ICD-10-CM

## 2022-11-20 MED ORDER — TRIAMCINOLONE ACETONIDE 40 MG/ML IJ SUSP (RADIOLOGY)
60.0000 mg | Freq: Once | INTRAMUSCULAR | Status: AC
  Administered 2022-11-20: 60 mg via EPIDURAL

## 2022-11-20 MED ORDER — IOPAMIDOL (ISOVUE-M 300) INJECTION 61%
1.0000 mL | Freq: Once | INTRAMUSCULAR | Status: AC | PRN
  Administered 2022-11-20: 1 mL via EPIDURAL

## 2022-11-20 NOTE — Telephone Encounter (Signed)
Ms. Broeker lacks a diagnosis of Type 2 diabetes therefore she does not meet the criteria for Rybelsus therapy. It will not be covered by her insurance. Additionally, Reginal Lutes is only covered under Medicare for patients with established cardiovascular disease (meaning they have had a prior heart attack, prior stroke, or peripheral arterial disease) and either obesity or overweight. A review of Ms. Tuckett's chart confirms she does not meet these qualifications for GLP-1 therapy. All options for this drug class have been exhausted, no additional PA's or appeals can be submitted at this time.  Thank you, Dellie Burns, PharmD Clinical Pharmacist [place]  Direct Dial: [phone]

## 2022-11-20 NOTE — Telephone Encounter (Signed)
Thank you.  Make sure Ms. Swatzell is aware of this.  Not much else we can do.

## 2022-11-22 ENCOUNTER — Other Ambulatory Visit: Payer: Self-pay | Admitting: Emergency Medicine

## 2022-11-22 DIAGNOSIS — I10 Essential (primary) hypertension: Secondary | ICD-10-CM

## 2022-11-22 DIAGNOSIS — N951 Menopausal and female climacteric states: Secondary | ICD-10-CM

## 2022-11-22 NOTE — Progress Notes (Signed)
Melissa [name] [address] [place] [address] Phone: [phone] Subjective:   Melissa [name], am serving as a scribe for Dr. Antoine Primas.  I'm seeing this patient by the request  of:  Melissa Quint, MD  CC: Bilateral thumb pain, neck pain.  [id number]  Melissa [name] is a 47 y.o. female coming in with complaint of back and neck pain. OMT 10/08/2022. Also pain in B thumb pain. Patient states same per usual. No new concerns.  Medications patient has been prescribed: Synthroid, Effexor  Taking:      Reviewing patient's chart last vitamin D was checked 6 months ago and was still low at 24.   Reviewed prior external information including notes and imaging from previsou exam, outside providers and external EMR if available.   As well as notes that were available from care everywhere and other healthcare systems.  Past medical history, social, surgical and family history all reviewed in electronic medical record.  No pertanent information unless stated regarding to the chief complaint.   Past Medical History:  Diagnosis Date   Anxiety    Arthritis    Carpal tunnel syndrome    Depression    GERD (gastroesophageal reflux disease)    Hammer toe 04/2013   bilateral 4th and 5th    Ingrown left big toenail 04/2013   Mental disorder    Neuromuscular disorder (HCC)    Post term pregnancy over 40 weeks 09/21/2015   SVD (spontaneous vaginal delivery) 09/22/2015   UTI (lower urinary tract infection)     Allergies  Allergen Reactions   Bactrim [Sulfamethoxazole-Trimethoprim]      Review of Systems:  No headache, visual changes, nausea, vomiting, diarrhea, constipation, dizziness, abdominal pain, skin rash, fevers, chills, night sweats, weight loss, swollen lymph nodes, body aches, joint swelling, chest pain, shortness of breath, mood changes. POSITIVE muscle aches  Objective  Blood pressure (!) 132/98, pulse 94, height 5\' 1"  (1.549  m), weight 164 lb (74.4 kg), SpO2 98%.   General: No apparent distress alert and oriented x3 mood and affect normal, dressed appropriately.  HEENT: Pupils equal, extraocular movements intact  Respiratory: Patient's speak in full sentences and does not appear short of breath  Cardiovascular: No lower extremity edema, non tender, no erythema  Gait normal  MSK:  Back does have some loss of lordosis noted.  Patient does have some CMC arthritic changes noted.  Neck exam does have some limited sidebending bilaterally.  Pain does have some improvement throughout the entire paraspinal musculature.  Osteopathic findings  C2 flexed rotated and side bent right C5 flexed rotated and side bent right T3 extended rotated and side bent right inhaled rib T5 extended rotated and side bent left L2 flexed rotated and side bent right Sacrum right on right     Assessment and Plan:  Arthritis of carpometacarpal (CMC) joint of both thumbs Has known arthritic changes.  Has seen a provider previously seen entirely sure of the plan and would like a second opinion.  Will refer patient to surgeon to discuss further.  Discussed icing regimen and home exercises otherwise.    Nonallopathic problems  Decision today to treat with OMT was based on Physical Exam  After verbal consent patient was treated with HVLA, ME, FPR techniques in cervical, rib, thoracic, lumbar, and sacral  areas  Patient tolerated the procedure well with improvement in symptoms  Patient given exercises, stretches and lifestyle modifications  See medications in patient instructions if  given  Patient will follow up in 4-8 weeks    The above documentation has been reviewed and is accurate and complete Melissa Saa, DO          Note: This dictation was prepared with Dragon dictation along with smaller phrase technology. Any transcriptional errors that result from this process are unintentional.

## 2022-11-23 ENCOUNTER — Encounter: Payer: Self-pay | Admitting: Radiology

## 2022-11-26 DIAGNOSIS — M79644 Pain in right finger(s): Secondary | ICD-10-CM | POA: Diagnosis not present

## 2022-11-26 NOTE — Telephone Encounter (Signed)
Spoke to patient and made her aware of the denial, she was frustrated and is going to call the pharmacy and her insurance again.

## 2022-12-03 ENCOUNTER — Ambulatory Visit (INDEPENDENT_AMBULATORY_CARE_PROVIDER_SITE_OTHER): Payer: Medicare Other | Admitting: Family Medicine

## 2022-12-03 ENCOUNTER — Encounter: Payer: Self-pay | Admitting: Family Medicine

## 2022-12-03 VITALS — BP 132/98 | HR 94 | Ht 61.0 in | Wt 164.0 lb

## 2022-12-03 DIAGNOSIS — M9902 Segmental and somatic dysfunction of thoracic region: Secondary | ICD-10-CM

## 2022-12-03 DIAGNOSIS — M9908 Segmental and somatic dysfunction of rib cage: Secondary | ICD-10-CM

## 2022-12-03 DIAGNOSIS — M9901 Segmental and somatic dysfunction of cervical region: Secondary | ICD-10-CM | POA: Diagnosis not present

## 2022-12-03 DIAGNOSIS — M9904 Segmental and somatic dysfunction of sacral region: Secondary | ICD-10-CM

## 2022-12-03 DIAGNOSIS — M9903 Segmental and somatic dysfunction of lumbar region: Secondary | ICD-10-CM | POA: Diagnosis not present

## 2022-12-03 DIAGNOSIS — M180 Bilateral primary osteoarthritis of first carpometacarpal joints: Secondary | ICD-10-CM | POA: Diagnosis not present

## 2022-12-03 MED ORDER — KETOROLAC TROMETHAMINE 30 MG/ML IJ SOLN
30.0000 mg | Freq: Once | INTRAMUSCULAR | Status: AC
  Administered 2022-12-03: 30 mg via INTRAMUSCULAR

## 2022-12-03 MED ORDER — METHYLPREDNISOLONE ACETATE 40 MG/ML IJ SUSP
40.0000 mg | Freq: Once | INTRAMUSCULAR | Status: AC
  Administered 2022-12-03: 40 mg via INTRAMUSCULAR

## 2022-12-03 NOTE — Assessment & Plan Note (Signed)
Has known arthritic changes.  Has seen a provider previously seen entirely sure of the plan and would like a second opinion.  Will refer patient to surgeon to discuss further.  Discussed icing regimen and home exercises otherwise.

## 2022-12-03 NOTE — Addendum Note (Signed)
Addended by: Debbe Odea R on: 12/03/2022 11:01 AM   Modules accepted: Orders

## 2022-12-03 NOTE — Patient Instructions (Addendum)
Good to see you! Cocktail injection today See you again in 6-8 weeks Dr. Janee Morn referral for thumb pain

## 2022-12-04 NOTE — Telephone Encounter (Signed)
[employer] called regarding the patient's prior authorization. They said the need more information regarding why the patient needs the medication. They provided two numbers that can be called to expedite the process: [phone] and [phone].

## 2022-12-07 NOTE — Telephone Encounter (Signed)
Tried calling both numbers and state call back during work hours. Will try again this afternoon

## 2022-12-19 ENCOUNTER — Other Ambulatory Visit: Payer: Self-pay | Admitting: Emergency Medicine

## 2022-12-19 DIAGNOSIS — F5104 Psychophysiologic insomnia: Secondary | ICD-10-CM

## 2022-12-29 ENCOUNTER — Inpatient Hospital Stay: Admission: RE | Admit: 2022-12-29 | Payer: Medicare Other | Source: Ambulatory Visit

## 2023-01-01 ENCOUNTER — Telehealth (INDEPENDENT_AMBULATORY_CARE_PROVIDER_SITE_OTHER): Payer: Self-pay

## 2023-01-01 NOTE — Telephone Encounter (Signed)
Left vm regarding her message left on messaging machine. Pt is having thick muscus in throat and congestion in nose. Per Dr. Allena Katz start afrin for 3 days and flonase. Saline nasal rinse. Then schedule MRI.

## 2023-01-04 ENCOUNTER — Telehealth: Payer: Self-pay

## 2023-01-04 NOTE — Telephone Encounter (Signed)
 Copied from CRM [id number]. Topic: Clinical - Medication Question >> Jan 04, 2023  3:56 PM Franky GRADE wrote: Reason for CRM: Deward from [employer] is calling regarding the prior authorization for Semaglutide  (RYBELSUS ) 7 MG TABS. Patient called them and stated Dr.Sagardia provided her with a PA;however, they do not see anything on file. Their call back number is [phone] Ref# [id number]

## 2023-01-07 NOTE — Telephone Encounter (Signed)
 Spoke with PA team and new PA was placed. Rep states it will take 72 hours to process   Ref number: [id number]

## 2023-01-08 ENCOUNTER — Encounter: Payer: Self-pay | Admitting: Emergency Medicine

## 2023-01-08 NOTE — Telephone Encounter (Signed)
 An appeal was done VERBALLY for prevented heart conditions. Supporting Clinical notes will be faxed

## 2023-01-11 ENCOUNTER — Ambulatory Visit (HOSPITAL_COMMUNITY): Admission: RE | Admit: 2023-01-11 | Payer: Medicare Other | Source: Ambulatory Visit

## 2023-01-15 ENCOUNTER — Ambulatory Visit: Payer: Medicare Other | Admitting: Emergency Medicine

## 2023-01-17 ENCOUNTER — Ambulatory Visit (HOSPITAL_COMMUNITY)
Admission: RE | Admit: 2023-01-17 | Discharge: 2023-01-17 | Disposition: A | Discharge status: Home / Self Care | Payer: Medicare Other | Source: Ambulatory Visit | Attending: Otolaryngology | Admitting: Otolaryngology

## 2023-01-17 DIAGNOSIS — R442 Other hallucinations: Secondary | ICD-10-CM | POA: Diagnosis present

## 2023-01-17 DIAGNOSIS — R439 Unspecified disturbances of smell and taste: Secondary | ICD-10-CM | POA: Diagnosis not present

## 2023-01-17 DIAGNOSIS — R930 Abnormal findings on diagnostic imaging of skull and head, not elsewhere classified: Secondary | ICD-10-CM | POA: Diagnosis not present

## 2023-01-17 DIAGNOSIS — R430 Anosmia: Secondary | ICD-10-CM | POA: Insufficient documentation

## 2023-01-17 MED ORDER — GADOBUTROL 1 MMOL/ML IV SOLN
7.0000 mL | Freq: Once | INTRAVENOUS | Status: AC | PRN
  Administered 2023-01-17: 7 mL via INTRAVENOUS

## 2023-01-18 ENCOUNTER — Ambulatory Visit
Admission: EM | Admit: 2023-01-18 | Discharge: 2023-01-18 | Disposition: A | Discharge status: Home / Self Care | Payer: Medicare Other

## 2023-01-18 ENCOUNTER — Ambulatory Visit (INDEPENDENT_AMBULATORY_CARE_PROVIDER_SITE_OTHER): Payer: Medicare Other

## 2023-01-18 DIAGNOSIS — R0982 Postnasal drip: Secondary | ICD-10-CM | POA: Diagnosis not present

## 2023-01-18 DIAGNOSIS — R140 Abdominal distension (gaseous): Secondary | ICD-10-CM | POA: Diagnosis not present

## 2023-01-18 DIAGNOSIS — K5900 Constipation, unspecified: Secondary | ICD-10-CM | POA: Diagnosis not present

## 2023-01-18 MED ORDER — FEXOFENADINE HCL 180 MG PO TABS: 180.0000 mg | ORAL_TABLET | Freq: Every day | ORAL | 0 refills | Status: AC

## 2023-01-18 MED ORDER — PREDNISONE 20 MG PO TABS: 20.0000 mg | ORAL_TABLET | Freq: Every day | ORAL | 0 refills | Status: DC

## 2023-01-18 NOTE — Discharge Instructions (Addendum)
Follow up with your PCP in one week or as scheduled on 1/27 if your symptoms are not gone, you may need a thyroid ultrasound or see ENT  Get magnesium citrate bottle and drink the whole thing to empty our colon You may take GasX or drink Anis tea as needed for bloating.  Try to avoid gluten which can contribute to bloating and constipation.

## 2023-01-18 NOTE — ED Provider Notes (Signed)
Melissa [name]    CSN: [id number] Arrival date & time: 01/18/23  0801      History   Chief Complaint Chief Complaint  Patient presents with   Nasal Congestion   Bloated    HPI Melissa [name] is a 48 y.o. female who presents with a couple a problems 1- post nasal drainage which gets worse at bedtime. Feels something is stuck in her lower throat, and tries to clear her throat but can't spit anything out.  Has been having trouble sleeping due to having the sensation she has drainage.   Denies fever, cough, myalgias. Has appt with PCP 1/27 ,but can't wait til then. She denies getting anything stuck in her throat.   2- Bloating for several weeks but denies constipation, but denies abdominal pain. Has tried different things but is not helping  Past Medical History:  Diagnosis Date   Anxiety    Arthritis    Carpal tunnel syndrome    Depression    GERD (gastroesophageal reflux disease)    Hammer toe 04/2013   bilateral 4th and 5th    Ingrown left big toenail 04/2013   Mental disorder    Neuromuscular disorder (HCC)    Post term pregnancy over 40 weeks 09/21/2015   SVD (spontaneous vaginal delivery) 09/22/2015   UTI (lower urinary tract infection)     Patient Active Problem List   Diagnosis Date Noted   Arthritis of carpometacarpal (CMC) joint of both thumbs 10/08/2022   Chronic insomnia 10/04/2022   Sensation of fullness in ear, right 10/04/2022   Smell or taste problem 10/04/2022   Early menopause 05/21/2022   Vasomotor symptoms due to menopause 10/16/2021   Essential hypertension 06/28/2021   Chronic pain of both knees 06/28/2021   Weight gain 02/08/2021   Family history of thyroid disease 02/08/2021   Gastroesophageal reflux disease 11/29/2020   History of Helicobacter pylori infection 11/29/2020   Nonallopathic lesion of lumbosacral region 03/01/2019   Nonallopathic lesion of cervical region 03/01/2019   Nonallopathic lesion of sacral region 03/01/2019    Nonallopathic lesion of thoracic region 03/01/2019   Nonallopathic lesion of rib cage 03/01/2019   Vitamin D deficiency 05/08/2018   Piriformis syndrome, left 02/25/2017   Mixed stress and urge urinary incontinence 02/07/2016   Carpal tunnel syndrome 01/03/2016   Polyarthralgia 01/03/2016   Radial tunnel syndrome 06/17/2014   Cervical radiculopathy 06/10/2014   Bipolar depression (HCC) 05/17/2014   Chronic night sweats 06/26/2013   Obesity without serious comorbidity 01/25/2012    Past Surgical History:  Procedure Laterality Date   BREAST LIFT Bilateral    CARPAL TUNNEL RELEASE     CORRECTION HAMMER TOE Bilateral    CRYOABLATION  2004   CYSTOSCOPY WITH HYDRODISTENSION AND BIOPSY  07/28/2004   with urethral dilatation   DILATION AND CURETTAGE OF UTERUS     DILATION AND EVACUATION  12/21/2011   Procedure: DILATATION AND EVACUATION;  Surgeon: Ok Edwards, MD;  Location: [facility];  Service: Gynecology;  Laterality: N/A;  first trimester    ESOPHAGEAL MANOMETRY N/A 02/10/2021   Procedure: ESOPHAGEAL MANOMETRY (EM);  Surgeon: Napoleon Form, MD;  Location: [facility];  Service: Endoscopy;  Laterality: N/A;   HAMMER TOE SURGERY Bilateral 04/20/2013   Procedure: BILATERAL HAMMER TOE REPAIR AND EXCISION NAIL PERMANENT LEFT FIRST;  Surgeon: Alvan Dame, DPM;  Location: [facility];  Service: Podiatry;  Laterality: Bilateral;  Hammer toe repair fourth and fifth toes bilateral feet with screw  placement fourth toes and partial nail excision left great toe   HYSTEROSALPINGOGRAM     MASS EXCISION Left 03/27/2022   Procedure: EXCISION SOFT TISSUE MASS x2, LEFT UPPER ARM;  Surgeon: Darnell Level, MD;  Location: [facility];  Service: General;  Laterality: Left;  LMA   REDUCTION MAMMAPLASTY Bilateral 2020   TOE SURGERY     ingrown toenail   WISDOM TOOTH EXTRACTION      OB History     Gravida  6   Para  1   Term  1   Preterm  0    AB  5   Living  1      SAB  2   IAB  1   Ectopic  2   Multiple  0   Live Births  1            Home Medications    Prior to Admission medications   Medication Sig Start Date End Date Taking? Authorizing Provider  buPROPion (WELLBUTRIN XL) 300 MG 24 hr tablet Take 300 mg by mouth every morning. 11/08/22  Yes [provider]  estradiol (VIVELLE-DOT) 0.1 MG/24HR patch Place 1 patch onto the skin 2 (two) times a week. 12/06/22  Yes [provider]  fexofenadine (ALLEGRA ALLERGY) 180 MG tablet Take 1 tablet (180 mg total) by mouth daily. At bedtime for drainage 01/18/23  Yes Lundblad-Southworth, Nettie Elm, PA-C  levonorgestrel (MIRENA) 20 MCG/DAY IUD 1 each by Intrauterine route once.   Yes [provider]  predniSONE (DELTASONE) 20 MG tablet Take 1 tablet (20 mg total) by mouth daily with breakfast. 01/18/23  Yes Weldon-Southworth, Nettie Elm, PA-C  tranexamic acid (LYSTEDA) 650 MG TABS tablet Take 650 mg by mouth daily. 01/04/23  Yes [provider]  triamcinolone cream (KENALOG) 0.1 % Apply 1 Application topically as directed. 10/30/22  Yes [provider]  amphetamine-dextroamphetamine (ADDERALL) 20 MG tablet Take 20 mg by mouth 3 (three) times daily. 09/06/21   [provider]  buPROPion (WELLBUTRIN XL) 150 MG 24 hr tablet Take 450 mg by mouth daily. 02/27/21   [provider]  DULoxetine (CYMBALTA) 60 MG capsule Take 60 mg by mouth daily. 09/14/21   [provider]  estradiol (CLIMARA - DOSED IN MG/24 HR) 0.05 mg/24hr patch Place 0.05 mg onto the skin once a week.    [provider]  hydrOXYzine (ATARAX) 25 MG tablet TAKE 1 TABLET BY MOUTH AT BEDTIME AS NEEDED. 12/19/22   Georgina Quint, MD  hydrOXYzine (ATARAX) 25 MG tablet Take 1 tablet by mouth at bedtime as needed.    [provider]  ibuprofen (ADVIL) 800 MG tablet Take 1 tablet (800 mg total) by mouth every 8 (eight) hours as needed  (pain). 06/10/22   Zenia Resides, MD  levothyroxine (SYNTHROID) 50 MCG tablet Take 1 tablet (50 mcg total) by mouth daily before breakfast. 11/06/22   Judi Saa, DO  Olopatadine HCl (PATADAY) 0.2 % SOLN Place 1 drop into both eyes daily as needed. 08/31/21   Marcelyn Bruins, MD  PARoxetine (PAXIL) 20 MG tablet Take 20 mg by mouth daily. 05/31/22   [provider]  RYALTRIS [phone] MCG/ACT SUSP Place 2 sprays into the nose in the morning and at bedtime. 12/21/21   Hetty Blend, FNP  Salicylic Acid 3 % SHAM Use daily as directed for dry scalp 09/23/17   Olive Bass, FNP  selenium sulfide (SELSUN) 2.5 % shampoo USE LOTION EXTERNALLY  TO AFFECTED AREA ONCE DAILY AS NEEDED IRRITATION 02/15/20   Olive Bass, FNP  Semaglutide (RYBELSUS) 7 MG TABS Take 1 tablet (7 mg total) by mouth daily. 11/14/22   Georgina Quint, MD  traMADol (ULTRAM) 50 MG tablet Take 1 tablet (50 mg total) by mouth every 6 (six) hours as needed for moderate pain. 03/27/22   Darnell Level, MD  UNABLE TO FIND     [provider]  venlafaxine XR (EFFEXOR-XR) 37.5 MG 24 hr capsule TAKE 1 CAPSULE BY MOUTH DAILY WITH BREAKFAST. 08/05/22   Judi Saa, DO  VEOZAH 45 MG TABS TAKE 1 TABLET BY MOUTH EVERY DAY 11/22/22   Georgina Quint, MD  XIIDRA 5 % SOLN INSTILL 1 DROP TWICE DAILY INTO [facility] EYE 02/02/20   [provider]    Family History Family History  Problem Relation Age of Onset   Asthma Mother    Seizures Mother    Arthritis Mother    Lung cancer Father    Thyroid disease Maternal Grandmother    Heart disease Paternal Grandmother        2 CABG   Lung cancer Paternal Grandfather     Social History Social History   Tobacco Use   Smoking status: Never    Passive exposure: Never   Smokeless tobacco: Never  Vaping Use   Vaping status: Never Used  Substance Use Topics   Alcohol use: Not Currently    Comment: occasionally   Drug use: Never      Allergies   Bactrim [sulfamethoxazole-trimethoprim]   Review of Systems Review of Systems  As noted in HPI Physical Exam Triage Vital Signs ED Triage Vitals  Encounter Vitals Group     BP 01/18/23 0811 99/64     Systolic BP Percentile --      Diastolic BP Percentile --      Pulse Rate 01/18/23 0811 69     Resp 01/18/23 0811 18     Temp 01/18/23 0811 98.2 F (36.8 C)     Temp Source 01/18/23 0811 Oral     SpO2 01/18/23 0811 98 %     Weight 01/18/23 0809 160 lb (72.6 kg)     Height 01/18/23 0809 5\' 1"  (1.549 m)     Head Circumference --      Peak Flow --      Pain Score 01/18/23 0809 0     Pain Loc --      Pain Education --      Exclude from Growth Chart --    No data found.  Updated Vital Signs BP 99/64 (BP Location: Left Arm)   Pulse 69   Temp 98.2 F (36.8 C) (Oral)   Resp 18   Ht 5\' 1"  (1.549 m)   Wt 160 lb (72.6 kg)   LMP  (LMP Unknown)   SpO2 98%   BMI 30.23 kg/m   Visual Acuity Right Eye Distance:   Left Eye Distance:   Bilateral Distance:    Right Eye Near:   Left Eye Near:    Bilateral Near:     Physical Exam Physical Exam Vitals signs and nursing note reviewed.  Constitutional:      General: She is not in acute distress.    Appearance: Normal appearance. She is not ill-appearing, toxic-appearing or diaphoretic.  HENT:     Head: Normocephalic.     Right Ear: Tympanic membrane, ear canal and external ear normal.     Left Ear: Tympanic  membrane, ear canal and external ear normal.     Nose: Nose normal.     Mouth/Throat:     Mouth: Mucous membranes are moist.  Eyes:     General: No scleral icterus.       Right eye: No discharge.        Left eye: No discharge.     Conjunctiva/sclera: Conjunctivae normal.  Neck:     Musculoskeletal: Neck supple. No neck rigidity. NO thyroidmegally noted, but when pressing her thyroid I reproduced the pressure sensation she has been feeling.  Cardiovascular:     Rate and Rhythm: Normal rate and  regular rhythm.     Heart sounds: No murmur.  Pulmonary:     Effort: Pulmonary effort is normal.     Breath sounds: Normal breath sounds.  Abdominal:     General: Bowel sounds are normal. There is  distension.     Palpations: Abdomen is soft. There is no mass.     Tenderness: There is no abdominal tenderness. There is no guarding or rebound.     Hernia: No hernia is present.  Musculoskeletal: Normal range of motion.  Lymphadenopathy:     Cervical: No cervical adenopathy.  Skin:    General: Skin is warm and dry.     Coloration: Skin is not jaundiced.     Findings: No rash.  Neurological:     Mental Status: She is alert and oriented to person, place, and time.     Gait: Gait normal.  Psychiatric:        Mood and Affect: Mood normal.        Behavior: Behavior normal.        Thought Content: Thought content normal.        Judgment: Judgment normal.    UC Treatments / Results  Labs (all labs ordered are listed, but only abnormal results are displayed) Labs Reviewed - No data to display  EKG   Radiology DG Abd 1 View Result Date: 01/18/2023 CLINICAL DATA:  Abdominal bloating. EXAM: ABDOMEN - 1 VIEW COMPARISON:  None Available. FINDINGS: No abnormal bowel dilatation is noted. Moderate amount of stool seen throughout the colon. Intrauterine device is noted in pelvis. No radio-opaque calculi or other significant radiographic abnormality are seen. IMPRESSION: Moderate stool burden.  No abnormal bowel dilatation. Electronically Signed   By: Lupita Raider M.D.   On: 01/18/2023 08:53    Procedures Procedures (including critical [name] time)  Medications Ordered in UC Medications - No data to display  Initial Impression / Assessment and Plan / UC Course  I have reviewed the triage vital signs and the nursing notes.  Pertinent  imaging results that were available during my [name] of the patient were reviewed by me and considered in my medical decision making (see chart for  details).  I will have her try prednisone as noted  for a few days in case there is inflammation of her lower that and allegra at bedtime.  If symptoms persist she needs to FU with PCP since she may need thyroid US and or ENT visit.  Advised to drink Mag citrate bottle to empty her colon.    Final Clinical Impressions(s) / UC Diagnoses   Final diagnoses:  Post-nasal drainage  Bloating  Constipation, unspecified constipation type     Discharge Instructions      Follow up with your PCP in one week or as scheduled on 1/27 if your symptoms are not gone, you may need a thyroid  ultrasound or see ENT  Get magnesium citrate bottle and drink the whole thing to empty our colon You may take GasX or drink Anis tea as needed for bloating.  Try to avoid gluten which can contribute to bloating and constipation.      ED Prescriptions     Medication Sig Dispense Auth. Provider   predniSONE (DELTASONE) 20 MG tablet Take 1 tablet (20 mg total) by mouth daily with breakfast. 5 tablet Haun-Southworth, Nettie Elm, PA-C   fexofenadine (ALLEGRA ALLERGY) 180 MG tablet Take 1 tablet (180 mg total) by mouth daily. At bedtime for drainage 30 tablet Lundblad-Southworth, Nettie Elm, PA-C      PDMP not reviewed this encounter.   Ivionna, Penas, PA-C 01/18/23 [id number]

## 2023-01-18 NOTE — ED Triage Notes (Signed)
"  I think I have a sinus infection". "I can't sleep because I can't breath". "These sinus symptoms have been for about 10 days +". Also, "I am having intense Gas that is getting uncontrollable". No history of IBS. Some history of GERD.

## 2023-01-22 DIAGNOSIS — R208 Other disturbances of skin sensation: Secondary | ICD-10-CM | POA: Diagnosis not present

## 2023-01-22 DIAGNOSIS — Z1329 Encounter for screening for other suspected endocrine disorder: Secondary | ICD-10-CM | POA: Diagnosis not present

## 2023-01-22 DIAGNOSIS — L853 Xerosis cutis: Secondary | ICD-10-CM | POA: Diagnosis not present

## 2023-01-22 DIAGNOSIS — R5383 Other fatigue: Secondary | ICD-10-CM | POA: Diagnosis not present

## 2023-01-23 LAB — LAB REPORT - SCANNED
A1c: 6.1
Free T4: 1.22 ng/dL
TSH: 1.54 (ref 0.41–5.90)

## 2023-01-25 NOTE — Progress Notes (Unsigned)
[name] [address] [place] [address] Phone: [phone] Subjective:   Melissa [name], am serving as a scribe for Dr. Antoine Primas.  I'm seeing this patient by the request  of:  Sagardia, Eilleen Kempf, MD  CC: bilateral thumb pain and pain all over   ION:[id number]  Melissa [name] is a 48 y.o. female coming in with complaint of back and neck pain. OMT 12/03/2022. Also f/u for B thumb pain. Patient states that her back pain is the same as last visit. Pain in R wrist and thumb has returned. Not sure she wants another injection. Last epidural Nov 2024.   Medications patient has been prescribed: Effexor  Taking:yes          Reviewed prior external information including notes and imaging from previsou exam, outside providers and external EMR if available.   As well as notes that were available from care everywhere and other healthcare systems.  Past medical history, social, surgical and family history all reviewed in electronic medical record.  No pertanent information unless stated regarding to the chief complaint.   Past Medical History:  Diagnosis Date   Anxiety    Arthritis    Carpal tunnel syndrome    Depression    GERD (gastroesophageal reflux disease)    Hammer toe 04/2013   bilateral 4th and 5th    Ingrown left big toenail 04/2013   Mental disorder    Neuromuscular disorder (HCC)    Post term pregnancy over 40 weeks 09/21/2015   SVD (spontaneous vaginal delivery) 09/22/2015   UTI (lower urinary tract infection)     Allergies  Allergen Reactions   Bactrim [Sulfamethoxazole-Trimethoprim]      Review of Systems:  No headache, visual changes, nausea, vomiting, diarrhea, constipation, dizziness, abdominal pain, skin rash, fevers, chills, night sweats, weight loss, swollen lymph nodes, body aches, joint swelling, chest pain, shortness of breath, mood changes. POSITIVE muscle aches  Objective  Blood pressure 122/80, height  5\' 1"  (1.549 m), weight 165 lb (74.8 kg).   General: No apparent distress alert and oriented x3 mood and affect normal, dressed appropriately.  HEENT: Pupils equal, extraocular movements intact  Respiratory: Patient's speak in full sentences and does not appear short of breath  Cardiovascular: No lower extremity edema, non tender, no erythema  Gait MSK:  Neck exam does have some loss lordosis noted.  Positive Spurling's noted.  Seems to have radicular symptoms in the right hand mostly in the C6 and C7 distribution.  Osteopathic findings  C2 flexed rotated and side bent right C6 flexed rotated and side bent right C7 flexed rotated and side bent right T3 extended rotated and side bent right inhaled rib T9 extended rotated and side bent left L2 flexed rotated and side bent right Sacrum right on right       Assessment and Plan:  Degenerative disc disease, cervical Degenerative disc disease.  Discussed icing regimen and home exercises, which activities to do and which ones to avoid.  Increase activity slowly.  Discussed icing regimen.  Follow-up again in 6 to 8 weeks  Carpal tunnel syndrome Having surgery.  Still have the radicular symptoms is likely secondary to more of the cervical radiculopathy.    Nonallopathic problems  Decision today to treat with OMT was based on Physical Exam  After verbal consent patient was treated with HVLA, ME, FPR techniques in cervical, rib, thoracic, lumbar, and sacral  areas  Patient tolerated the procedure well with improvement  in symptoms  Patient given exercises, stretches and lifestyle modifications  See medications in patient instructions if given  Patient will follow up in 4-8 weeks     The above documentation has been reviewed and is accurate and complete Judi Saa, DO         Note: This dictation was prepared with Dragon dictation along with smaller phrase technology. Any transcriptional errors that result from this  process are unintentional.

## 2023-01-28 ENCOUNTER — Ambulatory Visit: Payer: Medicare Other | Admitting: Emergency Medicine

## 2023-01-28 ENCOUNTER — Ambulatory Visit: Payer: Medicare Other | Admitting: Family Medicine

## 2023-01-28 ENCOUNTER — Encounter: Payer: Self-pay | Admitting: Family Medicine

## 2023-01-28 ENCOUNTER — Ambulatory Visit (INDEPENDENT_AMBULATORY_CARE_PROVIDER_SITE_OTHER): Payer: Medicare Other | Admitting: Emergency Medicine

## 2023-01-28 ENCOUNTER — Encounter: Payer: Self-pay | Admitting: Emergency Medicine

## 2023-01-28 VITALS — BP 122/80 | HR 81 | Temp 98.5°F | Ht 61.0 in | Wt 166.0 lb

## 2023-01-28 VITALS — BP 122/80 | Ht 61.0 in | Wt 165.0 lb

## 2023-01-28 DIAGNOSIS — E6609 Other obesity due to excess calories: Secondary | ICD-10-CM

## 2023-01-28 DIAGNOSIS — M5412 Radiculopathy, cervical region: Secondary | ICD-10-CM | POA: Diagnosis not present

## 2023-01-28 DIAGNOSIS — F5104 Psychophysiologic insomnia: Secondary | ICD-10-CM

## 2023-01-28 DIAGNOSIS — M9903 Segmental and somatic dysfunction of lumbar region: Secondary | ICD-10-CM | POA: Diagnosis not present

## 2023-01-28 DIAGNOSIS — E28319 Asymptomatic premature menopause: Secondary | ICD-10-CM

## 2023-01-28 DIAGNOSIS — M5030 Other cervical disc degeneration, unspecified cervical region: Secondary | ICD-10-CM

## 2023-01-28 DIAGNOSIS — M9901 Segmental and somatic dysfunction of cervical region: Secondary | ICD-10-CM

## 2023-01-28 DIAGNOSIS — M9902 Segmental and somatic dysfunction of thoracic region: Secondary | ICD-10-CM | POA: Diagnosis not present

## 2023-01-28 DIAGNOSIS — F319 Bipolar disorder, unspecified: Secondary | ICD-10-CM

## 2023-01-28 DIAGNOSIS — G5601 Carpal tunnel syndrome, right upper limb: Secondary | ICD-10-CM

## 2023-01-28 DIAGNOSIS — Z889 Allergy status to unspecified drugs, medicaments and biological substances status: Secondary | ICD-10-CM | POA: Insufficient documentation

## 2023-01-28 DIAGNOSIS — M9908 Segmental and somatic dysfunction of rib cage: Secondary | ICD-10-CM

## 2023-01-28 DIAGNOSIS — M9904 Segmental and somatic dysfunction of sacral region: Secondary | ICD-10-CM | POA: Diagnosis not present

## 2023-01-28 MED ORDER — METHYLPREDNISOLONE ACETATE 80 MG/ML IJ SUSP
80.0000 mg | Freq: Once | INTRAMUSCULAR | Status: AC
  Administered 2023-01-28: 80 mg via INTRAMUSCULAR

## 2023-01-28 MED ORDER — KETOROLAC TROMETHAMINE 60 MG/2ML IM SOLN
60.0000 mg | Freq: Once | INTRAMUSCULAR | Status: AC
  Administered 2023-01-28: 60 mg via INTRAMUSCULAR

## 2023-01-28 NOTE — Assessment & Plan Note (Signed)
Still unhappy about her weight. Would like to lose about 20 pounds Advised she is not a candidate for medications or bariatric surgery Diet and nutrition discussed Benefits of exercise discussed Chronic anxiety contributing to this problem Needs to follow-up with psychiatrist

## 2023-01-28 NOTE — Assessment & Plan Note (Addendum)
Continues intermittently Sleep hygiene measures discussed

## 2023-01-28 NOTE — Progress Notes (Signed)
Melissa [name] 48 y.o.   Chief Complaint  Patient presents with   Medication Management    Patient here for referral for .Marland Kitchen Needs imaging of thyroid     HISTORY OF PRESENT ILLNESS: This is a 48 y.o. female here for follow-up of urgent care visit on 01/18/2023 when she presented with the following: 1- post nasal drainage which gets worse at bedtime. Feels something is stuck in her lower throat, and tries to clear her throat but can't spit anything out.  Has been having trouble sleeping due to having the sensation she has drainage.   Denies fever, cough, myalgias. Has appt with PCP 1/27 ,but can't wait til then. She denies getting anything stuck in her throat Was prescribed prednisone and Allegra with mixed results. Still complaining of intermittent allergy symptoms.  HPI   Prior to Admission medications   Medication Sig Start Date End Date Taking? Authorizing Provider  amphetamine-dextroamphetamine (ADDERALL) 20 MG tablet Take 20 mg by mouth 3 (three) times daily. 09/06/21   [provider]  buPROPion (WELLBUTRIN XL) 150 MG 24 hr tablet Take 450 mg by mouth daily. 02/27/21   [provider]  buPROPion (WELLBUTRIN XL) 300 MG 24 hr tablet Take 300 mg by mouth every morning. 11/08/22   [provider]  DULoxetine (CYMBALTA) 60 MG capsule Take 60 mg by mouth daily. 09/14/21   [provider]  estradiol (CLIMARA - DOSED IN MG/24 HR) 0.05 mg/24hr patch Place 0.05 mg onto the skin once a week.    [provider]  estradiol (VIVELLE-DOT) 0.1 MG/24HR patch Place 1 patch onto the skin 2 (two) times a week. 12/06/22   [provider]  fexofenadine (ALLEGRA ALLERGY) 180 MG tablet Take 1 tablet (180 mg total) by mouth daily. At bedtime for drainage 01/18/23   Bomba-Southworth, Nettie Elm, PA-C  hydrOXYzine (ATARAX) 25 MG tablet TAKE 1 TABLET BY MOUTH AT BEDTIME AS NEEDED. 12/19/22   Georgina Quint, MD  hydrOXYzine (ATARAX) 25 MG tablet Take 1  tablet by mouth at bedtime as needed.    [provider]  ibuprofen (ADVIL) 800 MG tablet Take 1 tablet (800 mg total) by mouth every 8 (eight) hours as needed (pain). 06/10/22   Zenia Resides, MD  levonorgestrel (MIRENA) 20 MCG/DAY IUD 1 each by Intrauterine route once.    [provider]  levothyroxine (SYNTHROID) 50 MCG tablet Take 1 tablet (50 mcg total) by mouth daily before breakfast. 11/06/22   Judi Saa, DO  Olopatadine HCl (PATADAY) 0.2 % SOLN Place 1 drop into both eyes daily as needed. 08/31/21   Marcelyn Bruins, MD  PARoxetine (PAXIL) 20 MG tablet Take 20 mg by mouth daily. 05/31/22   [provider]  predniSONE (DELTASONE) 20 MG tablet Take 1 tablet (20 mg total) by mouth daily with breakfast. 01/18/23   Citro-Southworth, Nettie Elm, PA-C  RYALTRIS [phone] MCG/ACT SUSP Place 2 sprays into the nose in the morning and at bedtime. 12/21/21   Hetty Blend, FNP  Salicylic Acid 3 % SHAM Use daily as directed for dry scalp 09/23/17   Olive Bass, FNP  selenium sulfide (SELSUN) 2.5 % shampoo USE LOTION EXTERNALLY  TO AFFECTED AREA ONCE DAILY AS NEEDED IRRITATION 02/15/20   Olive Bass, FNP  Semaglutide (RYBELSUS) 7 MG TABS Take 1 tablet (7 mg total) by mouth daily. 11/14/22   Georgina Quint, MD  traMADol (ULTRAM) 50 MG tablet Take 1 tablet (50 mg total) by mouth every  6 (six) hours as needed for moderate pain. 03/27/22   Darnell Level, MD  tranexamic acid (LYSTEDA) 650 MG TABS tablet Take 650 mg by mouth daily. 01/04/23   [provider]  triamcinolone cream (KENALOG) 0.1 % Apply 1 Application topically as directed. 10/30/22   [provider]  UNABLE TO FIND     [provider]  venlafaxine XR (EFFEXOR-XR) 37.5 MG 24 hr capsule TAKE 1 CAPSULE BY MOUTH DAILY WITH BREAKFAST. 08/05/22   Judi Saa, DO  VEOZAH 45 MG TABS TAKE 1 TABLET BY MOUTH EVERY DAY 11/22/22   Georgina Quint, MD  XIIDRA 5 %  SOLN INSTILL 1 DROP TWICE DAILY INTO [facility] EYE 02/02/20   [provider]    Allergies  Allergen Reactions   Bactrim [Sulfamethoxazole-Trimethoprim]     Patient Active Problem List   Diagnosis Date Noted   Arthritis of carpometacarpal [facility]) joint of both thumbs 10/08/2022   Chronic insomnia 10/04/2022   Early menopause 05/21/2022   Vasomotor symptoms due to menopause 10/16/2021   Essential hypertension 06/28/2021   Chronic pain of both knees 06/28/2021   Weight gain 02/08/2021   Family history of thyroid disease 02/08/2021   Gastroesophageal reflux disease 11/29/2020   Nonallopathic lesion of lumbosacral region 03/01/2019   Nonallopathic lesion of cervical region 03/01/2019   Nonallopathic lesion of sacral region 03/01/2019   Nonallopathic lesion of thoracic region 03/01/2019   Nonallopathic lesion of rib cage 03/01/2019   Vitamin D deficiency 05/08/2018   Piriformis syndrome, left 02/25/2017   Mixed stress and urge urinary incontinence 02/07/2016   Carpal tunnel syndrome 01/03/2016   Polyarthralgia 01/03/2016   Radial tunnel syndrome 06/17/2014   Cervical radiculopathy 06/10/2014   Bipolar depression (HCC) 05/17/2014   Chronic night sweats 06/26/2013   Obesity without serious comorbidity 01/25/2012    Past Medical History:  Diagnosis Date   Anxiety    Arthritis    Carpal tunnel syndrome    Depression    GERD (gastroesophageal reflux disease)    Hammer toe 04/2013   bilateral 4th and 5th    Ingrown left big toenail 04/2013   Mental disorder    Neuromuscular disorder (HCC)    Post term pregnancy over 40 weeks 09/21/2015   SVD (spontaneous vaginal delivery) 09/22/2015   UTI (lower urinary tract infection)     Past Surgical History:  Procedure Laterality Date   BREAST LIFT Bilateral    CARPAL TUNNEL RELEASE     CORRECTION HAMMER TOE Bilateral    CRYOABLATION  2004   CYSTOSCOPY WITH HYDRODISTENSION AND BIOPSY  07/28/2004   with urethral dilatation   DILATION  AND CURETTAGE OF UTERUS     DILATION AND EVACUATION  12/21/2011   Procedure: DILATATION AND EVACUATION;  Surgeon: Ok Edwards, MD;  Location: [facility] [place];  Service: Gynecology;  Laterality: N/A;  first trimester    ESOPHAGEAL MANOMETRY N/A 02/10/2021   Procedure: ESOPHAGEAL MANOMETRY (EM);  Surgeon: Napoleon Form, MD;  Location: [facility];  Service: Endoscopy;  Laterality: N/A;   HAMMER TOE SURGERY Bilateral 04/20/2013   Procedure: BILATERAL HAMMER TOE REPAIR AND EXCISION NAIL PERMANENT LEFT FIRST;  Surgeon: Alvan Dame, DPM;  Location: [facility];  Service: Podiatry;  Laterality: Bilateral;  Hammer toe repair fourth and fifth toes bilateral feet with screw placement fourth toes and partial nail excision left great toe   HYSTEROSALPINGOGRAM     MASS EXCISION Left 03/27/2022   Procedure: EXCISION SOFT TISSUE MASS x2,  LEFT UPPER ARM;  Surgeon: Darnell Level, MD;  Location: [facility];  Service: General;  Laterality: Left;  LMA   REDUCTION MAMMAPLASTY Bilateral 2020   TOE SURGERY     ingrown toenail   WISDOM TOOTH EXTRACTION      Social History   Socioeconomic History   Marital status: Single    Spouse name: Not on file   Number of children: 1   Years of education: Not on file   Highest education level: Not on file  Occupational History   Occupation: Homemaker  Tobacco Use   Smoking status: Never    Passive exposure: Never   Smokeless tobacco: Never  Vaping Use   Vaping status: Never Used  Substance and Sexual Activity   Alcohol use: Not Currently    Comment: occasionally   Drug use: Never   Sexual activity: Not Currently    Birth control/protection: I.U.D.  Other Topics Concern   Not on file  Social History Narrative   Not on file   Social Drivers of Health   Financial Resource Strain: Low Risk  (10/11/2021)   Overall Financial Resource Strain (CARDIA)    Difficulty of Paying Living Expenses: Not hard at all   Food Insecurity: No Food Insecurity (10/11/2021)   Hunger Vital Sign    Worried About Running Out of Food in the Last Year: Never true    Ran Out of Food in the Last Year: Never true  Transportation Needs: No Transportation Needs (10/11/2021)   PRAPARE - Administrator, Civil Service (Medical): No    Lack of Transportation (Non-Medical): No  Physical Activity: Sufficiently Active (10/11/2021)   Exercise Vital Sign    Days of Exercise per Week: 3 days    Minutes of Exercise per Session: 60 min  Stress: No Stress Concern Present (10/11/2021)   [employer] of Occupational Health - Occupational Stress Questionnaire    Feeling of Stress : Not at all  Social Connections: Unknown (10/11/2021)   Social Connection and Isolation Panel [NHANES]    Frequency of Communication with Friends and Family: More than three times a week    Frequency of Social Gatherings with Friends and Family: More than three times a week    Attends Religious Services: More than 4 times per year    Active Member of [employer] or Organizations: No    Attends Banker Meetings: Never    Marital Status: Patient declined  Catering manager Violence: Not At Risk (10/11/2021)   Humiliation, Afraid, Rape, and Kick questionnaire    Fear of Current or Ex-Partner: No    Emotionally Abused: No    Physically Abused: No    Sexually Abused: No    Family History  Problem Relation Age of Onset   Asthma Mother    Seizures Mother    Arthritis Mother    Lung cancer Father    Thyroid disease Maternal Grandmother    Heart disease Paternal Grandmother        2 CABG   Lung cancer Paternal Grandfather      Review of Systems  Constitutional: Negative.  Negative for chills and fever.  HENT:  Positive for congestion.   Respiratory:  Positive for cough and sputum production.   Cardiovascular: Negative.  Negative for chest pain and palpitations.  Gastrointestinal:  Negative for abdominal pain, diarrhea,  nausea and vomiting.  Genitourinary:  Negative for dysuria.  Skin: Negative.  Negative for itching and rash.  Neurological: Negative.  Negative  for dizziness and headaches.  All other systems reviewed and are negative.   Today's Vitals   01/28/23 0911  BP: 122/80  Pulse: 81  Temp: 98.5 F (36.9 C)  TempSrc: Oral  SpO2: 97%  Weight: 166 lb (75.3 kg)  Height: 5\' 1"  (1.549 m)   Body mass index is 31.37 kg/m.   Physical Exam Vitals reviewed.  Constitutional:      Appearance: Normal appearance.  HENT:     Head: Normocephalic.     Mouth/Throat:     Mouth: Mucous membranes are moist.     Pharynx: Oropharynx is clear.  Eyes:     Extraocular Movements: Extraocular movements intact.  Cardiovascular:     Rate and Rhythm: Normal rate.  Pulmonary:     Effort: Pulmonary effort is normal.  Skin:    General: Skin is warm and dry.  Neurological:     Mental Status: She is alert and oriented to person, place, and time.  Psychiatric:        Mood and Affect: Mood normal.        Behavior: Behavior normal.      ASSESSMENT & PLAN: A total of 42 minutes was spent with the patient and counseling/coordination of care regarding preparing for this visit, review of most recent office visit notes, review of most recent urgent care visit notes, review of multiple chronic medical conditions and their management, review of all medications, review of most recent bloodwork results, review of health maintenance items, education on nutrition, prognosis, documentation, and need for follow up.   Problem List Items Addressed This Visit       Other   Obesity without serious comorbidity   Still unhappy about her weight. Would like to lose about 20 pounds Advised she is not a candidate for medications or bariatric surgery Diet and nutrition discussed Benefits of exercise discussed Chronic anxiety contributing to this problem Needs to follow-up with psychiatrist      Bipolar depression [facility])    Has follow-up appointment coming up next month. States she has been off her medications for several weeks      Early menopause   Has been able to follow-up with gynecologist recently      Chronic insomnia   Continues intermittently Sleep hygiene measures discussed      Multiple allergies - Primary   Head and neck symptoms most likely secondary to chronic allergies Gets some response from over-the-counter antihistamines Recent course of prednisone did help. Recommend evaluation by allergist Referral placed today.      Relevant Orders   Ambulatory referral to Allergy   Patient Instructions  Health Maintenance, Female Adopting a healthy lifestyle and getting preventive care are important in promoting health and wellness. Ask your health care provider about: The right schedule for you to have regular tests and exams. Things you can do on your own to prevent diseases and keep yourself healthy. What should I know about diet, weight, and exercise? Eat a healthy diet  Eat a diet that includes plenty of vegetables, fruits, low-fat dairy products, and lean protein. Do not eat a lot of foods that are high in solid fats, added sugars, or sodium. Maintain a healthy weight Body mass index (BMI) is used to identify weight problems. It estimates body fat based on height and weight. Your health care provider can help determine your BMI and help you achieve or maintain a healthy weight. Get regular exercise Get regular exercise. This is one of the most important things you  can do for your health. Most adults should: Exercise for at least 150 minutes each week. The exercise should increase your heart rate and make you sweat (moderate-intensity exercise). Do strengthening exercises at least twice a week. This is in addition to the moderate-intensity exercise. Spend less time sitting. Even light physical activity can be beneficial. Watch cholesterol and blood lipids Have your blood tested for  lipids and cholesterol at 48 years of age, then have this test every 5 years. Have your cholesterol levels checked more often if: Your lipid or cholesterol levels are high. You are older than 48 years of age. You are at high risk for heart disease. What should I know about cancer screening? Depending on your health history and family history, you may need to have cancer screening at various ages. This may include screening for: Breast cancer. Cervical cancer. Colorectal cancer. Skin cancer. Lung cancer. What should I know about heart disease, diabetes, and high blood pressure? Blood pressure and heart disease High blood pressure causes heart disease and increases the risk of stroke. This is more likely to develop in people who have high blood pressure readings or are overweight. Have your blood pressure checked: Every 3-5 years if you are 55-19 years of age. Every year if you are 35 years old or older. Diabetes Have regular diabetes screenings. This checks your fasting blood sugar level. Have the screening done: Once every three years after age 80 if you are at a normal weight and have a low risk for diabetes. More often and at a younger age if you are overweight or have a high risk for diabetes. What should I know about preventing infection? Hepatitis B If you have a higher risk for hepatitis B, you should be screened for this virus. Talk with your health care provider to find out if you are at risk for hepatitis B infection. Hepatitis C Testing is recommended for: Everyone born from [birth year] through 1965. Anyone with known risk factors for hepatitis C. Sexually transmitted infections (STIs) Get screened for STIs, including gonorrhea and chlamydia, if: You are sexually active and are younger than 48 years of age. You are older than 48 years of age and your health care provider tells you that you are at risk for this type of infection. Your sexual activity has changed since you were last  screened, and you are at increased risk for chlamydia or gonorrhea. Ask your health care provider if you are at risk. Ask your health care provider about whether you are at high risk for HIV. Your health care provider may recommend a prescription medicine to help prevent HIV infection. If you choose to take medicine to prevent HIV, you should first get tested for HIV. You should then be tested every 3 months for as long as you are taking the medicine. Pregnancy If you are about to stop having your period (premenopausal) and you may become pregnant, seek counseling before you get pregnant. Take 400 to 800 micrograms (mcg) of folic acid every day if you become pregnant. Ask for birth control (contraception) if you want to prevent pregnancy. Osteoporosis and menopause Osteoporosis is a disease in which the bones lose minerals and strength with aging. This can result in bone fractures. If you are 31 years old or older, or if you are at risk for osteoporosis and fractures, ask your health care provider if you should: Be screened for bone loss. Take a calcium or vitamin D supplement to lower your risk of fractures. Be  given hormone replacement therapy (HRT) to treat symptoms of menopause. Follow these instructions at home: Alcohol use Do not drink alcohol if: Your health care provider tells you not to drink. You are pregnant, may be pregnant, or are planning to become pregnant. If you drink alcohol: Limit how much you have to: 0-1 drink a day. Know how much alcohol is in your drink. In the [place] one drink equals one 12 oz bottle of beer (355 mL), one 5 oz glass of wine (148 mL), or one 1 oz glass of hard liquor (44 mL). Lifestyle Do not use any products that contain nicotine or tobacco. These products include cigarettes, chewing tobacco, and vaping devices, such as e-cigarettes. If you need help quitting, ask your health care provider. Do not use street drugs. Do not share needles. Ask your health  care provider for help if you need support or information about quitting drugs. General instructions Schedule regular health, dental, and eye exams. Stay current with your vaccines. Tell your health care provider if: You often feel depressed. You have ever been abused or do not feel safe at home. Summary Adopting a healthy lifestyle and getting preventive care are important in promoting health and wellness. Follow your health care provider's instructions about healthy diet, exercising, and getting tested or screened for diseases. Follow your health care provider's instructions on monitoring your cholesterol and blood pressure. This information is not intended to replace advice given to you by your health care provider. Make sure you discuss any questions you have with your health care provider. Document Revised: 05/09/2020 Document Reviewed: 05/09/2020 Elsevier Patient Education  2024 Elsevier Inc.    Edwina Barth, MD [facility]

## 2023-01-28 NOTE — Patient Instructions (Signed)
See me in 8 weeks Epidural [phone]

## 2023-01-28 NOTE — Assessment & Plan Note (Addendum)
Degenerative disc disease.  Discussed icing regimen and home exercises, which activities to do and which ones to avoid.  Increase activity slowly.  Discussed icing regimen.  Follow-up again in 6 to 8 weeks Toradol and Depo-Medrol injection given today secondary to the exacerbation and pain.

## 2023-01-28 NOTE — Patient Instructions (Signed)

## 2023-01-28 NOTE — Assessment & Plan Note (Signed)
Has follow-up appointment coming up next month. States she has been off her medications for several weeks

## 2023-01-28 NOTE — Assessment & Plan Note (Signed)
Having surgery.  Still have the radicular symptoms is likely secondary to more of the cervical radiculopathy.

## 2023-01-28 NOTE — Assessment & Plan Note (Signed)
Has been able to follow-up with gynecologist recently

## 2023-01-28 NOTE — Assessment & Plan Note (Signed)
Head and neck symptoms most likely secondary to chronic allergies Gets some response from over-the-counter antihistamines Recent course of prednisone did help. Recommend evaluation by allergist Referral placed today.

## 2023-01-29 ENCOUNTER — Telehealth: Payer: Self-pay | Admitting: Emergency Medicine

## 2023-01-29 NOTE — Telephone Encounter (Signed)
Copied from CRM [id number]. Topic: General - Other >> Jan 29, 2023  3:07 PM Corin V wrote: Reason for CRM: Patient called with an update on the call from earlier today. She stated it was urgent that she speak to a nurse before she hangs up. Called [facility] and no nurses available. Advised patient and asked for more information as she is stating it is an urgent matter.  Patient stated she went to her gynecologist yesterday and labs show that she is pre-diabetic. Dr. Domenic Polite office sent the results to PCP today. Patient asked that if labs are not received by end of day if the office can call Dr. Domenic Polite office and speak to [place] at [phone] to have them resend the labs. She is wanting to be started on medication once Dr. Alvy Bimler reviews the labs.

## 2023-01-29 NOTE — Telephone Encounter (Signed)
Copied from CRM [id number]. Topic: Clinical - Lab/Test Results >> Jan 29, 2023  9:32 AM Lennart Pall wrote: Reason for CRM: Patient would like a nurse to follow up with her to go over a conversation from her visit with the Dr yesterday [phone]

## 2023-01-29 NOTE — Telephone Encounter (Unsigned)
Copied from CRM [id number]. Topic: Clinical - Lab/Test Results >> Jan 29, 2023  9:32 AM Lennart Pall wrote: Reason for CRM: Patient would like a nurse to follow up with her to go over a conversation from her visit with the Dr yesterday [phone]

## 2023-01-30 ENCOUNTER — Telehealth: Payer: Self-pay | Admitting: *Deleted

## 2023-01-30 ENCOUNTER — Encounter: Payer: Self-pay | Admitting: Emergency Medicine

## 2023-01-30 NOTE — Telephone Encounter (Signed)
Okay, thank you!

## 2023-01-30 NOTE — Telephone Encounter (Unsigned)
Copied from CRM [id number]. Topic: Clinical - Prescription Issue >> Jan 30, 2023 10:51 AM Theodis Sato wrote: Reason for CRM: Jasmine from [facility] calling on behalf of patient states they have not received a prior authorization for Semaglutide (RYBELSUS) 7 MG TABS- Please re-fax to [phone]

## 2023-01-30 NOTE — Telephone Encounter (Signed)
Spoke with patient and message was sent to provider.

## 2023-01-31 ENCOUNTER — Other Ambulatory Visit: Payer: Self-pay | Admitting: Family Medicine

## 2023-01-31 ENCOUNTER — Encounter: Payer: Self-pay | Admitting: Emergency Medicine

## 2023-01-31 NOTE — Telephone Encounter (Signed)
Please see encounter from CMA, Melissa [name], dated 01/08/2023 in which she states she has already submitted a verbal appeal to insurance. Please also note encounter from 11/15/2023, in which pharmacist states that no further GLP-1 PA's will be attempted for pt, as she is not diabetic and therefore does NOT meet qualifications for therapy. Thank you.

## 2023-02-04 ENCOUNTER — Other Ambulatory Visit: Payer: Self-pay | Admitting: Emergency Medicine

## 2023-02-04 NOTE — Telephone Encounter (Signed)
Reason for CRM: Patient is calling in to let provider know that her appeal for prior authorization of Semaglutide (RYBELSUS) 7 MG TABS was denied again, due to paperwork stating the reason for medication is weight loss. She needs the paper work to state it is for prediabetes and mentions elevated results from her POCT HgB A1C. CB# [phone]

## 2023-02-05 ENCOUNTER — Ambulatory Visit: Payer: Medicare Other | Admitting: Podiatry

## 2023-02-07 NOTE — Telephone Encounter (Signed)
 Please see fax number provided by CMA Antoniette Batty below.

## 2023-02-07 NOTE — Telephone Encounter (Signed)
Lab results re-faxed

## 2023-02-08 ENCOUNTER — Telehealth (INDEPENDENT_AMBULATORY_CARE_PROVIDER_SITE_OTHER): Payer: Self-pay | Admitting: Otolaryngology

## 2023-02-08 NOTE — Telephone Encounter (Signed)
 Confirmed appt and address with patient for 02/11/2023.

## 2023-02-11 ENCOUNTER — Ambulatory Visit (INDEPENDENT_AMBULATORY_CARE_PROVIDER_SITE_OTHER): Payer: Medicare Other

## 2023-02-12 ENCOUNTER — Encounter: Payer: Self-pay | Admitting: Podiatry

## 2023-02-12 ENCOUNTER — Ambulatory Visit (INDEPENDENT_AMBULATORY_CARE_PROVIDER_SITE_OTHER): Payer: Medicare Other | Admitting: Podiatry

## 2023-02-12 DIAGNOSIS — M722 Plantar fascial fibromatosis: Secondary | ICD-10-CM | POA: Diagnosis not present

## 2023-02-12 MED ORDER — TRIAMCINOLONE ACETONIDE 40 MG/ML IJ SUSP
20.0000 mg | Freq: Once | INTRAMUSCULAR | Status: AC
  Administered 2023-02-12: 20 mg

## 2023-02-13 NOTE — Progress Notes (Signed)
She presents today complaining of plantar fascial type pain to her left heel.  States that she is just about to leave on a trip to [facility] and she would like to have another injection in her left heel if possible.  Objective: Vital signs stable alert oriented x 3 she has pain on palpation medial calcaneal tubercle pulses are strong no open lesions or wounds.  Assessment: Plantar fasciitis which she has had in the past recurrent  Plan: At this point we are going to reinject her left heel today 20 mg Kenalog 5 mg Marcaine to the point of maximal tenderness.  She tolerated procedure well without complications I will follow-up with her on an as-needed basis.

## 2023-02-18 NOTE — Telephone Encounter (Signed)
 Copied from CRM [id number]. Topic: Clinical - Prescription Issue >> Feb 18, 2023  2:54 PM Gaetano Hawthorne wrote: Reason for CRM: [employer] called to notify the office that the prior authorization for the Rybelsus 7 mg was denied and the provider can re-initiate the prior authorization stating that this should be a "Formulary exception " - this can be done through the [employer] website or if they have any questions - we can call [phone].   Insurance company is also asking if we can contact the patient when this has been initiated as well.

## 2023-02-22 NOTE — Telephone Encounter (Signed)
 Copied from CRM [id number]. Topic: General - Other >> Feb 22, 2023  2:13 PM Elizebeth Brooking wrote: Reason for CRM: Patient called in wanting to speak with  Bernita Raisin , is requesting a callback

## 2023-02-22 NOTE — Telephone Encounter (Signed)
 Spoke to a rep with [place] [employer] [facility] for regarding patients request for "formulary exception" rep states the appeal has been re-opened and cannot ask for a "formulary exception" until there is a denial or approval. Called patient after and informed her with information and she became frustrated and doesn't understand why she cannot get approved for this and demanded the number I called because she believes there is a miscommunication somewhere

## 2023-02-28 ENCOUNTER — Other Ambulatory Visit: Payer: Medicare Other

## 2023-03-01 ENCOUNTER — Other Ambulatory Visit: Payer: Self-pay | Admitting: Emergency Medicine

## 2023-03-01 ENCOUNTER — Other Ambulatory Visit: Payer: Self-pay | Admitting: Gastroenterology

## 2023-03-01 DIAGNOSIS — Z1231 Encounter for screening mammogram for malignant neoplasm of breast: Secondary | ICD-10-CM | POA: Diagnosis not present

## 2023-03-01 DIAGNOSIS — N951 Menopausal and female climacteric states: Secondary | ICD-10-CM

## 2023-03-01 LAB — HM MAMMOGRAPHY

## 2023-03-01 NOTE — Telephone Encounter (Addendum)
 Melissa [name] from [employer] calling in regarding the denial for the pre authorization for rybelsus.they sent over documentation for her and it was just like her blood work . The member have to have a diagnosis of type 2 diabetes . The only information that was sent was for her was latest lab results and they are needing clinical information showing that she meets the medicare criteria for type 2 diabetes because if its just used for weight loss or trying to bring that down unfortunately medicare doesn't cover that . The case id number is [id number] and yyou can put that on the paperwork that is being faxed over  Fax number to submit clinical information [phone]

## 2023-03-04 ENCOUNTER — Other Ambulatory Visit (INDEPENDENT_AMBULATORY_CARE_PROVIDER_SITE_OTHER): Payer: Self-pay | Admitting: Pharmacist

## 2023-03-04 DIAGNOSIS — R7303 Prediabetes: Secondary | ICD-10-CM

## 2023-03-04 NOTE — Progress Notes (Unsigned)
   03/04/2023 Name: Melissa [name] MRN: [id number] DOB: 1975-07-22  Chief Complaint  Patient presents with   Medication Access   Diabetes    Melissa [name] is a 48 y.o. year old female who presented for a telephone visit.   They were referred to the pharmacist by {referredtopharmacy:27270} for assistance in managing {referralreason:27271}.     Subjective:  Care Team: Primary Care Provider: Georgina Quint, MD ; Next Scheduled Visit: not scheduled  Medication Access/Adherence  Current Pharmacy:  [id number] - [place], [place] - [address] [place] [address] Phone: [phone] Fax: [phone]  [facility] [place] - [phone] [place] [address] Phone: [phone] Fax: [phone]   Patient reports affordability concerns with their medications: {YES/NO:21197} Patient reports access/transportation concerns to their pharmacy: {YES/NO:21197} Patient reports adherence concerns with their medications:  {YES/NO:21197} ***   ?Prediabetes A1c 6.1 on 01/22/23 labs scanned into chart - diagnosis not in chart Pt previously on Rybelsys 7 mg in 2023-2024 however now insurance will not cover it  She reports excessive thirst, fatigue  Objective:  Lab Results  Component Value Date   HGBA1C 5.3 05/21/2022    Lab Results  Component Value Date   CREATININE 0.70 03/27/2022   BUN 11 03/27/2022   NA 142 03/27/2022   K 4.2 03/27/2022   CL 103 03/27/2022   CO2 29 07/06/2021    Lab Results  Component Value Date   CHOL 204 (H) 02/08/2021   HDL 59.80 02/08/2021   LDLCALC 130 (H) 02/08/2021   TRIG 68.0 02/08/2021   CHOLHDL 3 02/08/2021    Medications Reviewed Today   Medications were not reviewed in this encounter       Assessment/Plan:  Will message PCP about prediabetes dx Recommend metformin for prediabetes. Insurance will not cover a GLP-1 agonist due to no hx of  T2DM.25  Follow Up Plan: PRN  Arbutus Leas, PharmD, BCPS, CPP Clinical Pharmacist Practitioner [facility] [phone]

## 2023-03-05 ENCOUNTER — Other Ambulatory Visit: Payer: Self-pay | Admitting: Emergency Medicine

## 2023-03-05 ENCOUNTER — Telehealth: Payer: Self-pay | Admitting: Emergency Medicine

## 2023-03-05 DIAGNOSIS — R7303 Prediabetes: Secondary | ICD-10-CM | POA: Insufficient documentation

## 2023-03-05 MED ORDER — METFORMIN HCL 500 MG PO TABS: 500.0000 mg | ORAL_TABLET | Freq: Two times a day (BID) | ORAL | 3 refills | Status: DC

## 2023-03-05 MED ORDER — LANCETS MISC. MISC: 1.0000 | Freq: Every day | 0 refills | Status: AC | PRN

## 2023-03-05 MED ORDER — BLOOD GLUCOSE MONITORING SUPPL DEVI: 1.0000 | Freq: Every day | 0 refills | Status: AC | PRN

## 2023-03-05 MED ORDER — BLOOD GLUCOSE TEST VI STRP: 1.0000 | ORAL_STRIP | Freq: Every day | 0 refills | Status: DC | PRN

## 2023-03-05 MED ORDER — LANCET DEVICE MISC: 1.0000 | Freq: Every day | 0 refills | Status: AC | PRN

## 2023-03-05 NOTE — Telephone Encounter (Signed)
 Sent a message to pt informing her I am waiting for Dr. Alvy Bimler to enter a diagnosis of prediabetes before I can send the metformin and glucometer she has requested  Arbutus Leas, PharmD, BCPS, CPP Clinical Pharmacist Practitioner [facility] [phone]

## 2023-03-05 NOTE — Telephone Encounter (Signed)
 Copied from CRM [id number]. Topic: General - Other >> Mar 05, 2023  9:04 AM Truddie Crumble wrote: Reason for CRM: patient called stating christie was supposed to send a prescription in for her but the patient want the doctor to prescribed metformin for her

## 2023-03-05 NOTE — Patient Instructions (Signed)
 It was a pleasure speaking with you today!    Feel free to call with any questions or concerns!  Arbutus Leas, PharmD, BCPS, CPP Clinical Pharmacist Practitioner [facility] [phone]

## 2023-03-05 NOTE — Telephone Encounter (Signed)
 This has been sent to the pharmacist on site, Lorene Dy states she will try to get in touch with the patient to inform her of the findings

## 2023-03-11 NOTE — Progress Notes (Unsigned)
 [name] [address] [place] [address] Phone: [phone] Subjective:   Melissa [name], am serving as a scribe for Dr. Antoine Primas.  I'm seeing this patient by the request  of:  Sagardia, Eilleen Kempf, MD  CC: Back and neck pain follow-up MRI  [id number]  Melissa [name] is a 48 y.o. female coming in with complaint of back and neck pain.OMT 01/28/2023. Patient states that she has been doing well since last visit.  Does have tightness noted in the paraspinal musculature.  Medications patient has been prescribed: Effexor  Taking:         Reviewed prior external information including notes and imaging from previsou exam, outside providers and external EMR if available.   As well as notes that were available from care everywhere and other healthcare systems.  Past medical history, social, surgical and family history all reviewed in electronic medical record.  No pertanent information unless stated regarding to the chief complaint.   Past Medical History:  Diagnosis Date   Anxiety    Arthritis    Carpal tunnel syndrome    Depression    GERD (gastroesophageal reflux disease)    Hammer toe 04/2013   bilateral 4th and 5th    Ingrown left big toenail 04/2013   Mental disorder    Neuromuscular disorder (HCC)    Post term pregnancy over 40 weeks 09/21/2015   SVD (spontaneous vaginal delivery) 09/22/2015   UTI (lower urinary tract infection)     Allergies  Allergen Reactions   Bactrim [Sulfamethoxazole-Trimethoprim]      Review of Systems:  No headache, visual changes, nausea, vomiting, diarrhea, constipation, dizziness, abdominal pain, skin rash, fevers, chills, night sweats, weight loss, swollen lymph nodes, body aches, joint swelling, chest pain, shortness of breath, mood changes. POSITIVE muscle aches  Objective  Blood pressure 120/78, pulse 91, height 5' (1.524 m), weight 165 lb (74.8 kg), SpO2 98%.   General: No  apparent distress alert and oriented x3 mood and affect normal, dressed appropriately.  HEENT: Pupils equal, extraocular movements intact  Respiratory: Patient's speak in full sentences and does not appear short of breath  Cardiovascular: No lower extremity edema, non tender, no erythema  Gait MSK:  Back does have some loss lordosis noted.  Some tenderness to palpation in the paraspinal musculature.  Patient's neck exam does have some limited sidebending bilaterally with some crepitus noted.  Uncomfortable during the exam.  Osteopathic findings  C2 flexed rotated and side bent right C6 flexed rotated and side bent left T3 extended rotated and side bent right inhaled rib T9 extended rotated and side bent left L2 flexed rotated and side bent right Sacrum right on right       Assessment and Plan:  Polyarthralgia Chronic problem with exacerbation noted.  Discussed icing regimen and home exercises, discussed which activities to do and which ones to avoid.  Increase activity slowly.  Tightness with FABER is noted and will monitor.  Toradol and Depo-Medrol injections given today secondary to severity of pain.  No change in medications and continue the Effexor.  Degenerative disc disease, cervical Degenerative disc disease that responded extremely well to osteopathic manipulation today.  Discussed icing regimen and home exercises, which activities to do and which ones to avoid.  Increase activity slowly.  Follow-up again in 6 to 8 weeks otherwise.    Nonallopathic problems  Decision today to treat with OMT was based on Physical Exam  After verbal consent patient was  treated with HVLA, ME, FPR techniques in cervical, rib, thoracic, lumbar, and sacral  areas  Patient tolerated the procedure well with improvement in symptoms  Patient given exercises, stretches and lifestyle modifications  See medications in patient instructions if given  Patient will follow up in 4-8 weeks    The  above documentation has been reviewed and is accurate and complete Judi Saa, DO          Note: This dictation was prepared with Dragon dictation along with smaller phrase technology. Any transcriptional errors that result from this process are unintentional.

## 2023-03-12 ENCOUNTER — Ambulatory Visit: Payer: Medicare Other | Admitting: Family Medicine

## 2023-03-12 ENCOUNTER — Encounter: Payer: Self-pay | Admitting: Family Medicine

## 2023-03-12 VITALS — BP 120/78 | HR 91 | Ht 60.0 in | Wt 165.0 lb

## 2023-03-12 DIAGNOSIS — M9903 Segmental and somatic dysfunction of lumbar region: Secondary | ICD-10-CM | POA: Diagnosis not present

## 2023-03-12 DIAGNOSIS — M9908 Segmental and somatic dysfunction of rib cage: Secondary | ICD-10-CM | POA: Diagnosis not present

## 2023-03-12 DIAGNOSIS — M2550 Pain in unspecified joint: Secondary | ICD-10-CM

## 2023-03-12 DIAGNOSIS — M5030 Other cervical disc degeneration, unspecified cervical region: Secondary | ICD-10-CM

## 2023-03-12 DIAGNOSIS — M9902 Segmental and somatic dysfunction of thoracic region: Secondary | ICD-10-CM

## 2023-03-12 DIAGNOSIS — M9904 Segmental and somatic dysfunction of sacral region: Secondary | ICD-10-CM

## 2023-03-12 DIAGNOSIS — M9901 Segmental and somatic dysfunction of cervical region: Secondary | ICD-10-CM | POA: Diagnosis not present

## 2023-03-12 MED ORDER — METHYLPREDNISOLONE ACETATE 80 MG/ML IJ SUSP
80.0000 mg | Freq: Once | INTRAMUSCULAR | Status: AC
  Administered 2023-03-12: 80 mg via INTRAMUSCULAR

## 2023-03-12 MED ORDER — KETOROLAC TROMETHAMINE 60 MG/2ML IM SOLN
60.0000 mg | Freq: Once | INTRAMUSCULAR | Status: AC
  Administered 2023-03-12: 60 mg via INTRAMUSCULAR

## 2023-03-12 NOTE — Patient Instructions (Signed)
 Injections in backside See me in 2 months

## 2023-03-12 NOTE — Assessment & Plan Note (Signed)
 Degenerative disc disease that responded extremely well to osteopathic manipulation today.  Discussed icing regimen and home exercises, which activities to do and which ones to avoid.  Increase activity slowly.  Follow-up again in 6 to 8 weeks otherwise.

## 2023-03-12 NOTE — Assessment & Plan Note (Signed)
 Chronic problem with exacerbation noted.  Discussed icing regimen and home exercises, discussed which activities to do and which ones to avoid.  Increase activity slowly.  Tightness with FABER is noted and will monitor.  Toradol and Depo-Medrol injections given today secondary to severity of pain.  No change in medications and continue the Effexor.

## 2023-03-19 ENCOUNTER — Ambulatory Visit (INDEPENDENT_AMBULATORY_CARE_PROVIDER_SITE_OTHER): Payer: Medicare Other

## 2023-03-27 ENCOUNTER — Other Ambulatory Visit: Payer: Self-pay | Admitting: Gastroenterology

## 2023-03-27 ENCOUNTER — Other Ambulatory Visit: Payer: Self-pay | Admitting: Emergency Medicine

## 2023-03-27 DIAGNOSIS — R7303 Prediabetes: Secondary | ICD-10-CM

## 2023-04-18 ENCOUNTER — Ambulatory Visit (INDEPENDENT_AMBULATORY_CARE_PROVIDER_SITE_OTHER)

## 2023-04-18 ENCOUNTER — Encounter (INDEPENDENT_AMBULATORY_CARE_PROVIDER_SITE_OTHER): Payer: Self-pay

## 2023-04-25 ENCOUNTER — Ambulatory Visit (INDEPENDENT_AMBULATORY_CARE_PROVIDER_SITE_OTHER): Admitting: Emergency Medicine

## 2023-04-25 ENCOUNTER — Encounter: Payer: Self-pay | Admitting: Emergency Medicine

## 2023-04-25 VITALS — BP 132/88 | HR 84 | Temp 98.9°F | Ht 60.0 in | Wt 167.0 lb

## 2023-04-25 DIAGNOSIS — R7303 Prediabetes: Secondary | ICD-10-CM | POA: Diagnosis not present

## 2023-04-25 DIAGNOSIS — Z8349 Family history of other endocrine, nutritional and metabolic diseases: Secondary | ICD-10-CM | POA: Diagnosis not present

## 2023-04-25 DIAGNOSIS — F319 Bipolar disorder, unspecified: Secondary | ICD-10-CM

## 2023-04-25 DIAGNOSIS — R143 Flatulence: Secondary | ICD-10-CM | POA: Diagnosis not present

## 2023-04-25 DIAGNOSIS — N951 Menopausal and female climacteric states: Secondary | ICD-10-CM

## 2023-04-25 DIAGNOSIS — K219 Gastro-esophageal reflux disease without esophagitis: Secondary | ICD-10-CM | POA: Diagnosis not present

## 2023-04-25 DIAGNOSIS — I10 Essential (primary) hypertension: Secondary | ICD-10-CM | POA: Diagnosis not present

## 2023-04-25 NOTE — Assessment & Plan Note (Addendum)
 Most likely nutritional. Differential diagnosis discussed Clinically stable.  No red flag signs or symptoms Benign abdominal examination Advised to take over-the-counter simethicone  2-3 times a day for the next 2 weeks Recently started on metformin  for prediabetes. Most likely related to metformin .  Recommend to stop it. Recommend to also stop Synthroid .

## 2023-04-25 NOTE — Assessment & Plan Note (Signed)
Thyroid panel with TSH done today. Clinically euthyroid.

## 2023-04-25 NOTE — Assessment & Plan Note (Signed)
Much improved with Veozah 45 mg daily

## 2023-04-25 NOTE — Assessment & Plan Note (Signed)
 BP Readings from Last 3 Encounters:  04/25/23 132/88  03/12/23 120/78  01/28/23 122/80  Well-controlled hypertension Continue lisinopril  20 mg daily

## 2023-04-25 NOTE — Progress Notes (Signed)
 Melissa [name] 48 y.o.   Chief Complaint  Patient presents with   Gas    Patient states she's been excessive gas where she can't control it, also mentions having different bowel movements where it would be soft, solid, and diarrhea all in the same day. Patient states she wants to discuss her DM medication.     HISTORY OF PRESENT ILLNESS: This is a 48 y.o. female complaining of excessive bloating and gas for the past couple of weeks Variable bowel movements with intermittent diarrhea and constipation No other complaints or medical concerns today.  HPI   Prior to Admission medications   Medication Sig Start Date End Date Taking? Authorizing Provider  ACCU-CHEK GUIDE TEST test strip USE AS DIRECTED ONCE DAILY AS NEEDED 03/27/23  Yes Vivaan Helseth, Isidro Margo, MD  Blood Glucose Monitoring Suppl DEVI 1 each by Does not apply route daily as needed. May substitute to any manufacturer covered by patient's insurance. 03/05/23  Yes Srinidhi Landers Jose, MD  buPROPion (WELLBUTRIN XL) 150 MG 24 hr tablet Take 450 mg by mouth daily. 02/27/21  Yes [provider]  buPROPion (WELLBUTRIN XL) 300 MG 24 hr tablet Take 300 mg by mouth every morning. 11/08/22  Yes [provider]  DULoxetine (CYMBALTA) 60 MG capsule Take 60 mg by mouth daily. 09/14/21  Yes [provider]  estradiol (CLIMARA - DOSED IN MG/24 HR) 0.05 mg/24hr patch Place 0.05 mg onto the skin once a week.   Yes [provider]  estradiol (VIVELLE-DOT) 0.1 MG/24HR patch Place 1 patch onto the skin 2 (two) times a week. 12/06/22  Yes [provider]  fexofenadine  (ALLEGRA  ALLERGY ) 180 MG tablet Take 1 tablet (180 mg total) by mouth daily. At bedtime for drainage 01/18/23  Yes Matzek-Southworth, Sylvia, PA-C  hydrOXYzine  (ATARAX ) 25 MG tablet Take 1 tablet by mouth at bedtime as needed.   Yes [provider]  ibuprofen  (ADVIL ) 800 MG tablet Take 1 tablet (800 mg total) by mouth every 8 (eight)  hours as needed (pain). 06/10/22  Yes Ann Keto, MD  Lancets Misc. MISC 1 each by Does not apply route daily as needed. May substitute to any manufacturer covered by patient's insurance. 03/05/23  Yes Isahia Hollerbach Jose, MD  levonorgestrel (MIRENA) 20 MCG/DAY IUD 1 each by Intrauterine route once.   Yes [provider]  levothyroxine  (SYNTHROID ) 50 MCG tablet TAKE 1 TABLET BY MOUTH DAILY BEFORE BREAKFAST 03/01/23  Yes Lyrika Souders, Isidro Margo, MD  lisinopril  (ZESTRIL ) 20 MG tablet TAKE 1 TABLET BY MOUTH EVERY DAY 02/04/23  Yes Babak Lucus Jose, MD  metFORMIN  (GLUCOPHAGE ) 500 MG tablet Take 1 tablet (500 mg total) by mouth 2 (two) times daily with a meal. 03/05/23  Yes Javeon Macmurray, Isidro Margo, MD  Olopatadine  HCl (PATADAY ) 0.2 % SOLN Place 1 drop into both eyes daily as needed. 08/31/21  Yes Padgett, Rhoderick Ceo, MD  PARoxetine (PAXIL) 20 MG tablet Take 20 mg by mouth daily. 05/31/22  Yes [provider]  RYALTRIS  [phone] MCG/ACT SUSP Place 2 sprays into the nose in the morning and at bedtime. 12/21/21  Yes Ambs, Jeanmarie Millet, FNP  Salicylic Acid  3 % SHAM Use daily as directed for dry scalp 09/23/17  Yes Adra Alanis, FNP  selenium  sulfide (SELSUN ) 2.5 % shampoo USE LOTION EXTERNALLY  TO AFFECTED AREA ONCE DAILY AS NEEDED IRRITATION 02/15/20  Yes Adra Alanis, FNP  traMADol  (ULTRAM ) 50 MG tablet Take 1 tablet (50 mg total) by mouth every 6 (  six) hours as needed for moderate pain. 03/27/22  Yes Oralee Billow, MD  tranexamic acid (LYSTEDA) 650 MG TABS tablet Take 650 mg by mouth daily. 01/04/23  Yes [provider]  triamcinolone  cream (KENALOG ) 0.1 % Apply 1 Application topically as directed. 10/30/22  Yes [provider]  UNABLE TO FIND    Yes [provider]  venlafaxine  XR (EFFEXOR -XR) 37.5 MG 24 hr capsule TAKE 1 CAPSULE BY MOUTH DAILY WITH BREAKFAST. 08/05/22  Yes Smith, Zachary M, DO  VEOZAH  45 MG TABS TAKE 1 TABLET BY MOUTH EVERY DAY  03/01/23  Yes Shrinika Blatz, [place], MD  XIIDRA 5 % SOLN INSTILL 1 DROP TWICE DAILY INTO [facility] EYE 02/02/20  Yes [provider]  amphetamine-dextroamphetamine (ADDERALL) 20 MG tablet Take 20 mg by mouth 3 (three) times daily. Patient not taking: Reported on 04/25/2023 09/06/21   [provider]  hydrOXYzine  (ATARAX ) 25 MG tablet TAKE 1 TABLET BY MOUTH AT BEDTIME AS NEEDED. Patient not taking: Reported on 04/25/2023 12/19/22   Elvira Hammersmith, MD  Semaglutide  (RYBELSUS ) 7 MG TABS Take 1 tablet (7 mg total) by mouth daily. Patient not taking: Reported on 04/25/2023 11/14/22   Elvira Hammersmith, MD    Allergies  Allergen Reactions   Bactrim  [Sulfamethoxazole -Trimethoprim ]     Patient Active Problem List   Diagnosis Date Noted   Prediabetes 03/05/2023   Multiple allergies 01/28/2023   Degenerative disc disease, cervical 01/28/2023   Arthritis of carpometacarpal [facility] joint of both thumbs 10/08/2022   Chronic insomnia 10/04/2022   Early menopause 05/21/2022   Vasomotor symptoms due to menopause 10/16/2021   Essential hypertension 06/28/2021   Chronic pain of both knees 06/28/2021   Weight gain 02/08/2021   Family history of thyroid  disease 02/08/2021   Gastroesophageal reflux disease 11/29/2020   Nonallopathic lesion of lumbosacral region 03/01/2019   Nonallopathic lesion of cervical region 03/01/2019   Nonallopathic lesion of sacral region 03/01/2019   Nonallopathic lesion of thoracic region 03/01/2019   Nonallopathic lesion of rib cage 03/01/2019   Vitamin D  deficiency 05/08/2018   Mixed stress and urge urinary incontinence 02/07/2016   Carpal tunnel syndrome 01/03/2016   Polyarthralgia 01/03/2016   Bipolar depression (HCC) 05/17/2014   Chronic night sweats 06/26/2013   Obesity without serious comorbidity 01/25/2012    Past Medical History:  Diagnosis Date   Anxiety    Arthritis    Carpal tunnel syndrome    Depression    GERD (gastroesophageal reflux  disease)    Hammer toe 04/2013   bilateral 4th and 5th    Ingrown left big toenail 04/2013   Mental disorder    Neuromuscular disorder (HCC)    Post term pregnancy over 40 weeks 09/21/2015   SVD (spontaneous vaginal delivery) 09/22/2015   UTI (lower urinary tract infection)     Past Surgical History:  Procedure Laterality Date   BREAST LIFT Bilateral    CARPAL TUNNEL RELEASE     CORRECTION HAMMER TOE Bilateral    CRYOABLATION  2004   CYSTOSCOPY WITH HYDRODISTENSION AND BIOPSY  07/28/2004   with urethral dilatation   DILATION AND CURETTAGE OF UTERUS     DILATION AND EVACUATION  12/21/2011   Procedure: DILATATION AND EVACUATION;  Surgeon: Davia Erps, MD;  Location: [facility];  Service: Gynecology;  Laterality: N/A;  first trimester    ESOPHAGEAL MANOMETRY N/A 02/10/2021   Procedure: ESOPHAGEAL MANOMETRY (EM);  Surgeon: Sergio Dandy, MD;  Location: [facility];  Service: Endoscopy;  Laterality: N/A;   HAMMER TOE SURGERY Bilateral 04/20/2013   Procedure: BILATERAL HAMMER TOE REPAIR AND EXCISION NAIL PERMANENT LEFT FIRST;  Surgeon: Raelyn Bulls, DPM;  Location: [facility];  Service: Podiatry;  Laterality: Bilateral;  Hammer toe repair fourth and fifth toes bilateral feet with screw placement fourth toes and partial nail excision left great toe   HYSTEROSALPINGOGRAM     MASS EXCISION Left 03/27/2022   Procedure: EXCISION SOFT TISSUE MASS x2, LEFT UPPER ARM;  Surgeon: Oralee Billow, MD;  Location: [facility];  Service: General;  Laterality: Left;  LMA   REDUCTION MAMMAPLASTY Bilateral 2020   TOE SURGERY     ingrown toenail   WISDOM TOOTH EXTRACTION      Social History   Socioeconomic History   Marital status: Single    Spouse name: Not on file   Number of children: 1   Years of education: Not on file   Highest education level: Not on file  Occupational History   Occupation: Homemaker  Tobacco Use   Smoking status: Never     Passive exposure: Never   Smokeless tobacco: Never  Vaping Use   Vaping status: Never Used  Substance and Sexual Activity   Alcohol use: Not Currently    Comment: occasionally   Drug use: Never   Sexual activity: Not Currently    Birth control/protection: I.U.D.  Other Topics Concern   Not on file  Social History Narrative   Not on file   Social Drivers of Health   Financial Resource Strain: Low Risk  (10/11/2021)   Overall Financial Resource Strain (CARDIA)    Difficulty of Paying Living Expenses: Not hard at all  Food Insecurity: No Food Insecurity (10/11/2021)   Hunger Vital Sign    Worried About Running Out of Food in the Last Year: Never true    Ran Out of Food in the Last Year: Never true  Transportation Needs: No Transportation Needs (10/11/2021)   PRAPARE - Administrator, Civil Service (Medical): No    Lack of Transportation (Non-Medical): No  Physical Activity: Sufficiently Active (10/11/2021)   Exercise Vital Sign    Days of Exercise per Week: 3 days    Minutes of Exercise per Session: 60 min  Stress: No Stress Concern Present (10/11/2021)   [employer] of Occupational Health - Occupational Stress Questionnaire    Feeling of Stress : Not at all  Social Connections: Unknown (10/11/2021)   Social Connection and Isolation Panel [NHANES]    Frequency of Communication with Friends and Family: More than three times a week    Frequency of Social Gatherings with Friends and Family: More than three times a week    Attends Religious Services: More than 4 times per year    Active Member of [employer] or Organizations: No    Attends Banker Meetings: Never    Marital Status: Patient declined  Catering manager Violence: Not At Risk (10/11/2021)   Humiliation, Afraid, Rape, and Kick questionnaire    Fear of Current or Ex-Partner: No    Emotionally Abused: No    Physically Abused: No    Sexually Abused: No    Family History  Problem  Relation Age of Onset   Asthma Mother    Seizures Mother    Arthritis Mother    Lung cancer Father    Thyroid  disease Maternal Grandmother    Heart disease Paternal Grandmother        2 CABG  Lung cancer Paternal Grandfather      Review of Systems  Constitutional: Negative.  Negative for chills and fever.  HENT: Negative.  Negative for congestion and sore throat.   Respiratory: Negative.  Negative for cough and shortness of breath.   Cardiovascular: Negative.  Negative for chest pain and palpitations.  Gastrointestinal:  Negative for abdominal pain, diarrhea, nausea and vomiting.  Genitourinary: Negative.  Negative for dysuria and hematuria.  Skin: Negative.  Negative for rash.  Neurological: Negative.  Negative for dizziness and headaches.  All other systems reviewed and are negative.   Today's Vitals   04/25/23 1556  BP: 132/88  Pulse: 84  Temp: 98.9 F (37.2 C)  TempSrc: Oral  SpO2: 98%  Weight: 167 lb (75.8 kg)  Height: 5' (1.524 m)   Body mass index is 32.61 kg/m.   Physical Exam Vitals reviewed.  Constitutional:      Appearance: Normal appearance.  HENT:     Head: Normocephalic.     Mouth/Throat:     Mouth: Mucous membranes are moist.     Pharynx: Oropharynx is clear.  Eyes:     Extraocular Movements: Extraocular movements intact.     Conjunctiva/sclera: Conjunctivae normal.     Pupils: Pupils are equal, round, and reactive to light.  Cardiovascular:     Rate and Rhythm: Normal rate and regular rhythm.     Pulses: Normal pulses.     Heart sounds: Normal heart sounds.  Pulmonary:     Effort: Pulmonary effort is normal.     Breath sounds: Normal breath sounds.  Abdominal:     General: There is no distension.     Palpations: Abdomen is soft.     Tenderness: There is no abdominal tenderness.  Musculoskeletal:     Cervical back: No tenderness.  Lymphadenopathy:     Cervical: No cervical adenopathy.  Skin:    General: Skin is warm and dry.      Capillary Refill: Capillary refill takes less than 2 seconds.  Neurological:     General: No focal deficit present.     Mental Status: She is alert and oriented to person, place, and time.  Psychiatric:        Mood and Affect: Mood normal.        Behavior: Behavior normal.      ASSESSMENT & PLAN: A total of 44 minutes was spent with the patient and counseling/coordination of care regarding preparing for this visit, review of most recent office visit notes, review of multiple chronic medical conditions and their management, review of all medications, review of most recent bloodwork results, review of health maintenance items, education on nutrition, prognosis, documentation, and need for follow up.   Problem List Items Addressed This Visit       Cardiovascular and Mediastinum   Essential hypertension   BP Readings from Last 3 Encounters:  04/25/23 132/88  03/12/23 120/78  01/28/23 122/80  Well-controlled hypertension Continue lisinopril  20 mg daily       Relevant Orders   TSH   Hemoglobin A1c   Vitamin B12   VITAMIN D  25 Hydroxy (Vit-D Deficiency, Fractures)   CBC with Differential/Platelet   Comprehensive metabolic panel with GFR     Digestive   Gastroesophageal reflux disease   Clinically stable and asymptomatic Not taking medication at present time      Relevant Orders   TSH   Hemoglobin A1c   Vitamin B12   VITAMIN D  25 Hydroxy (Vit-D Deficiency, Fractures)  CBC with Differential/Platelet   Comprehensive metabolic panel with GFR     Other   Bipolar depression (HCC)   Clinical stable Needs follow-up with her psychiatrist Presently taking Wellbutrin and duloxetine      Family history of thyroid  disease   Thyroid  panel with TSH done today. Clinically euthyroid.      Vasomotor symptoms due to menopause   Much improved with Veozah  45 mg daily       Prediabetes   Relevant Orders   TSH   Hemoglobin A1c   Vitamin B12   VITAMIN D  25 Hydroxy (Vit-D  Deficiency, Fractures)   CBC with Differential/Platelet   Comprehensive metabolic panel with GFR   Excessive gas - Primary   Most likely nutritional. Differential diagnosis discussed Clinically stable.  No red flag signs or symptoms Benign abdominal examination Advised to take over-the-counter simethicone  2-3 times a day for the next 2 weeks Recently started on metformin  for prediabetes. Most likely related to metformin .  Recommend to stop it. Recommend to also stop Synthroid .      Relevant Orders   TSH   Hemoglobin A1c   Vitamin B12   VITAMIN D  25 Hydroxy (Vit-D Deficiency, Fractures)   CBC with Differential/Platelet   Comprehensive metabolic panel with GFR   Patient Instructions  Abdominal Bloating: What to Know  Belly bloating is when your belly may feel full, tight, or painful. It may also look bigger or swollen. This is also called abdominal bloating. Common causes of belly bloating include: Swallowing air. Trouble pooping (constipation). Problems digesting food. Lactose intolerance. This means you can't digest lactose normally, a natural sugar in dairy. Celiac disease. This means you can't digest gluten, a protein found in wheat). Slow movement of food in the stomach and small intestine. Eating too much. Irritable bowel syndrome (IBS). This affects the large intestine. Gastroesophageal reflux disease (GERD). This is when stomach acid goes back into the throat. Urinary retention. This is when the bladder can't be emptied all the way. Follow these instructions at home: Eating and drinking Avoid eating too much. Try not to swallow air while talking or eating. Avoid eating while lying down. Avoid foods and drinks that may trigger your bloating. Depending on the cause this may include: Foods that cause gas, such as broccoli, cabbage, cauliflower, and baked beans. Fizzy, or carbonated, drinks. Hard candy. Chewing gum. Dairy. Medicines Take your medicines only as  told. Ask your provider if you should take a probiotic. Probiotics have good bacteria or yeast that can help with digestion. General instructions Exercise regularly to help with bloating from gas and any trouble with pooping. You may need to take steps to help treat or prevent trouble pooping, such as: Taking medicines to help you poop. Eating foods high in fiber, like beans, whole grains, and fresh fruits and vegetables. Drinking more fluids as told. Contact a health care provider if: You throw up or feel like you may throw up. You have watery poop, or diarrhea, that doesn't stop. You have belly pain that gets worse and medicines don't help. You are gaining or losing weight unexpectedly. Get help right away if: You have chest pain. You have trouble breathing. You feel short of breath. You have trouble peeing. You have dark pee or pee that smells bad. You have blood in your poop or dark, tarry poop. These symptoms may be an emergency. Call 911 right away. Do not wait to see if the symptoms will go away. Do not drive yourself to the  hospital. This information is not intended to replace advice given to you by your health care provider. Make sure you discuss any questions you have with your health care provider. Document Revised: 08/08/2022 Document Reviewed: 08/08/2022 Elsevier Patient Education  2024 Elsevier Inc.      Maryagnes Small, MD [facility]

## 2023-04-25 NOTE — Assessment & Plan Note (Signed)
 Clinical stable Needs follow-up with her psychiatrist Presently taking Wellbutrin and duloxetine

## 2023-04-25 NOTE — Assessment & Plan Note (Signed)
Clinically stable and asymptomatic Not taking medication at present time

## 2023-04-25 NOTE — Patient Instructions (Signed)
 Abdominal Bloating: What to Know  Belly bloating is when your belly may feel full, tight, or painful. It may also look bigger or swollen. This is also called abdominal bloating. Common causes of belly bloating include: Swallowing air. Trouble pooping (constipation). Problems digesting food. Lactose intolerance. This means you can't digest lactose normally, a natural sugar in dairy. Celiac disease. This means you can't digest gluten, a protein found in wheat). Slow movement of food in the stomach and small intestine. Eating too much. Irritable bowel syndrome (IBS). This affects the large intestine. Gastroesophageal reflux disease (GERD). This is when stomach acid goes back into the throat. Urinary retention. This is when the bladder can't be emptied all the way. Follow these instructions at home: Eating and drinking Avoid eating too much. Try not to swallow air while talking or eating. Avoid eating while lying down. Avoid foods and drinks that may trigger your bloating. Depending on the cause this may include: Foods that cause gas, such as broccoli, cabbage, cauliflower, and baked beans. Fizzy, or carbonated, drinks. Hard candy. Chewing gum. Dairy. Medicines Take your medicines only as told. Ask your provider if you should take a probiotic. Probiotics have good bacteria or yeast that can help with digestion. General instructions Exercise regularly to help with bloating from gas and any trouble with pooping. You may need to take steps to help treat or prevent trouble pooping, such as: Taking medicines to help you poop. Eating foods high in fiber, like beans, whole grains, and fresh fruits and vegetables. Drinking more fluids as told. Contact a health care provider if: You throw up or feel like you may throw up. You have watery poop, or diarrhea, that doesn't stop. You have belly pain that gets worse and medicines don't help. You are gaining or losing weight unexpectedly. Get help  right away if: You have chest pain. You have trouble breathing. You feel short of breath. You have trouble peeing. You have dark pee or pee that smells bad. You have blood in your poop or dark, tarry poop. These symptoms may be an emergency. Call 911 right away. Do not wait to see if the symptoms will go away. Do not drive yourself to the hospital. This information is not intended to replace advice given to you by your health care provider. Make sure you discuss any questions you have with your health care provider. Document Revised: 08/08/2022 Document Reviewed: 08/08/2022 Elsevier Patient Education  2024 [employer].

## 2023-04-26 LAB — COMPREHENSIVE METABOLIC PANEL WITH GFR
ALT: 18 U/L (ref 0–35)
AST: 22 U/L (ref 0–37)
Albumin: 4.3 g/dL (ref 3.5–5.2)
Alkaline Phosphatase: 65 U/L (ref 39–117)
BUN: 10 mg/dL (ref 6–23)
CO2: 32 meq/L (ref 19–32)
Calcium: 9.8 mg/dL (ref 8.4–10.5)
Chloride: 98 meq/L (ref 96–112)
Creatinine, Ser: 0.75 mg/dL (ref 0.40–1.20)
GFR: 94.79 mL/min (ref >60.00)
Glucose, Bld: 107 mg/dL — ABNORMAL HIGH (ref 70–99)
Potassium: 4.5 meq/L (ref 3.5–5.1)
Sodium: 138 meq/L (ref 135–145)
Total Bilirubin: 0.5 mg/dL (ref 0.2–1.2)
Total Protein: 7.7 g/dL (ref 6.0–8.3)

## 2023-04-26 LAB — CBC WITH DIFFERENTIAL/PLATELET
Basophils Absolute: 0.1 10*3/uL (ref 0.0–0.1)
Basophils Relative: 1.1 % (ref 0.0–3.0)
Eosinophils Absolute: 0.1 10*3/uL (ref 0.0–0.7)
Eosinophils Relative: 0.7 % (ref 0.0–5.0)
HCT: 40.9 % (ref 36.0–46.0)
Hemoglobin: 13.8 g/dL (ref 12.0–15.0)
Lymphocytes Relative: 19.1 % (ref 12.0–46.0)
Lymphs Abs: 1.5 10*3/uL (ref 0.7–4.0)
MCHC: 33.8 g/dL (ref 30.0–36.0)
MCV: 93.5 fl (ref 78.0–100.0)
Monocytes Absolute: 0.5 10*3/uL (ref 0.1–1.0)
Monocytes Relative: 6.6 % (ref 3.0–12.0)
Neutro Abs: 5.6 10*3/uL (ref 1.4–7.7)
Neutrophils Relative %: 72.5 % (ref 43.0–77.0)
Platelets: 312 10*3/uL (ref 150.0–400.0)
RBC: 4.37 Mil/uL (ref 3.87–5.11)
RDW: 12.9 % (ref 11.5–15.5)
WBC: 7.8 10*3/uL (ref 4.0–10.5)

## 2023-04-26 LAB — VITAMIN B12: Vitamin B-12: 343 pg/mL (ref 211–911)

## 2023-04-26 LAB — VITAMIN D 25 HYDROXY (VIT D DEFICIENCY, FRACTURES): VITD: 21.43 ng/mL — ABNORMAL LOW (ref 30.00–100.00)

## 2023-04-26 LAB — HEMOGLOBIN A1C: Hgb A1c MFr Bld: 5.8 % (ref 4.6–6.5)

## 2023-04-26 LAB — TSH: TSH: 0.81 u[IU]/mL (ref 0.35–5.50)

## 2023-04-30 ENCOUNTER — Encounter: Payer: Self-pay | Admitting: Emergency Medicine

## 2023-05-02 ENCOUNTER — Encounter: Payer: Self-pay | Admitting: Radiology

## 2023-05-02 ENCOUNTER — Telehealth: Payer: Self-pay | Admitting: Emergency Medicine

## 2023-05-02 NOTE — Telephone Encounter (Unsigned)
 Copied from CRM [id number]. Topic: Clinical - Medication Refill >> May 02, 2023  8:43 AM Vivian Z wrote: Most Recent Primary Care Visit:  Provider: Elvira Hammersmith  Department: [facility]  Visit Type: ACUTE  Date: 04/25/2023  Medication: Weigh loss medication.   Patient called with Marco Severs, insurance representative from [employer] on the line to request a prescription for weight loss medication. Marco Severs, stated that they need the prescription to be sent first to be able to do a prior authorization and see if it will be covered by the insurance.   Has the patient contacted their pharmacy? No (Agent: If no, request that the patient contact the pharmacy for the refill. If patient does not wish to contact the pharmacy document the reason why and proceed with request.) (Agent: If yes, when and what did the pharmacy advise?)  Is this the correct pharmacy for this prescription? Yes If no, delete pharmacy and type the correct one.  This is the patient's preferred pharmacy:  [facility] [address], [place] - [address] [place] [address] Phone: [phone] Fax: [phone]  Has the prescription been filled recently? No  Is the patient out of the medication? Yes  Has the patient been seen for an appointment in the last year OR does the patient have an upcoming appointment? Yes  Can we respond through MyChart? Yes  Agent: Please be advised that Rx refills may take up to 3 business days. We ask that you follow-up with your pharmacy.

## 2023-05-06 ENCOUNTER — Other Ambulatory Visit (HOSPITAL_COMMUNITY): Payer: Self-pay

## 2023-05-06 NOTE — Telephone Encounter (Signed)
 Spoke with patient and she is requesting a appeal for the Rybelsus  and something sent in for her constipation

## 2023-05-09 NOTE — Progress Notes (Deleted)
  Melissa [name] [address] [place] [address] Phone: [phone] Subjective:    I'm seeing this patient by the request  of:  Melissa [name], Melissa Margo, Melissa [name]  CC:   [id number]  Melissa [name] is a 48 y.o. female coming in with complaint of back and neck pain. OMT 03/12/2023. Patient states   Medications patient has been prescribed: None  Taking:         Reviewed prior external information including notes and imaging from previsou exam, outside providers and external EMR if available.   As well as notes that were available from care everywhere and other healthcare systems.  Past medical history, social, surgical and family history all reviewed in electronic medical record.  No pertanent information unless stated regarding to the chief complaint.   Past Medical History:  Diagnosis Date   Anxiety    Arthritis    Carpal tunnel syndrome    Depression    GERD (gastroesophageal reflux disease)    Hammer toe 04/2013   bilateral 4th and 5th    Ingrown left big toenail 04/2013   Mental disorder    Neuromuscular disorder (HCC)    Post term pregnancy over 40 weeks 09/21/2015   SVD (spontaneous vaginal delivery) 09/22/2015   UTI (lower urinary tract infection)     Allergies  Allergen Reactions   Bactrim  [Sulfamethoxazole -Trimethoprim ]      Review of Systems:  No headache, visual changes, nausea, vomiting, diarrhea, constipation, dizziness, abdominal pain, skin rash, fevers, chills, night sweats, weight loss, swollen lymph nodes, body aches, joint swelling, chest pain, shortness of breath, mood changes. POSITIVE muscle aches  Objective  There were no vitals taken for this visit.   General: No apparent distress alert and oriented x3 mood and affect normal, dressed appropriately.  HEENT: Pupils equal, extraocular movements intact  Respiratory: Patient's speak in full sentences and does not appear short of breath  Cardiovascular: No lower  extremity edema, non tender, no erythema  Gait MSK:  Back   Osteopathic findings  C2 flexed rotated and side bent right C6 flexed rotated and side bent left T3 extended rotated and side bent right inhaled rib T9 extended rotated and side bent left L2 flexed rotated and side bent right Sacrum right on right       Assessment and Plan:  No problem-specific Assessment & Plan notes found for this encounter.    Nonallopathic problems  Decision today to treat with OMT was based on Physical Exam  After verbal consent patient was treated with HVLA, ME, FPR techniques in cervical, rib, thoracic, lumbar, and sacral  areas  Patient tolerated the procedure well with improvement in symptoms  Patient given exercises, stretches and lifestyle modifications  See medications in patient instructions if given  Patient will follow up in 4-8 weeks             Note: This dictation was prepared with Dragon dictation along with smaller phrase technology. Any transcriptional errors that result from this process are unintentional.

## 2023-05-12 ENCOUNTER — Other Ambulatory Visit: Payer: Self-pay | Admitting: Emergency Medicine

## 2023-05-13 ENCOUNTER — Telehealth: Payer: Self-pay | Admitting: Emergency Medicine

## 2023-05-13 ENCOUNTER — Other Ambulatory Visit (HOSPITAL_COMMUNITY): Payer: Self-pay

## 2023-05-13 NOTE — Telephone Encounter (Signed)
 Copied from CRM [id number]. Topic: Clinical - Medication Question >> May 13, 2023  1:20 PM Taleah C wrote: Reason for CRM: [facility] called and stated that they are needing a prior auth for Rybelsus . Their fax # is : [phone], tele: [phone].

## 2023-05-14 ENCOUNTER — Other Ambulatory Visit (HOSPITAL_COMMUNITY): Payer: Self-pay

## 2023-05-14 ENCOUNTER — Telehealth: Payer: Self-pay

## 2023-05-14 ENCOUNTER — Ambulatory Visit: Admitting: Family Medicine

## 2023-05-14 NOTE — Telephone Encounter (Signed)
 Pharmacy Patient Advocate Encounter   Received notification from Pt Calls Messages that prior authorization for Rybelsus  is required/requested.   Insurance verification completed.   The patient is insured through [facility] .   Rybelsus  is approved exclusively as an adjunct to diet and exercise to improve glycemic control in adults with type 2 diabetes mellitus. A review of patient's medical chart reveals no documented diagnosis of type 2 diabetes or an A1C indicative of diabetes. Therefore, they do not currently meet the criteria for prior authorization or an appeal of this medication.   Per previous requests, the prior auth was denied due to drugs used for anorexia, weight loss, or weight gain are excluded from coverage under Medicare rules. Full denial letter uploaded to the media tab.

## 2023-05-15 NOTE — Telephone Encounter (Signed)
 Received appeal forms will have provider review and complete

## 2023-05-22 ENCOUNTER — Other Ambulatory Visit (HOSPITAL_COMMUNITY): Payer: Self-pay

## 2023-05-22 NOTE — Discharge Instructions (Signed)

## 2023-05-23 ENCOUNTER — Ambulatory Visit
Admission: RE | Admit: 2023-05-23 | Discharge: 2023-05-23 | Disposition: A | Discharge status: Home / Self Care | Source: Ambulatory Visit | Attending: Family Medicine | Admitting: Family Medicine

## 2023-05-23 DIAGNOSIS — M50123 Cervical disc disorder at C6-C7 level with radiculopathy: Secondary | ICD-10-CM | POA: Diagnosis not present

## 2023-05-23 DIAGNOSIS — M50122 Cervical disc disorder at C5-C6 level with radiculopathy: Secondary | ICD-10-CM | POA: Diagnosis not present

## 2023-05-23 DIAGNOSIS — M5412 Radiculopathy, cervical region: Secondary | ICD-10-CM

## 2023-05-23 MED ORDER — IOPAMIDOL (ISOVUE-M 300) INJECTION 61%
1.0000 mL | Freq: Once | INTRAMUSCULAR | Status: AC
  Administered 2023-05-23: 1 mL via EPIDURAL

## 2023-05-23 MED ORDER — TRIAMCINOLONE ACETONIDE 40 MG/ML IJ SUSP (RADIOLOGY)
60.0000 mg | Freq: Once | INTRAMUSCULAR | Status: AC
  Administered 2023-05-23: 60 mg via EPIDURAL

## 2023-05-29 DIAGNOSIS — L218 Other seborrheic dermatitis: Secondary | ICD-10-CM | POA: Diagnosis not present

## 2023-05-29 DIAGNOSIS — L818 Other specified disorders of pigmentation: Secondary | ICD-10-CM | POA: Diagnosis not present

## 2023-05-29 DIAGNOSIS — L74510 Primary focal hyperhidrosis, axilla: Secondary | ICD-10-CM | POA: Diagnosis not present

## 2023-06-13 NOTE — Telephone Encounter (Signed)
 Error

## 2023-06-14 ENCOUNTER — Encounter: Payer: Self-pay | Admitting: Family Medicine

## 2023-06-14 ENCOUNTER — Encounter: Payer: Self-pay | Admitting: Emergency Medicine

## 2023-06-17 ENCOUNTER — Ambulatory Visit (INDEPENDENT_AMBULATORY_CARE_PROVIDER_SITE_OTHER)

## 2023-06-17 ENCOUNTER — Other Ambulatory Visit: Payer: Self-pay

## 2023-06-17 VITALS — Ht 60.0 in | Wt 167.0 lb

## 2023-06-17 DIAGNOSIS — M5412 Radiculopathy, cervical region: Secondary | ICD-10-CM

## 2023-06-17 DIAGNOSIS — Z0000 Encounter for general adult medical examination without abnormal findings: Secondary | ICD-10-CM | POA: Diagnosis not present

## 2023-06-17 DIAGNOSIS — Z1211 Encounter for screening for malignant neoplasm of colon: Secondary | ICD-10-CM

## 2023-06-17 DIAGNOSIS — Z1212 Encounter for screening for malignant neoplasm of rectum: Secondary | ICD-10-CM

## 2023-06-17 NOTE — Progress Notes (Signed)
 Subjective:   Melissa [name] is a 48 y.o. who presents for a Medicare Wellness preventive visit.  As a reminder, Annual Wellness Visits don't include a physical exam, and some assessments may be limited, especially if this visit is performed virtually. We may recommend an in-person follow-up visit with your provider if needed.  Visit Complete: Virtual I connected with  Boris Byars on 06/17/23 by a audio enabled telemedicine application and verified that I am speaking with the correct person using two identifiers.  Patient Location: Home  Provider Location: Home Office  I discussed the limitations of evaluation and management by telemedicine. The patient expressed understanding and agreed to proceed.  Vital Signs: Because this visit was a virtual/telehealth visit, some criteria may be missing or patient reported. Any vitals not documented were not able to be obtained and vitals that have been documented are patient reported.  VideoDeclined- This patient declined Librarian, academic. Therefore the visit was completed with audio only.  Persons Participating in Visit: Patient.  AWV Questionnaire: No: Patient Medicare AWV questionnaire was not completed prior to this visit.  Cardiac Risk Factors include: advanced age (>33men, >77 women);hypertension;obesity (BMI >30kg/m2)     Objective:    Today's Vitals   06/17/23 0957  Weight: 167 lb (75.8 kg)  Height: 5' (1.524 m)  PainSc: 10-Worst pain ever   Body mass index is 32.61 kg/m.     06/17/2023   10:10 AM 03/27/2022    9:42 AM 10/11/2021    2:58 PM 08/29/2020    2:27 PM 09/21/2015    3:34 PM 06/17/2014   10:50 AM 05/17/2014   11:57 AM  Advanced Directives  Does Patient Have a Medical Advance Directive? Yes Yes Yes Yes No  No  No   Type of Advance Directive Living will Living will Healthcare Power of [place];Living will Living will     Does patient want to make changes to medical advance  directive?   No - Patient declined      Copy of Healthcare Power of Attorney in Chart? No - copy requested  No - copy requested      Would patient like information on creating a medical advance directive?     No - patient declined information  No - patient declined information  No - patient declined information      Data saved with a previous flowsheet row definition    Current Medications (verified) Outpatient Encounter Medications as of 06/17/2023  Medication Sig   ACCU-CHEK GUIDE TEST test strip USE AS DIRECTED ONCE DAILY AS NEEDED   Blood Glucose Monitoring Suppl DEVI 1 each by Does not apply route daily as needed. May substitute to any manufacturer covered by patient's insurance.   buPROPion (WELLBUTRIN XL) 150 MG 24 hr tablet Take 450 mg by mouth daily.   DULoxetine (CYMBALTA) 60 MG capsule Take 60 mg by mouth daily.   estradiol (CLIMARA - DOSED IN MG/24 HR) 0.05 mg/24hr patch Place 0.05 mg onto the skin once a week.   estradiol (VIVELLE-DOT) 0.1 MG/24HR patch Place 1 patch onto the skin 2 (two) times a week.   fexofenadine  (ALLEGRA  ALLERGY ) 180 MG tablet Take 1 tablet (180 mg total) by mouth daily. At bedtime for drainage   hydrOXYzine  (ATARAX ) 25 MG tablet TAKE 1 TABLET BY MOUTH AT BEDTIME AS NEEDED.   hydrOXYzine  (ATARAX ) 25 MG tablet Take 1 tablet by mouth at bedtime as needed.   Lancets Misc. MISC 1 each by Does not  apply route daily as needed. May substitute to any manufacturer covered by patient's insurance.   levonorgestrel (MIRENA) 20 MCG/DAY IUD 1 each by Intrauterine route once.   lisinopril  (ZESTRIL ) 20 MG tablet TAKE 1 TABLET BY MOUTH EVERY DAY   metFORMIN  (GLUCOPHAGE ) 500 MG tablet Take 1 tablet (500 mg total) by mouth 2 (two) times daily with a meal.   Olopatadine  HCl (PATADAY ) 0.2 % SOLN Place 1 drop into both eyes daily as needed.   RYALTRIS  665-25 MCG/ACT SUSP Place 2 sprays into the nose in the morning and at bedtime.   Salicylic Acid  3 % SHAM Use daily as directed  for dry scalp   selenium  sulfide (SELSUN ) 2.5 % shampoo USE LOTION EXTERNALLY  TO AFFECTED AREA ONCE DAILY AS NEEDED IRRITATION   tranexamic acid (LYSTEDA) 650 MG TABS tablet Take 650 mg by mouth daily.   triamcinolone  cream (KENALOG ) 0.1 % Apply 1 Application topically as directed.   UNABLE TO FIND    VEOZAH  45 MG TABS TAKE 1 TABLET BY MOUTH EVERY DAY   XIIDRA 5 % SOLN INSTILL 1 DROP TWICE DAILY INTO EACH EYE   No facility-administered encounter medications on file as of 06/17/2023.    Allergies (verified) Bactrim  [sulfamethoxazole -trimethoprim ]   History: Past Medical History:  Diagnosis Date   Anxiety    Arthritis    Carpal tunnel syndrome    Depression    GERD (gastroesophageal reflux disease)    Hammer toe 04/2013   bilateral 4th and 5th    Ingrown left big toenail 04/2013   Mental disorder    Neuromuscular disorder (HCC)    Post term pregnancy over 40 weeks 09/21/2015   SVD (spontaneous vaginal delivery) 09/22/2015   UTI (lower urinary tract infection)    Past Surgical History:  Procedure Laterality Date   BREAST LIFT Bilateral    CARPAL TUNNEL RELEASE     CORRECTION HAMMER TOE Bilateral    CRYOABLATION  2004   CYSTOSCOPY WITH HYDRODISTENSION AND BIOPSY  07/28/2004   with urethral dilatation   DILATION AND CURETTAGE OF UTERUS     DILATION AND EVACUATION  12/21/2011   Procedure: DILATATION AND EVACUATION;  Surgeon: Davia Erps, MD;  Location: [facility];  Service: Gynecology;  Laterality: N/A;  first trimester    ESOPHAGEAL MANOMETRY N/A 02/10/2021   Procedure: ESOPHAGEAL MANOMETRY (EM);  Surgeon: Sergio Dandy, MD;  Location: [facility];  Service: Endoscopy;  Laterality: N/A;   HAMMER TOE SURGERY Bilateral 04/20/2013   Procedure: BILATERAL HAMMER TOE REPAIR AND EXCISION NAIL PERMANENT LEFT FIRST;  Surgeon: Raelyn Bulls, DPM;  Location: [facility];  Service: Podiatry;  Laterality: Bilateral;  Hammer toe repair fourth and fifth  toes bilateral feet with screw placement fourth toes and partial nail excision left great toe   HYSTEROSALPINGOGRAM     MASS EXCISION Left 03/27/2022   Procedure: EXCISION SOFT TISSUE MASS x2, LEFT UPPER ARM;  Surgeon: Oralee Billow, MD;  Location: [facility];  Service: General;  Laterality: Left;  LMA   REDUCTION MAMMAPLASTY Bilateral 2020   TOE SURGERY     ingrown toenail   WISDOM TOOTH EXTRACTION     Family History  Problem Relation Age of Onset   Asthma Mother    Seizures Mother    Arthritis Mother    Lung cancer Father    Thyroid  disease Maternal Grandmother    Heart disease Paternal Grandmother        2 CABG   Lung  cancer Paternal Grandfather    Social History   Socioeconomic History   Marital status: Single    Spouse name: Not on file   Number of children: 1   Years of education: Not on file   Highest education level: Not on file  Occupational History   Occupation: Homemaker   Occupation: disabled  Tobacco Use   Smoking status: Never    Passive exposure: Never   Smokeless tobacco: Never  Vaping Use   Vaping status: Never Used  Substance and Sexual Activity   Alcohol use: Not Currently    Comment: occasionally   Drug use: Never   Sexual activity: Not Currently    Birth control/protection: I.U.D.  Other Topics Concern   Not on file  Social History Narrative   Lives with her daughter/2025   Social Drivers of Health   Financial Resource Strain: Medium Risk (06/17/2023)   Overall Financial Resource Strain (CARDIA)    Difficulty of Paying Living Expenses: Somewhat hard  Food Insecurity: Food Insecurity Present (06/17/2023)   Hunger Vital Sign    Worried About Running Out of Food in the Last Year: Sometimes true    Ran Out of Food in the Last Year: Sometimes true  Transportation Needs: No Transportation Needs (06/17/2023)   PRAPARE - Administrator, Civil Service (Medical): No    Lack of Transportation (Non-Medical): No  Physical  Activity: Insufficiently Active (06/17/2023)   Exercise Vital Sign    Days of Exercise per Week: 3 days    Minutes of Exercise per Session: 30 min  Stress: Stress Concern Present (06/17/2023)   [employer] of Occupational Health - Occupational Stress Questionnaire    Feeling of Stress: Rather much  Social Connections: Moderately Integrated (06/17/2023)   Social Connection and Isolation Panel    Frequency of Communication with Friends and Family: More than three times a week    Frequency of Social Gatherings with Friends and Family: Once a week    Attends Religious Services: More than 4 times per year    Active Member of [employer] or Organizations: Yes    Attends Banker Meetings: 1 to 4 times per year    Marital Status: Never married    Tobacco Counseling Counseling given: Not Answered    Clinical Intake:  Pre-visit preparation completed: Yes  Pain : 0-10 Pain Score: 10-Worst pain ever Pain Type: Chronic pain Pain Location: Neck (rt arm and hand) Pain Descriptors / Indicators: Aching, Discomfort Pain Onset: More than a month ago Pain Frequency: Constant Pain Relieving Factors: get an injection from Eloise Hake, MD  Pain Relieving Factors: get an injection from Eloise Hake, MD  BMI - recorded: 32.61 Nutritional Status: BMI > 30  Obese Nutritional Risks: None Diabetes: No  Lab Results  Component Value Date   HGBA1C 5.8 04/25/2023   HGBA1C 5.3 05/21/2022   HGBA1C 5.6 02/08/2021     How often do you need to have someone help you when you read instructions, pamphlets, or other written materials from your doctor or pharmacy?: 2 - Rarely  Interpreter Needed?: No  Information entered by :: Zoi Devine, RMA   Activities of Daily Living     06/17/2023   10:07 AM  In your present state of health, do you have any difficulty performing the following activities:  Hearing? 0  Vision? 0  Difficulty concentrating or making decisions? 0  Walking or  climbing stairs? 0  Dressing or bathing? 0  Doing errands, shopping? 0  Preparing Food and eating ? N  Using the Toilet? N  In the past six months, have you accidently leaked urine? N  Do you have problems with loss of bowel control? N  Managing your Medications? N  Managing your Finances? N  Housekeeping or managing your Housekeeping? N    Patient Care Team: Elvira Hammersmith, MD as PCP - General (Internal Medicine) [facility], [facility] as Consulting Physician (Urology)   I have updated your Care Teams any recent Medical Services you may have received from other providers in the past year.     Assessment:   This is a routine wellness examination for Melissa [name].  Hearing/Vision screen Hearing Screening - Comments:: Denies hearing difficulties   Vision Screening - Comments:: Wears eyeglasses/ for reading/ [employer] [place]   Goals Addressed               This Visit's Progress     Patient Stated (pt-stated)        My goal is to lose 10 pounds.  Still working on it/2025       Depression Screen      06/17/2023   10:14 AM 04/25/2023    4:04 PM 01/28/2023    9:22 AM 10/04/2022   11:06 AM 05/21/2022    9:07 AM 10/11/2021    2:57 PM 06/28/2021    1:21 PM  PHQ 2/9 Scores  PHQ - 2 Score 3 0 0 0 0 0 0  PHQ- 9 Score 14          Fall Risk      06/17/2023   10:12 AM 04/25/2023    4:04 PM 01/28/2023    9:21 AM 10/04/2022   11:06 AM 05/21/2022    9:07 AM  Fall Risk   Falls in the past year? 0 0 0 0 0  Number falls in past yr: 0 0 0 0 0  Injury with Fall? 0 0 0 0 0  Risk for fall due to :  No Fall Risks No Fall Risks No Fall Risks No Fall Risks  Follow up Falls evaluation completed;Falls prevention discussed Falls evaluation completed Falls evaluation completed Falls evaluation completed Falls evaluation completed    MEDICARE RISK AT HOME:   Medicare Risk at Home Any stairs in or around the home?: Yes If so, are there any without handrails?:  No Home free of loose throw rugs in walkways, pet beds, electrical cords, etc?: Yes Adequate lighting in your home to reduce risk of falls?: Yes Life alert?: No Use of a cane, walker or w/c?: No Grab bars in the bathroom?: No Shower chair or bench in shower?: No Elevated toilet seat or a handicapped toilet?: No  TIMED UP AND GO:  Was the test performed?  No  Cognitive Function: Declined/Normal: No cognitive concerns noted by patient or family. Patient alert, oriented, able to answer questions appropriately and recall recent events. No signs of memory loss or confusion.        10/11/2021    2:59 PM  6CIT Screen  What Year? 0 points  What month? 0 points  What time? 0 points  Count back from 20 0 points  Months in reverse 0 points  Repeat phrase 0 points  Total Score 0 points    Immunizations Immunization History  Administered Date(s) Administered   Influenza,inj,Quad PF,6+ Mos 09/20/2020   PFIZER(Purple Top)SARS-COV-2 Vaccination 04/06/2019, 04/27/2019   PPD Test 06/26/2013   Rho (D) Immune Globulin  01/22/2011, 12/21/2011   Tdap  09/23/2015, 06/09/2021    Screening Tests Health Maintenance  Topic Date Due   Cervical Cancer Screening (HPV/Pap Cotest)  02/20/2019   Colonoscopy  Never done   Medicare Annual Wellness (AWV)  10/12/2022   DTaP/Tdap/Td (3 - Td or Tdap) 06/10/2031   Hepatitis C Screening  Completed   HIV Screening  Completed   HPV VACCINES  Aged Out   Meningococcal B Vaccine  Aged Out   INFLUENZA VACCINE  Discontinued   COVID-19 Vaccine  Discontinued    Health Maintenance  Health Maintenance Due  Topic Date Due   Cervical Cancer Screening (HPV/Pap Cotest)  02/20/2019   Colonoscopy  Never done   Medicare Annual Wellness (AWV)  10/12/2022   Health Maintenance Items Addressed: See Nurse Notes at the end of this note  Additional Screening:  Vision Screening: Recommended annual ophthalmology exams for early detection of glaucoma and other disorders  of the eye. Would you like a referral to an eye doctor? No    Dental Screening: Recommended annual dental exams for proper oral hygiene  Community Resource Referral / Chronic Care Management: CRR required this visit?  No   CCM required this visit?  No   Plan:    I have personally reviewed and noted the following in the patient's chart:   Medical and social history Use of alcohol, tobacco or illicit drugs  Current medications and supplements including opioid prescriptions. Patient is not currently taking opioid prescriptions. Functional ability and status Nutritional status Physical activity Advanced directives List of other physicians Hospitalizations, surgeries, and ER visits in previous 12 months Vitals Screenings to include cognitive, depression, and falls Referrals and appointments  In addition, I have reviewed and discussed with patient certain preventive protocols, quality metrics, and best practice recommendations. A written personalized care plan for preventive services as well as general preventive health recommendations were provided to patient.   Ellanie Oppedisano L Julianne Chamberlin, CMA   06/17/2023   After Visit Summary: (MyChart) Due to this being a telephonic visit, the after visit summary with patients personalized plan was offered to patient via MyChart   Notes: Nothing significant to report at this time.

## 2023-06-17 NOTE — Telephone Encounter (Signed)
 Way too many symptoms to evaluate online.  Needs office visit to discuss all of these and come up with a plan of action.  Thanks.

## 2023-06-17 NOTE — Patient Instructions (Signed)
 Melissa [name] , Thank you for taking time out of your busy schedule to complete your Annual Wellness Visit with me. I enjoyed our conversation and look forward to speaking with you again next year. I, as well as your care team,  appreciate your ongoing commitment to your health goals. Please review the following plan we discussed and let me know if I can assist you in the future. Your Game plan/ To Do List    Referrals: If you haven't heard from the office you've been referred to, please reach out to them at the phone provided.  [facility], : [address] [place] [address], [place], [place] [address], [phone].  Follow up Visits: Next Medicare AWV with our clinical staff: 06/17/2024.   Have you seen your provider in the last 6 months (3 months if uncontrolled diabetes)? Yes Next Office Visit with your provider: Patient has sent a message to schedule a visit.  Last office visit on 04/25/2023.  Clinician Recommendations:  Aim for 30 minutes of exercise or brisk walking, 6-8 glasses of water, and 5 servings of fruits and vegetables each day. You are due for a colonoscopy.      This is a list of the screening recommended for you and due dates:  Health Maintenance  Topic Date Due   Pap with HPV screening  02/20/2019   Colon Cancer Screening  Never done   Medicare Annual Wellness Visit  10/12/2022   DTaP/Tdap/Td vaccine (3 - Td or Tdap) 06/10/2031   Hepatitis C Screening  Completed   HIV Screening  Completed   HPV Vaccine  Aged Out   Meningitis B Vaccine  Aged Out   Flu Shot  Discontinued   COVID-19 Vaccine  Discontinued    Advanced directives: (Copy Requested) Please bring a copy of your health care power of attorney and living will to the office to be added to your chart at your convenience. You can mail to [facility] [address] [place], [place] [address] or email to [email or web address] Advance Care Planning is important because it:  [x]   Makes sure you receive the medical care that is consistent with your values, goals, and preferences  [x]  It provides guidance to your family and loved ones and reduces their decisional burden about whether or not they are making the right decisions based on your wishes.  Follow the link provided in your after visit summary or read over the paperwork we have mailed to you to help you started getting your Advance Directives in place. If you need assistance in completing these, please reach out to us  so that we can help you!  See attachments for Preventive Care and Fall Prevention Tips.

## 2023-06-24 NOTE — Discharge Instructions (Signed)

## 2023-06-25 ENCOUNTER — Inpatient Hospital Stay
Admission: RE | Admit: 2023-06-25 | Discharge: 2023-06-25 | Disposition: A | Discharge status: Home / Self Care | Source: Ambulatory Visit | Attending: Family Medicine | Admitting: Family Medicine

## 2023-06-27 NOTE — Progress Notes (Signed)
 Melissa [name], am serving as a scribe for Dr. Arthea [name]. Melissa [name] [name] [address] [place] [address] Phone: [phone] Subjective:   Melissa [name], am serving as a scribe for Dr. Arthea [name].  I'm seeing this patient by the request  of:  Melissa [name], Melissa Schanz, MD  CC: Back and neck pain follow-up  YEP:Dlagzrupcz  Melissa [name] is a 48 y.o. female coming in with complaint of back and neck pain. OMT 03/12/2023.  Was having exacerbation of the neck pain and is scheduled for a another epidural in the cervical region on the seventh of this month coming up in July.  Patient states that she has been the same as last visit. Pain throughout entire body. Weight gain creating more pressure in body. Epidural injections are helpful but is dependent on what physician administers injection.   Pain throughout entire spine and knees today. Open to discuss weight loss. Works out 3x a week.   Medications patient has been prescribed: None  Taking:         Reviewed prior external information including notes and imaging from previsou exam, outside providers and external EMR if available.   As well as notes that were available from care everywhere and other healthcare systems.  Past medical history, social, surgical and family history all reviewed in electronic medical record.  No pertanent information unless stated regarding to the chief complaint.   Past Medical History:  Diagnosis Date   Anxiety    Arthritis    Carpal tunnel syndrome    Depression    GERD (gastroesophageal reflux disease)    Hammer toe 04/2013   bilateral 4th and 5th    Ingrown left big toenail 04/2013   Mental disorder    Neuromuscular disorder (HCC)    Post term pregnancy over 40 weeks 09/21/2015   SVD (spontaneous vaginal delivery) 09/22/2015   UTI (lower urinary tract infection)     Allergies  Allergen Reactions   Bactrim  [Sulfamethoxazole -Trimethoprim ] Itching      Review of Systems:  No headache, visual changes, nausea, vomiting, diarrhea, constipation, dizziness, abdominal pain, skin rash, fevers, chills, night sweats, weight loss, swollen lymph nodes, body aches, joint swelling, chest pain, shortness of breath, mood changes. POSITIVE muscle aches  Objective  Blood pressure 110/74, height 5' (1.524 m), weight 173 lb (78.5 kg).   General: No apparent distress alert and oriented x3 mood and affect normal, dressed appropriately.  HEENT: Pupils equal, extraocular movements intact  Respiratory: Patient's speak in full sentences and does not appear short of breath  Cardiovascular: No lower extremity edema, non tender, no erythema  Gait relatively normal MSK:  Back does have some loss lordosis.  Some tenderness to palpation in the paraspinal musculature pain is out of proportion to the amount of palpation.  Mild increase in kyphosis of the upper back  Osteopathic findings  C2 flexed rotated and side bent right C6 flexed rotated and side bent left T3 extended rotated and side bent right inhaled rib T9 extended rotated and side bent left L2 flexed rotated and side bent right L3 flexed rotated and side bent left Sacrum right on right       Assessment and Plan:  Degenerative disc disease, cervical Known degenerative disc disease.  Patient has responded well to epidurals and osteopathic manipulation previously.  Toradol  and Depo-Medrol  given again today.  Discussed different medications.  Wants to avoid adding any other medication at this time.  Follow-up with me  again in 6 to 8 weeks    Nonallopathic problems  Decision today to treat with OMT was based on Physical Exam  After verbal consent patient was treated with HVLA, ME, FPR techniques in cervical, rib, thoracic, lumbar, and sacral  areas  Patient tolerated the procedure well with improvement in symptoms  Patient given exercises, stretches and lifestyle modifications  See  medications in patient instructions if given  Patient will follow up in 4-8 weeks    The above documentation has been reviewed and is accurate and complete Tawan Degroote M Vernal Rutan, DO          Note: This dictation was prepared with Dragon dictation along with smaller phrase technology. Any transcriptional errors that result from this process are unintentional.

## 2023-07-01 ENCOUNTER — Encounter: Payer: Self-pay | Admitting: Family Medicine

## 2023-07-01 ENCOUNTER — Ambulatory Visit (INDEPENDENT_AMBULATORY_CARE_PROVIDER_SITE_OTHER): Admitting: Family Medicine

## 2023-07-01 VITALS — BP 110/74 | Ht 60.0 in | Wt 173.0 lb

## 2023-07-01 DIAGNOSIS — M5412 Radiculopathy, cervical region: Secondary | ICD-10-CM | POA: Diagnosis not present

## 2023-07-01 DIAGNOSIS — M5030 Other cervical disc degeneration, unspecified cervical region: Secondary | ICD-10-CM

## 2023-07-01 DIAGNOSIS — M9904 Segmental and somatic dysfunction of sacral region: Secondary | ICD-10-CM | POA: Diagnosis not present

## 2023-07-01 DIAGNOSIS — M9901 Segmental and somatic dysfunction of cervical region: Secondary | ICD-10-CM

## 2023-07-01 DIAGNOSIS — M9903 Segmental and somatic dysfunction of lumbar region: Secondary | ICD-10-CM | POA: Diagnosis not present

## 2023-07-01 DIAGNOSIS — M9908 Segmental and somatic dysfunction of rib cage: Secondary | ICD-10-CM

## 2023-07-01 DIAGNOSIS — M9902 Segmental and somatic dysfunction of thoracic region: Secondary | ICD-10-CM | POA: Diagnosis not present

## 2023-07-01 MED ORDER — KETOROLAC TROMETHAMINE 60 MG/2ML IM SOLN
60.0000 mg | Freq: Once | INTRAMUSCULAR | Status: AC
  Administered 2023-07-01: 60 mg via INTRAMUSCULAR

## 2023-07-01 MED ORDER — METHYLPREDNISOLONE ACETATE 80 MG/ML IJ SUSP
80.0000 mg | Freq: Once | INTRAMUSCULAR | Status: AC
  Administered 2023-07-01: 80 mg via INTRAMUSCULAR

## 2023-07-01 NOTE — Assessment & Plan Note (Signed)
 Known degenerative disc disease.  Patient has responded well to epidurals and osteopathic manipulation previously.  Toradol  and Depo-Medrol  given again today.  Discussed different medications.  Wants to avoid adding any other medication at this time.  Follow-up with me again in 6 to 8 weeks

## 2023-07-01 NOTE — Patient Instructions (Addendum)
 Full cocktail today.  Likely menopause See me in 2 months

## 2023-07-04 ENCOUNTER — Other Ambulatory Visit: Payer: Self-pay | Admitting: Emergency Medicine

## 2023-07-04 ENCOUNTER — Other Ambulatory Visit

## 2023-07-04 ENCOUNTER — Ambulatory Visit: Payer: Self-pay | Admitting: Emergency Medicine

## 2023-07-04 ENCOUNTER — Encounter: Payer: Self-pay | Admitting: Emergency Medicine

## 2023-07-04 ENCOUNTER — Ambulatory Visit: Admitting: Emergency Medicine

## 2023-07-04 VITALS — BP 128/78 | HR 85 | Temp 98.2°F | Ht 60.0 in | Wt 173.0 lb

## 2023-07-04 DIAGNOSIS — R7303 Prediabetes: Secondary | ICD-10-CM

## 2023-07-04 DIAGNOSIS — I10 Essential (primary) hypertension: Secondary | ICD-10-CM

## 2023-07-04 DIAGNOSIS — H60503 Unspecified acute noninfective otitis externa, bilateral: Secondary | ICD-10-CM

## 2023-07-04 LAB — CBC WITH DIFFERENTIAL/PLATELET
Basophils Absolute: 0 10*3/uL (ref 0.0–0.1)
Basophils Relative: 0.3 % (ref 0.0–3.0)
Eosinophils Absolute: 0.1 10*3/uL (ref 0.0–0.7)
Eosinophils Relative: 1.1 % (ref 0.0–5.0)
HCT: 41.3 % (ref 36.0–46.0)
Hemoglobin: 13.7 g/dL (ref 12.0–15.0)
Lymphocytes Relative: 26.9 % (ref 12.0–46.0)
Lymphs Abs: 1.6 10*3/uL (ref 0.7–4.0)
MCHC: 33.2 g/dL (ref 30.0–36.0)
MCV: 91.1 fl (ref 78.0–100.0)
Monocytes Absolute: 0.6 10*3/uL (ref 0.1–1.0)
Monocytes Relative: 10.1 % (ref 3.0–12.0)
Neutro Abs: 3.6 10*3/uL (ref 1.4–7.7)
Neutrophils Relative %: 61.6 % (ref 43.0–77.0)
Platelets: 320 10*3/uL (ref 150.0–400.0)
RBC: 4.53 Mil/uL (ref 3.87–5.11)
RDW: 13.5 % (ref 11.5–15.5)
WBC: 5.9 10*3/uL (ref 4.0–10.5)

## 2023-07-04 LAB — COMPREHENSIVE METABOLIC PANEL WITH GFR
ALT: 24 U/L (ref 0–35)
AST: 25 U/L (ref 0–37)
Albumin: 4.2 g/dL (ref 3.5–5.2)
Alkaline Phosphatase: 65 U/L (ref 39–117)
BUN: 7 mg/dL (ref 6–23)
CO2: 29 meq/L (ref 19–32)
Calcium: 9.3 mg/dL (ref 8.4–10.5)
Chloride: 103 meq/L (ref 96–112)
Creatinine, Ser: 0.72 mg/dL (ref 0.40–1.20)
GFR: 99.42 mL/min (ref >60.00)
Glucose, Bld: 74 mg/dL (ref 70–99)
Potassium: 3.6 meq/L (ref 3.5–5.1)
Sodium: 140 meq/L (ref 135–145)
Total Bilirubin: 0.5 mg/dL (ref 0.2–1.2)
Total Protein: 7.3 g/dL (ref 6.0–8.3)

## 2023-07-04 LAB — TSH: TSH: 1.56 u[IU]/mL (ref 0.35–5.50)

## 2023-07-04 LAB — VITAMIN B12: Vitamin B-12: 447 pg/mL (ref 211–911)

## 2023-07-04 MED ORDER — HYDROCORTISONE-ACETIC ACID 1-2 % OT SOLN: 3.0000 [drp] | Freq: Three times a day (TID) | OTIC | 1 refills | Status: AC

## 2023-07-04 NOTE — Assessment & Plan Note (Signed)
 Elevated sugar readings at home Presently on metformin  500 mg twice a day Recommend blood work today including hemoglobin A1c Diet and nutrition discussed

## 2023-07-04 NOTE — Patient Instructions (Signed)
 Prediabetes: What to Know Prediabetes is when your blood sugar, also called glucose, is at a higher level than normal but not high enough for you to be diagnosed with type 2 diabetes (type 2 diabetes mellitus). Having prediabetes puts you at risk for getting type 2 diabetes. By making some healthy changes, you may be able to prevent or delay getting type 2 diabetes. This is important because type 2 diabetes can lead to serious problems. Some of these include: Heart disease. Stroke. Blindness. Kidney disease. Depression. Poor blood flow in the feet and legs. In very bad cases, this could lead to having a leg removed by surgery (amputation). What are the causes? The exact cause of prediabetes isn't known. It may result from insulin resistance. Insulin resistance happens when cells in the body don't respond properly to insulin that the body makes. This can cause too much sugar to build up in the blood. High blood sugar, also called hyperglycemia, can develop. What increases the risk? Having a family member with type 2 diabetes. Being older than 48 years of age. Having had a temporary form of diabetes during a pregnancy. This is called gestational diabetes. Having had polycystic ovary syndrome (PCOS). Being overweight or obese. Being inactive and not getting much exercise. Having a history of heart disease. This may include problems with cholesterol levels, high levels of blood fats, or high blood pressure. What are the signs or symptoms? You may have no symptoms. If you do have symptoms, they may include: Increased hunger. Increased thirst. Needing to pee more often. Changes in how you see, like blurry vision. Feeling tired. How is this diagnosed? Prediabetes can be diagnosed with blood tests that check your blood sugar. One or more of these tests may be done: A fasting blood glucose (FBG) test. You won't be allowed to eat (you will fast) for at least 8 hours before a blood sample is  taken. An A1C blood test, also called a hemoglobin A1C test. This test shows information about blood sugar levels over the past 2?3 months. An oral glucose tolerance test (OGTT). This test measures your blood sugar at two points in time: After you haven't eaten for a while. This is your baseline level. Two hours after you drink a beverage that has sugar in it. You may be diagnosed with prediabetes if: Your FBG is 100?125 mg/dL (1.6-1.0 mmol/L). Your A1C level is 5.7?6.4% (39-46 mmol/mol). Your OGTT result is 140?199 mg/dL (9.6-04 mmol/L). These blood tests may need to be done again to be sure of the diagnosis. How is this treated? Treatment may include making changes to your diet and lifestyle. These changes can help lower your blood sugar and keep you from getting type 2 diabetes. In some cases, medicine may be given to help lower your risk. Follow these instructions at home: Eating and drinking  Eat and drink as told. Follow a healthy meal plan. This includes eating lean proteins, whole grains, legumes, fresh fruits and vegetables, low-fat dairy products, and healthy fats. Meet with an expert in healthy eating called a dietitian. This person can help create a healthy eating plan that's right for you. Lifestyle Do moderate-intensity exercise. Do this for at least 30 minutes a day on 5 or more days each week, or as told by your health care provider. A mix of activities may be best. Good choices include brisk walking, swimming, biking, and weight lifting. Try to lose weight if your provider says it's OK. Losing 5-7% of your body weight can  help reverse insulin resistance. Do not drink alcohol if: Your provider tells you not to drink. You're pregnant, may be pregnant, or plan to become pregnant. If you drink alcohol: Limit how much you have to: 0-1 drink a day if you're female. 0-2 drinks a day if you're female. Know how much alcohol is in your drink. In the [place] one drink is one 12 oz  bottle of beer (355 mL), one 5 oz glass of wine (148 mL), or one 1 oz glass of hard liquor (44 mL). General instructions Take medicines only as told. You may be given medicines that help lower the risk of type 2 diabetes. Do not smoke, vape, or use nicotine or tobacco. Where to find more information American Diabetes Association: diabetes.org/about-diabetes/prediabetes Academy of Nutrition and Dietetics: eatright.org American Heart Association: Go to ThisJobs.cz. Click the search icon. Type "prediabetes" in the search box. Contact a health care provider if: You have any of these symptoms: Increased hunger. Peeing more often than usual. Increased thirst. Feeling tired. Changes in how you see, like blurry vision. Feeling like you may throw up. Throwing up. Get help right away if: You have shortness of breath. You feel confused. This information is not intended to replace advice given to you by your health care provider. Make sure you discuss any questions you have with your health care provider. Document Revised: 07/22/2022 Document Reviewed: 07/22/2022 Elsevier Patient Education  2024 [employer].

## 2023-07-04 NOTE — Assessment & Plan Note (Signed)
 Unknown cause or trigger. Inflammation on physical exam Recommend topical VoSoL 3 times a day both ears for the next 5 to 7 days

## 2023-07-04 NOTE — Assessment & Plan Note (Addendum)
 BP Readings from Last 3 Encounters:  07/04/23 128/78  07/01/23 110/74  05/23/23 120/87  Well-controlled hypertension Continue lisinopril  20 mg daily

## 2023-07-04 NOTE — Discharge Instructions (Signed)

## 2023-07-04 NOTE — Progress Notes (Signed)
 Melissa [name] 48 y.o.   Chief Complaint  Patient presents with   Ear Pain    Patient here is for ear pain in both ears. She states every morning for the last 3 months she feels the pain. Patient also say her sugar level this morning was 143 before meal and states it stays around there wants her levels checked today.     HISTORY OF PRESENT ILLNESS: This is a 48 y.o. female complaining of bilateral ear discomfort Also concerned about her sugar levels which have been in the 140s range both mornings and afternoons No other complaints or medical concerns today Lab Results  Component Value Date   HGBA1C 5.8 04/25/2023   Wt Readings from Last 3 Encounters:  07/04/23 173 lb (78.5 kg)  07/01/23 173 lb (78.5 kg)  06/17/23 167 lb (75.8 kg)     HPI   Prior to Admission medications   Medication Sig Start Date End Date Taking? Authorizing Provider  ACCU-CHEK GUIDE TEST test strip USE AS DIRECTED ONCE DAILY AS NEEDED 03/27/23  Yes Jannis Atkins, Emil Schanz, MD  Blood Glucose Monitoring Suppl DEVI 1 each by Does not apply route daily as needed. May substitute to any manufacturer covered by patient's insurance. 03/05/23  Yes Emiliano Welshans Jose, MD  buPROPion (WELLBUTRIN XL) 150 MG 24 hr tablet Take 450 mg by mouth daily. 02/27/21  Yes [provider]  DULoxetine (CYMBALTA) 60 MG capsule Take 60 mg by mouth daily. 09/14/21  Yes [provider]  estradiol (CLIMARA - DOSED IN MG/24 HR) 0.05 mg/24hr patch Place 0.05 mg onto the skin once a week.   Yes [provider]  estradiol (VIVELLE-DOT) 0.1 MG/24HR patch Place 1 patch onto the skin 2 (two) times a week. 12/06/22  Yes [provider]  fexofenadine  (ALLEGRA  ALLERGY ) 180 MG tablet Take 1 tablet (180 mg total) by mouth daily. At bedtime for drainage 01/18/23  Yes Scarantino-Southworth, Sylvia, PA-C  hydrOXYzine  (ATARAX ) 25 MG tablet TAKE 1 TABLET BY MOUTH AT BEDTIME AS NEEDED. 12/19/22  Yes Kavari Parrillo, Emil Schanz, MD   hydrOXYzine  (ATARAX ) 25 MG tablet Take 1 tablet by mouth at bedtime as needed.   Yes [provider]  Lancets Misc. MISC 1 each by Does not apply route daily as needed. May substitute to any manufacturer covered by patient's insurance. 03/05/23  Yes Mekel Haverstock Jose, MD  levonorgestrel (MIRENA) 20 MCG/DAY IUD 1 each by Intrauterine route once.   Yes [provider]  lisinopril  (ZESTRIL ) 20 MG tablet TAKE 1 TABLET BY MOUTH EVERY DAY 02/04/23  Yes Dareen Gutzwiller, Emil Schanz, MD  metFORMIN  (GLUCOPHAGE ) 500 MG tablet Take 1 tablet (500 mg total) by mouth 2 (two) times daily with a meal. 03/05/23  Yes Ha Placeres, Emil Schanz, MD  Olopatadine  HCl (PATADAY ) 0.2 % SOLN Place 1 drop into both eyes daily as needed. 08/31/21  Yes Jeneal Danita Macintosh, MD  RYALTRIS  [phone] MCG/ACT SUSP Place 2 sprays into the nose in the morning and at bedtime. 12/21/21  Yes AmbsArlean HERO, FNP  Salicylic Acid  3 % SHAM Use daily as directed for dry scalp 09/23/17  Yes Jason Leita Repine, FNP  selenium  sulfide (SELSUN ) 2.5 % shampoo USE LOTION EXTERNALLY  TO AFFECTED AREA ONCE DAILY AS NEEDED IRRITATION 02/15/20  Yes Jason Leita Repine, FNP  tranexamic acid (LYSTEDA) 650 MG TABS tablet Take 650 mg by mouth daily. 01/04/23  Yes [provider]  triamcinolone  cream (KENALOG ) 0.1 % Apply 1 Application topically as directed. 10/30/22  Yes [provider]  UNABLE TO FIND    Yes [provider]  VEOZAH  45 MG TABS TAKE 1 TABLET BY MOUTH EVERY DAY 03/01/23  Yes Netta Fodge, Emil Schanz, MD  XIIDRA 5 % SOLN INSTILL 1 DROP TWICE DAILY INTO [facility] 02/02/20  Yes [provider]    Allergies  Allergen Reactions   Bactrim  [Sulfamethoxazole -Trimethoprim ] Itching    Patient Active Problem List   Diagnosis Date Noted   Excessive gas 04/25/2023   Prediabetes 03/05/2023   Multiple allergies 01/28/2023   Degenerative disc disease, cervical 01/28/2023   Arthritis of carpometacarpal [facility])  joint of both thumbs 10/08/2022   Chronic insomnia 10/04/2022   Early menopause 05/21/2022   Vasomotor symptoms due to menopause 10/16/2021   Essential hypertension 06/28/2021   Chronic pain of both knees 06/28/2021   Weight gain 02/08/2021   Family history of thyroid  disease 02/08/2021   Gastroesophageal reflux disease 11/29/2020   Nonallopathic lesion of lumbosacral region 03/01/2019   Nonallopathic lesion of cervical region 03/01/2019   Nonallopathic lesion of sacral region 03/01/2019   Nonallopathic lesion of thoracic region 03/01/2019   Nonallopathic lesion of rib cage 03/01/2019   Vitamin D  deficiency 05/08/2018   Mixed stress and urge urinary incontinence 02/07/2016   Carpal tunnel syndrome 01/03/2016   Polyarthralgia 01/03/2016   Bipolar depression (HCC) 05/17/2014   Chronic night sweats 06/26/2013   Obesity without serious comorbidity 01/25/2012    Past Medical History:  Diagnosis Date   Anxiety    Arthritis    Carpal tunnel syndrome    Depression    GERD (gastroesophageal reflux disease)    Hammer toe 04/2013   bilateral 4th and 5th    Ingrown left big toenail 04/2013   Mental disorder    Neuromuscular disorder (HCC)    Post term pregnancy over 40 weeks 09/21/2015   SVD (spontaneous vaginal delivery) 09/22/2015   UTI (lower urinary tract infection)     Past Surgical History:  Procedure Laterality Date   BREAST LIFT Bilateral    CARPAL TUNNEL RELEASE     CORRECTION HAMMER TOE Bilateral    CRYOABLATION  2004   CYSTOSCOPY WITH HYDRODISTENSION AND BIOPSY  07/28/2004   with urethral dilatation   DILATION AND CURETTAGE OF UTERUS     DILATION AND EVACUATION  12/21/2011   Procedure: DILATATION AND EVACUATION;  Surgeon: Curlee VEAR Guan, MD;  Location: [facility];  Service: Gynecology;  Laterality: N/A;  first trimester    ESOPHAGEAL MANOMETRY N/A 02/10/2021   Procedure: ESOPHAGEAL MANOMETRY (EM);  Surgeon: Shila Gustav GAILS, MD;  Location: [facility];  Service: Endoscopy;  Laterality: N/A;   HAMMER TOE SURGERY Bilateral 04/20/2013   Procedure: BILATERAL HAMMER TOE REPAIR AND EXCISION NAIL PERMANENT LEFT FIRST;  Surgeon: Charlie Failing, DPM;  Location: [facility];  Service: Podiatry;  Laterality: Bilateral;  Hammer toe repair fourth and fifth toes bilateral feet with screw placement fourth toes and partial nail excision left great toe   HYSTEROSALPINGOGRAM     MASS EXCISION Left 03/27/2022   Procedure: EXCISION SOFT TISSUE MASS x2, LEFT UPPER ARM;  Surgeon: Eletha Boas, MD;  Location: [facility];  Service: General;  Laterality: Left;  LMA   REDUCTION MAMMAPLASTY Bilateral 2020   TOE SURGERY     ingrown toenail   WISDOM TOOTH EXTRACTION      Social History   Socioeconomic History   Marital status: Single    Spouse name: Not on file  Number of children: 1   Years of education: Not on file   Highest education level: Not on file  Occupational History   Occupation: Homemaker   Occupation: disabled  Tobacco Use   Smoking status: Never    Passive exposure: Never   Smokeless tobacco: Never  Vaping Use   Vaping status: Never Used  Substance and Sexual Activity   Alcohol use: Not Currently    Comment: occasionally   Drug use: Never   Sexual activity: Not Currently    Birth control/protection: I.U.D.  Other Topics Concern   Not on file  Social History Narrative   Lives with her daughter/2025   Social Drivers of Health   Financial Resource Strain: Medium Risk (06/17/2023)   Overall Financial Resource Strain (CARDIA)    Difficulty of Paying Living Expenses: Somewhat hard  Food Insecurity: Food Insecurity Present (06/17/2023)   Hunger Vital Sign    Worried About Running Out of Food in the Last Year: Sometimes true    Ran Out of Food in the Last Year: Sometimes true  Transportation Needs: No Transportation Needs (06/17/2023)   PRAPARE - Administrator, Civil Service (Medical):  No    Lack of Transportation (Non-Medical): No  Physical Activity: Insufficiently Active (06/17/2023)   Exercise Vital Sign    Days of Exercise per Week: 3 days    Minutes of Exercise per Session: 30 min  Stress: Stress Concern Present (06/17/2023)   [employer] of Occupational Health - Occupational Stress Questionnaire    Feeling of Stress: Rather much  Social Connections: Moderately Integrated (06/17/2023)   Social Connection and Isolation Panel    Frequency of Communication with Friends and Family: More than three times a week    Frequency of Social Gatherings with Friends and Family: Once a week    Attends Religious Services: More than 4 times per year    Active Member of [employer] or Organizations: Yes    Attends Banker Meetings: 1 to 4 times per year    Marital Status: Never married  Intimate Partner Violence: Patient Unable To Answer (06/17/2023)   Humiliation, Afraid, Rape, and Kick questionnaire    Fear of Current or Ex-Partner: Patient unable to answer    Emotionally Abused: Patient unable to answer    Physically Abused: Patient unable to answer    Sexually Abused: Patient unable to answer    Family History  Problem Relation Age of Onset   Asthma Mother    Seizures Mother    Arthritis Mother    Lung cancer Father    Thyroid  disease Maternal Grandmother    Heart disease Paternal Grandmother        2 CABG   Lung cancer Paternal Grandfather      Review of Systems  Constitutional: Negative.  Negative for chills and fever.  HENT:  Positive for ear pain.   Respiratory:  Negative for cough and shortness of breath.   Cardiovascular: Negative.  Negative for chest pain and palpitations.  Gastrointestinal:  Negative for abdominal pain, diarrhea, nausea and vomiting.  Genitourinary: Negative.  Negative for dysuria and hematuria.  Skin: Negative.  Negative for rash.  Neurological: Negative.  Negative for dizziness and headaches.  All other systems reviewed and  are negative.   Vitals:   07/04/23 1026  BP: 128/78  Pulse: 85  Temp: 98.2 F (36.8 C)  SpO2: 97%     Physical Exam Vitals reviewed.  HENT:     Head: Normocephalic.  Right Ear: Tympanic membrane normal.     Left Ear: Tympanic membrane normal.     Ears:     Comments: Both external canals are tender with mild inflammation Eyes:     Extraocular Movements: Extraocular movements intact.     Pupils: Pupils are equal, round, and reactive to light.  Cardiovascular:     Rate and Rhythm: Normal rate and regular rhythm.     Pulses: Normal pulses.     Heart sounds: Normal heart sounds.  Pulmonary:     Effort: Pulmonary effort is normal.     Breath sounds: Normal breath sounds.  Musculoskeletal:     Cervical back: No tenderness.  Lymphadenopathy:     Cervical: No cervical adenopathy.  Skin:    General: Skin is warm and dry.     Capillary Refill: Capillary refill takes less than 2 seconds.  Neurological:     General: No focal deficit present.     Mental Status: She is alert and oriented to person, place, and time.  Psychiatric:        Mood and Affect: Mood normal.        Behavior: Behavior normal.      ASSESSMENT & PLAN: A total of 40 minutes was spent with the patient and counseling/coordination of care regarding preparing for this visit, review of most recent office visit notes, review of multiple chronic medical conditions and their management, review of all medications, review of most recent bloodwork results, review of health maintenance items, education on nutrition, prognosis, documentation, and need for follow up.  Problem List Items Addressed This Visit       Cardiovascular and Mediastinum   Essential hypertension   BP Readings from Last 3 Encounters:  07/04/23 128/78  07/01/23 110/74  05/23/23 120/87  Well-controlled hypertension Continue lisinopril  20 mg daily       Relevant Orders   CBC with Differential/Platelet   Comprehensive metabolic panel with  GFR   TSH   Vitamin B12     Nervous and Auditory   Acute otitis externa of both ears   Unknown cause or trigger. Inflammation on physical exam Recommend topical VoSoL 3 times a day both ears for the next 5 to 7 days      Relevant Medications   acetic acid-hydrocortisone (VOSOL-HC) OTIC solution     Other   Prediabetes - Primary   Elevated sugar readings at home Presently on metformin  500 mg twice a day Recommend blood work today including hemoglobin A1c Diet and nutrition discussed      Relevant Orders   CBC with Differential/Platelet   Comprehensive metabolic panel with GFR   TSH   Vitamin B12   Patient Instructions  Prediabetes: What to Know Prediabetes is when your blood sugar, also called glucose, is at a higher level than normal but not high enough for you to be diagnosed with type 2 diabetes (type 2 diabetes mellitus). Having prediabetes puts you at risk for getting type 2 diabetes. By making some healthy changes, you may be able to prevent or delay getting type 2 diabetes. This is important because type 2 diabetes can lead to serious problems. Some of these include: Heart disease. Stroke. Blindness. Kidney disease. Depression. Poor blood flow in the feet and legs. In very bad cases, this could lead to having a leg removed by surgery (amputation). What are the causes? The exact cause of prediabetes isn't known. It may result from insulin  resistance. Insulin  resistance happens when cells in the body don't  respond properly to insulin  that the body makes. This can cause too much sugar to build up in the blood. High blood sugar, also called hyperglycemia, can develop. What increases the risk? Having a family member with type 2 diabetes. Being older than 48 years of age. Having had a temporary form of diabetes during a pregnancy. This is called gestational diabetes. Having had polycystic ovary syndrome (PCOS). Being overweight or obese. Being inactive and not getting  much exercise. Having a history of heart disease. This may include problems with cholesterol levels, high levels of blood fats, or high blood pressure. What are the signs or symptoms? You may have no symptoms. If you do have symptoms, they may include: Increased hunger. Increased thirst. Needing to pee more often. Changes in how you see, like blurry vision. Feeling tired. How is this diagnosed? Prediabetes can be diagnosed with blood tests that check your blood sugar. One or more of these tests may be done: A fasting blood glucose (FBG) test. You won't be allowed to eat (you will fast) for at least 8 hours before a blood sample is taken. An A1C blood test, also called a hemoglobin A1C test. This test shows information about blood sugar levels over the past 2?3 months. An oral glucose tolerance test (OGTT). This test measures your blood sugar at two points in time: After you haven't eaten for a while. This is your baseline level. Two hours after you drink a beverage that has sugar in it. You may be diagnosed with prediabetes if: Your FBG is 100?125 mg/dL (4.3-3.0 mmol/L). Your A1C level is 5.7?6.4% (39-46 mmol/mol). Your OGTT result is 140?199 mg/dL (2.1-88 mmol/L). These blood tests may need to be done again to be sure of the diagnosis. How is this treated? Treatment may include making changes to your diet and lifestyle. These changes can help lower your blood sugar and keep you from getting type 2 diabetes. In some cases, medicine may be given to help lower your risk. Follow these instructions at home: Eating and drinking  Eat and drink as told. Follow a healthy meal plan. This includes eating lean proteins, whole grains, legumes, fresh fruits and vegetables, low-fat dairy products, and healthy fats. Meet with an expert in healthy eating called a dietitian. This person can help create a healthy eating plan that's right for you. Lifestyle Do moderate-intensity exercise. Do this for at  least 30 minutes a day on 5 or more days each week, or as told by your health care provider. A mix of activities may be best. Good choices include brisk walking, swimming, biking, and weight lifting. Try to lose weight if your provider says it's OK. Losing 5-7% of your body weight can help reverse insulin  resistance. Do not drink alcohol if: Your provider tells you not to drink. You're pregnant, may be pregnant, or plan to become pregnant. If you drink alcohol: Limit how much you have to: 0-1 drink a day if you're female. 0-2 drinks a day if you're female. Know how much alcohol is in your drink. In the [place] one drink is one 12 oz bottle of beer (355 mL), one 5 oz glass of wine (148 mL), or one 1 oz glass of hard liquor (44 mL). General instructions Take medicines only as told. You may be given medicines that help lower the risk of type 2 diabetes. Do not smoke, vape, or use nicotine or tobacco. Where to find more information American Diabetes Association: diabetes.org/about-diabetes/prediabetes Academy of Nutrition and Dietetics: eatright.org American  Heart Association: Go to ThisJobs.cz. Click the search icon. Type prediabetes in the search box. Contact a health care provider if: You have any of these symptoms: Increased hunger. Peeing more often than usual. Increased thirst. Feeling tired. Changes in how you see, like blurry vision. Feeling like you may throw up. Throwing up. Get help right away if: You have shortness of breath. You feel confused. This information is not intended to replace advice given to you by your health care provider. Make sure you discuss any questions you have with your health care provider. Document Revised: 07/22/2022 Document Reviewed: 07/22/2022 Elsevier Patient Education  2024 Elsevier Inc.      Emil Schaumann, MD [facility]

## 2023-07-08 ENCOUNTER — Inpatient Hospital Stay
Admission: RE | Admit: 2023-07-08 | Discharge: 2023-07-08 | Disposition: A | Discharge status: Home / Self Care | Source: Ambulatory Visit | Attending: Family Medicine | Admitting: Family Medicine

## 2023-07-08 ENCOUNTER — Ambulatory Visit (INDEPENDENT_AMBULATORY_CARE_PROVIDER_SITE_OTHER)

## 2023-07-08 DIAGNOSIS — R7303 Prediabetes: Secondary | ICD-10-CM | POA: Diagnosis not present

## 2023-07-08 LAB — HEMOGLOBIN A1C: Hgb A1c MFr Bld: 6.3 % (ref 4.6–6.5)

## 2023-07-08 NOTE — Telephone Encounter (Signed)
 Hemoglobin A1c is 6.3.  Still not diabetic range.  Prediabetic.  Recommend referral to nutritionist.  Please place referral.

## 2023-07-19 ENCOUNTER — Institutional Professional Consult (permissible substitution): Admitting: Plastic Surgery

## 2023-07-22 NOTE — Discharge Instructions (Signed)

## 2023-07-23 ENCOUNTER — Other Ambulatory Visit

## 2023-07-23 ENCOUNTER — Ambulatory Visit
Admission: RE | Admit: 2023-07-23 | Discharge: 2023-07-23 | Disposition: A | Discharge status: Home / Self Care | Source: Ambulatory Visit | Attending: Family Medicine | Admitting: Family Medicine

## 2023-07-23 DIAGNOSIS — M4722 Other spondylosis with radiculopathy, cervical region: Secondary | ICD-10-CM | POA: Diagnosis not present

## 2023-07-23 DIAGNOSIS — M5412 Radiculopathy, cervical region: Secondary | ICD-10-CM

## 2023-07-23 MED ORDER — TRIAMCINOLONE ACETONIDE 40 MG/ML IJ SUSP (RADIOLOGY)
60.0000 mg | Freq: Once | INTRAMUSCULAR | Status: AC
  Administered 2023-07-23: 60 mg via EPIDURAL

## 2023-07-23 MED ORDER — IOPAMIDOL (ISOVUE-M 300) INJECTION 61%
1.0000 mL | Freq: Once | INTRAMUSCULAR | Status: AC | PRN
  Administered 2023-07-23: 1 mL via EPIDURAL

## 2023-07-29 ENCOUNTER — Encounter

## 2023-07-29 ENCOUNTER — Telehealth: Payer: Self-pay

## 2023-07-29 ENCOUNTER — Telehealth: Payer: Self-pay | Admitting: Gastroenterology

## 2023-07-29 NOTE — Telephone Encounter (Signed)
 Thank you so much

## 2023-07-29 NOTE — Telephone Encounter (Signed)
 Called patient in regards to missing pre visit appt, rescheduled her for tomorrow at 1pm..  Please advise  thank you

## 2023-07-29 NOTE — Telephone Encounter (Signed)
 Patient was called for PV.No answer.I left a message to call and reschedule PV appointment. The patients phone message was in Spanish so I am not sure that she understood my message. I gave the patients number to a scheduler that spoke Spanish and she said she would call and get the patient rescheduled.

## 2023-07-30 ENCOUNTER — Ambulatory Visit (AMBULATORY_SURGERY_CENTER)

## 2023-07-30 ENCOUNTER — Encounter: Payer: Self-pay | Admitting: Gastroenterology

## 2023-07-30 VITALS — Ht 60.0 in | Wt 160.0 lb

## 2023-07-30 DIAGNOSIS — Z1211 Encounter for screening for malignant neoplasm of colon: Secondary | ICD-10-CM

## 2023-07-30 MED ORDER — NA SULFATE-K SULFATE-MG SULF 17.5-3.13-1.6 GM/177ML PO SOLN: 1.0000 | Freq: Once | ORAL | 0 refills | Status: AC

## 2023-07-30 NOTE — Progress Notes (Signed)
 No egg or soy allergy known to patient  No issues known to pt with past sedation with any surgeries or procedures Patient denies ever being told they had issues or difficulty with intubation  No FH of Malignant Hyperthermia Pt is not on diet pills Pt is not on  home 02  Pt is not on blood thinners  Pt denies issues with constipation  No A fib or A flutter Have any cardiac testing pending--no Pt can ambulate independently Pt denies use of chewing tobacco Discussed diabetic I weight loss medication holds Discussed NSAID holds Checked BMI Pt instructed to use Singlecare.com or GoodRx for a price reduction on prep  Patient's chart reviewed by Melissa [name] CNRA prior to previsit and patient appropriate for the [facility].  Pre visit completed and red dot placed by patient's name on their procedure day (on provider's schedule).

## 2023-07-31 ENCOUNTER — Encounter: Payer: Self-pay | Admitting: Emergency Medicine

## 2023-08-01 NOTE — Telephone Encounter (Signed)
 Do not recommend either one.  Antibiotics not indicated.  She might be overweight but she is not obese and does not need Wegovy .

## 2023-08-05 ENCOUNTER — Other Ambulatory Visit: Payer: Self-pay | Admitting: Radiology

## 2023-08-05 ENCOUNTER — Other Ambulatory Visit: Payer: Self-pay | Admitting: Emergency Medicine

## 2023-08-05 ENCOUNTER — Encounter: Payer: Self-pay | Admitting: Emergency Medicine

## 2023-08-05 DIAGNOSIS — E66811 Obesity, class 1: Secondary | ICD-10-CM

## 2023-08-05 DIAGNOSIS — H9203 Otalgia, bilateral: Secondary | ICD-10-CM

## 2023-08-05 MED ORDER — WEGOVY 0.25 MG/0.5ML ~~LOC~~ SOAJ: 0.2500 mg | SUBCUTANEOUS | 3 refills | Status: DC

## 2023-08-05 NOTE — Telephone Encounter (Signed)
 Recommend referral to medical weight management clinic

## 2023-08-05 NOTE — Telephone Encounter (Signed)
 I will send a prescription for Wegovy  but I do not think it will be approved by her insurance. Place a referral for ENT for chronic ear pain please.

## 2023-08-05 NOTE — Telephone Encounter (Signed)
 She can pay cash for it.

## 2023-08-06 ENCOUNTER — Encounter (INDEPENDENT_AMBULATORY_CARE_PROVIDER_SITE_OTHER): Payer: Self-pay

## 2023-08-09 ENCOUNTER — Other Ambulatory Visit (HOSPITAL_COMMUNITY): Payer: Self-pay

## 2023-08-09 ENCOUNTER — Telehealth: Payer: Self-pay

## 2023-08-09 ENCOUNTER — Encounter: Payer: Self-pay | Admitting: Emergency Medicine

## 2023-08-09 NOTE — Telephone Encounter (Signed)
 Pharmacy Patient Advocate Encounter   Received notification from Patient Advice Request messages that prior authorization for Wegovy  0.25MG /0.5ML auto-injectors is required/requested.   Insurance verification completed.   The patient is insured through [facility] .   Per test claim: PA required; PA submitted to above mentioned insurance via CoverMyMeds Key/confirmation #/EOC [id number] Status is pending

## 2023-08-12 ENCOUNTER — Encounter: Admitting: Gastroenterology

## 2023-08-12 ENCOUNTER — Telehealth: Payer: Self-pay | Admitting: Gastroenterology

## 2023-08-12 NOTE — Telephone Encounter (Signed)
 Good morning Dr. Shila,   Patient requested to reschedule today's 3:00 procedure. Patient states she is not feeling well and has a fever. Patient has been rescheduled for 8/29. Would also like new prep instructions.   Thank you

## 2023-08-12 NOTE — Telephone Encounter (Signed)
 Pharmacy Patient Advocate Encounter  Received notification from [employer] that Prior Authorization for Wegovy  0.25MG /0.5ML auto-injectors has been DENIED.  Full denial letter will be uploaded to the media tab. See denial reason below.   PA #/Case ID/Reference #: [id number]   DENIAL REASON: Anti-obesity medication is excluded from Part D.

## 2023-08-21 NOTE — Progress Notes (Signed)
 [name] [address] [place] [address] Phone: [phone] Subjective:   Melissa [name] am a scribe for Dr. Claudene.   I'm seeing this patient by the request  of:  Sagardia, Emil Schanz, MD  CC: Back and neck pain follow-up  YEP:Dlagzrupcz  Melissa [name] is a 48 y.o. female coming in with complaint of back and neck pain. OMT on 07/01/2023. Patient states needs a cocktail. Right side pain from the neck down to toes.   Medications patient has been prescribed:   Taking:         Reviewed prior external information including notes and imaging from previsou exam, outside providers and external EMR if available.   As well as notes that were available from care everywhere and other healthcare systems.  Past medical history, social, surgical and family history all reviewed in electronic medical record.  No pertanent information unless stated regarding to the chief complaint.   Past Medical History:  Diagnosis Date   Allergy     Anxiety    Arthritis    Carpal tunnel syndrome    Depression    GERD (gastroesophageal reflux disease)    Hammer toe 04/2013   bilateral 4th and 5th    Hyperlipidemia    Hypertension    Ingrown left big toenail 04/2013   Mental disorder    Neuromuscular disorder (HCC)    Post term pregnancy over 40 weeks 09/21/2015   SVD (spontaneous vaginal delivery) 09/22/2015   UTI (lower urinary tract infection)     Allergies  Allergen Reactions   Bactrim  [Sulfamethoxazole -Trimethoprim ] Itching     Review of Systems:  No headache, visual changes, nausea, vomiting, diarrhea, constipation, dizziness, abdominal pain, skin rash, fevers, chills, night sweats, weight loss, swollen lymph nodes, body aches, joint swelling, chest pain, shortness of breath, mood changes. POSITIVE muscle aches  Objective  Blood pressure 124/60, pulse 95, height 5' (1.524 m), SpO2 98%.   General: No apparent distress alert and oriented  x3 mood and affect normal, dressed appropriately.  HEENT: Pupils equal, extraocular movements intact  Respiratory: Patient's speak in full sentences and does not appear short of breath  Cardiovascular: No lower extremity edema, non tender, no erythema  Gait MSK:  Back does have some loss of lordosis noted.  Some tenderness to palpation in the paraspinal musculature.  Seems worse right greater than left.  Osteopathic findings  C3 flexed rotated and side bent right C7 flexed rotated and side bent left T3 extended rotated and side bent right inhaled rib T9 extended rotated and side bent left L3 flexed rotated and side bent right Sacrum right on right       Assessment and Plan:  No problem-specific Assessment & Plan notes found for this encounter.    Nonallopathic problems  Decision today to treat with OMT was based on Physical Exam  After verbal consent patient was treated with HVLA, ME, FPR techniques in cervical, rib, thoracic, lumbar, and sacral  areas  Patient tolerated the procedure well with improvement in symptoms  Patient given exercises, stretches and lifestyle modifications  See medications in patient instructions if given  Patient will follow up in 4-8 weeks     The above documentation has been reviewed and is accurate and complete Melissa Forst M [name], DO         Note: This dictation was prepared with Dragon dictation along with smaller phrase technology. Any transcriptional errors that result from this process are unintentional.

## 2023-08-26 ENCOUNTER — Encounter: Payer: Self-pay | Admitting: Family Medicine

## 2023-08-26 ENCOUNTER — Ambulatory Visit (INDEPENDENT_AMBULATORY_CARE_PROVIDER_SITE_OTHER): Admitting: Family Medicine

## 2023-08-26 VITALS — BP 124/60 | HR 95 | Ht 60.0 in

## 2023-08-26 DIAGNOSIS — M9908 Segmental and somatic dysfunction of rib cage: Secondary | ICD-10-CM | POA: Diagnosis not present

## 2023-08-26 DIAGNOSIS — M5030 Other cervical disc degeneration, unspecified cervical region: Secondary | ICD-10-CM | POA: Diagnosis not present

## 2023-08-26 DIAGNOSIS — M9902 Segmental and somatic dysfunction of thoracic region: Secondary | ICD-10-CM

## 2023-08-26 DIAGNOSIS — M9904 Segmental and somatic dysfunction of sacral region: Secondary | ICD-10-CM

## 2023-08-26 DIAGNOSIS — M9901 Segmental and somatic dysfunction of cervical region: Secondary | ICD-10-CM | POA: Diagnosis not present

## 2023-08-26 DIAGNOSIS — M5441 Lumbago with sciatica, right side: Secondary | ICD-10-CM

## 2023-08-26 DIAGNOSIS — G8929 Other chronic pain: Secondary | ICD-10-CM | POA: Diagnosis not present

## 2023-08-26 DIAGNOSIS — M9903 Segmental and somatic dysfunction of lumbar region: Secondary | ICD-10-CM | POA: Diagnosis not present

## 2023-08-26 MED ORDER — KETOROLAC TROMETHAMINE 60 MG/2ML IM SOLN
60.0000 mg | Freq: Once | INTRAMUSCULAR | Status: AC
  Administered 2023-08-26: 60 mg via INTRAMUSCULAR

## 2023-08-26 MED ORDER — METHYLPREDNISOLONE ACETATE 80 MG/ML IJ SUSP
80.0000 mg | Freq: Once | INTRAMUSCULAR | Status: AC
  Administered 2023-08-26: 80 mg via INTRAMUSCULAR

## 2023-08-26 NOTE — Assessment & Plan Note (Signed)
 Chronic problem with exacerbation.  Discussed icing regimen and home exercises.  Toradol  and Depo-Medrol  injections given today.  Cymbalta encouraged to continue.  Follow-up again in 6 to 8 weeks

## 2023-08-26 NOTE — Patient Instructions (Addendum)
 2 month f/u Full cocktail today.

## 2023-08-27 ENCOUNTER — Ambulatory Visit: Admitting: Gastroenterology

## 2023-08-27 ENCOUNTER — Encounter: Payer: Self-pay | Admitting: Gastroenterology

## 2023-08-27 NOTE — Progress Notes (Unsigned)
 Pt arrived for colonoscopy and states that her stools are not clear.  Per Dr. Nandigam pt will need to be rescheduled.  Pt rescheduled for tomorrow with Dr. Legrand. She was told to stay on clear liquids and instructions reviewed for miralax prep.

## 2023-08-28 ENCOUNTER — Other Ambulatory Visit: Payer: Self-pay | Admitting: Emergency Medicine

## 2023-08-28 ENCOUNTER — Ambulatory Visit (AMBULATORY_SURGERY_CENTER): Admitting: Gastroenterology

## 2023-08-28 ENCOUNTER — Encounter: Payer: Self-pay | Admitting: Gastroenterology

## 2023-08-28 VITALS — BP 96/75 | HR 66 | Temp 98.0°F | Resp 17 | Ht 60.0 in | Wt 160.0 lb

## 2023-08-28 DIAGNOSIS — R7303 Prediabetes: Secondary | ICD-10-CM

## 2023-08-28 DIAGNOSIS — D123 Benign neoplasm of transverse colon: Secondary | ICD-10-CM | POA: Diagnosis not present

## 2023-08-28 DIAGNOSIS — Z1211 Encounter for screening for malignant neoplasm of colon: Secondary | ICD-10-CM | POA: Diagnosis not present

## 2023-08-28 DIAGNOSIS — E785 Hyperlipidemia, unspecified: Secondary | ICD-10-CM | POA: Diagnosis not present

## 2023-08-28 DIAGNOSIS — I10 Essential (primary) hypertension: Secondary | ICD-10-CM | POA: Diagnosis not present

## 2023-08-28 MED ORDER — SODIUM CHLORIDE 0.9 % IV SOLN: 500.0000 mL | INTRAVENOUS | Status: DC

## 2023-08-28 NOTE — Progress Notes (Signed)
 Sedate, gd SR, tolerated procedure well, VSS, report to RN

## 2023-08-28 NOTE — Op Note (Signed)
 [facility] Patient Name: Melissa [name] Procedure Date: 08/28/2023 10:56 AM MRN: [id number] Endoscopist: Victory L. Legrand , MD, [id number] Age: 48 Referring MD:  Date of Birth: 1975/02/01 Gender: Female Account #: [id number] Procedure:                Colonoscopy Indications:              Screening for colorectal malignant neoplasm, This                            is the patient's first colonoscopy Medicines:                Monitored Anesthesia Care Procedure:                Pre-Anesthesia Assessment:                           - Prior to the procedure, a History and Physical                            was performed, and patient medications and                            allergies were reviewed. The patient's tolerance of                            previous anesthesia was also reviewed. The risks                            and benefits of the procedure and the sedation                            options and risks were discussed with the patient.                            All questions were answered, and informed consent                            was obtained. Prior Anticoagulants: The patient has                            taken no anticoagulant or antiplatelet agents. ASA                            Grade Assessment: II - A patient with mild systemic                            disease. After reviewing the risks and benefits,                            the patient was deemed in satisfactory condition to                            undergo the procedure.  After obtaining informed consent, the colonoscope                            was passed under direct vision. Throughout the                            procedure, the patient's blood pressure, pulse, and                            oxygen saturations were monitored continuously. The                            Olympus Scope [id number] was introduced through the                            anus and  advanced to the the cecum, identified by                            appendiceal orifice and ileocecal valve. The                            colonoscopy was performed without difficulty. The                            patient tolerated the procedure well. The quality                            of the bowel preparation was poor. The ileocecal                            valve, appendiceal orifice, and rectum were                            photographed. This patient arrived yesterday for a                            colonoscopy with Dr. Nandigam with reportedly                            insufficient prep. She took additional MiraLAX prep                            solution yesterday. Scope In: 10:58:29 AM Scope Out: 11:14:22 AM Scope Withdrawal Time: 0 hours 11 minutes 35 seconds  Total Procedure Duration: 0 hours 15 minutes 53 seconds  Findings:                 The perianal and digital rectal examinations were                            normal.                           Semi-liquid stool was found in the entire colon,  interfering with visualization. Prep remained poor                            in most areas with adherent material, opaque liquid                            and fibrous debris despite extensive lavage.                           Two sessile polyps were found in the transverse                            colon. The polyps were 6 mm in size. These polyps                            were removed with a cold snare. Resection and                            retrieval were complete.                           The exam was otherwise without abnormality on                            direct and retroflexion views. Complications:            No immediate complications. Estimated Blood Loss:     Estimated blood loss was minimal. Impression:               - Preparation of the colon was poor.                           - Stool in the entire examined colon.                            - Two 6 mm polyps in the transverse colon, removed                            with a cold snare. Resected and retrieved.                           - The examination was otherwise normal on direct                            and retroflexion views. Recommendation:           - Patient has a contact number available for                            emergencies. The signs and symptoms of potential                            delayed complications were discussed with the  patient. Return to normal activities tomorrow.                            Written discharge instructions were provided to the                            patient.                           - Resume previous diet.                           - Continue present medications.                           - Await pathology results.                           - Repeat colonoscopy in 1 year (with Dr. Shila,                            this patient's primary GI physician) for                            surveillance and poor quality bowel prep. Split                            dose GoLytely prep recommended for next exam. Melissa [name] L. Legrand, MD 08/28/2023 11:21:05 AM This report has been signed electronically.

## 2023-08-28 NOTE — Progress Notes (Signed)
 History and Physical:  This patient presents for endoscopic testing for: Encounter Diagnosis  Name Primary?   Special screening for malignant neoplasms, colon Yes    48 year old woman here today for screening colonoscopy.  She is a patient of Dr. Shila and arrived yesterday for screening colonoscopy with that physician, but was insufficiently prepped after taking Suprep solution.  Therefore she was sent home on a clear liquid diet with instructions to take additional MiraLAX prep overnight and early today for her exam. Patient denies chronic abdominal pain, rectal bleeding, constipation or diarrhea.   Patient is otherwise without complaints or active issues today.   Past Medical History: Past Medical History:  Diagnosis Date   Allergy     Anxiety    Arthritis    Carpal tunnel syndrome    Depression    GERD (gastroesophageal reflux disease)    Hammer toe 04/2013   bilateral 4th and 5th    Hyperlipidemia    Hypertension    Ingrown left big toenail 04/2013   Mental disorder    Neuromuscular disorder (HCC)    Post term pregnancy over 40 weeks 09/21/2015   Prediabetes    SVD (spontaneous vaginal delivery) 09/22/2015   UTI (lower urinary tract infection)      Past Surgical History: Past Surgical History:  Procedure Laterality Date   BREAST LIFT Bilateral    CARPAL TUNNEL RELEASE     CORRECTION HAMMER TOE Bilateral    CRYOABLATION  2004   CYSTOSCOPY WITH HYDRODISTENSION AND BIOPSY  07/28/2004   with urethral dilatation   DILATION AND CURETTAGE OF UTERUS     DILATION AND EVACUATION  12/21/2011   Procedure: DILATATION AND EVACUATION;  Surgeon: Curlee VEAR Guan, MD;  Location: [facility];  Service: Gynecology;  Laterality: N/A;  first trimester    ESOPHAGEAL MANOMETRY N/A 02/10/2021   Procedure: ESOPHAGEAL MANOMETRY (EM);  Surgeon: Shila Gustav GAILS, MD;  Location: [facility];  Service: Endoscopy;  Laterality: N/A;   HAMMER TOE SURGERY Bilateral  04/20/2013   Procedure: BILATERAL HAMMER TOE REPAIR AND EXCISION NAIL PERMANENT LEFT FIRST;  Surgeon: Charlie Failing, DPM;  Location: [facility];  Service: Podiatry;  Laterality: Bilateral;  Hammer toe repair fourth and fifth toes bilateral feet with screw placement fourth toes and partial nail excision left great toe   HYSTEROSALPINGOGRAM     MASS EXCISION Left 03/27/2022   Procedure: EXCISION SOFT TISSUE MASS x2, LEFT UPPER ARM;  Surgeon: Eletha Boas, MD;  Location: [facility];  Service: General;  Laterality: Left;  LMA   REDUCTION MAMMAPLASTY Bilateral 2020   TOE SURGERY     ingrown toenail   WISDOM TOOTH EXTRACTION      Allergies: Allergies  Allergen Reactions   Bactrim  [Sulfamethoxazole -Trimethoprim ] Itching    Outpatient Meds: Current Outpatient Medications  Medication Sig Dispense Refill   ACCU-CHEK GUIDE TEST test strip USE AS DIRECTED ONCE DAILY AS NEEDED 100 strip 0   atomoxetine (STRATTERA) 40 MG capsule Take 40 mg by mouth daily.     Blood Glucose Monitoring Suppl DEVI 1 each by Does not apply route daily as needed. May substitute to any manufacturer covered by patient's insurance. 1 each 0   buPROPion (WELLBUTRIN XL) 150 MG 24 hr tablet Take 450 mg by mouth daily.     DULoxetine (CYMBALTA) 60 MG capsule Take 60 mg by mouth daily.     Lancets Misc. MISC 1 each by Does not apply route daily as needed. May substitute to any  manufacturer covered by [employer]. 100 each 0   levonorgestrel (MIRENA) 20 MCG/DAY IUD 1 each by Intrauterine route once.     lisdexamfetamine (VYVANSE) 40 MG capsule Take 40 mg by mouth every morning.     Na Sulfate-K Sulfate-Mg Sulfate concentrate (SUPREP) 17.5-3.13-1.6 GM/177ML SOLN SMARTSIG:1 Kit(s) By Mouth Once     Salicylic Acid  3 % SHAM Use daily as directed for dry scalp 236 mL 1   selenium  sulfide (SELSUN ) 2.5 % shampoo USE LOTION EXTERNALLY  TO AFFECTED AREA ONCE DAILY AS NEEDED IRRITATION 120 mL 0    tranexamic acid (LYSTEDA) 650 MG TABS tablet Take 650 mg by mouth daily.     triamcinolone  cream (KENALOG ) 0.1 % Apply 1 Application topically as directed.     acetic acid -hydrocortisone  (VOSOL -HC) OTIC solution Place 3 drops into both ears 3 (three) times daily. 10 mL 1   estradiol (CLIMARA - DOSED IN MG/24 HR) 0.05 mg/24hr patch Place 0.05 mg onto the skin once a week. (Patient not taking: No sig reported)     fexofenadine  (ALLEGRA  ALLERGY ) 180 MG tablet Take 1 tablet (180 mg total) by mouth daily. At bedtime for drainage 30 tablet 0   hydrOXYzine  (ATARAX ) 25 MG tablet TAKE 1 TABLET BY MOUTH AT BEDTIME AS NEEDED. (Patient not taking: No sig reported) 30 tablet 1   lisinopril  (ZESTRIL ) 20 MG tablet TAKE 1 TABLET BY MOUTH EVERY DAY (Patient not taking: Reported on 08/28/2023) 90 tablet 3   metFORMIN  (GLUCOPHAGE ) 500 MG tablet Take 1 tablet (500 mg total) by mouth 2 (two) times daily with a meal. 180 tablet 3   Olopatadine  HCl (PATADAY ) 0.2 % SOLN Place 1 drop into both eyes daily as needed. (Patient not taking: No sig reported) 2.5 mL 2   RYALTRIS  665-25 MCG/ACT SUSP Place 2 sprays into the nose in the morning and at bedtime. 29 g 10   semaglutide -weight management (WEGOVY ) 0.25 MG/0.5ML SOAJ SQ injection INJECT 0.25MG  INTO THE SKIN ONE TIME PER WEEK (Patient not taking: Reported on 08/28/2023) 0.5 mL 3   VEOZAH  45 MG TABS TAKE 1 TABLET BY MOUTH EVERY DAY (Patient not taking: Reported on 08/28/2023) 30 tablet 1   XIIDRA 5 % SOLN INSTILL 1 DROP TWICE DAILY INTO EACH EYE (Patient not taking: No sig reported)     Current Facility-Administered Medications  Medication Dose Route Frequency Provider Last Rate Last Admin   0.9 %  sodium chloride  infusion  500 mL Intravenous Continuous Danis, Victory CROME III, MD          ___________________________________________________________________ Objective   Exam:  BP 124/64   Pulse 66   Temp 98 F (36.7 C) (Temporal)   Ht 5' (1.524 m)   Wt 160 lb (72.6 kg)    SpO2 99%   BMI 31.25 kg/m   CV: regular , S1/S2 Resp: clear to auscultation bilaterally, normal RR and effort noted GI: soft, no tenderness, with active bowel sounds.   Assessment: Encounter Diagnosis  Name Primary?   Special screening for malignant neoplasms, colon Yes     Plan: Colonoscopy   The benefits and risks of the planned procedure(s) were described in detail with the patient or (when appropriate) their health care proxy.  Risks were outlined as including, but not limited to, bleeding, infection, perforation, adverse medication reaction leading to cardiac or pulmonary decompensation, pancreatitis (if ERCP).  The limitation of incomplete mucosal visualization was also discussed.  No guarantees or warranties were given.  The patient is appropriate for an  endoscopic procedure in the ambulatory setting.   - Victory Brand, MD

## 2023-08-28 NOTE — Progress Notes (Signed)
 Recall put in for colonoscopy in 1 year due to poor prep w/ Dr. Shila. Golytely prep recommended.

## 2023-08-28 NOTE — Progress Notes (Signed)
 Pt's states no medical or surgical changes since previsit or office visit.

## 2023-08-28 NOTE — Progress Notes (Signed)
 Called to room to assist during endoscopic procedure.  Patient ID and intended procedure confirmed with present staff. Received instructions for my participation in the procedure from the performing physician.

## 2023-08-28 NOTE — Patient Instructions (Signed)
 Educational handout provided to patient related to Polyps  Resume previous diet  Continue present medications  Awaiting pathology results  REPEAT COLONOSCOPY IN 1 YEAR DUE TO POOR PREP  YOU HAD AN ENDOSCOPIC PROCEDURE TODAY AT THE [facility]:   Refer to the procedure report that was given to you for any specific questions about what was found during the examination.  If the procedure report does not answer your questions, please call your gastroenterologist to clarify.  If you requested that your care partner not be given the details of your procedure findings, then the procedure report has been included in a sealed envelope for you to review at your convenience later.  YOU SHOULD EXPECT: Some feelings of bloating in the abdomen. Passage of more gas than usual.  Walking can help get rid of the air that was put into your GI tract during the procedure and reduce the bloating. If you had a lower endoscopy (such as a colonoscopy or flexible sigmoidoscopy) you may notice spotting of blood in your stool or on the toilet paper. If you underwent a bowel prep for your procedure, you may not have a normal bowel movement for a few days.  Please Note:  You might notice some irritation and congestion in your nose or some drainage.  This is from the oxygen used during your procedure.  There is no need for concern and it should clear up in a day or so.  SYMPTOMS TO REPORT IMMEDIATELY:  Following lower endoscopy (colonoscopy or flexible sigmoidoscopy):  Excessive amounts of blood in the stool  Significant tenderness or worsening of abdominal pains  Swelling of the abdomen that is new, acute  Fever of 100F or higher  For urgent or emergent issues, a gastroenterologist can be reached at any hour by calling [phone]. Do not use MyChart messaging for urgent concerns.    DIET:  We do recommend a small meal at first, but then you may proceed to your regular diet.  Drink plenty of fluids  but you should avoid alcoholic beverages for 24 hours.  ACTIVITY:  You should plan to take it easy for the rest of today and you should NOT DRIVE or use heavy machinery until tomorrow (because of the sedation medicines used during the test).    FOLLOW UP: Our staff will call the number listed on your records the next business day following your procedure.  We will call around 7:15- 8:00 am to check on you and address any questions or concerns that you may have regarding the information given to you following your procedure. If we do not reach you, we will leave a message.     If any biopsies were taken you will be contacted by phone or by letter within the next 1-3 weeks.  Please call us  at [phone] if you have not heard about the biopsies in 3 weeks.    SIGNATURES/CONFIDENTIALITY: You and/or your care partner have signed paperwork which will be entered into your electronic medical record.  These signatures attest to the fact that that the information above on your After Visit Summary has been reviewed and is understood.  Full responsibility of the confidentiality of this discharge information lies with you and/or your care-partner.

## 2023-08-29 ENCOUNTER — Encounter (INDEPENDENT_AMBULATORY_CARE_PROVIDER_SITE_OTHER): Payer: Self-pay

## 2023-08-29 ENCOUNTER — Telehealth: Payer: Self-pay | Admitting: *Deleted

## 2023-08-29 NOTE — Telephone Encounter (Signed)
  Follow up Call-     08/28/2023    9:43 AM  Call back number  Post procedure Call Back phone  # [phone]  Permission to leave phone message Yes     Patient questions:  Do you have a fever, pain , or abdominal swelling? No. Pain Score  0 *  Have you tolerated food without any problems? Yes.    Have you been able to return to your normal activities? Yes.    Do you have any questions about your discharge instructions: Diet   No. Medications  No. Follow up visit  No.  Do you have questions or concerns about your Care? No.  Actions: * If pain score is 4 or above: No action needed, pain <4.

## 2023-08-30 LAB — SURGICAL PATHOLOGY

## 2023-09-04 ENCOUNTER — Institutional Professional Consult (permissible substitution): Admitting: Nurse Practitioner

## 2023-09-08 ENCOUNTER — Ambulatory Visit: Payer: Self-pay | Admitting: Gastroenterology

## 2023-09-23 ENCOUNTER — Institutional Professional Consult (permissible substitution): Admitting: Nurse Practitioner

## 2023-09-23 NOTE — Progress Notes (Deleted)
  [name] [address] [place] [address] Phone: [phone] Subjective:    I'm seeing this patient by the request  of:  Sagardia, Emil Schanz, MD  CC:   YEP:Dlagzrupcz  Melissa [name] is a 48 y.o. female coming in with complaint of back and neck pain. OMT on 08/26/2023. Patient states   Medications patient has been prescribed:   Taking:         Reviewed prior external information including notes and imaging from previsou exam, outside providers and external EMR if available.   As well as notes that were available from care everywhere and other healthcare systems.  Past medical history, social, surgical and family history all reviewed in electronic medical record.  No pertanent information unless stated regarding to the chief complaint.   Past Medical History:  Diagnosis Date   Allergy     Anxiety    Arthritis    Carpal tunnel syndrome    Depression    GERD (gastroesophageal reflux disease)    Hammer toe 04/2013   bilateral 4th and 5th    Hyperlipidemia    Hypertension    Ingrown left big toenail 04/2013   Mental disorder    Neuromuscular disorder (HCC)    Post term pregnancy over 40 weeks 09/21/2015   Prediabetes    SVD (spontaneous vaginal delivery) 09/22/2015   UTI (lower urinary tract infection)     Allergies  Allergen Reactions   Bactrim  [Sulfamethoxazole -Trimethoprim ] Itching     Review of Systems:  No headache, visual changes, nausea, vomiting, diarrhea, constipation, dizziness, abdominal pain, skin rash, fevers, chills, night sweats, weight loss, swollen lymph nodes, body aches, joint swelling, chest pain, shortness of breath, mood changes. POSITIVE muscle aches  Objective  There were no vitals taken for this visit.   General: No apparent distress alert and oriented x3 mood and affect normal, dressed appropriately.  HEENT: Pupils equal, extraocular movements intact  Respiratory: Patient's speak in full  sentences and does not appear short of breath  Cardiovascular: No lower extremity edema, non tender, no erythema  Gait MSK:  Back   Osteopathic findings  C2 flexed rotated and side bent right C6 flexed rotated and side bent left T3 extended rotated and side bent right inhaled rib T9 extended rotated and side bent left L2 flexed rotated and side bent right Sacrum right on right       Assessment and Plan:  No problem-specific Assessment & Plan notes found for this encounter.    Nonallopathic problems  Decision today to treat with OMT was based on Physical Exam  After verbal consent patient was treated with HVLA, ME, FPR techniques in cervical, rib, thoracic, lumbar, and sacral  areas  Patient tolerated the procedure well with improvement in symptoms  Patient given exercises, stretches and lifestyle modifications  See medications in patient instructions if given  Patient will follow up in 4-8 weeks             Note: This dictation was prepared with Dragon dictation along with smaller phrase technology. Any transcriptional errors that result from this process are unintentional.

## 2023-09-24 ENCOUNTER — Ambulatory Visit: Admitting: Family Medicine

## 2023-09-30 ENCOUNTER — Ambulatory Visit: Admitting: Nurse Practitioner

## 2023-09-30 ENCOUNTER — Encounter: Payer: Self-pay | Admitting: Nurse Practitioner

## 2023-09-30 VITALS — BP 111/77 | HR 79 | Temp 98.5°F | Ht 61.0 in | Wt 168.0 lb

## 2023-09-30 DIAGNOSIS — E7849 Other hyperlipidemia: Secondary | ICD-10-CM

## 2023-09-30 DIAGNOSIS — E66811 Obesity, class 1: Secondary | ICD-10-CM

## 2023-09-30 DIAGNOSIS — E559 Vitamin D deficiency, unspecified: Secondary | ICD-10-CM

## 2023-09-30 DIAGNOSIS — R7303 Prediabetes: Secondary | ICD-10-CM

## 2023-09-30 DIAGNOSIS — Z6831 Body mass index (BMI) 31.0-31.9, adult: Secondary | ICD-10-CM

## 2023-09-30 NOTE — Progress Notes (Signed)
 Office: [phone]  /  Fax: [phone]   Initial Visit  SIRIA CALANDRO was seen in clinic today to evaluate for obesity. She is interested in losing weight to improve overall health and reduce the risk of weight related complications. She presents today to review program treatment options, initial physical assessment, and evaluation.     She was referred by: PCP  When asked what else they would like to accomplish? She states: Adopt a healthier eating pattern and lifestyle, Improve energy levels and physical activity, Improve existing medical conditions, and Improve quality of life  When asked how has your weight affected you? She states: Has affected self-esteem, Contributed to medical problems, Having fatigue, and Having poor endurance  Some associated conditions: Hypertension, GERD, degenerative disc disease, bipolar disorder, chronic insomnia, chronic pain of both knees, vitamin D  deficiency, prediabetes, hyperlipidemia  Contributing factors: family history of obesity, chronic skipping of meals, and menopause  Weight promoting medications identified: None  Current nutrition plan: None  Current level of physical activity: She is walking on the treadmill 15 mins and running 20-25 minutes or rowing machine 45-50 minutes and doing resistance training 3 days per week   Current or previous pharmacotherapy: Saxenda   Response to medication: Never tried medications   Past medical history includes:   Past Medical History:  Diagnosis Date   Allergy     Anxiety    Arthritis    Carpal tunnel syndrome    Depression    GERD (gastroesophageal reflux disease)    Hammer toe 04/2013   bilateral 4th and 5th    Hyperlipidemia    Hypertension    Ingrown left big toenail 04/2013   Mental disorder    Neuromuscular disorder (HCC)    Post term pregnancy over 40 weeks 09/21/2015   Prediabetes    SVD (spontaneous vaginal delivery) 09/22/2015   UTI (lower urinary tract infection)       Objective:   BP 111/77   Pulse 79   Temp 98.5 F (36.9 C)   Ht 5' 1 (1.549 m)   Wt 168 lb (76.2 kg)   LMP  (LMP Unknown)   SpO2 100%   BMI 31.74 kg/m  She was weighed on the bioimpedance scale: Body mass index is 31.74 kg/m.  Peak Weight:173 lbs , Body Fat%:40.2%, Visceral Fat Rating:9, Weight trend over the last 12 months: Increasing  General:  Alert, oriented and cooperative. Patient is in no acute distress.  Respiratory: Normal respiratory effort, no problems with respiration noted   Gait: able to ambulate independently  Mental Status: Normal mood and affect. Normal behavior. Normal judgment and thought content.   DIAGNOSTIC DATA REVIEWED:  BMET    Component Value Date/Time   NA 140 07/04/2023 1112   K 3.6 07/04/2023 1112   CL 103 07/04/2023 1112   CO2 29 07/04/2023 1112   GLUCOSE 74 07/04/2023 1112   BUN 7 07/04/2023 1112   CREATININE 0.72 07/04/2023 1112   CREATININE 0.67 02/19/2014 1300   CALCIUM 9.3 07/04/2023 1112   GFRNONAA >89 02/19/2014 1300   GFRAA >89 02/19/2014 1300   Lab Results  Component Value Date   HGBA1C 6.3 07/08/2023   HGBA1C 5.3 12/07/2011   No results found for: INSULIN  CBC    Component Value Date/Time   WBC 5.9 07/04/2023 1112   RBC 4.53 07/04/2023 1112   HGB 13.7 07/04/2023 1112   HCT 41.3 07/04/2023 1112   PLT 320.0 07/04/2023 1112   MCV 91.1 07/04/2023 1112   MCH 31.7  09/23/2015 0523   MCHC 33.2 07/04/2023 1112   RDW 13.5 07/04/2023 1112   Iron/TIBC/Ferritin/ %Sat    Component Value Date/Time   IRON 60 02/27/2019 1557   FERRITIN 32.5 02/27/2019 1557   IRONPCTSAT 15.8 (L) 02/27/2019 1557   Lipid Panel     Component Value Date/Time   CHOL 204 (H) 02/08/2021 1535   TRIG 68.0 02/08/2021 1535   HDL 59.80 02/08/2021 1535   CHOLHDL 3 02/08/2021 1535   VLDL 13.6 02/08/2021 1535   LDLCALC 130 (H) 02/08/2021 1535   Hepatic Function Panel     Component Value Date/Time   PROT 7.3 07/04/2023 1112   ALBUMIN 4.2  07/04/2023 1112   AST 25 07/04/2023 1112   ALT 24 07/04/2023 1112   ALKPHOS 65 07/04/2023 1112   BILITOT 0.5 07/04/2023 1112   BILIDIR 0.1 11/07/2016 1122      Component Value Date/Time   TSH 1.56 07/04/2023 1112     Assessment and Plan:   Prediabetes Last A1c was 6.3 on 07/08/2023.  Patient is not currently taking metformin . She has taken Rybelsus  in the past.  Vitamin D  deficiency Last vitamin D  was 21.43 on 04/25/2023.  Patient is not taking vitamin D .  Other hyperlipidemia Will obtain fasting labs at her next visit.  Obesity, Class I, BMI 30-34.9        Obesity Treatment / Action Plan:  Patient will work on garnering support from family and friends to begin weight loss journey. Will work on eliminating or reducing the presence of highly palatable, calorie dense foods in the home. Will complete provided nutritional and psychosocial assessment questionnaire before the next appointment. Will be scheduled for indirect calorimetry to determine resting energy expenditure in a fasting state.  This will allow us  to create a reduced calorie, high-protein meal plan to promote loss of fat mass while preserving muscle mass. Counseled on the health benefits of losing 5%-15% of total body weight. Was counseled on nutritional approaches to weight loss and benefits of reducing processed foods and consuming plant-based foods and high quality protein as part of nutritional weight management. Was counseled on pharmacotherapy and role as an adjunct in weight management.   Obesity Education Performed Today:  She was weighed on the bioimpedance scale and results were discussed and documented in the synopsis.  We discussed obesity as a disease and the importance of a more detailed evaluation of all the factors contributing to the disease.  We discussed the importance of long term lifestyle changes which include nutrition, exercise and behavioral modifications as well as the importance of  customizing this to her specific health and social needs.  We discussed the benefits of reaching a healthier weight to alleviate the symptoms of existing conditions and reduce the risks of the biomechanical, metabolic and psychological effects of obesity.  Adaysha S Graley appears to be in the action stage of change and states they are ready to start intensive lifestyle modifications and behavioral modifications.  30 minutes was spent today on this visit including the above counseling, pre-visit chart review, and post-visit documentation.  Reviewed by clinician on day of visit: allergies, medications, problem list, medical history, surgical history, family history, social history, and previous encounter notes pertinent to obesity diagnosis.    Corean SAUNDERS Taniqua Issa FNP-C

## 2023-10-04 NOTE — Telephone Encounter (Signed)
 Error

## 2023-10-16 ENCOUNTER — Telehealth (INDEPENDENT_AMBULATORY_CARE_PROVIDER_SITE_OTHER): Payer: Self-pay

## 2023-10-16 ENCOUNTER — Encounter: Payer: Self-pay | Admitting: Family Medicine

## 2023-10-16 ENCOUNTER — Ambulatory Visit: Admitting: Family Medicine

## 2023-10-16 VITALS — BP 128/83 | HR 84 | Temp 98.6°F | Ht 61.0 in | Wt 164.0 lb

## 2023-10-16 DIAGNOSIS — E66811 Obesity, class 1: Secondary | ICD-10-CM

## 2023-10-16 DIAGNOSIS — F5104 Psychophysiologic insomnia: Secondary | ICD-10-CM | POA: Diagnosis not present

## 2023-10-16 DIAGNOSIS — E119 Type 2 diabetes mellitus without complications: Secondary | ICD-10-CM | POA: Diagnosis not present

## 2023-10-16 DIAGNOSIS — E559 Vitamin D deficiency, unspecified: Secondary | ICD-10-CM

## 2023-10-16 DIAGNOSIS — Z6830 Body mass index (BMI) 30.0-30.9, adult: Secondary | ICD-10-CM

## 2023-10-16 DIAGNOSIS — F319 Bipolar disorder, unspecified: Secondary | ICD-10-CM

## 2023-10-16 DIAGNOSIS — R0602 Shortness of breath: Secondary | ICD-10-CM | POA: Diagnosis not present

## 2023-10-16 DIAGNOSIS — R5383 Other fatigue: Secondary | ICD-10-CM | POA: Diagnosis not present

## 2023-10-16 MED ORDER — RYBELSUS 3 MG PO TABS: 3.0000 mg | ORAL_TABLET | Freq: Every day | ORAL | 0 refills | Status: DC

## 2023-10-16 NOTE — Progress Notes (Signed)
 At a Glance:  Vitals Temp: 98.6 F (37 C) BP: 128/83 Pulse Rate: 84 SpO2: 99 %   Anthropometric Measurements Height: 5' 1 (1.549 m) Weight: 164 lb (74.4 kg) BMI (Calculated): 31 Starting Weight: 164lb Peak Weight: 173lb   Body Composition  Body Fat %: 38.5 % Fat Mass (lbs): 63.2 lbs Muscle Mass (lbs): 95.6 lbs Total Body Water (lbs): 64 lbs Visceral Fat Rating : 9   Other Clinical Data Fasting: Yes Labs: Yes Today's Visit #: 1 Starting Date: 10/16/23    EKG: Normal sinus rhythm, rate 94.   Indirect Calorimeter completed today shows a VO2 of 234 and a REE of 1613.  Her calculated basal metabolic rate is 8605 thus her basal metabolic rate is better than expected.  Chief Complaint:  Obesity   Subjective:  Melissa [name] (MR# [id number]) is a 48 y.o. female who presents for evaluation and treatment of obesity and related comorbidities.   Melissa [name] is currently in the action stage of change and ready to dedicate time achieving and maintaining a healthier weight. Melissa [name] is interested in becoming our patient and working on intensive lifestyle modifications including (but not limited to) diet and exercise for weight loss.  Melissa [name] has been struggling with her weight. She has been unsuccessful in either losing weight, maintaining weight loss, or reaching her healthy weight goal.  She is on disability, divorced and lives at home w/ her daughter.  She doesn't like to cook and eats out 3 meals/ wk.  Her daughter is a picky eater.  She has been on Saxenda , Rybelsus  in the past and did well.    Melissa [name]'s habits were reviewed today and are as follows: her desired weight loss is 24 lb, she has significant food cravings issues, she skips meals frequently, she is frequently drinking liquids with calories, she frequently makes poor food choices, she has problems with excessive hunger, and she struggles with emotional eating.   Other Fatigue Melissa [name] admits to daytime somnolence and  admits to waking up still tired. Patient has a history of symptoms of daytime fatigue. Svara generally gets 5 hours of sleep per night, and states that she has nightime awakenings. Snoring is not present. Apneic episodes are not present. Epworth Sleepiness Score is 5.   Shortness of Breath Melissa [name] notes increasing shortness of breath with exercising and seems to be worsening over time with weight gain. She notes getting out of breath sooner with activity than she used to. This has gotten worse recently. Melissa [name] denies shortness of breath at rest or orthopnea.   Depression Screen Melissa [name]'s Food and Mood (modified PHQ-9) score was 10.     10/16/2023    9:49 AM  Depression screen PHQ 2/9  Decreased Interest 0  Down, Depressed, Hopeless 1  PHQ - 2 Score 1  Altered sleeping 2  Tired, decreased energy 2  Change in appetite 1  Feeling bad or failure about yourself  0  Trouble concentrating 3  Moving slowly or fidgety/restless 2  Suicidal thoughts 0  PHQ-9 Score 11     Assessment and Plan:   Other Fatigue Melissa [name] does feel that her weight is causing her energy to be lower than it should be. Fatigue may be related to obesity, depression or many other causes. Labs will be ordered, and in the meanwhile, Melissa [name] will focus on self care including making healthy food choices, increasing physical activity and focusing on stress reduction.  Shortness of Breath Melissa [name] does feel that she gets out of breath  more easily that she used to when she exercises. Melissa [name]'s shortness of breath appears to be obesity related and exercise induced. She has agreed to work on weight loss and gradually increase exercise to treat her exercise induced shortness of breath. Will continue to monitor closely.  Melissa [name] had a positive depression screening. Depression is commonly associated with obesity and often results in emotional eating behaviors. We will monitor this closely and work on CBT to help improve the non-hunger eating  patterns. Referral to Psychology may be required if no improvement is seen as she continues in our clinic.    Problem List Items Addressed This Visit     Bipolar depression (HCC) She is taking Wellubtrin XL 450 mg daily, Duloxetine 60 mg daily managed by psychiatry.  She feels like her mood is stable though has some emotional eating tendencies Continue current meds Consider ref to Dr Sharron for CBT     Vitamin D  deficiency Last vitamin D  level from 04/26/2023 low at 21.  She has complaints of fatigue and is currently not taking a vitamin D  supplement.  Recheck level today    Chronic insomnia She has complaints of chronic insomnia averaging 5 hours of sleep at night.  This has started with perimenopause.  She has talked to her PCP about her chronically poor sleep and we discussed its importance for metabolic health.  Will work on improving sleep quality and stress reduction.   Other Visit Diagnoses       SOBOE (shortness of breath on exertion)    -  Primary     Other fatigue       Relevant Orders   EKG 12-Lead (Completed)   VITAMIN D  25 Hydroxy (Vit-D Deficiency, Fractures)   Folate     Type 2 diabetes mellitus without complication, without long-term current use of insulin  (HCC)    She brought in her lab from 06/05/2019 showing an A1c of 7.0, scanned into her chart today.  She had some success on Rybelsus  in the past without adverse side effect.  She is currently not on metformin  and has never tried it.  We discussed the importance of a low sugar/low starch diet, regular exercise and weight reduction.  To get her prescribed diet and begin Rybelsus  3 mg 1 tab 30 minutes before breakfast daily. Patient denies a personal or family history of pancreatitis, medullary thyroid  carcinoma or multiple endocrine neoplasia type II.    Relevant Medications   Semaglutide  (RYBELSUS ) 3 MG TABS    Other Relevant Orders   Lipid panel   Insulin , random   Hemoglobin A1c     Obesity, Class I, BMI 30-34.9          BMI 30.0-30.9,adult           Melissa [name] is currently in the action stage of change and her goal is to get back to weightloss efforts . I recommend Melissa [name] begin the structured treatment plan as follows:  She has agreed to Category 3 Plan  Exercise goals: All adults should avoid inactivity. Some activity is better than none, and adults who participate in any amount of physical activity, gain some health benefits.  Behavioral modification strategies:increasing lean protein intake, decreasing simple carbohydrates, increasing vegetables, increase H2O intake, decrease liquid calories, decreasing eating out, no skipping meals, meal planning and cooking strategies, keeping healthy foods in the home, better snacking choices, avoiding temptations, and decrease junk food   She was informed of the importance of frequent follow-up visits to maximize her success with  intensive lifestyle modifications for her multiple health conditions. She was informed we would discuss her lab results at her next visit unless there is a critical issue that needs to be addressed sooner. Melissa [name] agreed to keep her next visit at the agreed upon time to discuss these results.  Objective:  General: Cooperative, alert, well developed, in no acute distress. HEENT: Conjunctivae and lids unremarkable. Cardiovascular: Regular rhythm.  Lungs: Normal work of breathing. Neurologic: No focal deficits.   Lab Results  Component Value Date   CREATININE 0.72 07/04/2023   BUN 7 07/04/2023   NA 140 07/04/2023   K 3.6 07/04/2023   CL 103 07/04/2023   CO2 29 07/04/2023   Lab Results  Component Value Date   ALT 24 07/04/2023   AST 25 07/04/2023   ALKPHOS 65 07/04/2023   BILITOT 0.5 07/04/2023   Lab Results  Component Value Date   HGBA1C 6.3 07/08/2023   HGBA1C 5.8 04/25/2023   HGBA1C 5.3 05/21/2022   HGBA1C 5.6 02/08/2021   HGBA1C 5.6 01/13/2018   No results found for: INSULIN  Lab Results  Component Value Date   TSH  1.56 07/04/2023   Lab Results  Component Value Date   CHOL 204 (H) 02/08/2021   HDL 59.80 02/08/2021   LDLCALC 130 (H) 02/08/2021   TRIG 68.0 02/08/2021   CHOLHDL 3 02/08/2021   Lab Results  Component Value Date   WBC 5.9 07/04/2023   HGB 13.7 07/04/2023   HCT 41.3 07/04/2023   MCV 91.1 07/04/2023   PLT 320.0 07/04/2023   Lab Results  Component Value Date   IRON 60 02/27/2019   FERRITIN 32.5 02/27/2019    Attestation Statements:  Reviewed by clinician on day of visit: allergies, medications, problem list, medical history, surgical history, family history, social history, and previous encounter notes.  Time spent on visit including pre-visit chart review and post-visit charting and face- to face care including nutritional counseling, review of EKG, interpretation of body composition scale and indirect calorimetry and nutrition prescription  was 43 minutes.   Darice Haddock, D.O. DABFM, DABOM [facility] [address] [place], [place] [address] [phone]

## 2023-10-16 NOTE — Telephone Encounter (Signed)
 Melissa [name] ([name]) Rx #: [id number] Rybelsus  3MG  tablets Form: [employer] Medicare Part D Electronic Prior Authorization

## 2023-10-16 NOTE — Telephone Encounter (Signed)
 Request Reference Number: [id number]. RYBELSUS  TAB 3MG  is approved through 12/31/2024.  Your patient may now fill this prescription and it will be covered.. Authorization Expiration Date: December 31, 2024.

## 2023-10-17 ENCOUNTER — Ambulatory Visit: Payer: Self-pay | Admitting: Family Medicine

## 2023-10-17 LAB — VITAMIN D 25 HYDROXY (VIT D DEFICIENCY, FRACTURES): Vit D, 25-Hydroxy: 27.1 ng/mL — ABNORMAL LOW (ref 30.0–100.0)

## 2023-10-17 LAB — LIPID PANEL
Chol/HDL Ratio: 4.3 ratio (ref 0.0–4.4)
Cholesterol, Total: 228 mg/dL — ABNORMAL HIGH (ref 100–199)
HDL: 53 mg/dL (ref >39)
LDL Chol Calc (NIH): 149 mg/dL — ABNORMAL HIGH (ref 0–99)
Triglycerides: 146 mg/dL (ref 0–149)
VLDL Cholesterol Cal: 26 mg/dL (ref 5–40)

## 2023-10-17 LAB — HEMOGLOBIN A1C
Est. average glucose Bld gHb Est-mCnc: 126 mg/dL
Hgb A1c MFr Bld: 6 % — ABNORMAL HIGH (ref 4.8–5.6)

## 2023-10-17 LAB — FOLATE: Folate: 9.3 ng/mL (ref >3.0)

## 2023-10-17 LAB — INSULIN, RANDOM: INSULIN: 14.6 u[IU]/mL (ref 2.6–24.9)

## 2023-10-28 ENCOUNTER — Ambulatory Visit: Admitting: Family Medicine

## 2023-10-31 NOTE — Progress Notes (Signed)
 Melissa [name] [address] [place] [address] Phone: [phone] Subjective:   [name], am serving as a scribe for Dr. Arthea Claudene.  I'm seeing this patient by the request  of:  Sagardia, Emil Schanz, MD  CC: Back and neck pain.  YEP:Dlagzrupcz  Melissa [name] is a 48 y.o. female coming in with complaint of back and neck pain. OMT 08/26/2023. Patient states that her neck is painful. Feels like she is developing a lump in back of neck and thus wants to discontinue steroid injections. Wants to know what oral medication she can take for her pain.   Wants to have another breast reduction as she knows this is contributing to her pain. Sees surgeon on Nov 24th.   Medications patient has been prescribed: None          Reviewed prior external information including notes and imaging from previsou exam, outside providers and external EMR if available.   As well as notes that were available from care everywhere and other healthcare systems.  Past medical history, social, surgical and family history all reviewed in electronic medical record.  No pertanent information unless stated regarding to the chief complaint.   Past Medical History:  Diagnosis Date   ADHD    Allergy     Anxiety    Arthritis    Carpal tunnel syndrome    Depression    GERD (gastroesophageal reflux disease)    Hammer toe 04/2013   bilateral 4th and 5th    Hyperlipidemia    Hypertension    Ingrown left big toenail 04/2013   Mental disorder    Neuromuscular disorder (HCC)    Post term pregnancy over 40 weeks 09/21/2015   Pre-diabetes    Prediabetes    Rheumatoid arthritis (HCC)    SVD (spontaneous vaginal delivery) 09/22/2015   UTI (lower urinary tract infection)    Vitamin D  deficiency     Allergies  Allergen Reactions   Bactrim  [Sulfamethoxazole -Trimethoprim ] Itching     Review of Systems:  No headache, visual changes, nausea, vomiting, diarrhea,  constipation, dizziness, abdominal pain, skin rash, fevers, chills, night sweats, weight loss, swollen lymph nodes, body aches, joint swelling, chest pain, shortness of breath, mood changes. POSITIVE muscle aches  Objective  Blood pressure 118/86, pulse 93, height 5' 1 (1.549 m), weight 165 lb (74.8 kg), SpO2 99%.   General: No apparent distress alert and oriented x3 mood and affect normal, dressed appropriately.  HEENT: Pupils equal, extraocular movements intact  Respiratory: Patient's speak in full sentences and does not appear short of breath  Cardiovascular: No lower extremity edema, non tender, no erythema  Patient is in significant amount of breast tissue causing patient to have anterior displacement of the shoulder somewhat.  Patient does have tightness noted in the parascapular area.  Lipoma noted mostly near the midline at the cervical thoracic area.  Tender and freely movable.  Osteopathic findings  C2 flexed rotated and side bent right C6 flexed rotated and side bent left T2 extended rotated and side bent right inhaled rib T7 extended rotated and side bent left L2 flexed rotated and side bent right Sacrum right on right       Assessment and Plan:  Degenerative disc disease, cervical Known degenerative disc disease.  Discussed icing regimen and home exercises, discussed we could consider the possibility of an epidural again.  Patient does have a lipoma noted on the posterior aspect of the neck at the moment it  appears.  Will continue to monitor.  Discussed which activities to do and which ones to avoid.  Follow-up again in 6 to 12 weeks.  Large breasts Large breasts could be potentially giving her difficulty.  Has already had 1 breast reduction multiple years ago and then had pregnancy and did breast-feeding.  I do feel that the amount of breast tissue could be potentially contributing to some of her increasing neck pain and aches and pains in the upper thoracic spine that is  affecting daily activities regularly.  Follow-up again in 6 to 12 weeks    Nonallopathic problems  Decision today to treat with OMT was based on Physical Exam  After verbal consent patient was treated with HVLA, ME, FPR techniques in cervical, rib, thoracic, lumbar, and sacral  areas  Patient tolerated the procedure well with improvement in symptoms  Patient given exercises, stretches and lifestyle modifications  See medications in patient instructions if given  Patient will follow up in 4-8 weeks  Prescription for Celebrex  200 mg twice a day given, discussed icing regimen and home exercises, Toradol  and Depo-Medrol  injections given as well today.   The above documentation has been reviewed and is accurate and complete Gus Littler M Khaylee Mcevoy, DO         Note: This dictation was prepared with Dragon dictation along with smaller phrase technology. Any transcriptional errors that result from this process are unintentional.

## 2023-11-04 ENCOUNTER — Encounter: Payer: Self-pay | Admitting: Family Medicine

## 2023-11-04 ENCOUNTER — Ambulatory Visit: Admitting: Family Medicine

## 2023-11-04 VITALS — BP 118/86 | HR 93 | Ht 61.0 in | Wt 165.0 lb

## 2023-11-04 DIAGNOSIS — M9902 Segmental and somatic dysfunction of thoracic region: Secondary | ICD-10-CM

## 2023-11-04 DIAGNOSIS — M9908 Segmental and somatic dysfunction of rib cage: Secondary | ICD-10-CM

## 2023-11-04 DIAGNOSIS — M5030 Other cervical disc degeneration, unspecified cervical region: Secondary | ICD-10-CM | POA: Diagnosis not present

## 2023-11-04 DIAGNOSIS — M9901 Segmental and somatic dysfunction of cervical region: Secondary | ICD-10-CM | POA: Diagnosis not present

## 2023-11-04 DIAGNOSIS — M5441 Lumbago with sciatica, right side: Secondary | ICD-10-CM

## 2023-11-04 DIAGNOSIS — G8929 Other chronic pain: Secondary | ICD-10-CM

## 2023-11-04 DIAGNOSIS — M9903 Segmental and somatic dysfunction of lumbar region: Secondary | ICD-10-CM

## 2023-11-04 DIAGNOSIS — M9904 Segmental and somatic dysfunction of sacral region: Secondary | ICD-10-CM

## 2023-11-04 DIAGNOSIS — N62 Hypertrophy of breast: Secondary | ICD-10-CM

## 2023-11-04 MED ORDER — CELECOXIB 100 MG PO CAPS: 100.0000 mg | ORAL_CAPSULE | Freq: Two times a day (BID) | ORAL | 0 refills | Status: DC

## 2023-11-04 MED ORDER — METHYLPREDNISOLONE ACETATE 80 MG/ML IJ SUSP
80.0000 mg | Freq: Once | INTRAMUSCULAR | Status: AC
  Administered 2023-11-04: 80 mg via INTRAMUSCULAR

## 2023-11-04 MED ORDER — KETOROLAC TROMETHAMINE 60 MG/2ML IM SOLN
60.0000 mg | Freq: Once | INTRAMUSCULAR | Status: AC
  Administered 2023-11-04: 60 mg via INTRAMUSCULAR

## 2023-11-04 NOTE — Assessment & Plan Note (Signed)
 Large breasts could be potentially giving her difficulty.  Has already had 1 breast reduction multiple years ago and then had pregnancy and did breast-feeding.  I do feel that the amount of breast tissue could be potentially contributing to some of her increasing neck pain and aches and pains in the upper thoracic spine that is affecting daily activities regularly.  Follow-up again in 6 to 12 weeks

## 2023-11-04 NOTE — Patient Instructions (Signed)
 Celebrex  100mg  BID Continue medications Talk to surgeon for breast reduction See me in 2 months

## 2023-11-04 NOTE — Assessment & Plan Note (Signed)
 Known degenerative disc disease.  Discussed icing regimen and home exercises, discussed we could consider the possibility of an epidural again.  Patient does have a lipoma noted on the posterior aspect of the neck at the moment it appears.  Will continue to monitor.  Discussed which activities to do and which ones to avoid.  Follow-up again in 6 to 12 weeks.

## 2023-11-11 ENCOUNTER — Other Ambulatory Visit: Payer: Self-pay | Admitting: Family Medicine

## 2023-11-11 ENCOUNTER — Other Ambulatory Visit: Payer: Self-pay | Admitting: Emergency Medicine

## 2023-11-11 DIAGNOSIS — E119 Type 2 diabetes mellitus without complications: Secondary | ICD-10-CM

## 2023-11-14 ENCOUNTER — Ambulatory Visit (INDEPENDENT_AMBULATORY_CARE_PROVIDER_SITE_OTHER): Admitting: Family Medicine

## 2023-11-14 ENCOUNTER — Encounter: Payer: Self-pay | Admitting: Family Medicine

## 2023-11-14 VITALS — BP 117/80 | HR 111 | Temp 98.8°F | Ht 61.0 in | Wt 161.0 lb

## 2023-11-14 DIAGNOSIS — Z6830 Body mass index (BMI) 30.0-30.9, adult: Secondary | ICD-10-CM

## 2023-11-14 DIAGNOSIS — E119 Type 2 diabetes mellitus without complications: Secondary | ICD-10-CM | POA: Diagnosis not present

## 2023-11-14 DIAGNOSIS — E559 Vitamin D deficiency, unspecified: Secondary | ICD-10-CM

## 2023-11-14 DIAGNOSIS — E66811 Obesity, class 1: Secondary | ICD-10-CM

## 2023-11-14 DIAGNOSIS — F319 Bipolar disorder, unspecified: Secondary | ICD-10-CM | POA: Diagnosis not present

## 2023-11-14 DIAGNOSIS — Z7984 Long term (current) use of oral hypoglycemic drugs: Secondary | ICD-10-CM

## 2023-11-14 MED ORDER — RYBELSUS 7 MG PO TABS: 7.0000 mg | ORAL_TABLET | Freq: Every day | ORAL | 1 refills | Status: DC

## 2023-11-14 MED ORDER — VITAMIN D (ERGOCALCIFEROL) 1.25 MG (50000 UNIT) PO CAPS: 50000.0000 [IU] | ORAL_CAPSULE | ORAL | 0 refills | Status: DC

## 2023-11-14 NOTE — Patient Instructions (Signed)
 Go up on Rybelsus  to 7 mg daily, 30 min before breakfast on empty stomach  Add in resistance training 2-3 days/ wk Aim for 30 min of walking daily  Begin RX vitamin D  once a week Stay on a Multivitamin Daily  Great job with food choices, mindful eating and improved blood sugar readings!

## 2023-11-14 NOTE — Progress Notes (Signed)
 Office: [phone]  /  Fax: [phone]  WEIGHT SUMMARY AND BIOMETRICS  Starting Date: 10/16/23  Starting Weight: 164lb   Weight Lost Since Last Visit: 3lb   Vitals Temp: 98.8 F (37.1 C) BP: 117/80 Pulse Rate: (!) 111 SpO2: 97 %   Body Composition  Body Fat %: 39.8 % Fat Mass (lbs): 64.2 lbs Muscle Mass (lbs): 92.2 lbs Total Body Water (lbs): 65.4 lbs Visceral Fat Rating : 9   HPI  Chief Complaint: OBESITY  Melissa [name] is here to discuss her progress with her obesity treatment plan. She is on the the Category 3 Plan and states she is following her eating plan approximately 70 % of the time. She states she is exercising 45-60 minutes 3 times per week.  Interval History:  Since last office visit she is down 3 lb She is down 3.4 lb of muscle mass since last visit She is doing mostly cardio for exercise 3 days/ wk She did start on Rybelsus  3 mg daily without much improvement in appetite She is getting in most of the food on her plan She has cut back on nighttime snacking, carbs and sugar Denies excess hunger or cravings  Pharmacotherapy: Rybelsus  3 mg once daily  PHYSICAL EXAM:  Blood pressure 117/80, pulse (!) 111, temperature 98.8 F (37.1 C), height 5' 1 (1.549 m), weight 161 lb (73 kg), SpO2 97%. Body mass index is 30.42 kg/m.  General: She is healthy appearing, cooperative, alert, well developed, and in no acute distress. PSYCH: Has normal mood, affect and thought process.   Lungs: Normal breathing effort, no conversational dyspnea.  ASSESSMENT AND PLAN  TREATMENT PLAN FOR OBESITY:  Recommended Dietary Goals  Melissa [name] is currently in the action stage of change. As such, her goal is to continue weight management plan. She has agreed to the Category 3 Plan.  Behavioral Intervention  We discussed the following Behavioral Modification Strategies today: increasing lean protein intake to established goals, increasing fiber rich foods, increasing water  intake , work on meal planning and preparation, identifying sources and decreasing liquid calories, avoiding temptations and identifying enticing environmental cues, continue to practice mindfulness when eating, and planning for success.  Additional resources provided today: NA  Recommended Physical Activity Goals  Melissa [name] has been advised to work up to 150 minutes of moderate intensity aerobic activity a week and strengthening exercises 2-3 times per week for cardiovascular health, weight loss maintenance and preservation of muscle mass.   She has agreed to [employer] strengthening exercises with a goal of 2-3 sessions a week  Aim for 30 min of walking daily AND ADD 2-3 days/ wk of resistance training  Pharmacotherapy changes for the treatment of obesity: increase Ryblesus to 7 mg daily  ASSOCIATED CONDITIONS ADDRESSED TODAY  Type 2 diabetes mellitus without complication, without long-term current use of insulin  (HCC) Lab Results  Component Value Date   HGBA1C 6.0 (H) 10/16/2023   Reviewed labs with patient Good control of T2DM and tolerating new start Rybelsus  well Has room to ramp to 7 mg dose Doing well with improved AM fasting glucose readings, down from previous 140's--> low 100's Has increased walking time  -     Rybelsus ; Take 1 tablet (7 mg total) by mouth daily.  Dispense: 30 tablet; Refill: 1  Vitamin D  deficiency Last vitamin D  Lab Results  Component Value Date   VD25OH 27.1 (L) 10/16/2023  New.  Reviewed labs with patient C/o fatigue.  Discussed the importance of treatment due to fatigue, poor  immune function and the risk for bone loss.  Begin: -     Vitamin D  (Ergocalciferol ); Take 1 capsule (50,000 Units total) by mouth every 7 (seven) days.  Dispense: 12 capsule; Refill: 0 Repeat lab in 3 mos  Obesity, Class I, BMI 30-34.9 Improving with recent muscle loss Ensure adequate protein intake with meals Snack calories can be optional (she is often not needing them) Ramp  up resistance training  BMI 30.0-30.9,adult  Bipolar depression (HCC) Stable mood on current meds No longer struggling with emotional eating Will hold off on adding CBT     She was informed of the importance of frequent follow up visits to maximize her success with intensive lifestyle modifications for her multiple health conditions.   ATTESTASTION STATEMENTS:  Reviewed by clinician on day of visit: allergies, medications, problem list, medical history, surgical history, family history, social history, and previous encounter notes pertinent to obesity diagnosis.   I have personally spent 30 minutes total time today in preparation, patient care, nutritional counseling and education,  and documentation for this visit, including the following: review of most recent clinical lab tests, prescribing medications/ refilling medications, reviewing medical assistant documentation, review and interpretation of bioimpedence results.     Darice Haddock, D.O. DABFM, DABOM [facility] [address] [place], [place] [address] [phone]

## 2023-11-19 ENCOUNTER — Ambulatory Visit: Admitting: Family Medicine

## 2023-11-25 ENCOUNTER — Ambulatory Visit: Admitting: Plastic Surgery

## 2023-11-25 VITALS — BP 125/85 | HR 96 | Ht 61.0 in | Wt 164.0 lb

## 2023-11-25 DIAGNOSIS — M549 Dorsalgia, unspecified: Secondary | ICD-10-CM | POA: Diagnosis not present

## 2023-11-25 DIAGNOSIS — N62 Hypertrophy of breast: Secondary | ICD-10-CM

## 2023-11-25 DIAGNOSIS — Z6831 Body mass index (BMI) 31.0-31.9, adult: Secondary | ICD-10-CM

## 2023-11-25 DIAGNOSIS — M546 Pain in thoracic spine: Secondary | ICD-10-CM | POA: Diagnosis not present

## 2023-11-25 DIAGNOSIS — G8929 Other chronic pain: Secondary | ICD-10-CM

## 2023-11-25 DIAGNOSIS — M5030 Other cervical disc degeneration, unspecified cervical region: Secondary | ICD-10-CM

## 2023-11-25 NOTE — Progress Notes (Signed)
 Patient ID: Melissa [name], female    DOB: [birth year], 48 y.o.   MRN: [id number]   Chief Complaint  Patient presents with   Advice Only   Skin Problem    Mammary Hyperplasia: The patient is a 48 y.o. female with a history of mammary hyperplasia for several years.  She has extremely large breasts causing symptoms that include the following: Back pain in the upper and lower back, including neck pain. She pulls or pins her bra straps to provide better lift and relief of the pressure and pain. She notices relief by holding her breast up manually.  Her shoulder straps cause grooves and pain and pressure that requires padding for relief. Pain medication is sometimes required with motrin  and tylenol .  Activities that are hindered by enlarged breasts include: exercise and running.  She has tried supportive clothing as well as fitted bras without improvement.  Her breasts are extremely large and fairly symmetric.  She has hyperpigmentation of the inframammary area on both sides.  The sternal to nipple distance on the right is 23 cm and the left is 24 cm.  She is 5 feet 1 inches tall and weighs 164 pounds.  The BMI = 31 kg/m.  Preoperative bra size = she is not sure.  The estimated excess breast tissue to be removed at the time of surgery = 350-400 grams on the left and 350-400 grams on the right.  Mammogram history: 2/25 negative.  Family history of breast cancer:  none.  Tobacco use:  none.   The patient expresses the desire to pursue surgical intervention.  The patient has a history of obesity, bipolar, arthritis, degenerative disc disease in her cervical spine and rheumatoid arthritis.  Her past surgical history includes a breast reduction in [place], carpal tunnel, wisdom teeth and orthopedic surgery.  The majority of her complaint is in the lateral aspect of the breast.  She will need extra lipo in that area and all of the skin cannot be removed.     Review of Systems  Constitutional:   Positive for activity change.  Eyes: Negative.   Respiratory: Negative.    Cardiovascular: Negative.   Gastrointestinal: Negative.   Endocrine: Negative.   Genitourinary: Negative.   Musculoskeletal:  Positive for back pain and neck pain.    Past Medical History:  Diagnosis Date   ADHD    Allergy     Anxiety    Arthritis    Carpal tunnel syndrome    Depression    GERD (gastroesophageal reflux disease)    Hammer toe 04/2013   bilateral 4th and 5th    Hyperlipidemia    Hypertension    Ingrown left big toenail 04/2013   Mental disorder    Neuromuscular disorder (HCC)    Post term pregnancy over 40 weeks 09/21/2015   Pre-diabetes    Prediabetes    Rheumatoid arthritis (HCC)    SVD (spontaneous vaginal delivery) 09/22/2015   UTI (lower urinary tract infection)    Vitamin D  deficiency     Past Surgical History:  Procedure Laterality Date   BREAST LIFT Bilateral    CARPAL TUNNEL RELEASE     CORRECTION HAMMER TOE Bilateral    CRYOABLATION  2004   CYSTOSCOPY WITH HYDRODISTENSION AND BIOPSY  07/28/2004   with urethral dilatation   DILATION AND CURETTAGE OF UTERUS     DILATION AND EVACUATION  12/21/2011   Procedure: DILATATION AND EVACUATION;  Surgeon: Curlee VEAR Guan, MD;  Location: [facility];  Service: Gynecology;  Laterality: N/A;  first trimester    ESOPHAGEAL MANOMETRY N/A 02/10/2021   Procedure: ESOPHAGEAL MANOMETRY (EM);  Surgeon: Shila Gustav GAILS, MD;  Location: [facility];  Service: Endoscopy;  Laterality: N/A;   HAMMER TOE SURGERY Bilateral 04/20/2013   Procedure: BILATERAL HAMMER TOE REPAIR AND EXCISION NAIL PERMANENT LEFT FIRST;  Surgeon: Charlie Failing, DPM;  Location: [facility];  Service: Podiatry;  Laterality: Bilateral;  Hammer toe repair fourth and fifth toes bilateral feet with screw placement fourth toes and partial nail excision left great toe   HYSTEROSALPINGOGRAM     MASS EXCISION Left 03/27/2022   Procedure: EXCISION  SOFT TISSUE MASS x2, LEFT UPPER ARM;  Surgeon: Eletha Boas, MD;  Location:  SURGERY CENTER;  Service: General;  Laterality: Left;  LMA   REDUCTION MAMMAPLASTY Bilateral 2020   TOE SURGERY     ingrown toenail   WISDOM TOOTH EXTRACTION        Current Outpatient Medications:    ACCU-CHEK GUIDE TEST test strip, USE AS DIRECTED ONCE DAILY AS NEEDED, Disp: 100 strip, Rfl: 0   acetic acid -hydrocortisone  (VOSOL -HC) OTIC solution, Place 3 drops into both ears 3 (three) times daily., Disp: 10 mL, Rfl: 1   atomoxetine (STRATTERA) 40 MG capsule, Take 40 mg by mouth daily., Disp: , Rfl:    Blood Glucose Monitoring Suppl DEVI, 1 each by Does not apply route daily as needed. May substitute to any manufacturer covered by patient's insurance., Disp: 1 each, Rfl: 0   buPROPion (WELLBUTRIN XL) 150 MG 24 hr tablet, Take 450 mg by mouth daily., Disp: , Rfl:    celecoxib  (CELEBREX ) 100 MG capsule, TAKE 1 CAPSULE BY MOUTH TWICE A DAY, Disp: 60 capsule, Rfl: 0   DULoxetine (CYMBALTA) 60 MG capsule, Take 60 mg by mouth daily., Disp: , Rfl:    fexofenadine  (ALLEGRA  ALLERGY ) 180 MG tablet, Take 1 tablet (180 mg total) by mouth daily. At bedtime for drainage, Disp: 30 tablet, Rfl: 0   Lancets Misc. MISC, 1 each by Does not apply route daily as needed. May substitute to any manufacturer covered by patient's insurance., Disp: 100 each, Rfl: 0   levonorgestrel (MIRENA) 20 MCG/DAY IUD, 1 each by Intrauterine route once., Disp: , Rfl:    lisdexamfetamine (VYVANSE) 40 MG capsule, Take 40 mg by mouth every morning., Disp: , Rfl:    Na Sulfate-K Sulfate-Mg Sulfate concentrate (SUPREP) 17.5-3.13-1.6 GM/177ML SOLN, SMARTSIG:1 Kit(s) By Mouth Once, Disp: , Rfl:    RYALTRIS  665-25 MCG/ACT SUSP, Place 2 sprays into the nose in the morning and at bedtime., Disp: 29 g, Rfl: 10   Salicylic Acid  3 % SHAM, Use daily as directed for dry scalp, Disp: 236 mL, Rfl: 1   selenium  sulfide (SELSUN ) 2.5 % shampoo, USE LOTION EXTERNALLY   TO AFFECTED AREA ONCE DAILY AS NEEDED IRRITATION, Disp: 120 mL, Rfl: 0   Semaglutide  (RYBELSUS ) 7 MG TABS, Take 1 tablet (7 mg total) by mouth daily., Disp: 30 tablet, Rfl: 1   tranexamic acid (LYSTEDA) 650 MG TABS tablet, Take 650 mg by mouth daily., Disp: , Rfl:    triamcinolone  cream (KENALOG ) 0.1 %, Apply 1 Application topically as directed., Disp: , Rfl:    Vitamin D , Ergocalciferol , (DRISDOL ) 1.25 MG (50000 UNIT) CAPS capsule, Take 1 capsule (50,000 Units total) by mouth every 7 (seven) days., Disp: 12 capsule, Rfl: 0   hydrOXYzine  (ATARAX ) 25 MG tablet, TAKE 1 TABLET BY MOUTH AT BEDTIME AS NEEDED. (Patient  not taking: Reported on 11/25/2023), Disp: 30 tablet, Rfl: 1   lisinopril  (ZESTRIL ) 20 MG tablet, TAKE 1 TABLET BY MOUTH EVERY DAY (Patient not taking: Reported on 11/25/2023), Disp: 90 tablet, Rfl: 3   Olopatadine  HCl (PATADAY ) 0.2 % SOLN, Place 1 drop into both eyes daily as needed. (Patient not taking: Reported on 11/25/2023), Disp: 2.5 mL, Rfl: 2   VEOZAH  45 MG TABS, TAKE 1 TABLET BY MOUTH EVERY DAY (Patient not taking: Reported on 11/25/2023), Disp: 30 tablet, Rfl: 1   XIIDRA 5 % SOLN, INSTILL 1 DROP TWICE DAILY INTO EACH EYE (Patient not taking: Reported on 11/25/2023), Disp: , Rfl:    Objective:   Vitals:   11/25/23 1341  BP: 125/85  Pulse: 96  SpO2: 98%    Physical Exam Vitals reviewed.  Constitutional:      Appearance: Normal appearance.  HENT:     Head: Atraumatic.  Cardiovascular:     Rate and Rhythm: Normal rate.     Pulses: Normal pulses.  Pulmonary:     Effort: Pulmonary effort is normal.  Abdominal:     Palpations: Abdomen is soft.  Skin:    General: Skin is warm.     Capillary Refill: Capillary refill takes less than 2 seconds.  Neurological:     Mental Status: She is alert and oriented to person, place, and time.  Psychiatric:        Mood and Affect: Mood normal.        Behavior: Behavior normal.        Thought Content: Thought content normal.         Judgment: Judgment normal.     Assessment & Plan:  Large breasts  Chronic bilateral thoracic back pain  Degenerative disc disease, cervical  The procedure the patient selected and that was best for the patient was discussed. The risk were discussed and include but not limited to the following:  Breast asymmetry, fluid accumulation, firmness of the breast, inability to breast feed, loss of nipple or areola, skin loss, change in skin and nipple sensation, fat necrosis of the breast tissue, bleeding, infection and healing delay.  There are risks of anesthesia and injury to nerves or blood vessels.  Allergic reaction to tape, suture and skin glue are possible.  There will be swelling.  Any of these can lead to the need for revisional surgery which is not included in this surgery.  A breast reduction has potential to interfere with diagnostic procedures in the future.  This procedure is best done when the breast is fully developed.  Changes in the breast will continue to occur over time: pregnancy, weight gain or weigh loss. No guarantees are given for a certain bra or breast size.    Total time: 40 minutes. This includes time spent with the patient during the visit as well as time spent before and after the visit reviewing the chart, documenting the encounter, ordering pertinent studies and literature for the patient.   Physical therapy:  not required Mammogram:  will need MM if surgery after 2/26  The patient is a candidate for bilateral breast reduction with liposuction.  She is going to try to get a hold of the op report from [place].  I will probably not feel comfortable doing the surgery if she cannot get the operative report.  There is no way to know where her pedicle is.  I explained that it is most likely it either inferior or superior medial but there is no way  to know based on her scars where the pedicle is located.  When she calls back with the op note we can talk further.  Pictures were  obtained of the patient and placed in the chart with the patient's or guardian's permission.   Estefana RAMAN Shalaya Swailes, DO

## 2023-11-26 ENCOUNTER — Other Ambulatory Visit: Payer: Self-pay

## 2023-11-26 ENCOUNTER — Encounter: Payer: Self-pay | Admitting: Family Medicine

## 2023-11-26 ENCOUNTER — Encounter: Payer: Self-pay | Admitting: Emergency Medicine

## 2023-11-26 DIAGNOSIS — K219 Gastro-esophageal reflux disease without esophagitis: Secondary | ICD-10-CM

## 2023-11-26 DIAGNOSIS — M549 Dorsalgia, unspecified: Secondary | ICD-10-CM

## 2023-11-26 DIAGNOSIS — N62 Hypertrophy of breast: Secondary | ICD-10-CM

## 2023-11-26 NOTE — Telephone Encounter (Signed)
 Mucous overproduction is taken care of with expectorants.  The only one available is Mucinex which has guaifenesin.  This is an over-the-counter medication she can get anywhere.

## 2023-11-26 NOTE — Telephone Encounter (Signed)
 Nothing else available

## 2023-11-26 NOTE — Telephone Encounter (Signed)
 Please advise

## 2023-11-27 NOTE — Addendum Note (Signed)
 Addended by: ROSALVA LEX RAMAN on: 11/27/2023 08:43 AM   Modules accepted: Orders

## 2023-11-27 NOTE — Telephone Encounter (Signed)
 Please advise

## 2023-11-27 NOTE — Telephone Encounter (Signed)
 Place referral to GI Dr. Please.  Thanks.

## 2023-11-27 NOTE — Telephone Encounter (Signed)
Referral has been placed to GI.

## 2023-12-02 ENCOUNTER — Telehealth: Payer: Self-pay

## 2023-12-02 NOTE — Telephone Encounter (Signed)
 Dr. Creola from plastic surgeries office called and they do not take medicaid or medicare. Not sure next steps

## 2023-12-10 ENCOUNTER — Ambulatory Visit: Admitting: Family Medicine

## 2023-12-10 ENCOUNTER — Encounter: Payer: Self-pay | Admitting: Plastic Surgery

## 2023-12-10 ENCOUNTER — Telehealth: Admitting: Plastic Surgery

## 2023-12-10 DIAGNOSIS — N62 Hypertrophy of breast: Secondary | ICD-10-CM

## 2023-12-10 NOTE — Progress Notes (Signed)
 The patient is a 48 year old female joining me by phone.  She was at home and I was at the office.  We spent less than 5 minutes in discussion.  She has not been able to get the operative report from [place] yet.  She is still working on it.  She is going to give us  a call when she gets it.  I will wait to hear back from her.

## 2023-12-12 ENCOUNTER — Ambulatory Visit: Admitting: Family Medicine

## 2023-12-12 ENCOUNTER — Encounter: Payer: Self-pay | Admitting: Family Medicine

## 2023-12-12 VITALS — BP 115/83 | HR 74 | Temp 98.0°F | Ht 61.0 in | Wt 159.0 lb

## 2023-12-12 DIAGNOSIS — E119 Type 2 diabetes mellitus without complications: Secondary | ICD-10-CM

## 2023-12-12 DIAGNOSIS — Z6830 Body mass index (BMI) 30.0-30.9, adult: Secondary | ICD-10-CM

## 2023-12-12 DIAGNOSIS — E559 Vitamin D deficiency, unspecified: Secondary | ICD-10-CM | POA: Diagnosis not present

## 2023-12-12 DIAGNOSIS — E66811 Obesity, class 1: Secondary | ICD-10-CM

## 2023-12-12 DIAGNOSIS — Z7984 Long term (current) use of oral hypoglycemic drugs: Secondary | ICD-10-CM | POA: Diagnosis not present

## 2023-12-12 MED ORDER — VITAMIN D (ERGOCALCIFEROL) 1.25 MG (50000 UNIT) PO CAPS: 50000.0000 [IU] | ORAL_CAPSULE | ORAL | 0 refills | Status: AC

## 2023-12-12 MED ORDER — RYBELSUS 14 MG PO TABS: 14.0000 mg | ORAL_TABLET | Freq: Every day | ORAL | 1 refills | Status: AC

## 2023-12-12 NOTE — Progress Notes (Signed)
 Office: [phone]  /  Fax: [phone]  WEIGHT SUMMARY AND BIOMETRICS  Starting Date: 10/16/23  Starting Weight: 164lb   Weight Lost Since Last Visit: 2lb   Vitals Temp: 98 F (36.7 C) BP: 115/83 Pulse Rate: 74 SpO2: 98 %   Body Composition  Body Fat %: 38.9 % Fat Mass (lbs): 62 lbs Muscle Mass (lbs): 92.6 lbs Total Body Water (lbs): 64.4 lbs Visceral Fat Rating : 8     HPI  Chief Complaint: OBESITY  Melissa [name] is here to discuss her progress with her obesity treatment plan. She is on the the Category 3 Plan and states she is following her eating plan approximately 75 % of the time. She states she is exercising 30-60 minutes 2 times per week.   Interval History:  Since last office visit she is down 2 lb This gives her a net weight loss of 5 lb in the past 8 weeks of medically supervised weight management    This is a 3% TBW loss  Time has limited her workouts She plans to add in gym workouts/ weight training and has access to a treadmill at the gym She is tolerating Rybelsus  7 mg daily well without adverse SE She occasionally overeat but rarely She denies stress eating Her husband is supportive  Pharmacotherapy: Rybelsus  7 mg daily for T2DM  PHYSICAL EXAM:  Blood pressure 115/83, pulse 74, temperature 98 F (36.7 C), height 5' 1 (1.549 m), weight 159 lb (72.1 kg), SpO2 98%. Body mass index is 30.04 kg/m.  General: She is healthy appearing, cooperative, alert, well developed, and in no acute distress. PSYCH: Has normal mood, affect and thought process.   Lungs: Normal breathing effort, no conversational dyspnea.  ASSESSMENT AND PLAN  TREATMENT PLAN FOR OBESITY:  Recommended Dietary Goals  Melissa [name] is currently in the action stage of change. As such, her goal is to continue weight management plan. She has agreed to the Category 3 Plan.  Behavioral  Intervention  We discussed the following Behavioral Modification Strategies today: increasing lean protein intake to established goals, increasing fiber rich foods, increasing water intake , work on meal planning and preparation, keeping healthy foods at home, identifying sources and decreasing liquid calories, work on managing stress, creating time for self-care and relaxation, avoiding temptations and identifying enticing environmental cues, continue to practice mindfulness when eating, planning for success, and celebration eating strategies.  Additional resources provided today: NA  Recommended Physical Activity Goals  Melissa [name] has been advised to work up to 150 minutes of moderate intensity aerobic activity a week and strengthening exercises 2-3 times per week for cardiovascular health, weight loss maintenance and preservation of muscle mass.   She has agreed to Increase the intensity, frequency or duration of strengthening exercises  and Increase and monitor steps for a goal of 10,000 per day Increase daily walking time, track steps with a smart watch Aim for resistance training 2 x a week  Pharmacotherapy changes for the treatment of obesity: increase Rybelsus  to 14 mg daily  ASSOCIATED CONDITIONS ADDRESSED TODAY  Type 2 diabetes mellitus without complication, without long-term current use of insulin  (HCC) Lab Results  Component Value Date   HGBA1C 6.0 (H) 10/16/2023  Doing well on Rybelsus  7 mg daily w/o hypoglycemia or GI upset. Doing well with weight loss and has room to ramp up workout frequency Continue to limit sugar and starch intake Increase Rybelsus  to 14 mg daily  -     Rybelsus ; Take 1 tablet (14 mg total) by mouth daily.  Dispense: 30 tablet; Refill: 1  Vitamin D  deficiency Last vitamin D  Lab Results  Component Value Date   VD25OH 27.1 (L) 10/16/2023  Repeat level in Feb Continue RX vitamin D  weekly, refilled today  -     Vitamin D  (Ergocalciferol ); Take 1 capsule  (50,000 Units total) by mouth every 7 (seven) days.  Dispense: 12 capsule; Refill: 0  Obesity, Class I, BMI 30-34.9 Improving Has room for improvement with either adhering to cat 3 meal plan or tracking kcal intake with a goal of 1500 per day which should include >70 g of protein intake daily Reviewed plan to ramp up exercise time  BMI 30.0-30.9,adult      She was informed of the importance of frequent follow up visits to maximize her success with intensive lifestyle modifications for her multiple health conditions.   ATTESTASTION STATEMENTS:  Reviewed by clinician on day of visit: allergies, medications, problem list, medical history, surgical history, family history, social history, and previous encounter notes pertinent  to obesity diagnosis.   I have personally spent 30 minutes total time today in preparation, patient care, nutritional counseling and education,  and documentation for this visit, including the following: review of most recent clinical lab tests, prescribing medications/ refilling medications, reviewing medical assistant documentation, review and interpretation of bioimpedence results.     Melissa [name], D.O. DABFM, DABOM [facility] [address] [place], [place] [address] [phone]

## 2023-12-16 ENCOUNTER — Ambulatory Visit: Payer: Self-pay | Admitting: *Deleted

## 2023-12-16 ENCOUNTER — Telehealth: Admitting: Family Medicine

## 2023-12-16 ENCOUNTER — Encounter: Payer: Self-pay | Admitting: Family Medicine

## 2023-12-16 DIAGNOSIS — R112 Nausea with vomiting, unspecified: Secondary | ICD-10-CM

## 2023-12-16 DIAGNOSIS — R197 Diarrhea, unspecified: Secondary | ICD-10-CM

## 2023-12-16 NOTE — Progress Notes (Signed)
 Virtual Visit via Video Note  I connected with Melissa [name] on 12/16/2023 at 2:28 PM by a video enabled telemedicine application and verified that I am speaking with the correct person using two identifiers.  Patient location: home, by self. My location: office - [place].    I discussed the limitations, risks, security and privacy concerns of performing an evaluation and management service by telephone and the availability of in person appointments. I also discussed with the patient that there may be a patient responsible charge related to this service. The patient expressed understanding and agreed to proceed, consent obtained  Chief complaint:  Chief Complaint  Patient presents with   Diarrhea    Sx started Saturday. Thinks she may have food poisoning.    Nausea    And vomiting.     History of Present Illness: Melissa [name] is a 48 y.o. female Acute visit for above, PCP is Dr. Purcell  Nausea, vomiting, diarrhea 2 days ago  about 3pm - ate some popcorn at [employer]'s cheerleading competition. Few hours later, had diarrhea. No nausea at that time. Felt nausea at dinner that night. Vomited after dinner. Did not eat dinner. Recurrent vomiting and diarrhea from 9pm that night until 3 am. No further vomiting yesterday, still with diarrhea yesterday and today. No blood in diarrhea.  No recent hospitalizations, travel, or antibiotics.  No known sick contacts.  Diarrhea 4-6 times per day.  Drinking water and Gatorade. Urinating normally.  No fever.  No abd pain, no jaundice, orscleral icterus.   Of note she has been followed by weight management, [facility] with last visit December 11.  She is on Rybelsus  for type 2 diabetes, previously 7 mg daily, with dosage adjustment 4 days ago up to 14 mg daily. Had not yet started new dose. Held Rybelsus  past 2 days.   Patient Active Problem List   Diagnosis Date Noted   Back pain 11/25/2023   Large  breasts 11/04/2023   Acute otitis externa of both ears 07/04/2023   Excessive gas 04/25/2023   Prediabetes 03/05/2023   Multiple allergies 01/28/2023   Degenerative disc disease, cervical 01/28/2023   Arthritis of carpometacarpal [facility]) joint of both thumbs 10/08/2022   Chronic insomnia 10/04/2022   Early menopause 05/21/2022   Vasomotor symptoms due to menopause 10/16/2021   Essential hypertension 06/28/2021   Chronic pain of both knees 06/28/2021   Weight gain 02/08/2021   Family history of thyroid  disease 02/08/2021   Gastroesophageal reflux disease 11/29/2020   Nonallopathic lesion of lumbosacral region 03/01/2019   Nonallopathic lesion of cervical region 03/01/2019   Nonallopathic lesion of sacral region 03/01/2019   Nonallopathic lesion of thoracic region 03/01/2019   Nonallopathic lesion of rib cage 03/01/2019   Vitamin D  deficiency 05/08/2018   Mixed stress and urge urinary incontinence 02/07/2016   Carpal tunnel syndrome 01/03/2016   Polyarthralgia 01/03/2016   Bipolar depression (HCC) 05/17/2014   Chronic night sweats 06/26/2013   Obesity without serious comorbidity 01/25/2012   Past Medical History:  Diagnosis Date   ADHD    Allergy     Anxiety    Arthritis    Carpal tunnel syndrome    Depression    GERD (gastroesophageal reflux disease)    Hammer toe 04/2013   bilateral 4th and 5th    Hyperlipidemia    Hypertension    Ingrown left big toenail 04/2013   Mental disorder    Neuromuscular disorder (HCC)    Post  term pregnancy over 40 weeks 09/21/2015   Pre-diabetes    Prediabetes    Rheumatoid arthritis (HCC)    SVD (spontaneous vaginal delivery) 09/22/2015   UTI (lower urinary tract infection)    Vitamin D  deficiency    Past Surgical History:  Procedure Laterality Date   BREAST LIFT Bilateral    CARPAL TUNNEL RELEASE     CORRECTION HAMMER TOE Bilateral    CRYOABLATION  2004   CYSTOSCOPY WITH HYDRODISTENSION AND BIOPSY  07/28/2004   with urethral  dilatation   DILATION AND CURETTAGE OF UTERUS     DILATION AND EVACUATION  12/21/2011   Procedure: DILATATION AND EVACUATION;  Surgeon: Curlee VEAR Guan, MD;  Location: [facility] [place];  Service: Gynecology;  Laterality: N/A;  first trimester    ESOPHAGEAL MANOMETRY N/A 02/10/2021   Procedure: ESOPHAGEAL MANOMETRY (EM);  Surgeon: Shila Gustav GAILS, MD;  Location: [facility];  Service: Endoscopy;  Laterality: N/A;   HAMMER TOE SURGERY Bilateral 04/20/2013   Procedure: BILATERAL HAMMER TOE REPAIR AND EXCISION NAIL PERMANENT LEFT FIRST;  Surgeon: Charlie Failing, DPM;  Location: [facility];  Service: Podiatry;  Laterality: Bilateral;  Hammer toe repair fourth and fifth toes bilateral feet with screw placement fourth toes and partial nail excision left great toe   HYSTEROSALPINGOGRAM     MASS EXCISION Left 03/27/2022   Procedure: EXCISION SOFT TISSUE MASS x2, LEFT UPPER ARM;  Surgeon: Eletha Boas, MD;  Location: [facility];  Service: General;  Laterality: Left;  LMA   REDUCTION MAMMAPLASTY Bilateral 2020   TOE SURGERY     ingrown toenail   WISDOM TOOTH EXTRACTION     Allergies[1] Prior to Admission medications  Medication Sig Start Date End Date Taking? Authorizing Provider  ACCU-CHEK GUIDE TEST test strip USE AS DIRECTED ONCE DAILY AS NEEDED 08/28/23  Yes Sagardia, Emil Schanz, MD  Blood Glucose Monitoring Suppl DEVI 1 each by Does not apply route daily as needed. May substitute to any manufacturer covered by patient's insurance. 03/05/23  Yes Sagardia, Miguel Jose, MD  buPROPion (WELLBUTRIN XL) 150 MG 24 hr tablet Take 450 mg by mouth daily. 02/27/21  Yes [provider]  celecoxib  (CELEBREX ) 100 MG capsule TAKE 1 CAPSULE BY MOUTH TWICE A DAY 11/11/23  Yes Smith, Zachary M, DO  DULoxetine (CYMBALTA) 60 MG capsule Take 60 mg by mouth daily. 09/14/21  Yes [provider]  Lancets Misc. MISC 1 each by Does not apply route daily as needed.  May substitute to any manufacturer covered by patient's insurance. 03/05/23  Yes Sagardia, Miguel Jose, MD  levonorgestrel (MIRENA) 20 MCG/DAY IUD 1 each by Intrauterine route once.   Yes [provider]  lisdexamfetamine (VYVANSE) 40 MG capsule Take 40 mg by mouth every morning. 07/30/23  Yes [provider]  RYALTRIS  334-74 MCG/ACT SUSP Place 2 sprays into the nose in the morning and at bedtime. 12/21/21  Yes AmbsArlean HERO, FNP  Salicylic Acid  3 % SHAM Use daily as directed for dry scalp 09/23/17  Yes Jason Leita Repine, FNP  selenium  sulfide (SELSUN ) 2.5 % shampoo USE LOTION EXTERNALLY  TO AFFECTED AREA ONCE DAILY AS NEEDED IRRITATION 02/15/20  Yes Jason Leita Repine, FNP  Semaglutide  (RYBELSUS ) 14 MG TABS Take 1 tablet (14 mg total) by mouth daily. 12/12/23  Yes Bowen, Darice BRAVO, DO  tranexamic acid (LYSTEDA) 650 MG TABS tablet Take 650 mg by mouth daily. 01/04/23  Yes [provider]  Vitamin D , Ergocalciferol , (DRISDOL ) 1.25  MG (50000 UNIT) CAPS capsule Take 1 capsule (50,000 Units total) by mouth every 7 (seven) days. 12/12/23  Yes Bowen, Darice BRAVO, DO  acetic acid -hydrocortisone  (VOSOL -HC) OTIC solution Place 3 drops into both ears 3 (three) times daily. Patient not taking: Reported on 12/16/2023 07/04/23   Sagardia, Miguel Jose, MD  atomoxetine (STRATTERA) 40 MG capsule Take 40 mg by mouth daily. Patient not taking: Reported on 12/16/2023 08/06/23   [provider]  fexofenadine  (ALLEGRA  ALLERGY ) 180 MG tablet Take 1 tablet (180 mg total) by mouth daily. At bedtime for drainage Patient not taking: Reported on 12/16/2023 01/18/23   Dubuc-Southworth, Sylvia, PA-C  hydrOXYzine  (ATARAX ) 25 MG tablet TAKE 1 TABLET BY MOUTH AT BEDTIME AS NEEDED. Patient not taking: Reported on 12/16/2023 12/19/22   Sagardia, Miguel Jose, MD  lisinopril  (ZESTRIL ) 20 MG tablet TAKE 1 TABLET BY MOUTH EVERY DAY Patient not taking: Reported on 12/16/2023 02/04/23   Sagardia, Miguel Jose,  MD  Na Sulfate-K Sulfate-Mg Sulfate concentrate (SUPREP) 17.5-3.13-1.6 GM/177ML SOLN SMARTSIG:1 Kit(s) By Mouth Once 07/30/23   [provider]  Olopatadine  HCl (PATADAY ) 0.2 % SOLN Place 1 drop into both eyes daily as needed. Patient not taking: Reported on 12/16/2023 08/31/21   Jeneal Danita Macintosh, MD  triamcinolone  cream (KENALOG ) 0.1 % Apply 1 Application topically as directed. Patient not taking: Reported on 12/16/2023 10/30/22   [provider]  VEOZAH  45 MG TABS TAKE 1 TABLET BY MOUTH EVERY DAY Patient not taking: Reported on 12/16/2023 03/01/23   Purcell Emil Schanz, MD  XIIDRA 5 % SOLN INSTILL 1 DROP TWICE DAILY INTO [facility] EYE Patient not taking: Reported on 12/16/2023 02/02/20   [provider]   Social History   Socioeconomic History   Marital status: Single    Spouse name: Not on file   Number of children: 1   Years of education: Not on file   Highest education level: Not on file  Occupational History   Occupation: Homemaker   Occupation: disabled  Tobacco Use   Smoking status: Never    Passive exposure: Never   Smokeless tobacco: Never  Vaping Use   Vaping status: Never Used  Substance and Sexual Activity   Alcohol use: Not Currently    Comment: occasionally   Drug use: Never   Sexual activity: Not Currently    Birth control/protection: I.U.D.  Other Topics Concern   Not on file  Social History Narrative   Lives with her daughter/2025   Social Drivers of Health   Tobacco Use: Low Risk (12/16/2023)   Patient History    Smoking Tobacco Use: Never    Smokeless Tobacco Use: Never    Passive Exposure: Never  Financial Resource Strain: Medium Risk (06/17/2023)   Overall Financial Resource Strain (CARDIA)    Difficulty of Paying Living Expenses: Somewhat hard  Food Insecurity: Food Insecurity Present (06/17/2023)   Epic    Worried About Programme Researcher, Broadcasting/film/video in the Last Year: Sometimes true    Ran Out of Food in the Last Year:  Sometimes true  Transportation Needs: No Transportation Needs (06/17/2023)   Epic    Lack of Transportation (Medical): No    Lack of Transportation (Non-Medical): No  Physical Activity: Insufficiently Active (06/17/2023)   Exercise Vital Sign    Days of Exercise per Week: 3 days    Minutes of Exercise per Session: 30 min  Stress: Stress Concern Present (06/17/2023)   [employer] of Occupational Health - Occupational Stress Questionnaire    Feeling  of Stress: Rather much  Social Connections: Moderately Integrated (06/17/2023)   Social Connection and Isolation Panel    Frequency of Communication with Friends and Family: More than three times a week    Frequency of Social Gatherings with Friends and Family: Once a week    Attends Religious Services: More than 4 times per year    Active Member of [employer] or Organizations: Yes    Attends Banker Meetings: 1 to 4 times per year    Marital Status: Never married  Intimate Partner Violence: Patient Unable To Answer (06/17/2023)   Epic    Fear of Current or Ex-Partner: Patient unable to answer    Emotionally Abused: Patient unable to answer    Physically Abused: Patient unable to answer    Sexually Abused: Patient unable to answer  Depression (PHQ2-9): High Risk (10/16/2023)   Depression (PHQ2-9)    PHQ-2 Score: 11  Alcohol Screen: Low Risk (06/17/2023)   Alcohol Screen    Last Alcohol Screening Score (AUDIT): 0  Housing: Unknown (06/17/2023)   Epic    Unable to Pay for Housing in the Last Year: No    Number of Times Moved in the Last Year: Not on file    Homeless in the Last Year: No  Utilities: Not At Risk (06/17/2023)   Epic    Threatened with loss of utilities: No  Health Literacy: Inadequate Health Literacy (06/17/2023)   B1300 Health Literacy    Frequency of need for help with medical instructions: Sometimes    Observations/Objective: There were no vitals filed for this visit. Nontoxic appearance on video.  Speaking  in full sentences without respiratory distress.  No apparent jaundice or scleral icterus.  Appropriate responses.  Coherent.  All questions answered with understanding of plan expressed.  Assessment and Plan: Nausea and vomiting, unspecified vomiting type  Diarrhea, unspecified type Initial vomiting 2 nights ago followed by diarrhea which has persisted.  Question food poisoning versus viral gastroenteritis versus side effects from GLP-1, Rybelsus .  She had not increased the dose of Rybelsus  prior to the symptoms and had tolerated 7 mg dose previously, so less likely.  Either way I do think it is reasonable to hold that medication for a few days to allow the symptoms to improve, then slowly restart to see if tolerated, initially at 7 mg dose before escalating to 14 mg.  Denies recent hospitalization, recent antibiotics or travel.  No known risk factors for C. difficile or infectious diarrhea.  Will hold on stool testing for now with close follow-up precautions.  Symptomatic care discussed for now with fluids, bland foods as she improves, and RTC/ER precautions given.  Follow Up Instructions:  As needed, as above. I discussed the assessment and treatment plan with the patient. The patient was provided an opportunity to ask questions and all were answered. The patient agreed with the plan and demonstrated an understanding of the instructions.   The patient was advised to call back or seek an in-person evaluation if the symptoms worsen or if the condition fails to improve as anticipated.   Reyes JONELLE Pines, MD     [1]  Allergies Allergen Reactions   Bactrim  [Sulfamethoxazole -Trimethoprim ] Itching

## 2023-12-16 NOTE — Telephone Encounter (Signed)
 FYI Only or Action Required?: FYI only for provider: appointment scheduled on 12/15.  Patient was last seen in primary care on 12/12/2023 by Waylan Darice BRAVO, DO.  Called Nurse Triage reporting Diarrhea (Vomiting ).  Symptoms began several days ago.  Interventions attempted: Rest, hydration, or home remedies.  Symptoms are: unchanged.  Triage Disposition: See Physician Within 24 Hours  Patient/caregiver understands and will follow disposition?: Yes  Patient believes she has food poisoning from eating at a child's competition this weekend. Denies abdominal pain. Patient is requesting VV due to uncontrollable bowel.   Copied from CRM #[id number]. Topic: Clinical - Red Word Triage >> Dec 16, 2023  9:47 AM Gustabo D wrote: Pt has been throwing up since Saturday night and having diarrhea and wooziness. Pt just threw up again while holding. She thinks it's food poisoning. Can't drive can't hold it. Reason for Disposition  [1] SEVERE diarrhea (e.g., 7 or more times / day more than normal) AND [2] present > 24 hours (1 day)  Answer Assessment - Initial Assessment Questions 1. DIARRHEA SEVERITY: How bad is the diarrhea? How many more stools have you had in the past 24 hours than normal?      Multiple- constant in the toilet 2. ONSET: When did the diarrhea begin?      Saturday pm 3. STOOL DESCRIPTION:  How loose or watery is the diarrhea? What is the stool color? Is there any blood or mucous in the stool?     Watery, brownish/yellow 4. VOMITING: Are you also vomiting? If Yes, ask: How many times in the past 24 hours?      This morning- watery- patient does not have anything in stomach 5. ABDOMEN PAIN: Are you having any abdomen pain? If Yes, ask: What does it feel like? (e.g., crampy, dull, intermittent, constant)      No abdominal pain 6. ABDOMEN PAIN SEVERITY: If present, ask: How bad is the pain?  (e.g., Scale 1-10; mild, moderate, or severe)     no 7. ORAL INTAKE: If  vomiting, Have you been able to drink liquids? How much liquids have you had in the past 24 hours?     Yes- water, Gatorade, 8-9 bottles  8. HYDRATION: Any signs of dehydration? (e.g., dry mouth [not just dry lips], too weak to stand, dizziness, new weight loss) When did you last urinate?     no 9. EXPOSURE: Have you traveled to a foreign country recently? Have you been exposed to anyone with diarrhea? Could you have eaten any food that was spoiled?     Patient did get food outside the home 10. ANTIBIOTIC USE: Are you taking antibiotics now or have you taken antibiotics in the past 2 months?       no 11. OTHER SYMPTOMS: Do you have any other symptoms? (e.g., fever, blood in stool)       headache  Protocols used: Diarrhea-A-AH

## 2023-12-16 NOTE — Patient Instructions (Addendum)
 As we discussed, you certainly could have a type of food poisoning or have a stomach virus that has caused the nausea, vomiting and diarrhea, now just diarrhea.  However as we discussed the Rybelsus  also can cause those symptoms.  As you have been on this dose for a while without side effects or symptoms it would be less likely but I do agree with holding that medication for now until your symptoms have resolved then slowly return to the 7 mg dose if tolerated before going to the 14 mg dose.  Continue to drink plenty of fluids, Powerade or Gatorade if needed, soup broth is fine or Jell-O as well and as appetite increases make sure to keep a bland diet like crackers or other bland foods.  Fluids are most important for now.  If diarrhea is not improving into the next 24 to 48 hours or any worsening symptoms prior to that time, please be seen.  Take care!  Diarrhea, Adult Diarrhea is frequent loose and sometimes watery bowel movements. Diarrhea can make you feel weak and cause you to become dehydrated. Dehydration is a condition in which there is not enough water or other fluids in the body. Dehydration can make you tired and thirsty, cause you to have a dry mouth, and decrease how often you urinate. Diarrhea typically lasts 2-3 days. However, it can last longer if it is a sign of something more serious. It is important to treat your diarrhea as told by your health care provider. Follow these instructions at home: Eating and drinking     Follow these recommendations as told by your health care provider: Take an oral rehydration solution (ORS). This is an over-the-counter medicine that helps return your body to its normal balance of nutrients and water. It is found at pharmacies and retail stores. Drink enough fluid to keep your urine pale yellow. Drink fluids such as water, diluted fruit juice, and low-calorie sports drinks. You can drink milk also, if desired. Sucking on ice chips is another way to get  fluids. Avoid drinking fluids that contain a lot of sugar or caffeine, such as soda, energy drinks, and regular sports drinks. Avoid alcohol. Eat bland, easy-to-digest foods in small amounts as you are able. These foods include bananas, applesauce, rice, lean meats, toast, and crackers. Avoid spicy or fatty foods.  Medicines Take over-the-counter and prescription medicines only as told by your health care provider. If you were prescribed antibiotics, take them as told by your health care provider. Do not stop using the antibiotic even if you start to feel better. General instructions  Wash your hands often using soap and water for at least 20 seconds. If soap and water are not available, use hand sanitizer. Others in the household should wash their hands as well. Hands should be washed: After using the toilet or changing a diaper. Before preparing, cooking, or serving food. While caring for a sick person or while visiting someone in a hospital. Rest at home while you recover. Take a warm bath to relieve any burning or pain from frequent diarrhea episodes. Watch your condition for any changes. Contact a health care provider if: You have a fever. Your diarrhea gets worse. You have new symptoms. You vomit every time you eat or drink. You feel light-headed, dizzy, or have a headache. You have muscle cramps. You have signs of dehydration, such as: Dark urine, very little urine, or no urine. Cracked lips. Dry mouth. Sunken eyes. Sleepiness. Weakness. You have bloody or  black stools or stools that look like tar. You have severe pain, cramping, or bloating in your abdomen. Your skin feels cold and clammy. You feel confused. Get help right away if: You have chest pain or your heart is beating very quickly. You have trouble breathing or you are breathing very quickly. You feel extremely weak or you faint. These symptoms may be an emergency. Get help right away. Call 911. Do not wait to  see if the symptoms will go away. Do not drive yourself to the hospital. This information is not intended to replace advice given to you by your health care provider. Make sure you discuss any questions you have with your health care provider. Document Revised: 06/06/2021 Document Reviewed: 06/06/2021 Elsevier Patient Education  2024 [employer].

## 2023-12-18 ENCOUNTER — Ambulatory Visit: Admission: EM | Admit: 2023-12-18 | Discharge: 2023-12-18 | Disposition: A | Discharge status: Home / Self Care

## 2023-12-18 ENCOUNTER — Ambulatory Visit: Payer: Self-pay

## 2023-12-18 ENCOUNTER — Encounter: Payer: Self-pay | Admitting: Emergency Medicine

## 2023-12-18 DIAGNOSIS — N3001 Acute cystitis with hematuria: Secondary | ICD-10-CM | POA: Diagnosis present

## 2023-12-18 DIAGNOSIS — R112 Nausea with vomiting, unspecified: Secondary | ICD-10-CM | POA: Insufficient documentation

## 2023-12-18 LAB — POCT URINE DIPSTICK
Glucose, UA: NEGATIVE mg/dL
Leukocytes, UA: NEGATIVE
Nitrite, UA: NEGATIVE
POC PROTEIN,UA: 100 — AB
Spec Grav, UA: >=1.03 — AB (ref 1.010–1.025)
Urobilinogen, UA: 4 U/dL — AB
pH, UA: 5.5 (ref 5.0–8.0)

## 2023-12-18 MED ORDER — ONDANSETRON 8 MG PO TBDP: 8.0000 mg | ORAL_TABLET | Freq: Three times a day (TID) | ORAL | 0 refills | Status: AC | PRN

## 2023-12-18 MED ORDER — CEPHALEXIN 500 MG PO CAPS: 500.0000 mg | ORAL_CAPSULE | Freq: Four times a day (QID) | ORAL | 0 refills | Status: AC

## 2023-12-18 NOTE — Discharge Instructions (Addendum)
 You have been diagnosed with a gastrointestinal issue today with symptoms of nausea, vomiting, and/or diarrhea.  It is best that you eat a bland diet which includes dry toast, applesauce, bananas, chicken broth, plain rice, or plain mashed potatoes eat these things with no salt-and-pepper, excessive butter, or other seasonings.  It is also important to drink plenty of fluids as you are losing a lot of electrolytes due to vomiting and having diarrhea.  Diluted Gatorade, water , Pedialyte, liquid IV, etc. are good choices for fluid intake.  You may also use popsicles and/or [place] ice.  It is recommended that you do not use anything to stop your diarrhea as it is your body's way of getting rid of the offending bug.  If your symptoms are not improving within 3 to 5 days, you are experiencing significant fatigue, or you are urinating less frequently you will need to follow-up with your PCP or report to the ER.

## 2023-12-18 NOTE — Telephone Encounter (Signed)
 Attempted to call pt x2. VM left for pt. Will attempt to contact pt at a later time.   Message from Taleah C sent at 12/18/2023  9:50 AM EST  Reason for Triage: pt called in and stated that she is still not feeling well. She is still having sxs of Diarrhea and vomitting. She had a virtual appt on Monday 12/15. Please call and advise.

## 2023-12-18 NOTE — Telephone Encounter (Signed)
 Attempted to call pt x1. Will attempt to contact pt at a later time.   Message from Taleah C sent at 12/18/2023  9:50 AM EST  Reason for Triage: pt called in and stated that she is still not feeling well. She is still having sxs of Diarrhea and vomitting. She had a virtual appt on Monday 12/15. Please call and advise.

## 2023-12-18 NOTE — ED Triage Notes (Signed)
 Pt presents c/o diarrhea and emesis x 4 days. Pt states,  It started Saturday. I thought it was food poisoning. I have had diarrhea and vomiting since Saturday night. Everything I eat makes me throw up, even if it's bland. I have had a headache too.  Pt denies any additional sxs.

## 2023-12-18 NOTE — Telephone Encounter (Signed)
 Attempted to call pt x 3. VM left for pt. Will route to clinic for follow up.   Message from Taleah C sent at 12/18/2023  9:50 AM EST  Reason for Triage: pt called in and stated that she is still not feeling well. She is still having sxs of Diarrhea and vomitting. She had a virtual appt on Monday 12/15. Please call and advise.

## 2023-12-18 NOTE — ED Provider Notes (Signed)
 [facility]    CSN: [id number] Arrival date & time: 12/18/23  0931      History   Chief Complaint Chief Complaint  Patient presents with   Diarrhea   Emesis    HPI Melissa [name] is a 48 y.o. female.   Patient present today due today due to 4 days worth of nausea, vomiting, and diarrhea.  Patient denies known sick contacts or suspect food or water intake.  Patient states that she has not been able to tolerate solids or liquids without vomiting.  Patient states that she had a virtual encounter with her PCP and they told her to ride it out.  Patient denies fever, chills, dysuria, or back pain.  Patient states that she has been experiencing 3-4 episodes of vomiting daily and diarrhea every 30 minutes..  Patient denies use of meds or change in diet.  The history is provided by the patient.  Diarrhea Associated symptoms: vomiting   Emesis Associated symptoms: diarrhea     Past Medical History:  Diagnosis Date   ADHD    Allergy     Anxiety    Arthritis    Carpal tunnel syndrome    Depression    GERD (gastroesophageal reflux disease)    Hammer toe 04/2013   bilateral 4th and 5th    Hyperlipidemia    Hypertension    Ingrown left big toenail 04/2013   Mental disorder    Neuromuscular disorder (HCC)    Post term pregnancy over 40 weeks 09/21/2015   Pre-diabetes    Prediabetes    Rheumatoid arthritis (HCC)    SVD (spontaneous vaginal delivery) 09/22/2015   UTI (lower urinary tract infection)    Vitamin D  deficiency     Patient Active Problem List   Diagnosis Date Noted   Back pain 11/25/2023   Large breasts 11/04/2023   Acute otitis externa of both ears 07/04/2023   Excessive gas 04/25/2023   Prediabetes 03/05/2023   Multiple allergies 01/28/2023   Degenerative disc disease, cervical 01/28/2023   Arthritis of carpometacarpal (CMC) joint of both thumbs 10/08/2022   Chronic insomnia 10/04/2022   Early menopause 05/21/2022   Vasomotor symptoms due  to menopause 10/16/2021   Essential hypertension 06/28/2021   Chronic pain of both knees 06/28/2021   Weight gain 02/08/2021   Family history of thyroid  disease 02/08/2021   Gastroesophageal reflux disease 11/29/2020   Nonallopathic lesion of lumbosacral region 03/01/2019   Nonallopathic lesion of cervical region 03/01/2019   Nonallopathic lesion of sacral region 03/01/2019   Nonallopathic lesion of thoracic region 03/01/2019   Nonallopathic lesion of rib cage 03/01/2019   Vitamin D  deficiency 05/08/2018   Mixed stress and urge urinary incontinence 02/07/2016   Carpal tunnel syndrome 01/03/2016   Polyarthralgia 01/03/2016   Bipolar depression (HCC) 05/17/2014   Chronic night sweats 06/26/2013   Obesity without serious comorbidity 01/25/2012    Past Surgical History:  Procedure Laterality Date   BREAST LIFT Bilateral    CARPAL TUNNEL RELEASE     CORRECTION HAMMER TOE Bilateral    CRYOABLATION  2004   CYSTOSCOPY WITH HYDRODISTENSION AND BIOPSY  07/28/2004   with urethral dilatation   DILATION AND CURETTAGE OF UTERUS     DILATION AND EVACUATION  12/21/2011   Procedure: DILATATION AND EVACUATION;  Surgeon: Curlee VEAR Guan, MD;  Location: [facility];  Service: Gynecology;  Laterality: N/A;  first trimester    ESOPHAGEAL MANOMETRY N/A 02/10/2021   Procedure: ESOPHAGEAL MANOMETRY (EM);  Surgeon:  Nandigam, Kavitha V, MD;  Location: [facility];  Service: Endoscopy;  Laterality: N/A;   HAMMER TOE SURGERY Bilateral 04/20/2013   Procedure: BILATERAL HAMMER TOE REPAIR AND EXCISION NAIL PERMANENT LEFT FIRST;  Surgeon: Charlie Failing, DPM;  Location: [facility];  Service: Podiatry;  Laterality: Bilateral;  Hammer toe repair fourth and fifth toes bilateral feet with screw placement fourth toes and partial nail excision left great toe   HYSTEROSALPINGOGRAM     MASS EXCISION Left 03/27/2022   Procedure: EXCISION SOFT TISSUE MASS x2, LEFT UPPER ARM;  Surgeon: Eletha Boas, MD;  Location: [facility];  Service: General;  Laterality: Left;  LMA   REDUCTION MAMMAPLASTY Bilateral 2020   TOE SURGERY     ingrown toenail   WISDOM TOOTH EXTRACTION      OB History     Gravida  6   Para  1   Term  1   Preterm  0   AB  5   Living  1      SAB  2   IAB  1   Ectopic  2   Multiple  0   Live Births  1            Home Medications    Prior to Admission medications  Medication Sig Start Date End Date Taking? Authorizing Provider  cephALEXin  (KEFLEX ) 500 MG capsule Take 1 capsule (500 mg total) by mouth 4 (four) times daily. 12/18/23  Yes Andra Krabbe C, PA-C  ondansetron  (ZOFRAN -ODT) 8 MG disintegrating tablet Take 1 tablet (8 mg total) by mouth every 8 (eight) hours as needed. 12/18/23  Yes Andra Krabbe C, PA-C  ACCU-CHEK GUIDE TEST test strip USE AS DIRECTED ONCE DAILY AS NEEDED 08/28/23   Sagardia, Miguel Jose, MD  acetic acid -hydrocortisone  (VOSOL -HC) OTIC solution Place 3 drops into both ears 3 (three) times daily. Patient not taking: Reported on 12/16/2023 07/04/23   Sagardia, Miguel Jose, MD  atomoxetine (STRATTERA) 40 MG capsule Take 40 mg by mouth daily. Patient not taking: Reported on 12/16/2023 08/06/23   [provider]  Blood Glucose Monitoring Suppl DEVI 1 each by Does not apply route daily as needed. May substitute to any manufacturer covered by patient's insurance. 03/05/23   Sagardia, Miguel Jose, MD  buPROPion (WELLBUTRIN XL) 150 MG 24 hr tablet Take 450 mg by mouth daily. 02/27/21   [provider]  celecoxib  (CELEBREX ) 100 MG capsule TAKE 1 CAPSULE BY MOUTH TWICE A DAY 11/11/23   Smith, Zachary M, DO  DULoxetine (CYMBALTA) 60 MG capsule Take 60 mg by mouth daily. 09/14/21   [provider]  fexofenadine  (ALLEGRA  ALLERGY ) 180 MG tablet Take 1 tablet (180 mg total) by mouth daily. At bedtime for drainage Patient not taking: Reported on 12/16/2023 01/18/23   Pressley-Southworth,  Sylvia, PA-C  hydrOXYzine  (ATARAX ) 25 MG tablet TAKE 1 TABLET BY MOUTH AT BEDTIME AS NEEDED. Patient not taking: Reported on 12/16/2023 12/19/22   Purcell Emil Schanz, MD  Lancets Misc. MISC 1 each by Does not apply route daily as needed. May substitute to any manufacturer covered by patient's insurance. 03/05/23   Sagardia, Miguel Jose, MD  levonorgestrel (MIRENA) 20 MCG/DAY IUD 1 each by Intrauterine route once.    [provider]  lisdexamfetamine (VYVANSE) 40 MG capsule Take 40 mg by mouth every morning. 07/30/23   [provider]  lisinopril  (ZESTRIL ) 20 MG tablet TAKE 1 TABLET BY MOUTH EVERY DAY Patient not taking: Reported on 12/16/2023 02/04/23  Sagardia, Miguel Jose, MD  Na Sulfate-K Sulfate-Mg Sulfate concentrate (SUPREP) 17.5-3.13-1.6 GM/177ML SOLN SMARTSIG:1 Kit(s) By Mouth Once 07/30/23   [provider]  Olopatadine  HCl (PATADAY ) 0.2 % SOLN Place 1 drop into both eyes daily as needed. Patient not taking: Reported on 12/16/2023 08/31/21   Jeneal Danita Macintosh, MD  RYALTRIS  [phone] MCG/ACT SUSP Place 2 sprays into the nose in the morning and at bedtime. 12/21/21   Cari Arlean HERO, FNP  Salicylic Acid  3 % SHAM Use daily as directed for dry scalp 09/23/17   Jason Leita Repine, FNP  selenium  sulfide (SELSUN ) 2.5 % shampoo USE LOTION EXTERNALLY  TO AFFECTED AREA ONCE DAILY AS NEEDED IRRITATION 02/15/20   Jason Leita Repine, FNP  Semaglutide  (RYBELSUS ) 14 MG TABS Take 1 tablet (14 mg total) by mouth daily. 12/12/23   Bowen, Darice BRAVO, DO  tranexamic acid (LYSTEDA) 650 MG TABS tablet Take 650 mg by mouth daily. 01/04/23   [provider]  triamcinolone  cream (KENALOG ) 0.1 % Apply 1 Application topically as directed. Patient not taking: Reported on 12/16/2023 10/30/22   [provider]  VEOZAH  45 MG TABS TAKE 1 TABLET BY MOUTH EVERY DAY Patient not taking: Reported on 12/16/2023 03/01/23   Sagardia, Miguel Jose, MD  Vitamin D , Ergocalciferol ,  (DRISDOL ) 1.25 MG (50000 UNIT) CAPS capsule Take 1 capsule (50,000 Units total) by mouth every 7 (seven) days. 12/12/23   Bowen, Darice BRAVO, DO  XIIDRA 5 % SOLN INSTILL 1 DROP TWICE DAILY INTO [facility] EYE Patient not taking: Reported on 12/16/2023 02/02/20   [provider]    Family History Family History  Problem Relation Age of Onset   Hyperlipidemia Mother    Asthma Mother    Seizures Mother    Arthritis Mother    Anxiety disorder Mother    Sleep apnea Mother    Cancer Father    Lung cancer Father    Thyroid  disease Maternal Grandmother    Heart disease Paternal Grandmother        2 CABG   Lung cancer Paternal Grandfather    Colon polyps Paternal Aunt    Colon cancer Paternal Aunt    Esophageal cancer Neg Hx    Rectal cancer Neg Hx    Stomach cancer Neg Hx     Social History Social History[1]   Allergies   Bactrim  [sulfamethoxazole -trimethoprim ]   Review of Systems Review of Systems  Gastrointestinal:  Positive for diarrhea and vomiting.     Physical Exam Triage Vital Signs ED Triage Vitals  Encounter Vitals Group     BP 12/18/23 1050 131/81     Girls Systolic BP Percentile --      Girls Diastolic BP Percentile --      Boys Systolic BP Percentile --      Boys Diastolic BP Percentile --      Pulse Rate 12/18/23 1050 83     Resp 12/18/23 1050 18     Temp 12/18/23 1050 98.4 F (36.9 C)     Temp Source 12/18/23 1050 Oral     SpO2 12/18/23 1050 98 %     Weight 12/18/23 1049 158 lb 15.2 oz (72.1 kg)     Height --      Head Circumference --      Peak Flow --      Pain Score 12/18/23 1048 0     Pain Loc --      Pain Education --      Exclude from Growth Chart --  No data found.  Updated Vital Signs BP 131/81 (BP Location: Left Arm)   Pulse 83   Temp 98.4 F (36.9 C) (Oral)   Resp 18   Wt 158 lb 15.2 oz (72.1 kg)   LMP  (LMP Unknown)   SpO2 98%   BMI 30.03 kg/m   Visual Acuity Right Eye Distance:   Left Eye Distance:   Bilateral  Distance:    Right Eye Near:   Left Eye Near:    Bilateral Near:     Physical Exam Vitals and nursing note reviewed.  Constitutional:      General: She is not in acute distress.    Appearance: Normal appearance. She is not ill-appearing, toxic-appearing or diaphoretic.  Eyes:     General: No scleral icterus. Cardiovascular:     Rate and Rhythm: Normal rate and regular rhythm.     Heart sounds: Normal heart sounds.  Pulmonary:     Effort: Pulmonary effort is normal. No respiratory distress.     Breath sounds: Normal breath sounds. No wheezing or rhonchi.  Abdominal:     General: Abdomen is flat. Bowel sounds are normal.     Palpations: Abdomen is soft.     Tenderness: There is abdominal tenderness in the right upper quadrant, right lower quadrant, suprapubic area and left lower quadrant. There is no right CVA tenderness or left CVA tenderness.  Skin:    General: Skin is warm.  Neurological:     Mental Status: She is alert and oriented to person, place, and time.  Psychiatric:        Mood and Affect: Mood normal.        Behavior: Behavior normal.      UC Treatments / Results  Labs (all labs ordered are listed, but only abnormal results are displayed) Labs Reviewed  POCT URINE DIPSTICK - Abnormal; Notable for the following components:      Result Value   Clarity, UA cloudy (*)    Bilirubin, UA moderate (*)    Ketones, POC UA trace (5) (*)    Spec Grav, UA >=1.030 (*)    Blood, UA moderate (*)    POC PROTEIN,UA =100 (*)    Urobilinogen, UA 4.0 (*)    All other components within normal limits  URINE CULTURE    EKG   Radiology No results found.  Procedures Procedures (including critical care time)  Medications Ordered in UC Medications - No data to display  Initial Impression / Assessment and Plan / UC Course  I have reviewed the triage vital signs and the nursing notes.  Pertinent labs & imaging results that were available during my care of the patient  were reviewed by me and considered in my medical decision making (see chart for details).    Final Clinical Impressions(s) / UC Diagnoses   Final diagnoses:  Nausea and vomiting, unspecified vomiting type  Acute cystitis with hematuria     Discharge Instructions      You have been diagnosed with a gastrointestinal issue today with symptoms of nausea, vomiting, and/or diarrhea.  It is best that you eat a bland diet which includes dry toast, applesauce, bananas, chicken broth, plain rice, or plain mashed potatoes eat these things with no salt-and-pepper, excessive butter, or other seasonings.  It is also important to drink plenty of fluids as you are losing a lot of electrolytes due to vomiting and having diarrhea.  Diluted Gatorade, water, Pedialyte, liquid IV, etc. are good choices for fluid intake.  You may also use popsicles and/or Italian ice.  It is recommended that you do not use anything to stop your diarrhea as it is your body's way of getting rid of the offending bug.  If your symptoms are not improving within 3 to 5 days, you are experiencing significant fatigue, or you are urinating less frequently you will need to follow-up with your PCP or report to the ER.      ED Prescriptions     Medication Sig Dispense Auth. Provider   cephALEXin  (KEFLEX ) 500 MG capsule Take 1 capsule (500 mg total) by mouth 4 (four) times daily. 20 capsule Andra Krabbe C, PA-C   ondansetron  (ZOFRAN -ODT) 8 MG disintegrating tablet Take 1 tablet (8 mg total) by mouth every 8 (eight) hours as needed. 20 tablet Andra Krabbe BROCKS, PA-C      PDMP not reviewed this encounter.    [1]  Social History Tobacco Use   Smoking status: Never    Passive exposure: Never   Smokeless tobacco: Never  Vaping Use   Vaping status: Never Used  Substance Use Topics   Alcohol use: Not Currently    Comment: occasionally   Drug use: Never     Andra Krabbe BROCKS, PA-C 12/18/23 1130

## 2023-12-19 LAB — URINE CULTURE: Culture: NO GROWTH

## 2023-12-19 NOTE — Telephone Encounter (Signed)
FYI Advise if needed.

## 2023-12-19 NOTE — Telephone Encounter (Signed)
 Called patient and LVM there was no answered did advise patient if she is unable to come in and be seen with us  to visit a urgent care asap

## 2023-12-30 ENCOUNTER — Encounter: Payer: Self-pay | Admitting: Family Medicine

## 2023-12-30 ENCOUNTER — Other Ambulatory Visit: Payer: Self-pay

## 2023-12-30 DIAGNOSIS — M5412 Radiculopathy, cervical region: Secondary | ICD-10-CM

## 2024-01-08 ENCOUNTER — Other Ambulatory Visit: Payer: Self-pay | Admitting: Family Medicine

## 2024-01-08 DIAGNOSIS — E119 Type 2 diabetes mellitus without complications: Secondary | ICD-10-CM

## 2024-01-10 NOTE — Progress Notes (Unsigned)
 " Melissa [name] Melissa [name] [address] [place] [address] Phone: [phone] Subjective:   Melissa [name] am a scribe for Dr. Claudene and Dr. Leonce.   I'm seeing this patient by the request  of:  Sagardia, Emil Schanz, MD  CC: back and neck pain follow up   YEP:Dlagzrupcz  Melissa [name] is a 49 y.o. female coming in with complaint of back and neck pain. OMT 11/04/2023. Patient states that the back and neck are the same. Would like to know if she should get a cocktail today or wait because she has the epidural coming up soon.   Medications patient has been prescribed: Celebrex   Taking: yes       Reviewed prior external information including notes and imaging from previsou exam, outside providers and external EMR if available.   As well as notes that were available from care everywhere and other healthcare systems.  Past medical history, social, surgical and family history all reviewed in electronic medical record.  No pertanent information unless stated regarding to the chief complaint.   Past Medical History:  Diagnosis Date   ADHD    Allergy     Anxiety    Arthritis    Carpal tunnel syndrome    Depression    GERD (gastroesophageal reflux disease)    Hammer toe 04/2013   bilateral 4th and 5th    Hyperlipidemia    Hypertension    Ingrown left big toenail 04/2013   Mental disorder    Neuromuscular disorder (HCC)    Post term pregnancy over 40 weeks 09/21/2015   Pre-diabetes    Prediabetes    Rheumatoid arthritis (HCC)    SVD (spontaneous vaginal delivery) 09/22/2015   UTI (lower urinary tract infection)    Vitamin D  deficiency     Allergies[1]   Review of Systems:  No headache, visual changes, nausea, vomiting, diarrhea, constipation, dizziness, abdominal pain, skin rash, fevers, chills, night sweats, weight loss, swollen lymph nodes, body aches, joint swelling, chest pain, shortness of breath, mood changes. POSITIVE muscle  aches  Objective  Blood pressure 124/70, pulse 86, height 5' 1 (1.549 m), weight 157 lb (71.2 kg), SpO2 98%.   General: No apparent distress alert and oriented x3 mood and affect normal, dressed appropriately.  HEENT: Pupils equal, extraocular movements intact  Respiratory: Patient's speak in full sentences and does not appear short of breath  Cardiovascular: No lower extremity edema, non tender, no erythema  Gait MSK:  Back more tightness noted in the thoracolumbar juncture.  Seems to be more tenderness to palpation in this area.  Negative straight leg test noted.  Osteopathic findings  C2 flexed rotated and side bent right C6 flexed rotated and side bent left T3 extended rotated and side bent right inhaled rib T9 extended rotated and side bent left L2 flexed rotated and side bent right L3 flexed rotated and side bent left Sacrum right on right    Assessment and Plan:  Back pain Chronic problem that is multifactorial.  Patient is currently continue to work on home exercises.  We discussed the importance of potential core strengthening techniques.  Discussed icing regimen and home exercises.  Discussed which activities to do and which ones to avoid.  Increase activity slowly.  Discussed icing regimen.  Follow-up again in 6 to 12 weeks Toradol  injection open feels betterPatient as well.  Undergoing an epidural on the back in the relatively near future.  Follow-up again in 6 to 12 weeks otherwise.  Nonallopathic problems  Decision today to treat with OMT was based on Physical Exam  After verbal consent patient was treated with HVLA, ME, FPR techniques in cervical, rib, thoracic, lumbar, and sacral  areas  Patient tolerated the procedure well with improvement in symptoms  Patient given exercises, stretches and lifestyle modifications  See medications in patient instructions if given  Patient will follow up in 4-8 weeks     The above documentation has been reviewed and is  accurate and complete Melissa Pickney M Nassim Cosma, DO         Note: This dictation was prepared with Dragon dictation along with smaller phrase technology. Any transcriptional errors that result from this process are unintentional.            [1]  Allergies Allergen Reactions   Bactrim  [Sulfamethoxazole -Trimethoprim ] Itching   "

## 2024-01-13 ENCOUNTER — Encounter: Payer: Self-pay | Admitting: Family Medicine

## 2024-01-13 ENCOUNTER — Ambulatory Visit: Admitting: Family Medicine

## 2024-01-13 VITALS — BP 124/70 | HR 86 | Ht 61.0 in | Wt 157.0 lb

## 2024-01-13 DIAGNOSIS — M9902 Segmental and somatic dysfunction of thoracic region: Secondary | ICD-10-CM

## 2024-01-13 DIAGNOSIS — G8929 Other chronic pain: Secondary | ICD-10-CM | POA: Diagnosis not present

## 2024-01-13 DIAGNOSIS — M9904 Segmental and somatic dysfunction of sacral region: Secondary | ICD-10-CM | POA: Diagnosis not present

## 2024-01-13 DIAGNOSIS — M9908 Segmental and somatic dysfunction of rib cage: Secondary | ICD-10-CM | POA: Diagnosis not present

## 2024-01-13 DIAGNOSIS — M9901 Segmental and somatic dysfunction of cervical region: Secondary | ICD-10-CM | POA: Diagnosis not present

## 2024-01-13 DIAGNOSIS — M5441 Lumbago with sciatica, right side: Secondary | ICD-10-CM | POA: Diagnosis not present

## 2024-01-13 DIAGNOSIS — M9903 Segmental and somatic dysfunction of lumbar region: Secondary | ICD-10-CM | POA: Diagnosis not present

## 2024-01-13 MED ORDER — KETOROLAC TROMETHAMINE 60 MG/2ML IM SOLN
60.0000 mg | Freq: Once | INTRAMUSCULAR | Status: AC
  Administered 2024-01-13: 60 mg via INTRAMUSCULAR

## 2024-01-13 NOTE — Assessment & Plan Note (Addendum)
 Chronic problem that is multifactorial.  Patient is currently continue to work on home exercises.  We discussed the importance of potential core strengthening techniques.  Discussed icing regimen and home exercises.  Discussed which activities to do and which ones to avoid.  Increase activity slowly.  Discussed icing regimen.  Follow-up again in 6 to 12 weeks Toradol  injection open feels betterPatient as well.  Undergoing an epidural on the back in the relatively near future.  Follow-up again in 6 to 12 weeks otherwise.

## 2024-01-13 NOTE — Patient Instructions (Addendum)
 Good to see you. Injection in backside today. No Nsaids for 24 hours.  See me again in 6 weeks.

## 2024-01-13 NOTE — Discharge Instructions (Signed)

## 2024-01-14 ENCOUNTER — Inpatient Hospital Stay
Admission: RE | Admit: 2024-01-14 | Discharge: 2024-01-14 | Disposition: A | Source: Ambulatory Visit | Attending: Family Medicine | Admitting: Family Medicine

## 2024-01-14 DIAGNOSIS — M5412 Radiculopathy, cervical region: Secondary | ICD-10-CM

## 2024-01-14 MED ORDER — IOPAMIDOL (ISOVUE-M 300) INJECTION 61%
1.0000 mL | Freq: Once | INTRAMUSCULAR | Status: AC | PRN
  Administered 2024-01-14: 1 mL via EPIDURAL

## 2024-01-14 MED ORDER — TRIAMCINOLONE ACETONIDE 40 MG/ML IJ SUSP (RADIOLOGY)
60.0000 mg | Freq: Once | INTRAMUSCULAR | Status: AC
  Administered 2024-01-14: 60 mg via EPIDURAL

## 2024-01-20 ENCOUNTER — Ambulatory Visit: Admitting: Family Medicine

## 2024-01-25 ENCOUNTER — Encounter: Payer: Self-pay | Admitting: Family Medicine

## 2024-01-28 ENCOUNTER — Other Ambulatory Visit: Payer: Self-pay

## 2024-01-28 DIAGNOSIS — M5030 Other cervical disc degeneration, unspecified cervical region: Secondary | ICD-10-CM

## 2024-01-28 NOTE — Telephone Encounter (Signed)
 Referral placed

## 2024-01-30 ENCOUNTER — Ambulatory Visit (INDEPENDENT_AMBULATORY_CARE_PROVIDER_SITE_OTHER): Admitting: Family Medicine

## 2024-01-30 ENCOUNTER — Ambulatory Visit

## 2024-01-30 ENCOUNTER — Encounter: Payer: Self-pay | Admitting: Family Medicine

## 2024-01-30 VITALS — BP 122/70 | HR 104 | Ht 61.0 in | Wt 155.0 lb

## 2024-01-30 DIAGNOSIS — M5030 Other cervical disc degeneration, unspecified cervical region: Secondary | ICD-10-CM

## 2024-01-30 DIAGNOSIS — M7711 Lateral epicondylitis, right elbow: Secondary | ICD-10-CM

## 2024-01-30 DIAGNOSIS — M5412 Radiculopathy, cervical region: Secondary | ICD-10-CM

## 2024-01-30 MED ORDER — METHYLPREDNISOLONE ACETATE 40 MG/ML IJ SUSP
40.0000 mg | Freq: Once | INTRAMUSCULAR | Status: AC
  Administered 2024-01-30: 40 mg via INTRAMUSCULAR

## 2024-01-30 MED ORDER — KETOROLAC TROMETHAMINE 30 MG/ML IJ SOLN
30.0000 mg | Freq: Once | INTRAMUSCULAR | Status: AC
  Administered 2024-01-30: 30 mg via INTRAMUSCULAR

## 2024-01-30 NOTE — Assessment & Plan Note (Signed)
 Worsening symptoms with radicular symptoms.  Differential includes a lateral epicondylitis but I think it is highly unlikely.  Has had carpal tunnel surgery previously.  At this point I do feel that advanced imaging is warranted with that affecting daily activities as well as waking her up at night.  Patient would like to consider even the possibility of surgical intervention if findings are consistent with any nerve impingement that is contributing.  Follow-up with me again in days after the MRI to discuss if needed.

## 2024-01-30 NOTE — Patient Instructions (Addendum)
 Good to see you. Half cocktail injection today. No NSAIDs for 24 hours. Injection in elbow. X-ray on the way out. MRI of neck.[phone] See me again as scheduled

## 2024-01-30 NOTE — Assessment & Plan Note (Signed)
 Repeat injection given today, tolerated the procedure well, discussed icing regimen and home exercises, increase activity slowly.  Follow-up with me again in 6 to 12 weeks.

## 2024-01-30 NOTE — Progress Notes (Signed)
 " [name] [address] [place] [address] Phone: [phone] Subjective:   Melissa [name] am a scribe for Dr. Claudene.  I'm seeing this patient by the request  of:  Sagardia, Emil Schanz, MD  CC: Pain all over.  YEP:Dlagzrupcz  Melissa [name] is a 49 y.o. female coming in with complaint of back and neck pain, worsening neck pain, has been referred to neurosurgery to discuss other treatment options.  Patient states that her it is her right arm today hurts. Would like to know doctors thoughts about the surgery. Concerned about her daughter. Would like a shot in the elbow directly and the cocktail injections today.  Medications patient has been prescribed:   Taking:      Has been seen by outside facility recently and did have a urinary tract infection it appeared.  Moderate hematuria urine culture negative.   Reviewed prior external information including notes and imaging from previsou exam, outside providers and external EMR if available.   As well as notes that were available from care everywhere and other healthcare systems.  Past medical history, social, surgical and family history all reviewed in electronic medical record.  No pertanent information unless stated regarding to the chief complaint.   Past Medical History:  Diagnosis Date   ADHD    Allergy     Anxiety    Arthritis    Carpal tunnel syndrome    Depression    GERD (gastroesophageal reflux disease)    Hammer toe 04/2013   bilateral 4th and 5th    Hyperlipidemia    Hypertension    Ingrown left big toenail 04/2013   Mental disorder    Neuromuscular disorder (HCC)    Post term pregnancy over 40 weeks 09/21/2015   Pre-diabetes    Prediabetes    Rheumatoid arthritis (HCC)    SVD (spontaneous vaginal delivery) 09/22/2015   UTI (lower urinary tract infection)    Vitamin D  deficiency     Allergies[1]   Review of Systems:  No headache, visual changes, nausea,  vomiting, diarrhea, constipation, dizziness, abdominal pain, skin rash, fevers, chills, night sweats, weight loss, swollen lymph nodes, body aches, joint swelling, chest pain, shortness of breath, mood changes. POSITIVE muscle aches  Objective  Blood pressure 122/70, pulse (!) 104, height 5' 1 (1.549 m), weight 155 lb (70.3 kg), SpO2 99%.   General: No apparent distress alert and oriented x3 mood and affect normal, dressed appropriately.  HEENT: Pupils equal, extraocular movements intact  Respiratory: Patient's speak in full sentences and does not appear short of breath  Cardiovascular: No lower extremity edema, non tender, no erythema  Gait relative normal MSK:  Back exam does have some loss lordosis.  Some tenderness to palpation in the paraspinal musculature.  Severe tenderness noted.  Over the lateral epicondylar area.  Patient has worsening pain with resisted extension of the wrist against resistance at the elbow.  After verbal consent patient was prepped with alcohol swab and with a 25-gauge half inch needle injected with 0.5 cc of 0.5% Marcaine  and 0.5 cc of Kenalog  40 mg/mL into the common extensor tendon at the insertion on the lateral epicondylar area.  No blood loss.  Band-Aid placed.  Postinjection instruction given.   Assessment and Plan:  Degenerative disc disease, cervical Worsening symptoms with radicular symptoms.  Differential includes a lateral epicondylitis but I think it is highly unlikely.  Has had carpal tunnel surgery previously.  At this point I do feel  that advanced imaging is warranted with that affecting daily activities as well as waking her up at night.  Patient would like to consider even the possibility of surgical intervention if findings are consistent with any nerve impingement that is contributing.  Follow-up with me again in days after the MRI to discuss if needed.       The above documentation has been reviewed and is accurate and complete Melissa [name] M  [name], DO         Note: This dictation was prepared with Dragon dictation along with smaller phrase technology. Any transcriptional errors that result from this process are unintentional.            [1]  Allergies Allergen Reactions   Bactrim  [Sulfamethoxazole -Trimethoprim ] Itching   "

## 2024-02-06 ENCOUNTER — Telehealth: Payer: Self-pay | Admitting: Family Medicine

## 2024-02-06 NOTE — Telephone Encounter (Signed)
 Patient called and stated she thinks she understood her xray and she wants to know why her leg would be hurting if the xray did not show anything. Please call patient when you can.

## 2024-02-07 ENCOUNTER — Encounter: Payer: Self-pay | Admitting: Family Medicine

## 2024-02-07 ENCOUNTER — Other Ambulatory Visit: Payer: Self-pay

## 2024-02-07 DIAGNOSIS — N62 Hypertrophy of breast: Secondary | ICD-10-CM

## 2024-02-25 ENCOUNTER — Ambulatory Visit: Admitting: Family Medicine

## 2024-03-16 ENCOUNTER — Other Ambulatory Visit

## 2024-06-17 ENCOUNTER — Ambulatory Visit
